# Patient Record
Sex: Female | Born: 1935 | Race: Black or African American | Hispanic: No | Smoking: Former smoker
Health system: Southern US, Community
[De-identification: ages and names within clinical notes are randomized; demographics above are authoritative.]

## PROBLEM LIST (undated history)

## (undated) DIAGNOSIS — I509 Heart failure, unspecified: Secondary | ICD-10-CM

## (undated) DIAGNOSIS — J449 Chronic obstructive pulmonary disease, unspecified: Secondary | ICD-10-CM

## (undated) DIAGNOSIS — I2510 Atherosclerotic heart disease of native coronary artery without angina pectoris: Secondary | ICD-10-CM

## (undated) DIAGNOSIS — N289 Disorder of kidney and ureter, unspecified: Secondary | ICD-10-CM

## (undated) DIAGNOSIS — I639 Cerebral infarction, unspecified: Secondary | ICD-10-CM

## (undated) DIAGNOSIS — I519 Heart disease, unspecified: Secondary | ICD-10-CM

## (undated) DIAGNOSIS — I10 Essential (primary) hypertension: Secondary | ICD-10-CM

## (undated) DIAGNOSIS — E119 Type 2 diabetes mellitus without complications: Secondary | ICD-10-CM

## (undated) HISTORY — PX: TONSILLECTOMY: SUR1361

## (undated) HISTORY — PX: ABDOMINAL HYSTERECTOMY: SHX81

## (undated) HISTORY — PX: CHOLECYSTECTOMY: SHX55

---

## 2012-08-10 ENCOUNTER — Emergency Department (HOSPITAL_COMMUNITY): Payer: Medicare HMO

## 2012-08-10 ENCOUNTER — Emergency Department (HOSPITAL_COMMUNITY)
Admission: EM | Admit: 2012-08-10 | Discharge: 2012-08-10 | Disposition: A | Discharge status: Home / Self Care | Payer: Medicare HMO | Source: Home / Self Care | Attending: Emergency Medicine | Admitting: Emergency Medicine

## 2012-08-10 ENCOUNTER — Encounter (HOSPITAL_COMMUNITY): Payer: Self-pay | Admitting: Emergency Medicine

## 2012-08-10 DIAGNOSIS — S12000A Unspecified displaced fracture of first cervical vertebra, initial encounter for closed fracture: Secondary | ICD-10-CM | POA: Insufficient documentation

## 2012-08-10 DIAGNOSIS — Z7982 Long term (current) use of aspirin: Secondary | ICD-10-CM | POA: Insufficient documentation

## 2012-08-10 DIAGNOSIS — IMO0002 Reserved for concepts with insufficient information to code with codable children: Secondary | ICD-10-CM | POA: Insufficient documentation

## 2012-08-10 DIAGNOSIS — I509 Heart failure, unspecified: Secondary | ICD-10-CM | POA: Insufficient documentation

## 2012-08-10 DIAGNOSIS — Y9289 Other specified places as the place of occurrence of the external cause: Secondary | ICD-10-CM | POA: Insufficient documentation

## 2012-08-10 DIAGNOSIS — J4489 Other specified chronic obstructive pulmonary disease: Secondary | ICD-10-CM | POA: Insufficient documentation

## 2012-08-10 DIAGNOSIS — I2510 Atherosclerotic heart disease of native coronary artery without angina pectoris: Secondary | ICD-10-CM | POA: Insufficient documentation

## 2012-08-10 DIAGNOSIS — Z794 Long term (current) use of insulin: Secondary | ICD-10-CM | POA: Insufficient documentation

## 2012-08-10 DIAGNOSIS — E119 Type 2 diabetes mellitus without complications: Secondary | ICD-10-CM | POA: Insufficient documentation

## 2012-08-10 DIAGNOSIS — S63502A Unspecified sprain of left wrist, initial encounter: Secondary | ICD-10-CM

## 2012-08-10 DIAGNOSIS — Y9389 Activity, other specified: Secondary | ICD-10-CM | POA: Insufficient documentation

## 2012-08-10 DIAGNOSIS — I10 Essential (primary) hypertension: Secondary | ICD-10-CM | POA: Insufficient documentation

## 2012-08-10 DIAGNOSIS — S63509A Unspecified sprain of unspecified wrist, initial encounter: Secondary | ICD-10-CM | POA: Insufficient documentation

## 2012-08-10 DIAGNOSIS — S63501A Unspecified sprain of right wrist, initial encounter: Secondary | ICD-10-CM

## 2012-08-10 DIAGNOSIS — Z8673 Personal history of transient ischemic attack (TIA), and cerebral infarction without residual deficits: Secondary | ICD-10-CM | POA: Insufficient documentation

## 2012-08-10 DIAGNOSIS — W19XXXA Unspecified fall, initial encounter: Secondary | ICD-10-CM

## 2012-08-10 DIAGNOSIS — S0081XA Abrasion of other part of head, initial encounter: Secondary | ICD-10-CM

## 2012-08-10 DIAGNOSIS — N289 Disorder of kidney and ureter, unspecified: Secondary | ICD-10-CM | POA: Insufficient documentation

## 2012-08-10 DIAGNOSIS — R296 Repeated falls: Secondary | ICD-10-CM | POA: Insufficient documentation

## 2012-08-10 DIAGNOSIS — Z87891 Personal history of nicotine dependence: Secondary | ICD-10-CM | POA: Insufficient documentation

## 2012-08-10 DIAGNOSIS — Z8679 Personal history of other diseases of the circulatory system: Secondary | ICD-10-CM | POA: Insufficient documentation

## 2012-08-10 DIAGNOSIS — J449 Chronic obstructive pulmonary disease, unspecified: Secondary | ICD-10-CM | POA: Insufficient documentation

## 2012-08-10 MED ORDER — HYDROCODONE-ACETAMINOPHEN 5-325 MG PO TABS: 0.5000 | ORAL_TABLET | Freq: Four times a day (QID) | ORAL | Status: DC | PRN

## 2012-08-10 MED ORDER — SODIUM CHLORIDE 0.9 % IV SOLN
Freq: Once | INTRAVENOUS | Status: AC
  Administered 2012-08-10: 22:00:00 via INTRAVENOUS

## 2012-08-10 MED ORDER — FENTANYL CITRATE 0.05 MG/ML IJ SOLN
50.0000 ug | Freq: Once | INTRAMUSCULAR | Status: AC
  Administered 2012-08-10: 50 ug via NASAL
  Filled 2012-08-10: qty 2

## 2012-08-10 MED ORDER — MORPHINE SULFATE 4 MG/ML IJ SOLN
4.0000 mg | Freq: Once | INTRAMUSCULAR | Status: AC
  Administered 2012-08-10: 4 mg via INTRAVENOUS
  Filled 2012-08-10: qty 1

## 2012-08-10 NOTE — ED Provider Notes (Signed)
History     CSN: [id number]  Arrival date & time 08/10/12  2123   First MD Initiated Contact with Patient 08/10/12 2127      Chief Complaint  Patient presents with  . Fall  . Head Injury  . Hand Pain    (Consider location/radiation/quality/duration/timing/severity/associated sxs/prior treatment) Patient is a 79 y.o. female presenting with fall, head injury, and hand pain.  Fall  Head Injury Hand Pain   Pt brought to the ED via EMS after unwitnessed fall in grocery store parking lot. Family at bedside states she went to put the cart back and fell down. Family found her on the ground, face down. She denies any LOC, complaining of severe bilateral hand pain, worse with movement. Also sustained abrasions to nose, wearing C-collar from EMS.  History reviewed. No pertinent past medical history.  History reviewed. No pertinent past surgical history.  History reviewed. No pertinent family history.  History  Substance Use Topics  . Smoking status: Not on file  . Smokeless tobacco: Not on file  . Alcohol Use: Not on file    OB History   Grav Para Term Preterm Abortions TAB SAB Ect Mult Living                  Review of Systems All other systems reviewed and are negative except as noted in HPI.   Allergies  Review of patient's allergies indicates no known allergies.  Home Medications  No current outpatient prescriptions on file.  BP 224/84  Pulse 117  Temp(Src) 98.1 F (36.7 C) (Oral)  Resp 20  SpO2 100%  Physical Exam  Nursing note and vitals reviewed. Constitutional: She is oriented to person, place, and time. She appears well-developed and well-nourished.  HENT:  Head: Normocephalic.  Abrasions to face  Eyes: EOM are normal. Pupils are equal, round, and reactive to light.  Neck:  In poorly fitting C-collar, replaced with philadelphia collar, no midline tenderness  Cardiovascular: Normal rate, normal heart sounds and intact distal pulses.    Pulmonary/Chest: Effort normal and breath sounds normal.  Abdominal: Bowel sounds are normal. She exhibits no distension. There is no tenderness.  Musculoskeletal: She exhibits tenderness (diffuse hand and wrist tenderness bilaterally, no swelling, ecchymosis or deformity, decreased ROM due to pain). She exhibits no edema.  Neurological: She is alert and oriented to person, place, and time. She has normal strength. No cranial nerve deficit or sensory deficit.  Skin: Skin is warm and dry. No rash noted.  Psychiatric: She has a normal mood and affect.    ED Course  Procedures (including critical care time)  Labs Reviewed  GLUCOSE, CAPILLARY - Abnormal; Notable for the following:    Glucose-Capillary 206 (*)    All other components within normal limits   Dg Wrist Complete Left  08/10/2012   *RADIOLOGY REPORT*  Clinical Data: Status post fall; bilateral wrist and hand pain.  LEFT WRIST - COMPLETE 3+ VIEW  Comparison: None.  Findings: There is no evidence of fracture or dislocation.  The carpal rows are intact, and demonstrate normal alignment.  The joint spaces are preserved.  Mild scattered vascular calcifications are seen.  IMPRESSION:  1.  No evidence of fracture or dislocation. 2.  Mild scattered vascular calcifications seen.   Original Report Authenticated By: Tonia Ghent, M.D.   Dg Wrist Complete Right  08/10/2012   *RADIOLOGY REPORT*  Clinical Data: Status post fall; bilateral wrist and hand pain.  RIGHT WRIST - COMPLETE 3+ VIEW  Comparison:  None.  Findings: There is no evidence of acute fracture or dislocation. There appears to be a chronic fracture involving the distal waist of the scaphoid, with some degree of associated bony remodelling.  The carpal rows are intact, and demonstrate normal alignment.  The joint spaces are preserved.  Mild negative ulnar variance is noted. There is mild diffuse osteopenia of visualized osseous structures.  There is minimal calcification of the triangular  fibrocartilage. Scattered mild vascular calcifications are seen.  IMPRESSION:  1.  No evidence of acute fracture or dislocation. 2.  Apparent chronic fracture involving the distal waist of the scaphoid, with some degree of associated bony remodelling. 2.  Mild diffuse osteopenia of visualized osseous structures. 3.  Scattered mild vascular calcifications seen.   Original Report Authenticated By: Tonia Ghent, M.D.   Ct Head Wo Contrast  08/10/2012   *RADIOLOGY REPORT*  Clinical Data:  Status post fall; facial injury, with abrasion at the nose.  Concern for head or cervical spine injury.  CT HEAD WITHOUT CONTRAST AND CT CERVICAL SPINE WITHOUT CONTRAST  Technique:  Multidetector CT imaging of the head and cervical spine was performed following the standard protocol without intravenous contrast.  Multiplanar CT image reconstructions of the cervical spine were also generated.  Comparison: None  CT HEAD  Findings: There is no evidence of acute infarction, mass lesion, or intra- or extra-axial hemorrhage on CT.  Mild periventricular white matter change likely reflects small vessel ischemic microangiopathy.  The posterior fossa, including the cerebellum, brainstem and fourth ventricle, is within normal limits.  The third and lateral ventricles, and basal ganglia are unremarkable in appearance.  The cerebral hemispheres are symmetric in appearance, with normal gray- white differentiation.  No mass effect or midline shift is seen.  There is no evidence of fracture; visualized osseous structures are unremarkable in appearance.  The orbits are within normal limits. The paranasal sinuses and mastoid air cells are well-aerated. Prominent soft tissue swelling is noted at the nose.  IMPRESSION:  1.  No evidence of traumatic intracranial injury or fracture. 2.  Prominent soft tissue swelling noted at the nose. 3.  Mild small vessel ischemic microangiopathy.  CT CERVICAL SPINE  Findings: There appears to be an essentially  nondisplaced fracture through the anterior arch of C1, directly anterior to the dens. This is of indeterminate age.  There is no additional evidence of fracture.  Vertebral bodies demonstrate normal height and alignment.  Mild multilevel disc space narrowing is noted throughout the cervical and upper thoracic spine, with scattered associated anterior and posterior disc osteophyte complexes, and mild underlying facet disease. Prevertebral soft tissues are within normal limits.  The thyroid gland is unremarkable in appearance.  Mild emphysema is noted at the lung apices.  Calcification is noted at the carotid bifurcations bilaterally, slightly more prominent on the right.  IMPRESSION:  1.  Essentially nondisplaced fracture through the anterior arch of C1, directly anterior to the dens.  This is of indeterminate age. 2.  No additional evidence of fracture along the cervical spine. 3.  Mild diffuse degenerative change noted along the cervical spine. 4.  Mild emphysema at the lung apices. 5.  Calcification at the carotid bifurcations bilaterally, slightly more prominent on the right.  Carotid ultrasound could be considered for further evaluation, when and as deemed clinically appropriate.  These results were called by telephone on 08/10/2012 at 10:15 p.m. to Dr. Susy Frizzle, who verbally acknowledged these results.   Original Report Authenticated By: Tonia Ghent, M.D.  Ct Cervical Spine Wo Contrast  08/10/2012   *RADIOLOGY REPORT*  Clinical Data:  Status post fall; facial injury, with abrasion at the nose.  Concern for head or cervical spine injury.  CT HEAD WITHOUT CONTRAST AND CT CERVICAL SPINE WITHOUT CONTRAST  Technique:  Multidetector CT imaging of the head and cervical spine was performed following the standard protocol without intravenous contrast.  Multiplanar CT image reconstructions of the cervical spine were also generated.  Comparison: None  CT HEAD  Findings: There is no evidence of acute  infarction, mass lesion, or intra- or extra-axial hemorrhage on CT.  Mild periventricular white matter change likely reflects small vessel ischemic microangiopathy.  The posterior fossa, including the cerebellum, brainstem and fourth ventricle, is within normal limits.  The third and lateral ventricles, and basal ganglia are unremarkable in appearance.  The cerebral hemispheres are symmetric in appearance, with normal gray- white differentiation.  No mass effect or midline shift is seen.  There is no evidence of fracture; visualized osseous structures are unremarkable in appearance.  The orbits are within normal limits. The paranasal sinuses and mastoid air cells are well-aerated. Prominent soft tissue swelling is noted at the nose.  IMPRESSION:  1.  No evidence of traumatic intracranial injury or fracture. 2.  Prominent soft tissue swelling noted at the nose. 3.  Mild small vessel ischemic microangiopathy.  CT CERVICAL SPINE  Findings: There appears to be an essentially nondisplaced fracture through the anterior arch of C1, directly anterior to the dens. This is of indeterminate age.  There is no additional evidence of fracture.  Vertebral bodies demonstrate normal height and alignment.  Mild multilevel disc space narrowing is noted throughout the cervical and upper thoracic spine, with scattered associated anterior and posterior disc osteophyte complexes, and mild underlying facet disease. Prevertebral soft tissues are within normal limits.  The thyroid gland is unremarkable in appearance.  Mild emphysema is noted at the lung apices.  Calcification is noted at the carotid bifurcations bilaterally, slightly more prominent on the right.  IMPRESSION:  1.  Essentially nondisplaced fracture through the anterior arch of C1, directly anterior to the dens.  This is of indeterminate age. 2.  No additional evidence of fracture along the cervical spine. 3.  Mild diffuse degenerative change noted along the cervical spine. 4.   Mild emphysema at the lung apices. 5.  Calcification at the carotid bifurcations bilaterally, slightly more prominent on the right.  Carotid ultrasound could be considered for further evaluation, when and as deemed clinically appropriate.  These results were called by telephone on 08/10/2012 at 10:15 p.m. to Dr. Susy Frizzle, who verbally acknowledged these results.   Original Report Authenticated By: Tonia Ghent, M.D.   Dg Hand Complete Left  08/10/2012   *RADIOLOGY REPORT*  Clinical Data: Status post fall; bilateral hand pain.  LEFT HAND - COMPLETE 3+ VIEW  Comparison: None.  Findings: There is no evidence of fracture or dislocation.  Mild joint space narrowing is suggested at the distal interphalangeal joints, with osteophyte formation, likely reflecting mild osteoarthritis.  There is mild diffuse osteopenia of visualized osseous structures.  Mild vascular calcifications are noted.  The carpal rows are intact, and demonstrate normal alignment.  IMPRESSION:  1.  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 No evidence of fracture or dislocation. 2.  Mild vascular calcifications seen.   Original Report Authenticated By: Tonia Ghent, M.D.   Dg Hand Complete Right  08/10/2012   *RADIOLOGY REPORT*  Clinical Data: Status post fall;  bilateral wrist and hand pain.  RIGHT HAND - COMPLETE 3+ VIEW  Comparison: None.  Findings: There is no evidence of acute fracture or dislocation. An apparent chronic fracture involving the distal waist of the scaphoid is better characterized on concurrent wrist radiographs. Visualized joint spaces are grossly preserved.  Mild negative ulnar variance is noted.  Mild scattered vascular calcifications are seen.  IMPRESSION:  1.  No evidence of acute fracture or dislocation. 2.  Apparent chronic fracture involving the distal waist of the scaphoid is better characterized on concurrent wrist radiographs. 3.  Mild scattered vascular calcifications seen.   Original  Report Authenticated By: Tonia Ghent, M.D.     1. Fall, initial encounter   2. Abrasion of face, initial encounter   3. C1 cervical fracture, closed, initial encounter   4. Wrist sprain, left, initial encounter   5. Wrist sprain, right, initial encounter       MDM  Reviewed imaging including C-spine findings. Discussed with Dr. Wynetta Emery on call for Neurosurgery who has also reviewed her films and recommends Aspen collar and follow up in the office. She does not have any known history of falls or spine injury. Pt placed in wrist splints for hand/wrist pain, although xrays neg for acute fracture. Plan to ambulate the patient and if tolerates walking will d/c home with family, pain meds PRN and outpatient followup.        Long Brimage B. Bernette Mayers, MD 08/10/12 [phone]

## 2012-08-10 NOTE — ED Notes (Signed)
Pt ambulated well in the hall with one-person stand-by assist.

## 2012-08-10 NOTE — ED Notes (Signed)
ZOX:[id number]<AV> Expected date:<BR> Expected time:<BR> Means of arrival:<BR> Comments:<BR> EMS 74F, Fall, Face and bilateral hand pain

## 2012-08-10 NOTE — ED Notes (Signed)
Brought in by EMS from a grocery store after her fall tonight.  Per EMS, pt was ambulating to get a grocery cart with left arm on a "cane", pt tripped over and fell forward, sustaining injury to face-- has abrasion to nose, bleeding controlled; pt also c/o right and left hand pain; pt presents to ED with c-collar on and yelling/groaning in pain; pt has had no loss of consciousness with this fall; denied dizziness.

## 2012-08-11 ENCOUNTER — Encounter (HOSPITAL_COMMUNITY): Payer: Self-pay | Admitting: Nurse Practitioner

## 2012-08-11 ENCOUNTER — Emergency Department (HOSPITAL_COMMUNITY): Payer: Medicare HMO

## 2012-08-11 ENCOUNTER — Inpatient Hospital Stay (HOSPITAL_COMMUNITY)
Admission: EM | Admit: 2012-08-11 | Discharge: 2012-08-16 | DRG: 551 | Disposition: A | Discharge status: Home Health | Payer: Medicare HMO | Attending: Family Medicine | Admitting: Family Medicine

## 2012-08-11 DIAGNOSIS — Z794 Long term (current) use of insulin: Secondary | ICD-10-CM

## 2012-08-11 DIAGNOSIS — Z9115 Patient's noncompliance with renal dialysis: Secondary | ICD-10-CM

## 2012-08-11 DIAGNOSIS — W19XXXA Unspecified fall, initial encounter: Secondary | ICD-10-CM

## 2012-08-11 DIAGNOSIS — D72829 Elevated white blood cell count, unspecified: Secondary | ICD-10-CM

## 2012-08-11 DIAGNOSIS — S12000A Unspecified displaced fracture of first cervical vertebra, initial encounter for closed fracture: Principal | ICD-10-CM

## 2012-08-11 DIAGNOSIS — Z8249 Family history of ischemic heart disease and other diseases of the circulatory system: Secondary | ICD-10-CM

## 2012-08-11 DIAGNOSIS — D631 Anemia in chronic kidney disease: Secondary | ICD-10-CM | POA: Diagnosis present

## 2012-08-11 DIAGNOSIS — M79609 Pain in unspecified limb: Secondary | ICD-10-CM

## 2012-08-11 DIAGNOSIS — Z833 Family history of diabetes mellitus: Secondary | ICD-10-CM

## 2012-08-11 DIAGNOSIS — Z87891 Personal history of nicotine dependence: Secondary | ICD-10-CM

## 2012-08-11 DIAGNOSIS — E871 Hypo-osmolality and hyponatremia: Secondary | ICD-10-CM | POA: Diagnosis present

## 2012-08-11 DIAGNOSIS — R937 Abnormal findings on diagnostic imaging of other parts of musculoskeletal system: Secondary | ICD-10-CM | POA: Diagnosis present

## 2012-08-11 DIAGNOSIS — K5900 Constipation, unspecified: Secondary | ICD-10-CM | POA: Diagnosis present

## 2012-08-11 DIAGNOSIS — R0902 Hypoxemia: Secondary | ICD-10-CM | POA: Diagnosis present

## 2012-08-11 DIAGNOSIS — N2581 Secondary hyperparathyroidism of renal origin: Secondary | ICD-10-CM | POA: Diagnosis present

## 2012-08-11 DIAGNOSIS — J9600 Acute respiratory failure, unspecified whether with hypoxia or hypercapnia: Secondary | ICD-10-CM | POA: Diagnosis present

## 2012-08-11 DIAGNOSIS — I120 Hypertensive chronic kidney disease with stage 5 chronic kidney disease or end stage renal disease: Secondary | ICD-10-CM | POA: Diagnosis present

## 2012-08-11 DIAGNOSIS — I5022 Chronic systolic (congestive) heart failure: Secondary | ICD-10-CM | POA: Diagnosis present

## 2012-08-11 DIAGNOSIS — J4489 Other specified chronic obstructive pulmonary disease: Secondary | ICD-10-CM | POA: Diagnosis present

## 2012-08-11 DIAGNOSIS — Z91158 Patient's noncompliance with renal dialysis for other reason: Secondary | ICD-10-CM

## 2012-08-11 DIAGNOSIS — I10 Essential (primary) hypertension: Secondary | ICD-10-CM | POA: Diagnosis present

## 2012-08-11 DIAGNOSIS — Z8673 Personal history of transient ischemic attack (TIA), and cerebral infarction without residual deficits: Secondary | ICD-10-CM

## 2012-08-11 DIAGNOSIS — J449 Chronic obstructive pulmonary disease, unspecified: Secondary | ICD-10-CM | POA: Diagnosis present

## 2012-08-11 DIAGNOSIS — M79642 Pain in left hand: Secondary | ICD-10-CM

## 2012-08-11 DIAGNOSIS — E663 Overweight: Secondary | ICD-10-CM | POA: Diagnosis present

## 2012-08-11 DIAGNOSIS — Z823 Family history of stroke: Secondary | ICD-10-CM

## 2012-08-11 DIAGNOSIS — N186 End stage renal disease: Secondary | ICD-10-CM | POA: Diagnosis present

## 2012-08-11 DIAGNOSIS — Z9119 Patient's noncompliance with other medical treatment and regimen: Secondary | ICD-10-CM

## 2012-08-11 DIAGNOSIS — E1129 Type 2 diabetes mellitus with other diabetic kidney complication: Secondary | ICD-10-CM | POA: Diagnosis present

## 2012-08-11 DIAGNOSIS — I509 Heart failure, unspecified: Secondary | ICD-10-CM | POA: Diagnosis present

## 2012-08-11 DIAGNOSIS — I2510 Atherosclerotic heart disease of native coronary artery without angina pectoris: Secondary | ICD-10-CM | POA: Diagnosis present

## 2012-08-11 DIAGNOSIS — E785 Hyperlipidemia, unspecified: Secondary | ICD-10-CM | POA: Diagnosis present

## 2012-08-11 DIAGNOSIS — D649 Anemia, unspecified: Secondary | ICD-10-CM

## 2012-08-11 DIAGNOSIS — Z79899 Other long term (current) drug therapy: Secondary | ICD-10-CM

## 2012-08-11 DIAGNOSIS — E875 Hyperkalemia: Secondary | ICD-10-CM | POA: Diagnosis present

## 2012-08-11 DIAGNOSIS — Z841 Family history of disorders of kidney and ureter: Secondary | ICD-10-CM

## 2012-08-11 DIAGNOSIS — N039 Chronic nephritic syndrome with unspecified morphologic changes: Secondary | ICD-10-CM | POA: Diagnosis present

## 2012-08-11 DIAGNOSIS — G959 Disease of spinal cord, unspecified: Secondary | ICD-10-CM

## 2012-08-11 DIAGNOSIS — Z91199 Patient's noncompliance with other medical treatment and regimen due to unspecified reason: Secondary | ICD-10-CM

## 2012-08-11 DIAGNOSIS — J189 Pneumonia, unspecified organism: Secondary | ICD-10-CM | POA: Diagnosis present

## 2012-08-11 HISTORY — DX: Atherosclerotic heart disease of native coronary artery without angina pectoris: I25.10

## 2012-08-11 HISTORY — DX: Heart disease, unspecified: I51.9

## 2012-08-11 HISTORY — DX: Chronic obstructive pulmonary disease, unspecified: J44.9

## 2012-08-11 HISTORY — DX: Disorder of kidney and ureter, unspecified: N28.9

## 2012-08-11 HISTORY — DX: Cerebral infarction, unspecified: I63.9

## 2012-08-11 HISTORY — DX: Heart failure, unspecified: I50.9

## 2012-08-11 HISTORY — DX: Essential (primary) hypertension: I10

## 2012-08-11 HISTORY — DX: Type 2 diabetes mellitus without complications: E11.9

## 2012-08-11 LAB — URINALYSIS, ROUTINE W REFLEX MICROSCOPIC
Leukocytes, UA: NEGATIVE
Nitrite: NEGATIVE
Specific Gravity, Urine: 1.013 (ref 1.005–1.030)
pH: 7.5 (ref 5.0–8.0)

## 2012-08-11 LAB — URINE MICROSCOPIC-ADD ON

## 2012-08-11 LAB — CBC WITH DIFFERENTIAL/PLATELET
Eosinophils Relative: 1 % (ref 0–5)
HCT: 26.9 % — ABNORMAL LOW (ref 36.0–46.0)
Hemoglobin: 8.7 g/dL — ABNORMAL LOW (ref 12.0–15.0)
Lymphocytes Relative: 14 % (ref 12–46)
Lymphs Abs: 1.9 10*3/uL (ref 0.7–4.0)
MCV: 104.3 fL — ABNORMAL HIGH (ref 78.0–100.0)
Monocytes Absolute: 0.4 10*3/uL (ref 0.1–1.0)
RBC: 2.58 MIL/uL — ABNORMAL LOW (ref 3.87–5.11)
WBC: 13.4 10*3/uL — ABNORMAL HIGH (ref 4.0–10.5)

## 2012-08-11 LAB — COMPREHENSIVE METABOLIC PANEL
Albumin: 3.1 g/dL — ABNORMAL LOW (ref 3.5–5.2)
BUN: 42 mg/dL — ABNORMAL HIGH (ref 6–23)
Creatinine, Ser: 8.61 mg/dL — ABNORMAL HIGH (ref 0.50–1.10)
Potassium: 4.3 mEq/L (ref 3.5–5.1)
Total Protein: 7.2 g/dL (ref 6.0–8.3)

## 2012-08-11 LAB — GLUCOSE, CAPILLARY: Glucose-Capillary: 141 mg/dL — ABNORMAL HIGH (ref 70–99)

## 2012-08-11 MED ORDER — MORPHINE SULFATE 2 MG/ML IJ SOLN
2.0000 mg | INTRAMUSCULAR | Status: DC | PRN
  Administered 2012-08-12: 2 mg via INTRAVENOUS
  Filled 2012-08-11: qty 1

## 2012-08-11 MED ORDER — CLONIDINE HCL 0.2 MG PO TABS
0.2000 mg | ORAL_TABLET | Freq: Two times a day (BID) | ORAL | Status: DC
  Administered 2012-08-12 – 2012-08-16 (×10): 0.2 mg via ORAL
  Filled 2012-08-11 (×11): qty 1

## 2012-08-11 MED ORDER — LEVOFLOXACIN IN D5W 500 MG/100ML IV SOLN
500.0000 mg | INTRAVENOUS | Status: AC
  Administered 2012-08-11: 500 mg via INTRAVENOUS
  Filled 2012-08-11: qty 100

## 2012-08-11 MED ORDER — GUAIFENESIN ER 600 MG PO TB12
600.0000 mg | ORAL_TABLET | Freq: Two times a day (BID) | ORAL | Status: DC
  Administered 2012-08-12 – 2012-08-16 (×9): 600 mg via ORAL
  Filled 2012-08-11 (×10): qty 1

## 2012-08-11 MED ORDER — SODIUM CHLORIDE 0.9 % IJ SOLN: 3.0000 mL | INTRAMUSCULAR | Status: DC | PRN

## 2012-08-11 MED ORDER — SODIUM CHLORIDE 0.9 % IJ SOLN
3.0000 mL | Freq: Two times a day (BID) | INTRAMUSCULAR | Status: DC
  Administered 2012-08-12 – 2012-08-16 (×6): 3 mL via INTRAVENOUS

## 2012-08-11 MED ORDER — SODIUM CHLORIDE 0.9 % IV SOLN: 250.0000 mL | INTRAVENOUS | Status: DC | PRN

## 2012-08-11 MED ORDER — OXYCODONE-ACETAMINOPHEN 5-325 MG PO TABS
1.0000 | ORAL_TABLET | Freq: Once | ORAL | Status: AC
  Administered 2012-08-11: 1 via ORAL
  Filled 2012-08-11: qty 1

## 2012-08-11 MED ORDER — ONDANSETRON 4 MG PO TBDP
4.0000 mg | ORAL_TABLET | Freq: Once | ORAL | Status: AC
Start: 2012-08-11 — End: 2012-08-11
  Administered 2012-08-11: 4 mg via ORAL
  Filled 2012-08-11: qty 1

## 2012-08-11 MED ORDER — DEXTROSE 5 % IV SOLN: 1.0000 g | Freq: Three times a day (TID) | INTRAVENOUS | Status: DC

## 2012-08-11 MED ORDER — PANTOPRAZOLE SODIUM 40 MG PO TBEC
40.0000 mg | DELAYED_RELEASE_TABLET | Freq: Every day | ORAL | Status: DC
  Administered 2012-08-12 – 2012-08-16 (×5): 40 mg via ORAL
  Filled 2012-08-11 (×5): qty 1

## 2012-08-11 MED ORDER — FUROSEMIDE 80 MG PO TABS
80.0000 mg | ORAL_TABLET | Freq: Two times a day (BID) | ORAL | Status: DC
  Administered 2012-08-12 – 2012-08-15 (×6): 80 mg via ORAL
  Filled 2012-08-11 (×9): qty 1

## 2012-08-11 MED ORDER — FENTANYL CITRATE 0.05 MG/ML IJ SOLN
50.0000 ug | Freq: Once | INTRAMUSCULAR | Status: AC
  Administered 2012-08-11: 50 ug via INTRAVENOUS
  Filled 2012-08-11: qty 2

## 2012-08-11 MED ORDER — AMLODIPINE BESYLATE 5 MG PO TABS
5.0000 mg | ORAL_TABLET | Freq: Every day | ORAL | Status: DC
  Administered 2012-08-12 – 2012-08-16 (×5): 5 mg via ORAL
  Filled 2012-08-11 (×5): qty 1

## 2012-08-11 MED ORDER — INSULIN ASPART 100 UNIT/ML ~~LOC~~ SOLN
0.0000 [IU] | SUBCUTANEOUS | Status: DC
  Administered 2012-08-12: 2 [IU] via SUBCUTANEOUS

## 2012-08-11 MED ORDER — CALCIUM ACETATE 667 MG PO CAPS
1334.0000 mg | ORAL_CAPSULE | Freq: Every day | ORAL | Status: DC
  Administered 2012-08-12 – 2012-08-15 (×4): 1334 mg via ORAL
  Filled 2012-08-11 (×6): qty 2

## 2012-08-11 MED ORDER — SIMVASTATIN 20 MG PO TABS
20.0000 mg | ORAL_TABLET | Freq: Every day | ORAL | Status: DC
  Administered 2012-08-12 – 2012-08-15 (×4): 20 mg via ORAL
  Filled 2012-08-11 (×5): qty 1

## 2012-08-11 MED ORDER — ACETAMINOPHEN 500 MG PO TABS
500.0000 mg | ORAL_TABLET | Freq: Four times a day (QID) | ORAL | Status: DC | PRN
  Filled 2012-08-11: qty 1

## 2012-08-11 MED ORDER — INSULIN GLARGINE 100 UNIT/ML ~~LOC~~ SOLN
16.0000 [IU] | Freq: Every day | SUBCUTANEOUS | Status: DC
  Administered 2012-08-12 – 2012-08-15 (×5): 16 [IU] via SUBCUTANEOUS
  Filled 2012-08-11 (×6): qty 0.16

## 2012-08-11 MED ORDER — IPRATROPIUM BROMIDE 0.02 % IN SOLN: 0.5000 mg | Freq: Four times a day (QID) | RESPIRATORY_TRACT | Status: DC

## 2012-08-11 MED ORDER — ALBUTEROL SULFATE (5 MG/ML) 0.5% IN NEBU
5.0000 mg | INHALATION_SOLUTION | Freq: Once | RESPIRATORY_TRACT | Status: AC
  Administered 2012-08-11: 5 mg via RESPIRATORY_TRACT
  Filled 2012-08-11: qty 1

## 2012-08-11 MED ORDER — TRAMADOL HCL 50 MG PO TABS
50.0000 mg | ORAL_TABLET | Freq: Three times a day (TID) | ORAL | Status: DC | PRN
  Administered 2012-08-12 – 2012-08-16 (×4): 50 mg via ORAL
  Filled 2012-08-11 (×5): qty 1

## 2012-08-11 MED ORDER — GADOBENATE DIMEGLUMINE 529 MG/ML IV SOLN: 15.0000 mL | Freq: Once | INTRAVENOUS | Status: DC

## 2012-08-11 MED ORDER — METHYLPREDNISOLONE SODIUM SUCC 125 MG IJ SOLR
125.0000 mg | Freq: Once | INTRAMUSCULAR | Status: AC
  Administered 2012-08-11: 125 mg via INTRAVENOUS
  Filled 2012-08-11: qty 2

## 2012-08-11 MED ORDER — ALBUTEROL SULFATE (5 MG/ML) 0.5% IN NEBU: 2.5000 mg | INHALATION_SOLUTION | RESPIRATORY_TRACT | Status: DC | PRN

## 2012-08-11 MED ORDER — ASPIRIN EC 81 MG PO TBEC
81.0000 mg | DELAYED_RELEASE_TABLET | Freq: Every day | ORAL | Status: DC
  Administered 2012-08-12 – 2012-08-16 (×5): 81 mg via ORAL
  Filled 2012-08-11 (×5): qty 1

## 2012-08-11 NOTE — ED Notes (Signed)
Called report to unit 3000.

## 2012-08-11 NOTE — ED Notes (Signed)
Pt nonn-compliant to stand for orthostatics.  Lying and sitting were performed

## 2012-08-11 NOTE — ED Notes (Signed)
Pt was seen last night at [facility] after a fall, diagnosed with bilateral wrist sprains and fx neck and discharged home with tramadol. Family states pt was in severe pain throughout the night last night and pain was unrelieved with tramadol. Family states pt was unable to even take care of herself today d/t pain

## 2012-08-11 NOTE — ED Notes (Signed)
Patient has a c-collar in place, abrasion to her nose and upper lip, bilateral wrist braces in place.

## 2012-08-11 NOTE — ED Notes (Signed)
Maxine Glenn (daughter) - [phone]

## 2012-08-11 NOTE — ED Notes (Addendum)
Pt also missed dialysis today. Family states dialysis could not take her "in so much pain." dialysis center did say they will see her later today or tomorrow am

## 2012-08-11 NOTE — H&P (Signed)
PCP: Joana Reamer, MD  [facility] - renal dr. Mauri Reading  Chief Complaint:  Hand pain  HPI: Nicole Cox is a 79 y.o. female   has a past medical history of Renal disorder; Diabetes mellitus without complication; Hypertension; Heart disease; COPD (chronic obstructive pulmonary disease); Stroke; CHF (congestive heart failure); and Coronary artery disease.   Presented with  Yesterday patient had a fall. Denies LOS, she fell on her face and tried to brace her self with her arms. Patient denies feeling lightheaded not sure why she fell. Denies tripping. She was brought to [facility] and was found to have CI fracture this was called in to NS who recommended follow up as an outpatient. Her hands were imaged yesterday and no evidence of fracture was found. Patient was sent home with ultram but could not sleep because of pain in her hands and face. She usually has some tingling in hands bilaterally but this pain is different and more severe.she states everything hurts even minor touch to her hands causes severe pain. Patient states that her thumbs are the most painful.  She felt so bad that she did not go to her HD. Normally she gets dialyzed Tuesday,Thursday and Saturday at [facility] but family wishes for her care to be transferred here. She still makes urine. When patient came in to [facility] ED today she had further work up including MRI of C-spine and thoracic spine. C-spine did not show any evidence of cord compression but MRI of thoracic spine was worrisome for a lesion at the level of T4-T5 possibly secondary to chronic ischemiaacute infarct or malignancy less likely. NS consultation has been called by ER patient was supposed to follow up as an outpatient.  Neurology consultation has been called and patient was supposed to be also followed up as an outpatient. She was incidentally found to be hypoxic down to 80% and with increased wheezing. She was given IV steroids and nebulizes with good results. But continued  to be hypoxic, CXR was worrisome for possible PNA vs atypical edema.   She denies any cough or fever, no chest pain. She does endorse feeling shortness of breath that is at baseline.  Denies any weight loss. Family unsure when was her last mammogram. She may have had a colonoscopy with in past 10 years.   Review of Systems:    Pertinent positives include: shortness of breath at rest. dyspnea on exertion, non-productive cough,  Constitutional:  No weight loss, night sweats, Fevers, chills, fatigue, weight loss  HEENT:  No headaches, Difficulty swallowing,Tooth/dental problems,Sore throat,  No sneezing, itching, ear ache, nasal congestion, post nasal drip,  Cardio-vascular:  No chest pain, Orthopnea, PND, anasarca, dizziness, palpitations.no Bilateral lower extremity swelling  GI:  No heartburn, indigestion, abdominal pain, nausea, vomiting, diarrhea, change in bowel habits, loss of appetite, melena, blood in stool, hematemesis Resp:  No excess mucus  No coughing up of blood.No change in color of mucus.No wheezing. Skin:  no rash or lesions. No jaundice GU:  no dysuria, change in color of urine, no urgency or frequency. No straining to urinate.  No flank pain.  Musculoskeletal:  No joint pain or no joint swelling. No decreased range of motion. No back pain.  Psych:  No change in mood or affect. No depression or anxiety. No memory loss.  Neuro: no localizing neurological complaints, no tingling, no weakness, no double vision, no gait abnormality, no slurred speech, no confusion  Otherwise ROS are negative except for above, 10 systems were reviewed  Past Medical History: Past Medical History  Diagnosis Date  . Renal disorder   . Diabetes mellitus without complication   . Hypertension   . Heart disease   . COPD (chronic obstructive pulmonary disease)   . Stroke   . CHF (congestive heart failure)   . Coronary artery disease    Past Surgical History  Procedure Laterality Date   . Abdominal hysterectomy    . Cholecystectomy    . Tonsillectomy       Medications: Prior to Admission medications   Medication Sig Start Date End Date Taking? Authorizing Provider  acetaminophen (TYLENOL) 500 MG tablet Take 500 mg by mouth every 6 (six) hours as needed for pain.   Yes Historical Provider, MD  amLODipine (NORVASC) 5 MG tablet Take 5 mg by mouth daily.   Yes Historical Provider, MD  aspirin EC 81 MG tablet Take 81 mg by mouth daily.   Yes Historical Provider, MD  calcium acetate (PHOSLO) 667 MG capsule Take 1,334 mg by mouth daily.   Yes Historical Provider, MD  cloNIDine (CATAPRES) 0.2 MG tablet Take 0.2 mg by mouth 2 (two) times daily.   Yes Historical Provider, MD  furosemide (LASIX) 80 MG tablet Take 80 mg by mouth 2 (two) times daily. One every other day   Yes Historical Provider, MD  insulin aspart (NOVOLOG) 100 UNIT/ML injection Inject 6 Units into the skin 3 (three) times daily with meals.   Yes Historical Provider, MD  insulin glargine (LANTUS) 100 UNIT/ML injection Inject 16 Units into the skin at bedtime.   Yes Historical Provider, MD  pravastatin (PRAVACHOL) 40 MG tablet Take 40 mg by mouth daily.   Yes Historical Provider, MD  PRESCRIPTION MEDICATION Inhale 2 puffs into the lungs daily as needed (inhailer).   Yes Historical Provider, MD  traMADol (ULTRAM) 50 MG tablet Take 50 mg by mouth every 8 (eight) hours as needed for pain.    Yes Historical Provider, MD  Vitamin D, Ergocalciferol, (DRISDOL) 50000 UNITS CAPS Take 50,000 Units by mouth daily.   Yes Historical Provider, MD    Allergies:  No Known Allergies  Social History:  Ambulatory with  [facility]   Home alone but now lives in [place] closer to her daughter.    reports that she has quit smoking. She does not have any smokeless tobacco history on file. She reports that she does not drink alcohol or use illicit drugs.   Family History: family history includes Diabetes in her sister; Heart  disease in her father, mother, and sisters; Hypertension in her daughter, father, mother, and sisters; Kidney disease in her daughter and sisters; and Stroke in her sister.    Physical Exam: Patient Vitals for the past 24 hrs:  BP Temp Temp src Pulse Resp SpO2  08/11/12 2015 167/67 mmHg - - 96 - 100 %  08/11/12 2000 168/65 mmHg - - 101 - 100 %  08/11/12 1945 182/70 mmHg - - 103 - 100 %  08/11/12 1930 166/70 mmHg - - 96 - 100 %  08/11/12 1915 189/67 mmHg - - 95 - 99 %  08/11/12 1900 177/70 mmHg - - 96 - 99 %  08/11/12 1845 187/65 mmHg - - 94 - 97 %  08/11/12 1830 174/65 mmHg - - 99 - 86 %  08/11/12 1815 167/69 mmHg - - 95 - 93 %  08/11/12 1800 146/79 mmHg - - 87 - 100 %  08/11/12 1745 152/56 mmHg - - 87 - 100 %  08/11/12 1730 147/62 mmHg - - 88 - 99 %  08/11/12 1715 144/57 mmHg - - 87 - 100 %  08/11/12 1700 147/59 mmHg - - 85 - 100 %  08/11/12 1645 127/63 mmHg - - 84 - 99 %  08/11/12 1630 150/66 mmHg - - 80 - 99 %  08/11/12 1615 134/56 mmHg - - 84 - 100 %  08/11/12 1551 - - - - - 99 %  08/11/12 1545 140/56 mmHg - - 81 - 99 %  08/11/12 1530 148/50 mmHg - - 83 - 100 %  08/11/12 1529 153/51 mmHg - - 95 16 -  08/11/12 1528 165/55 mmHg - - 92 - -  08/11/12 1527 153/51 mmHg - - 95 - -  08/11/12 1230 149/60 mmHg - - 85 15 89 %  08/11/12 1215 152/59 mmHg - - 88 - 97 %  08/11/12 1200 168/56 mmHg - - 87 - 99 %  08/11/12 1145 152/56 mmHg - - 80 - 93 %  08/11/12 1130 168/63 mmHg - - 87 - 96 %  08/11/12 1122 169/54 mmHg 98 F (36.7 C) Oral 89 16 93 %  08/11/12 1038 170/59 mmHg 98.2 F (36.8 C) Oral 93 17 91 %    1. General:  in No Acute distress 2. Psychological: Alert and  Oriented 3. Head/ENT:   Moist   Mucous Membranes                          Head Non traumatic, neck supple                          Normal  Dentition 4. SKIN: normal  Skin turgor,  Skin clean Dry and intact no rash 5. Heart: Regular rate and rhythm no Murmur, Rub or gallop 6. Lungs: Clear to auscultation  bilaterally, no wheezes or crackles   7. Abdomen: Soft, non-tender, Non distended 8. Lower extremities: no clubbing, cyanosis, or edema 9. Neurologically: cranial nerves II through XII intact strength appears to be intact but the patient states she has severe pain at any but he touches her hands. She does move both hands spontaneously. 10. MSK: Normal range of motion  body mass index is unknown because there is no height or weight on file.   Labs on Admission:   Recent Labs  08/11/12 1508  NA 135  K 4.3  CL 94*  CO2 26  GLUCOSE 111*  BUN 42*  CREATININE 8.61*  CALCIUM 9.6    Recent Labs  08/11/12 1508  AST 29  ALT 28  ALKPHOS 87  BILITOT 0.3  PROT 7.2  ALBUMIN 3.1*   No results found for this basename: LIPASE, AMYLASE,  in the last 72 hours  Recent Labs  08/11/12 1836  WBC 13.4*  NEUTROABS 10.8*  HGB 8.7*  HCT 26.9*  MCV 104.3*  PLT 280   No results found for this basename: CKTOTAL, CKMB, CKMBINDEX, TROPONINI,  in the last 72 hours No results found for this basename: TSH, T4TOTAL, FREET3, T3FREE, THYROIDAB,  in the last 72 hours No results found for this basename: VITAMINB12, FOLATE, FERRITIN, TIBC, IRON, RETICCTPCT,  in the last 72 hours No results found for this basename: HGBA1C    CrCl is unknown because there is no height on file for the current visit. ABG No results found for this basename: phart, pco2, po2, hco3, tco2, acidbasedef, o2sat     No results found  for this basename: DDIMER     Other results:  I have pearsonaly reviewed this: ECG REPORT  Rate: 89  Rhythm: NSR with PAC ST&T Change: no ischemic changes  UA no evidence of infection   Cultures: No results found for this basename: sdes, specrequest, cult, reptstatus       Radiological Exams on Admission: Dg Wrist Complete Left  08/10/2012   *RADIOLOGY REPORT*  Clinical Data: Status post fall; bilateral wrist and hand pain.  LEFT WRIST - COMPLETE 3+ VIEW  Comparison: None.   Findings: There is no evidence of fracture or dislocation.  The carpal rows are intact, and demonstrate normal alignment.  The joint spaces are preserved.  Mild scattered vascular calcifications are seen.  IMPRESSION:  1.  No evidence of fracture or dislocation. 2.  Mild scattered vascular calcifications seen.   Original Report Authenticated By: Tonia Ghent, M.D.   Dg Wrist Complete Right  08/10/2012   *RADIOLOGY REPORT*  Clinical Data: Status post fall; bilateral wrist and hand pain.  RIGHT WRIST - COMPLETE 3+ VIEW  Comparison: None.  Findings: There is no evidence of acute fracture or dislocation. There appears to be a chronic fracture involving the distal waist of the scaphoid, with some degree of associated bony remodelling.  The carpal rows are intact, and demonstrate normal alignment.  The joint spaces are preserved.  Mild negative ulnar variance is noted. There is mild diffuse osteopenia of visualized osseous structures.  There is minimal calcification of the triangular fibrocartilage. Scattered mild vascular calcifications are seen.  IMPRESSION:  1.  No evidence of acute fracture or dislocation. 2.  Apparent chronic fracture involving the distal waist of the scaphoid, with some degree of associated bony remodelling. 2.  Mild diffuse osteopenia of visualized osseous structures. 3.  Scattered mild vascular calcifications seen.   Original Report Authenticated By: Tonia Ghent, M.D.   Ct Head Wo Contrast  08/10/2012   *RADIOLOGY REPORT*  Clinical Data:  Status post fall; facial injury, with abrasion at the nose.  Concern for head or cervical spine injury.  CT HEAD WITHOUT CONTRAST AND CT CERVICAL SPINE WITHOUT CONTRAST  Technique:  Multidetector CT imaging of the head and cervical spine was performed following the standard protocol without intravenous contrast.  Multiplanar CT image reconstructions of the cervical spine were also generated.  Comparison: None  CT HEAD  Findings: There is no evidence of  acute infarction, mass lesion, or intra- or extra-axial hemorrhage on CT.  Mild periventricular white matter change likely reflects small vessel ischemic microangiopathy.  The posterior fossa, including the cerebellum, brainstem and fourth ventricle, is within normal limits.  The third and lateral ventricles, and basal ganglia are unremarkable in appearance.  The cerebral hemispheres are symmetric in appearance, with normal gray- white differentiation.  No mass effect or midline shift is seen.  There is no evidence of fracture; visualized osseous structures are unremarkable in appearance.  The orbits are within normal limits. The paranasal sinuses and mastoid air cells are well-aerated. Prominent soft tissue swelling is noted at the nose.  IMPRESSION:  1.  No evidence of traumatic intracranial injury or fracture. 2.  Prominent soft tissue swelling noted at the nose. 3.  Mild small vessel ischemic microangiopathy.  CT CERVICAL SPINE  Findings: There appears to be an essentially nondisplaced fracture through the anterior arch of C1, directly anterior to the dens. This is of indeterminate age.  There is no additional evidence of fracture.  Vertebral bodies demonstrate normal height and alignment.  Mild multilevel disc space narrowing is noted throughout the cervical and upper thoracic spine, with scattered associated anterior and posterior disc osteophyte complexes, and mild underlying facet disease. Prevertebral soft tissues are within normal limits.  The thyroid gland is unremarkable in appearance.  Mild emphysema is noted at the lung apices.  Calcification is noted at the carotid bifurcations bilaterally, slightly more prominent on the right.  IMPRESSION:  1.  Essentially nondisplaced fracture through the anterior arch of C1, directly anterior to the dens.  This is of indeterminate age. 2.  No additional evidence of fracture along the cervical spine. 3.  Mild diffuse degenerative change noted along the cervical  spine. 4.  Mild emphysema at the lung apices. 5.  Calcification at the carotid bifurcations bilaterally, slightly more prominent on the right.  Carotid ultrasound could be considered for further evaluation, when and as deemed clinically appropriate.  These results were called by telephone on 08/10/2012 at 10:15 p.m. to Dr. Susy Frizzle, who verbally acknowledged these results.   Original Report Authenticated By: Tonia Ghent, M.D.   Ct Cervical Spine Wo Contrast  08/10/2012   *RADIOLOGY REPORT*  Clinical Data:  Status post fall; facial injury, with abrasion at the nose.  Concern for head or cervical spine injury.  CT HEAD WITHOUT CONTRAST AND CT CERVICAL SPINE WITHOUT CONTRAST  Technique:  Multidetector CT imaging of the head and cervical spine was performed following the standard protocol without intravenous contrast.  Multiplanar CT image reconstructions of the cervical spine were also generated.  Comparison: None  CT HEAD  Findings: There is no evidence of acute infarction, mass lesion, or intra- or extra-axial hemorrhage on CT.  Mild periventricular white matter change likely reflects small vessel ischemic microangiopathy.  The posterior fossa, including the cerebellum, brainstem and fourth ventricle, is within normal limits.  The third and lateral ventricles, and basal ganglia are unremarkable in appearance.  The cerebral hemispheres are symmetric in appearance, with normal gray- white differentiation.  No mass effect or midline shift is seen.  There is no evidence of fracture; visualized osseous structures are unremarkable in appearance.  The orbits are within normal limits. The paranasal sinuses and mastoid air cells are well-aerated. Prominent soft tissue swelling is noted at the nose.  IMPRESSION:  1.  No evidence of traumatic intracranial injury or fracture. 2.  Prominent soft tissue swelling noted at the nose. 3.  Mild small vessel ischemic microangiopathy.  CT CERVICAL SPINE  Findings: There  appears to be an essentially nondisplaced fracture through the anterior arch of C1, directly anterior to the dens. This is of indeterminate age.  There is no additional evidence of fracture.  Vertebral bodies demonstrate normal height and alignment.  Mild multilevel disc space narrowing is noted throughout the cervical and upper thoracic spine, with scattered associated anterior and posterior disc osteophyte complexes, and mild underlying facet disease. Prevertebral soft tissues are within normal limits.  The thyroid gland is unremarkable in appearance.  Mild emphysema is noted at the lung apices.  Calcification is noted at the carotid bifurcations bilaterally, slightly more prominent on the right.  IMPRESSION:  1.  Essentially nondisplaced fracture through the anterior arch of C1, directly anterior to the dens.  This is of indeterminate age. 2.  No additional evidence of fracture along the cervical spine. 3.  Mild diffuse degenerative change noted along the cervical spine. 4.  Mild emphysema at the lung apices. 5.  Calcification at the carotid bifurcations bilaterally, slightly more prominent on the right.  Carotid ultrasound could be considered for further evaluation, when and as deemed clinically appropriate.  These results were called by telephone on 08/10/2012 at 10:15 p.m. to Dr. Susy Frizzle, who verbally acknowledged these results.   Original Report Authenticated By: Tonia Ghent, M.D.   Mr Cervical Spine Wo Contrast  08/11/2012   *RADIOLOGY REPORT*  Clinical Data:  Fall.  Bilateral arm and hand pain.  Dialysis patient.  MRI CERVICAL AND THORACIC SPINE WITHOUT CONTRAST  Technique:  Multiplanar and multiecho pulse sequences of the cervical spine, to include the craniocervical junction and cervicothoracic junction, and the thoracic spine, were obtained without intravenous contrast.  Comparison:  CT cervical spine 08/10/2012  MRI CERVICAL SPINE  Findings:  Negative for fracture of the cervical spine.  No  mass or edema.  Bone marrow is heterogeneous without focal mass lesion. This is most compatible  with chronic anemia and dialysis.  There is edema of the thoracic spinal cord therefore the thoracic spine was also scanned.  C2-3:  Mild disc degeneration.  C3-4:  Moderate disc degeneration and spondylosis.  There is moderate spinal stenosis and bilateral foraminal encroachment.  C4-5:  Disc degeneration and spondylosis with moderate to severe spinal stenosis.  There is foraminal stenosis bilaterally.  C5-6:  Disc degeneration and spondylosis with moderate spinal stenosis and foraminal encroachment bilaterally.  C6-7:  Disc degeneration and spondylosis with mild spinal stenosis  C7-T1:  Moderate disc degeneration without significant spinal stenosis.  There is left foraminal narrowing due to spurring.  IMPRESSION: Advanced cervical spondylosis with multilevel spinal stenosis, most severe at C3-4, C4-5, and C5-6.  Abnormal signal in the thoracic spinal cord, see separate thoracic MRI report.  MRI THORACIC SPINE  Findings: Abnormality within the spinal cord at the T4-5 level. There is a focal area of increased signal within the cord at the T4 level   measuring one vertebral height in  length.  Just below this is a focal area of narrowing of the cord suggesting atrophy.  No syrinx.  Contrast was not   administered due to dialysis.  No cord compression.  No vertebral mass or fracture is seen in this area.  Multilevel thoracic disc degeneration and spondylosis diffusely in the thoracic spine.  This is not causing significant spinal stenosis.  No acute disc protrusion.  Small pleural effusions bilaterally.  IMPRESSION: Abnormal spinal cord at the T4-5 level.  This could be due to chronic ischemia.  Acute infarct considered less likely. Underlying neoplastic process such as metastatic disease considered unlikely.  Primary cord tumor not considered likely.  Acute or chronic transverse myelitis also a consideration.   Original  Report Authenticated By: Janeece Riggers, M.D.   Mr Thoracic Spine Wo Contrast  08/11/2012   *RADIOLOGY REPORT*  Clinical Data:  Fall.  Bilateral arm and hand pain.  Dialysis patient.  MRI CERVICAL AND THORACIC SPINE WITHOUT CONTRAST  Technique:  Multiplanar and multiecho pulse sequences of the cervical spine, to include the craniocervical junction and cervicothoracic junction, and the thoracic spine, were obtained without intravenous contrast.  Comparison:  CT cervical spine 08/10/2012  MRI CERVICAL SPINE  Findings:  Negative for fracture of the cervical spine.  No mass or edema.  Bone marrow is heterogeneous without focal mass lesion. This is most compatible  with chronic anemia and dialysis.  There is edema of the thoracic spinal cord therefore the thoracic spine was also scanned.  C2-3:  Mild disc degeneration.  C3-4:  Moderate disc degeneration and spondylosis.  There is moderate  spinal stenosis and bilateral foraminal encroachment.  C4-5:  Disc degeneration and spondylosis with moderate to severe spinal stenosis.  There is foraminal stenosis bilaterally.  C5-6:  Disc degeneration and spondylosis with moderate spinal stenosis and foraminal encroachment bilaterally.  C6-7:  Disc degeneration and spondylosis with mild spinal stenosis  C7-T1:  Moderate disc degeneration without significant spinal stenosis.  There is left foraminal narrowing due to spurring.  IMPRESSION: Advanced cervical spondylosis with multilevel spinal stenosis, most severe at C3-4, C4-5, and C5-6.  Abnormal signal in the thoracic spinal cord, see separate thoracic MRI report.  MRI THORACIC SPINE  Findings: Abnormality within the spinal cord at the T4-5 level. There is a focal area of increased signal within the cord at the T4 level   measuring one vertebral height in  length.  Just below this is a focal area of narrowing of the cord suggesting atrophy.  No syrinx.  Contrast was not   administered due to dialysis.  No cord compression.  No  vertebral mass or fracture is seen in this area.  Multilevel thoracic disc degeneration and spondylosis diffusely in the thoracic spine.  This is not causing significant spinal stenosis.  No acute disc protrusion.  Small pleural effusions bilaterally.  IMPRESSION: Abnormal spinal cord at the T4-5 level.  This could be due to chronic ischemia.  Acute infarct considered less likely. Underlying neoplastic process such as metastatic disease considered unlikely.  Primary cord tumor not considered likely.  Acute or chronic transverse myelitis also a consideration.   Original Report Authenticated By: Janeece Riggers, M.D.   Dg Chest Port 1 View  08/11/2012   *RADIOLOGY REPORT*  Clinical Data: Wheezing.  COPD.  Diabetes.  PORTABLE CHEST - 1 VIEW  Comparison: None.  Findings: Moderate cardiomegaly noted with bilateral indistinctness of pulmonary vasculature, interstitial accentuation, and patchy airspace opacity at the right lung base.  Bilateral small pleural effusions are present, right greater than left.  Thoracic spondylosis noted.  Degenerative glenohumeral arthropathy observed bilaterally. .  IMPRESSION: 1.  Cardiomegaly with interstitial accentuation and patchy airspace opacity at the right lung base.  Appearance favors asymmetric edema although edema with superimposed right basilar pneumonia is not excluded.  2.  Small pleural effusions, right greater than left   Original Report Authenticated By: Gaylyn Rong, M.D.   Dg Hand Complete Left  08/10/2012   *RADIOLOGY REPORT*  Clinical Data: Status post fall; bilateral hand pain.  LEFT HAND - COMPLETE 3+ VIEW  Comparison: None.  Findings: There is no evidence of fracture or dislocation.  Mild joint space narrowing is suggested at the distal interphalangeal joints, with osteophyte formation, likely reflecting mild osteoarthritis.  There is mild diffuse osteopenia of visualized osseous structures.  Mild vascular calcifications are noted.  The carpal rows are intact,  and demonstrate normal alignment.  IMPRESSION:  1.  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 No evidence of fracture or dislocation. 2.  Mild vascular calcifications seen.   Original Report Authenticated By: Tonia Ghent, M.D.   Dg Hand Complete Right  08/10/2012   *RADIOLOGY REPORT*  Clinical Data: Status post fall; bilateral wrist and hand pain.  RIGHT HAND - COMPLETE 3+ VIEW  Comparison: None.  Findings: There is no evidence of acute fracture or dislocation. An apparent chronic fracture involving the distal waist of the scaphoid is better characterized on concurrent wrist radiographs. Visualized joint spaces are grossly preserved.  Mild negative ulnar variance is noted.  Mild scattered vascular calcifications are seen.  IMPRESSION:  1.  No evidence of acute fracture or dislocation. 2.  Apparent chronic fracture involving the distal waist of the scaphoid is better characterized on concurrent wrist radiographs. 3.  Mild scattered vascular calcifications seen.   Original Report Authenticated By: Tonia Ghent, M.D.    Chart has been reviewed  Assessment/Plan  79 year old female with history of end-stage renal disease who had had a fall yesterday C1 fracture in ER was found to be hypoxic with evidence of possible pneumonia on  chest x-ray.  Present on Admission:  . HCAP (healthcare-associated pneumonia) - will initiate IV antibiotics admit as an inpatient. Blood cultures if  becomes febrile. Marland Kitchen Hypoxia - likely multifactorial patient had not had had had hemodialysis as she was supposed to. Also had initial wheezing and a history of COPD now resolved. Also has abnormal chest x-ray suspicious for pneumonia. Will continue on oxygen . ESRD (end stage renal disease) - Will need to consult renal to inform the patient is here. She'll likely need dialysis tomorrow morning. Marland Kitchen Hypertension - continue home medications . Diabetes mellitus - continue sliding scale and insulin . Abnormal  MRI, thoracic spine - neurology has been notified. MRI is not consistent with an acute lesion but father workup will be needed. Marland Kitchen COPD (chronic obstructive pulmonary disease) - will continue nebulizer treatments, antibiotics, and Mucinex currently no evidence of wheezing we'll hold off on steroids as she may have active infectious disease. Marland Kitchen CAD (coronary artery disease) - denies any chest pain we'll continue home medications.   Prophylaxis: SCD, Protonix  CODE STATUS: FULL CODE  Other plan as per orders.  I have spent a total of 65 min on this admission  Wyn Nettle 08/11/2012, 8:21 PM

## 2012-08-11 NOTE — ED Notes (Addendum)
Patient was having difficulty keeping her oxygen sats above 90%. Placed patient on 100% NRB to get her O2 up. Placed patient on 4L [place] to maintain sats >95%. MD made aware.

## 2012-08-11 NOTE — ED Provider Notes (Signed)
History     CSN: [id number]  Arrival date & time 08/11/12  1017   First MD Initiated Contact with Patient 08/11/12 1114      Chief Complaint  Patient presents with  . Neck Pain    (Consider location/radiation/quality/duration/timing/severity/associated sxs/prior treatment) HPI Pt with fall yesterday from ground height diagnosed with c-spine fracture and bl wrist sprains. Sent home with ultram and aspen collar after consultation with Dr Wynetta Emery. Pt states she has had ongoing pain to bl hands coming in waves described as pins and needles. Pain is much worse in L hand. No swelling. No deformity. Pain is uncontrolled by ultram. Pt with night time bl hand paraesthesias before fall but pain worse since fall. Pt states she only has mild neg pain which is controlled. No new numbness or weakness. No leg weakness or swelling.  Past Medical History  Diagnosis Date  . Renal disorder   . Diabetes mellitus without complication   . Hypertension   . Heart disease   . COPD (chronic obstructive pulmonary disease)   . Stroke   . CHF (congestive heart failure)   . Coronary artery disease     Past Surgical History  Procedure Laterality Date  . Abdominal hysterectomy    . Cholecystectomy    . Tonsillectomy      Family History  Problem Relation Age of Onset  . Heart disease Mother   . Hypertension Mother   . Heart disease Father   . Hypertension Father   . Heart disease Sister   . Diabetes Sister   . Stroke Sister   . Kidney disease Sister   . Hypertension Sister   . Heart disease Sister   . Kidney disease Sister   . Hypertension Sister   . Kidney disease Daughter   . Hypertension Daughter     History  Substance Use Topics  . Smoking status: Former Games developer  . Smokeless tobacco: Not on file  . Alcohol Use: No    OB History   Grav Para Term Preterm Abortions TAB SAB Ect Mult Living                  Review of Systems  Constitutional: Negative for fever and chills.  HENT:  Positive for neck pain. Negative for neck stiffness.   Respiratory: Negative for shortness of breath.   Cardiovascular: Negative for chest pain.  Gastrointestinal: Negative for nausea, vomiting and abdominal pain.  Musculoskeletal: Positive for myalgias. Negative for back pain.  Skin: Negative for rash and wound.  Neurological: Negative for dizziness, syncope, weakness, light-headedness, numbness and headaches.  All other systems reviewed and are negative.    Allergies  Review of patient's allergies indicates no known allergies.  Home Medications   No current outpatient prescriptions on file.  BP 163/67  Pulse 72  Temp(Src) 98.8 F (37.1 C) (Oral)  Resp 18  Ht 5\' 3"  (1.6 m)  Wt 156 lb (70.761 kg)  BMI 27.64 kg/m2  SpO2 99%  Physical Exam  Nursing note and vitals reviewed. Constitutional: She is oriented to person, place, and time. She appears well-developed and well-nourished. No distress.  HENT:  Head: Normocephalic and atraumatic.  Mouth/Throat: Oropharynx is clear and moist.  Eyes: EOM are normal. Pupils are equal, round, and reactive to light.  Neck: Normal range of motion. Neck supple.  Pt in poorly fitted c-collar  Cardiovascular: Normal rate and regular rhythm.   Pulmonary/Chest: Effort normal and breath sounds normal. No respiratory distress. She has no wheezes. She  has no rales.  Abdominal: Soft. Bowel sounds are normal. She exhibits no distension and no mass. There is no tenderness. There is no rebound and no guarding.  Musculoskeletal: Normal range of motion. She exhibits tenderness (Tenderness to light palpation in Left hand. good cap refill. Sensation intact. No obvious swelling. bl wrist splints present). She exhibits no edema.  Neurological: She is alert and oriented to person, place, and time.  Difficult to assess bl hand strength due to pain. Sensation grossly intact.   Skin: Skin is warm and dry. No rash noted. No erythema.  Psychiatric: She has a normal  mood and affect. Her behavior is normal.    ED Course  Procedures (including critical care time)  Labs Reviewed  GLUCOSE, CAPILLARY - Abnormal; Notable for the following:    Glucose-Capillary 141 (*)    All other components within normal limits  URINALYSIS, ROUTINE W REFLEX MICROSCOPIC - Abnormal; Notable for the following:    APPearance CLOUDY (*)    Glucose, UA 100 (*)    Hgb urine dipstick TRACE (*)    Protein, ur >300 (*)    All other components within normal limits  COMPREHENSIVE METABOLIC PANEL - Abnormal; Notable for the following:    Chloride 94 (*)    Glucose, Bld 111 (*)    BUN 42 (*)    Creatinine, Ser 8.61 (*)    Albumin 3.1 (*)    GFR calc non Af Amer 4 (*)    GFR calc Af Amer 5 (*)    All other components within normal limits  CBC WITH DIFFERENTIAL - Abnormal; Notable for the following:    WBC 13.4 (*)    RBC 2.58 (*)    Hemoglobin 8.7 (*)    HCT 26.9 (*)    MCV 104.3 (*)    Neutrophils Relative % 81 (*)    Neutro Abs 10.8 (*)    All other components within normal limits  URINE MICROSCOPIC-ADD ON - Abnormal; Notable for the following:    Squamous Epithelial / LPF MANY (*)    Bacteria, UA FEW (*)    All other components within normal limits  GLUCOSE, CAPILLARY - Abnormal; Notable for the following:    Glucose-Capillary 178 (*)    All other components within normal limits  COMPREHENSIVE METABOLIC PANEL - Abnormal; Notable for the following:    Sodium 131 (*)    Potassium 5.7 (*)    Chloride 91 (*)    Glucose, Bld 247 (*)    BUN 53 (*)    Creatinine, Ser 9.16 (*)    Albumin 2.9 (*)    GFR calc non Af Amer 4 (*)    GFR calc Af Amer 4 (*)    All other components within normal limits  HEMOGLOBIN A1C - Abnormal; Notable for the following:    Hemoglobin A1C 6.4 (*)    Mean Plasma Glucose 137 (*)    All other components within normal limits  GLUCOSE, CAPILLARY - Abnormal; Notable for the following:    Glucose-Capillary 190 (*)    All other components  within normal limits  RENAL FUNCTION PANEL - Abnormal; Notable for the following:    Glucose, Bld 194 (*)    Creatinine, Ser 4.23 (*)    Albumin 2.9 (*)    GFR calc non Af Amer 9 (*)    GFR calc Af Amer 11 (*)    All other components within normal limits  CBC - Abnormal; Notable for the following:  WBC 14.4 (*)    RBC 2.38 (*)    Hemoglobin 8.1 (*)    HCT 25.5 (*)    MCV 107.1 (*)    RDW 15.6 (*)    All other components within normal limits  GLUCOSE, CAPILLARY - Abnormal; Notable for the following:    Glucose-Capillary 115 (*)    All other components within normal limits  GLUCOSE, CAPILLARY - Abnormal; Notable for the following:    Glucose-Capillary 200 (*)    All other components within normal limits  GLUCOSE, CAPILLARY - Abnormal; Notable for the following:    Glucose-Capillary 127 (*)    All other components within normal limits  GLUCOSE, CAPILLARY - Abnormal; Notable for the following:    Glucose-Capillary 132 (*)    All other components within normal limits  RENAL FUNCTION PANEL - Abnormal; Notable for the following:    Sodium 134 (*)    Glucose, Bld 164 (*)    BUN 40 (*)    Creatinine, Ser 6.43 (*)    Albumin 2.9 (*)    GFR calc non Af Amer 6 (*)    GFR calc Af Amer 6 (*)    All other components within normal limits  GLUCOSE, CAPILLARY - Abnormal; Notable for the following:    Glucose-Capillary 166 (*)    All other components within normal limits  GLUCOSE, CAPILLARY - Abnormal; Notable for the following:    Glucose-Capillary 124 (*)    All other components within normal limits  GLUCOSE, CAPILLARY - Abnormal; Notable for the following:    Glucose-Capillary 132 (*)    All other components within normal limits  GLUCOSE, CAPILLARY - Abnormal; Notable for the following:    Glucose-Capillary 140 (*)    All other components within normal limits  FERRITIN - Abnormal; Notable for the following:    Ferritin 1534 (*)    All other components within normal limits  IRON  AND TIBC - Abnormal; Notable for the following:    TIBC 244 (*)    All other components within normal limits  GLUCOSE, CAPILLARY - Abnormal; Notable for the following:    Glucose-Capillary 168 (*)    All other components within normal limits  CBC - Abnormal; Notable for the following:    WBC 11.7 (*)    RBC 2.53 (*)    Hemoglobin 8.6 (*)    HCT 26.4 (*)    MCV 104.3 (*)    All other components within normal limits  RENAL FUNCTION PANEL - Abnormal; Notable for the following:    Sodium 133 (*)    Chloride 95 (*)    Glucose, Bld 170 (*)    BUN 47 (*)    Creatinine, Ser 7.28 (*)    Albumin 2.9 (*)    GFR calc non Af Amer 5 (*)    GFR calc Af Amer 6 (*)    All other components within normal limits  GLUCOSE, CAPILLARY - Abnormal; Notable for the following:    Glucose-Capillary 204 (*)    All other components within normal limits  GLUCOSE, CAPILLARY - Abnormal; Notable for the following:    Glucose-Capillary 153 (*)    All other components within normal limits  GLUCOSE, CAPILLARY - Abnormal; Notable for the following:    Glucose-Capillary 147 (*)    All other components within normal limits  GLUCOSE, CAPILLARY - Abnormal; Notable for the following:    Glucose-Capillary 130 (*)    All other components within normal limits  GLUCOSE, CAPILLARY -  Abnormal; Notable for the following:    Glucose-Capillary 140 (*)    All other components within normal limits  CULTURE, BLOOD (ROUTINE X 2)  CULTURE, BLOOD (ROUTINE X 2)  CULTURE, EXPECTORATED SPUTUM-ASSESSMENT  GRAM STAIN  LEGIONELLA ANTIGEN, URINE  STREP PNEUMONIAE URINARY ANTIGEN  CBC WITH DIFFERENTIAL  RENAL FUNCTION PANEL   Dg Chest 2 View  08/14/2012   *RADIOLOGY REPORT*  Clinical Data: Status post dialysis.  Evaluate "infiltrates."  CHEST - 2 VIEW  Comparison: 08/12/2012  Findings: Patient rotated to the right.  Mild cardiomegaly. Probable small right pleural effusion, slightly increased. No pneumothorax.  Improved interstitial  edema/venous congestion. Decreased right base air space disease.  Possible right-sided pleural plaque posteriorly versus artifact on the lateral view.  IMPRESSION: Cardiomegaly with improvement in interstitial edema/mild venous congestion.  Improved right base air space disease, suspicious for infection. Recommend radiographic follow-up until clearing.  Probable increasing small right pleural effusion; cannot exclude a pleural plaque posteriorly on the lateral view. Recommend attention on follow-up.   Original Report Authenticated By: Jeronimo Greaves, M.D.     1. Hypoxia   2. Bilateral hand pain   3. Spinal cord lesion   4. Anemia   5. Leukocytosis   6. COPD (chronic obstructive pulmonary disease)   7. ESRD (end stage renal disease)      Date: 08/11/2012  Rate: 89  Rhythm: normal sinus rhythm  QRS Axis: normal  Intervals: normal  ST/T Wave abnormalities: nonspecific T wave changes  Conduction Disutrbances:none  Narrative Interpretation:   Old EKG Reviewed: none available Scattered PAC's   MDM   Pt with drop of O2 sats requiring [place] O2. On exam denied CP, SOB. Diffuse expiratory wheezing throughout  Pt still c/o episodic Bl hand pain.   Discussed MRI results with Dr Wynetta Emery. No acute neurosurgical intervention needed. Suggested consulting Neurology for thoracic spine finding.   Dr Amada Jupiter reviewed MR cervical and thoracic spine. No findings to account for patients symptoms. Suggest outpatient f/u with neurology for possible EMG. States symptoms may be peripheral neuropathy due to carpal tunnel disease  Pt continues to drop O2 sats off O2 despite neb. Given elevated WBC and possible infiltrate will cover for CAP. Will admit for persistent hypoxia.      Loren Racer, MD 08/15/12 2320

## 2012-08-12 ENCOUNTER — Inpatient Hospital Stay (HOSPITAL_COMMUNITY): Payer: Medicare HMO

## 2012-08-12 LAB — RENAL FUNCTION PANEL
Albumin: 2.9 g/dL — ABNORMAL LOW (ref 3.5–5.2)
BUN: 20 mg/dL (ref 6–23)
CO2: 28 mEq/L (ref 19–32)
Chloride: 96 mEq/L (ref 96–112)
Potassium: 4.4 mEq/L (ref 3.5–5.1)

## 2012-08-12 LAB — COMPREHENSIVE METABOLIC PANEL
ALT: 28 U/L (ref 0–35)
Alkaline Phosphatase: 88 U/L (ref 39–117)
CO2: 25 mEq/L (ref 19–32)
GFR calc Af Amer: 4 mL/min — ABNORMAL LOW (ref >90)
GFR calc non Af Amer: 4 mL/min — ABNORMAL LOW (ref >90)
Glucose, Bld: 247 mg/dL — ABNORMAL HIGH (ref 70–99)
Potassium: 5.7 mEq/L — ABNORMAL HIGH (ref 3.5–5.1)
Sodium: 131 mEq/L — ABNORMAL LOW (ref 135–145)

## 2012-08-12 LAB — GLUCOSE, CAPILLARY
Glucose-Capillary: 115 mg/dL — ABNORMAL HIGH (ref 70–99)
Glucose-Capillary: 200 mg/dL — ABNORMAL HIGH (ref 70–99)

## 2012-08-12 MED ORDER — VANCOMYCIN HCL IN DEXTROSE 750-5 MG/150ML-% IV SOLN
750.0000 mg | Freq: Once | INTRAVENOUS | Status: AC
  Administered 2012-08-12: 750 mg via INTRAVENOUS
  Filled 2012-08-12 (×2): qty 150

## 2012-08-12 MED ORDER — INSULIN ASPART 100 UNIT/ML ~~LOC~~ SOLN
0.0000 [IU] | Freq: Three times a day (TID) | SUBCUTANEOUS | Status: DC
  Administered 2012-08-13 – 2012-08-14 (×4): 1 [IU] via SUBCUTANEOUS
  Administered 2012-08-14 – 2012-08-15 (×2): 2 [IU] via SUBCUTANEOUS
  Administered 2012-08-15: 1 [IU] via SUBCUTANEOUS
  Administered 2012-08-15: 2 [IU] via SUBCUTANEOUS
  Administered 2012-08-16: 1 [IU] via SUBCUTANEOUS

## 2012-08-12 MED ORDER — CEFEPIME HCL 2 G IJ SOLR
2.0000 g | INTRAMUSCULAR | Status: DC
  Administered 2012-08-13: 2 g via INTRAVENOUS
  Filled 2012-08-12 (×2): qty 2

## 2012-08-12 MED ORDER — DARBEPOETIN ALFA-POLYSORBATE 40 MCG/0.4ML IJ SOLN
40.0000 ug | INTRAMUSCULAR | Status: DC
  Administered 2012-08-12: 40 ug via INTRAVENOUS
  Filled 2012-08-12: qty 0.4

## 2012-08-12 MED ORDER — RENA-VITE PO TABS
1.0000 | ORAL_TABLET | Freq: Every day | ORAL | Status: DC
  Administered 2012-08-12 – 2012-08-16 (×5): 1 via ORAL
  Filled 2012-08-12 (×5): qty 1

## 2012-08-12 MED ORDER — PENTAFLUOROPROP-TETRAFLUOROETH EX AERO: 1.0000 "application " | INHALATION_SPRAY | CUTANEOUS | Status: DC | PRN

## 2012-08-12 MED ORDER — IPRATROPIUM-ALBUTEROL 20-100 MCG/ACT IN AERS
2.0000 | INHALATION_SPRAY | Freq: Four times a day (QID) | RESPIRATORY_TRACT | Status: DC | PRN
  Filled 2012-08-12: qty 4

## 2012-08-12 MED ORDER — SODIUM CHLORIDE 0.9 % IV SOLN: 100.0000 mL | INTRAVENOUS | Status: DC | PRN

## 2012-08-12 MED ORDER — ALTEPLASE 2 MG IJ SOLR
2.0000 mg | Freq: Once | INTRAMUSCULAR | Status: DC | PRN
  Filled 2012-08-12: qty 2

## 2012-08-12 MED ORDER — NEPRO/CARBSTEADY PO LIQD
237.0000 mL | ORAL | Status: DC | PRN
  Filled 2012-08-12: qty 237

## 2012-08-12 MED ORDER — VANCOMYCIN HCL 10 G IV SOLR
1500.0000 mg | Freq: Once | INTRAVENOUS | Status: AC
  Administered 2012-08-12: 1500 mg via INTRAVENOUS
  Filled 2012-08-12: qty 1500

## 2012-08-12 MED ORDER — HEPARIN SODIUM (PORCINE) 1000 UNIT/ML DIALYSIS
1000.0000 [IU] | INTRAMUSCULAR | Status: DC | PRN
  Filled 2012-08-12: qty 1

## 2012-08-12 MED ORDER — LIDOCAINE-PRILOCAINE 2.5-2.5 % EX CREA: 1.0000 "application " | TOPICAL_CREAM | CUTANEOUS | Status: DC | PRN

## 2012-08-12 MED ORDER — VANCOMYCIN HCL IN DEXTROSE 750-5 MG/150ML-% IV SOLN
750.0000 mg | INTRAVENOUS | Status: DC
  Administered 2012-08-13: 750 mg via INTRAVENOUS
  Filled 2012-08-12 (×2): qty 150

## 2012-08-12 MED ORDER — DEXTROSE 5 % IV SOLN
2.0000 g | Freq: Once | INTRAVENOUS | Status: AC
  Administered 2012-08-12: 2 g via INTRAVENOUS
  Filled 2012-08-12: qty 2

## 2012-08-12 MED ORDER — LIDOCAINE HCL (PF) 1 % IJ SOLN
5.0000 mL | INTRAMUSCULAR | Status: DC | PRN
  Filled 2012-08-12: qty 5

## 2012-08-12 MED ORDER — OXYCODONE-ACETAMINOPHEN 5-325 MG PO TABS
1.0000 | ORAL_TABLET | Freq: Four times a day (QID) | ORAL | Status: DC | PRN
  Administered 2012-08-13 – 2012-08-15 (×6): 1 via ORAL
  Administered 2012-08-15 – 2012-08-16 (×3): 2 via ORAL
  Administered 2012-08-16: 1 via ORAL
  Filled 2012-08-12 (×2): qty 2
  Filled 2012-08-12 (×3): qty 1
  Filled 2012-08-12: qty 2
  Filled 2012-08-12 (×3): qty 1

## 2012-08-12 MED ORDER — HEPARIN SODIUM (PORCINE) 1000 UNIT/ML DIALYSIS
20.0000 [IU]/kg | INTRAMUSCULAR | Status: DC | PRN
  Administered 2012-08-12: 1500 [IU] via INTRAVENOUS_CENTRAL
  Filled 2012-08-12: qty 2

## 2012-08-12 NOTE — Consult Note (Signed)
[facility] Renal Consultation Note    Indication for Consultation:  Management of ESRD/hemodialysis; anemia, hypertension/volume and secondary hyperparathyroidism  HPI: Navneet Schmuck is a 79 y.o. female with ESRD secondary to diabetes on TTS dialysis at [facility]. She missed her usual treatment due to ED evaluation for a fall. [facility] ED evaluation showed C1 fx who recommended outpatient follow up. She had persistant hand pain/tingling with negative findings on xrays. She returned to the [facility] ED for further evaluation due to severe bilateral hand pain with MRI of c-spine and t-spine done which was negative for cervical fx but identified a spinal cord abnormality at T 4-5.  Presently, she states her right hand is not hurting as much as it did.  She describes the pain as shooting and yesterday she rated it at a 25 on a scale of 0-10.  She has SOB and occasional CP, but denies cough. She has a history of cramping and vomiting at times on dialysis. She still makes urine and is prone to constipation.  She is right handed and is able to feed herself.  Past Medical History  Diagnosis Date  . Renal disorder   . Diabetes mellitus without complication   . Hypertension   . Heart disease   . COPD (chronic obstructive pulmonary disease)   . Stroke   . CHF (congestive heart failure)   . Coronary artery disease    Past Surgical History  Procedure Laterality Date  . Abdominal hysterectomy    . Cholecystectomy    . Tonsillectomy     Family History  Problem Relation Age of Onset  . Heart disease Mother   . Hypertension Mother   . Heart disease Father   . Hypertension Father   . Heart disease Sister   . Diabetes Sister   . Stroke Sister   . Kidney disease Sister   . Hypertension Sister   . Heart disease Sister   . Kidney disease Sister   . Hypertension Sister   . Kidney disease Daughter   . Hypertension Daughter    Social History: Lives at home in [place] with  husband of 54 years, 6 adopted children; former day Occupational hygienist. Grew up in [place]. Finished 8th grade.  Quit smoking when she started dialysis 6-7 months ago. She does not have any smokeless tobacco history on file. She reports that she does not drink alcohol or use illicit drugs.  No Known Allergies Prior to Admission medications   Medication Sig Start Date End Date Taking? Authorizing Provider  acetaminophen (TYLENOL) 500 MG tablet Take 500 mg by mouth every 6 (six) hours as needed for pain.   Yes Historical Provider, MD  amLODipine (NORVASC) 5 MG tablet Take 5 mg by mouth daily.   Yes Historical Provider, MD  aspirin EC 81 MG tablet Take 81 mg by mouth daily.   Yes Historical Provider, MD  calcium acetate (PHOSLO) 667 MG capsule Take 1,334 mg by mouth daily.   Yes Historical Provider, MD  cloNIDine (CATAPRES) 0.2 MG tablet Take 0.2 mg by mouth 2 (two) times daily.   Yes Historical Provider, MD  furosemide (LASIX) 80 MG tablet Take 80 mg by mouth 2 (two) times daily. One every other day   Yes Historical Provider, MD  insulin aspart (NOVOLOG) 100 UNIT/ML injection Inject 6 Units into the skin 3 (three) times daily with meals.   Yes Historical Provider, MD  insulin glargine (LANTUS) 100 UNIT/ML injection Inject 16 Units into the skin at  bedtime.   Yes Historical Provider, MD  pravastatin (PRAVACHOL) 40 MG tablet Take 40 mg by mouth daily.   Yes Historical Provider, MD  PRESCRIPTION MEDICATION Inhale 2 puffs into the lungs daily as needed (inhailer).   Yes Historical Provider, MD  traMADol (ULTRAM) 50 MG tablet Take 50 mg by mouth every 8 (eight) hours as needed for pain.    Yes Historical Provider, MD  Vitamin D, Ergocalciferol, (DRISDOL) 50000 UNITS CAPS Take 50,000 Units by mouth daily.   Yes Historical Provider, MD   Current Facility-Administered Medications  Medication Dose Route Frequency Provider Last Rate Last Dose  . 0.9 %  sodium chloride infusion  250 mL Intravenous PRN  Therisa Doyne, MD      . 0.9 %  sodium chloride infusion  100 mL Intravenous PRN Zada Girt, MD      . 0.9 %  sodium chloride infusion  100 mL Intravenous PRN Zada Girt, MD      . acetaminophen (TYLENOL) tablet 500 mg  500 mg Oral Q6H PRN Therisa Doyne, MD      . albuterol (PROVENTIL) (5 MG/ML) 0.5% nebulizer solution 2.5 mg  2.5 mg Nebulization Q4H PRN Therisa Doyne, MD      . alteplase (CATHFLO ACTIVASE) injection 2 mg  2 mg Intracatheter Once PRN Zada Girt, MD      . amLODipine (NORVASC) tablet 5 mg  5 mg Oral Daily Therisa Doyne, MD      . aspirin EC tablet 81 mg  81 mg Oral Daily Therisa Doyne, MD      . calcium acetate (PHOSLO) capsule 1,334 mg  1,334 mg Oral Q breakfast Therisa Doyne, MD   1,334 mg at 08/12/12 0828  . [START ON 08/13/2012] ceFEPIme (MAXIPIME) 2 g in dextrose 5 % 50 mL IVPB  2 g Intravenous Q T,Th,Sa-HD Therisa Doyne, MD      . ceFEPIme (MAXIPIME) 2 g in dextrose 5 % 50 mL IVPB  2 g Intravenous Once Rhetta Mura, MD      . cloNIDine (CATAPRES) tablet 0.2 mg  0.2 mg Oral BID Therisa Doyne, MD   0.2 mg at 08/12/12 0942  . darbepoetin (ARANESP) injection 40 mcg  40 mcg Intravenous Q Fri-HD Zada Girt, MD   40 mcg at 08/12/12 1135  . feeding supplement (NEPRO CARB STEADY) liquid 237 mL  237 mL Oral PRN Zada Girt, MD      . furosemide (LASIX) tablet 80 mg  80 mg Oral BID Therisa Doyne, MD   80 mg at 08/12/12 0828  . guaiFENesin (MUCINEX) 12 hr tablet 600 mg  600 mg Oral BID Therisa Doyne, MD   600 mg at 08/12/12 0942  . heparin injection 1,000 Units  1,000 Units Dialysis PRN Zada Girt, MD      . heparin injection 1,500 Units  20 Units/kg Dialysis PRN Zada Girt, MD   1,500 Units at 08/12/12 1110  . insulin aspart (novoLOG) injection 0-9 Units  0-9 Units Subcutaneous Q4H Therisa Doyne, MD   2 Units at 08/12/12 0829  . insulin glargine (LANTUS) injection 16 Units  16 Units Subcutaneous QHS  Therisa Doyne, MD   16 Units at 08/12/12 0026  . Ipratropium-Albuterol (COMBIVENT) respimat 2 puff  2 puff Inhalation Q6H PRN Rhetta Mura, MD      . lidocaine (PF) (XYLOCAINE) 1 % injection 5 mL  5 mL Intradermal PRN Zada Girt, MD      . lidocaine-prilocaine (EMLA)  cream 1 application  1 application Topical PRN Zada Girt, MD      . morphine 2 MG/ML injection 2 mg  2 mg Intravenous Q4H PRN Therisa Doyne, MD   2 mg at 08/12/12 0506  . pantoprazole (PROTONIX) EC tablet 40 mg  40 mg Oral Q1200 Therisa Doyne, MD      . pentafluoroprop-tetrafluoroeth (GEBAUERS) aerosol 1 application  1 application Topical PRN Zada Girt, MD      . simvastatin (ZOCOR) tablet 20 mg  20 mg Oral q1800 Therisa Doyne, MD      . sodium chloride 0.9 % injection 3 mL  3 mL Intravenous Q12H Anastassia Doutova, MD      . sodium chloride 0.9 % injection 3 mL  3 mL Intravenous PRN Therisa Doyne, MD      . traMADol (ULTRAM) tablet 50 mg  50 mg Oral Q8H PRN Therisa Doyne, MD   50 mg at 08/12/12 0829  . [START ON 08/13/2012] vancomycin (VANCOCIN) IVPB 750 mg/150 ml premix  750 mg Intravenous Q T,Th,Sa-HD Therisa Doyne, MD      . vancomycin (VANCOCIN) IVPB 750 mg/150 ml premix  750 mg Intravenous Once Rhetta Mura, MD       Labs: Basic Metabolic Panel:  Recent Labs Lab 08/11/12 1508 08/12/12 0400  NA 135 131*  K 4.3 5.7*  CL 94* 91*  CO2 26 25  GLUCOSE 111* 247*  BUN 42* 53*  CREATININE 8.61* 9.16*  CALCIUM 9.6 9.8   Liver Function Tests:  Recent Labs Lab 08/11/12 1508 08/12/12 0400  AST 29 28  ALT 28 28  ALKPHOS 87 88  BILITOT 0.3 0.3  PROT 7.2 7.4  ALBUMIN 3.1* 2.9*  CBC:  Recent Labs Lab 08/11/12 1836  WBC 13.4*  NEUTROABS 10.8*  HGB 8.7*  HCT 26.9*  MCV 104.3*  PLT 280   CBG:  Recent Labs Lab 08/10/12 2140 08/11/12 1046 08/11/12 2156 08/12/12 0741  GLUCAP 206* 141* 178* 190*  Studies/Results:  Mr Thoracic and Cervical Spine Wo  Contrast  08/11/2012   *RADIOLOGY REPORT*  Clinical Data:  Fall.  Bilateral arm and hand pain.  Dialysis patient.  MRI CERVICAL AND THORACIC SPINE WITHOUT CONTRAST  Technique:  Multiplanar and multiecho pulse sequences of the cervical spine, to include the craniocervical junction and cervicothoracic junction, and the thoracic spine, were obtained without intravenous contrast.  Comparison:  CT cervical spine 08/10/2012  MRI CERVICAL SPINE  Findings:  Negative for fracture of the cervical spine.  No mass or edema.  Bone marrow is heterogeneous without focal mass lesion. This is most compatible  with chronic anemia and dialysis.  There is edema of the thoracic spinal cord therefore the thoracic spine was also scanned.  C2-3:  Mild disc degeneration.  C3-4:  Moderate disc degeneration and spondylosis.  There is moderate spinal stenosis and bilateral foraminal encroachment.  C4-5:  Disc degeneration and spondylosis with moderate to severe spinal stenosis.  There is foraminal stenosis bilaterally.  C5-6:  Disc degeneration and spondylosis with moderate spinal stenosis and foraminal encroachment bilaterally.  C6-7:  Disc degeneration and spondylosis with mild spinal stenosis  C7-T1:  Moderate disc degeneration without significant spinal stenosis.  There is left foraminal narrowing due to spurring.  IMPRESSION: Advanced cervical spondylosis with multilevel spinal stenosis, most severe at C3-4, C4-5, and C5-6.  Abnormal signal in the thoracic spinal cord, see separate thoracic MRI report.  MRI THORACIC SPINE  Findings: Abnormality within the spinal cord at the T4-5  level. There is a focal area of increased signal within the cord at the T4 level   measuring one vertebral height in  length.  Just below this is a focal area of narrowing of the cord suggesting atrophy.  No syrinx.  Contrast was not   administered due to dialysis.  No cord compression.  No vertebral mass or fracture is seen in this area.  Multilevel thoracic  disc degeneration and spondylosis diffusely in the thoracic spine.  This is not causing significant spinal stenosis.  No acute disc protrusion.  Small pleural effusions bilaterally.  IMPRESSION: Abnormal spinal cord at the T4-5 level.  This could be due to chronic ischemia.  Acute infarct considered less likely. Underlying neoplastic process such as metastatic disease considered unlikely.  Primary cord tumor not considered likely.  Acute or chronic transverse myelitis also a consideration.   Original Report Authenticated By: Janeece Riggers, M.D.   Dg Chest Port 1 View 08/11/2012   *RADIOLOGY REPORT*  Clinical Data: Wheezing.  COPD.  Diabetes.  PORTABLE CHEST - 1 VIEW  Comparison: None.  Marland Kitchen  IMPRESSION: 1.  Cardiomegaly with interstitial accentuation and patchy airspace opacity at the right lung base.  Appearance favors asymmetric edema although edema with superimposed right basilar pneumonia is not excluded.  2.  Small pleural effusions, right greater than left   Original Report Authenticated By: Gaylyn Rong, M.D.   Dg Hand Complete Left 08/10/2012   *RADIOLOGY REPORT*  Clinical Data: Status post fall; bilateral hand pain.  LEFT HAND - COMPLETE 3+ VIEW  IMPRESSION:  1.  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 No evidence of fracture or dislocation. 2.  Mild vascular calcifications seen.   Original Report Authenticated By: Tonia Ghent, M.D.   Dg Hand Complete Right 08/10/2012   *RADIOLOGY REPORT*  Clinical Data: Status post fall; bilateral wrist and hand pain.  RIGHT HAND - COMPLETE 3+ VIEW    IMPRESSION:  1.  No evidence of acute fracture or dislocation. 2.  Apparent chronic fracture involving the distal waist of the scaphoid is better characterized on concurrent wrist radiographs. 3.  Mild scattered vascular calcifications seen.   Original Report Authenticated By: Tonia Ghent, M.D.   ROS: As per HPI otherwise negative.  Physical Exam: Filed Vitals:   08/12/12 1055 08/12/12  1117 08/12/12 1200 08/12/12 1230  BP: 154/57 146/59 128/52 136/59  Pulse: 96 99  89  Temp: 98.1 F (36.7 C)     TempSrc: Oral     Resp:  16 11 15   Height:      Weight: 75.6 kg (166 lb 10.7 oz)     SpO2:    99%     General: Well developed, overweight, uncomfortable on HD, raised arms above her head so I wouldn't touch her hands during my exam due to extreme pain. Head: Normocephalic, abrasion on nose Neck:  cervical neck brace in place Lungs:  Diminished BS coarse BS on right with a few exp wheezes bilaterally Heart: RRR with S1 S2. No murmurs, rubs, or gallops appreciated. Abdomen: Soft, non-tender, non-distended w/ + bowel sounds. Extremities: tr LE  edema, no open wounds; upper - wrist splints on both hands - not examined - slight puffiness on observation. Neuro: Alert  Moves all extremities spontaneously. Psych:  Responds to questions appropriately with a normal affect. Dialysis Access: left upper AVGG Qb 400  Dialysis Orders:  [facility] TTS 3 hours 400/700 Polyflux 21 Gambro left upper loop graft EDW 158# = 71.8  kg 2K 2.5 Ca bath; Araensp 40 - due 5/29; just increased from 25 mcg - no vitamin D Recent labs:  Hgb 8.7 5/22, 8.9 5/08 Fe 88 and ferritin 1905 5/08 iPTH 144 4/03 -   Assessment/Plan: 1. Bilateral severe hand pain - MRI findings (T spine) as above - acute pain not fully explained; NS to see 2. Questionable - HCAP - per primary; has increased, no cough or fever; will repeat CXR Saturday after second HD tmt to decrease volume, 3. ESRD -  On TTS at [facility] Hyperkalemia and hyponatremia - due to missed dialysis; only dialyzes for 3 hours, though has adequate kinetics with this time; plan HD today and again Saturday to get back on schedule. 4. Hypertension/volume  - above EDW; lower volume - goal 3.5 on dialysis today extended length of treatment for ease of fluid removal; continue bid lasix for now; though don't know that she really needs it; may need EDW  lowered 5. Anemia  - stable consistant with outpatient Hgb, though it is low; prior ESA show Epo 6000 in April, converted to Aranesp 25 and was due to increase to 40 5/29 - Aranesp 40 today 6. Metabolic bone disease -  vitmain D not needed; P well controlled as outpt; on 1 phoslo ac tid 7. Nutrition - renal diet + vitamin  Sheffield Slider, PA-C [facility] Beeper [phone] 08/12/2012, 12:53 PM   Mrs. Fano is admitted following a fall.  She still has lots of pain in her hands.  She is wearing braces on hands and neck collar.  She usually gets HD at [facility].  Dr. Angelique Blonder Laurienti is her nephrologist  in [facility].  She usually dialyzes T-Th-Sat, but missed yesterday because of ER visit.  Findings on CXR could be "pneumonia," but she has no fever and no puruent sputum.  Will dialyze today and tomorrow and see what CXR looks like with fluid removal.  Agree with plans as outlined above.

## 2012-08-12 NOTE — Progress Notes (Signed)
PROGRESS NOTE  Nicole Cox ZOX:[id number] DOB: [birth year] DOA: 08/11/2012 PCP: Joana Reamer, MD  Brief narrative: 20 year African American female ESRD TTS [place], status post fall 08/11/2012 presented to [facility] and was discharged home-repeat a good secondary to severe bilateral hand pain but notable spinal cord abnormality at T4-T5. Neurology and neurosurgery were consulted and both did not think that this was anything acute and needed to be done although repeat MRI is warranted/EMG?carpal tunnel-she became hypoxic in the emergency room and was noted to have a pneumonia as well as dialysis.  Past medical history-As per Problem list Chart reviewed as below- None in   Consultants: Both spoke to the emergency room physician  Neurology   Neurosurgery  Procedures:  MRI spine  Antibiotics:  Cefepime 5/29  Vancomycin 5/30  Levaquin 5/29-5/30   Subjective  Still in pain in her hands. Tender to touch. On dialysis unit currently. No other real complaints. Sats are 100% with oxygen   Objective    Interim History: And 80  Telemetry: Normal sinus rhythm  Objective: Filed Vitals:   08/12/12 1430 08/12/12 1500 08/12/12 1510 08/12/12 1513  BP: 115/51 90/41 118/49 132/54  Pulse: 96 107 100 99  Temp:    97.4 F (36.3 C)  TempSrc:    Oral  Resp: 12 15 16 13   Height:      Weight:    71.8 kg (158 lb 4.6 oz)  SpO2:    96%    Intake/Output Summary (Last 24 hours) at 08/12/12 1633 Last data filed at 08/12/12 1513  Gross per 24 hour  Intake      0 ml  Output   3315 ml  Net  -3315 ml    Exam:  General: Alert pleasant oriented-abrasion to nose Cardiovascular: S1-S2 no murmur rub or gallop Respiratory:  Abdomen: Clinically clear Skin no lower extremity Neuro grossly intact however has sensory-C6 to the hands which are tender to touch.  Data Reviewed: Basic Metabolic Panel:  Recent Labs Lab 08/11/12 1508 08/12/12 0400  NA 135 131*  K 4.3  5.7*  CL 94* 91*  CO2 26 25  GLUCOSE 111* 247*  BUN 42* 53*  CREATININE 8.61* 9.16*  CALCIUM 9.6 9.8   Liver Function Tests:  Recent Labs Lab 08/11/12 1508 08/12/12 0400  AST 29 28  ALT 28 28  ALKPHOS 87 88  BILITOT 0.3 0.3  PROT 7.2 7.4  ALBUMIN 3.1* 2.9*   No results found for this basename: LIPASE, AMYLASE,  in the last 168 hours No results found for this basename: AMMONIA,  in the last 168 hours CBC:  Recent Labs Lab 08/11/12 1836  WBC 13.4*  NEUTROABS 10.8*  HGB 8.7*  HCT 26.9*  MCV 104.3*  PLT 280   Cardiac Enzymes: No results found for this basename: CKTOTAL, CKMB, CKMBINDEX, TROPONINI,  in the last 168 hours BNP: No components found with this basename: POCBNP,  CBG:  Recent Labs Lab 08/10/12 2140 08/11/12 1046 08/11/12 2156 08/12/12 0741  GLUCAP 206* 141* 178* 190*    No results found for this or any previous visit (from the past 240 hour(s)).   Studies:              All Imaging reviewed and is as per above notation   Scheduled Meds: . amLODipine  5 mg Oral Daily  . aspirin EC  81 mg Oral Daily  . calcium acetate  1,334 mg Oral Q breakfast  . [START ON 08/13/2012] ceFEPime (MAXIPIME) IV  2 g Intravenous Q T,Th,Sa-HD  . cloNIDine  0.2 mg Oral BID  . darbepoetin (ARANESP) injection - DIALYSIS  40 mcg Intravenous Q Fri-HD  . furosemide  80 mg Oral BID  . guaiFENesin  600 mg Oral BID  . insulin aspart  0-9 Units Subcutaneous Q4H  . insulin glargine  16 Units Subcutaneous QHS  . multivitamin  1 tablet Oral Daily  . pantoprazole  40 mg Oral Q1200  . simvastatin  20 mg Oral q1800  . sodium chloride  3 mL Intravenous Q12H  . [START ON 08/13/2012] vancomycin  750 mg Intravenous Q T,Th,Sa-HD   Continuous Infusions:    Assessment/Plan:  1. Mechanical fall with C1 nondisplaced anterior to the dens fracture indeterminate age + no mild ENT 4/5 level-patient does not require neurology input and I proceed discussed with Dr. Luisa Hart who states she may  need an MRI in the future one to 2 months as well as potential EMG studies for what appeared to be cervical radicular pains but does not have any need for any further interventions at present time 2. End-stage renal disease-Tuesday Thursday Saturday candidate [place]-appreciate nephrology input-would continue dialysis as per Dr. Scherrie Gerlach plan 5/29 and 5/30-will discontinue Lasix 80 twice a day 3. Hypertension continue amlodipine 5 mg daily, clonidine 0.2 twice a day 4. Diabetes mellitus A1c 6.4 continue Lantus 16 units each bedtime sliding scale coverage moderate 5. ? Pneumonia-continue cefepime/Levaquin for now-repeat chest x-ray two-view 07/3112 to rule out pneumonia. 6. Acute respiratory failure-probably related to noncompliance and missing dialysis-monitor.  Would discontinue opiates IV.  Her Percocet 10/325 every 6 when necessar 7. Hyperlipidemia continue Zocor 20 mg daily  Code Status: Full Family Communication: spoke c Dyke Maes Other [phone] Disposition Plan: Get PT/OT to see and review in am   Pleas Koch, MD  [facility] Pager [phone] 08/12/2012, 4:33 PM    LOS: 1 day

## 2012-08-12 NOTE — Procedures (Signed)
[facility]  On HD via L UA AVG BP--146/59 Goal--3.5 L Hgb--8.7   K+--5.7 She usually has T-Th-Sat treatments, but missed HD yesterday in [facility] since she was in ER

## 2012-08-12 NOTE — Progress Notes (Signed)
After talking to pt, pt states that she prefers inhalers. She takes combivent once a day most days. She just wanted PRN meds and she would call if needed. Pt did not want to be bothered with breathing treatments

## 2012-08-12 NOTE — Progress Notes (Signed)
ANTIBIOTIC CONSULT NOTE - INITIAL  Pharmacy Consult for vancomycin  Indication: rule out pneumonia  No Known Allergies  Patient Measurements: Height: 5\' 3"  (160 cm) Weight: 163 lb 3.2 oz (74.027 kg) IBW/kg (Calculated) : 52.4  Vital Signs: Temp: 98.2 F (36.8 C) (05/29 2127) Temp src: Oral (05/29 2127) BP: 176/68 mmHg (05/29 2127) Pulse Rate: 106 (05/29 2127) Intake/Output from previous day:   Intake/Output from this shift:    Labs:  Recent Labs  08/11/12 1508 08/11/12 1836  WBC  --  13.4*  HGB  --  8.7*  PLT  --  280  CREATININE 8.61*  --    Estimated Creatinine Clearance: 5.2 ml/min (by C-G formula based on Cr of 8.61). No results found for this basename: VANCOTROUGH, VANCOPEAK, VANCORANDOM, GENTTROUGH, GENTPEAK, GENTRANDOM, TOBRATROUGH, TOBRAPEAK, TOBRARND, AMIKACINPEAK, AMIKACINTROU, AMIKACIN,  in the last 72 hours   Microbiology: No results found for this or any previous visit (from the past 720 hour(s)).  Medical History: Past Medical History  Diagnosis Date  . Renal disorder   . Diabetes mellitus without complication   . Hypertension   . Heart disease   . COPD (chronic obstructive pulmonary disease)   . Stroke   . CHF (congestive heart failure)   . Coronary artery disease    Medications:  Scheduled:  . amLODipine  5 mg Oral Daily  . aspirin EC  81 mg Oral Daily  . calcium acetate  1,334 mg Oral Q breakfast  . ceFEPime (MAXIPIME) IV  1 g Intravenous Q8H  . cloNIDine  0.2 mg Oral BID  . furosemide  80 mg Oral BID  . guaiFENesin  600 mg Oral BID  . insulin aspart  0-9 Units Subcutaneous Q4H  . insulin glargine  16 Units Subcutaneous QHS  . ipratropium  0.5 mg Nebulization Q6H  . pantoprazole  40 mg Oral Q1200  . simvastatin  20 mg Oral q1800  . sodium chloride  3 mL Intravenous Q12H   Assessment: 79 yo female with ESRD-HD TTS admitted with possible pneumonia. Pharmacy to manage vancomycin. Patient is also to receive cefepime.   Goal of  Therapy:  Pre-HD vancomycin level 15 - 25 mcg/mL  Plan:  1. Vancomycin 1.5gm IV x 1, then 750mg  IV Q-HD 2. Change cefepime to 2gm IV x 1, then 2gm IV Q-HD  Emeline Gins 08/12/2012,12:01 AM

## 2012-08-13 LAB — CBC
Hemoglobin: 8.1 g/dL — ABNORMAL LOW (ref 12.0–15.0)
MCV: 107.1 fL — ABNORMAL HIGH (ref 78.0–100.0)
Platelets: 224 10*3/uL (ref 150–400)
RBC: 2.38 MIL/uL — ABNORMAL LOW (ref 3.87–5.11)
WBC: 14.4 10*3/uL — ABNORMAL HIGH (ref 4.0–10.5)

## 2012-08-13 LAB — RENAL FUNCTION PANEL
Albumin: 2.9 g/dL — ABNORMAL LOW (ref 3.5–5.2)
BUN: 40 mg/dL — ABNORMAL HIGH (ref 6–23)
CO2: 26 mEq/L (ref 19–32)
Calcium: 9.1 mg/dL (ref 8.4–10.5)
Chloride: 96 mEq/L (ref 96–112)
Creatinine, Ser: 6.43 mg/dL — ABNORMAL HIGH (ref 0.50–1.10)
GFR calc Af Amer: 6 mL/min — ABNORMAL LOW (ref >90)
GFR calc non Af Amer: 6 mL/min — ABNORMAL LOW (ref >90)
Glucose, Bld: 164 mg/dL — ABNORMAL HIGH (ref 70–99)
Phosphorus: 3.8 mg/dL (ref 2.3–4.6)
Potassium: 4.5 mEq/L (ref 3.5–5.1)
Sodium: 134 mEq/L — ABNORMAL LOW (ref 135–145)

## 2012-08-13 LAB — GLUCOSE, CAPILLARY
Glucose-Capillary: 127 mg/dL — ABNORMAL HIGH (ref 70–99)
Glucose-Capillary: 124 mg/dL — ABNORMAL HIGH (ref 70–99)

## 2012-08-13 LAB — LEGIONELLA ANTIGEN, URINE

## 2012-08-13 MED ORDER — LIDOCAINE HCL (PF) 1 % IJ SOLN: 5.0000 mL | INTRAMUSCULAR | Status: DC | PRN

## 2012-08-13 MED ORDER — HEPARIN SODIUM (PORCINE) 1000 UNIT/ML DIALYSIS: 20.0000 [IU]/kg | INTRAMUSCULAR | Status: DC | PRN

## 2012-08-13 MED ORDER — ALTEPLASE 2 MG IJ SOLR: 2.0000 mg | Freq: Once | INTRAMUSCULAR | Status: DC | PRN

## 2012-08-13 MED ORDER — PENTAFLUOROPROP-TETRAFLUOROETH EX AERO: 1.0000 "application " | INHALATION_SPRAY | CUTANEOUS | Status: DC | PRN

## 2012-08-13 MED ORDER — SODIUM CHLORIDE 0.9 % IV SOLN: 100.0000 mL | INTRAVENOUS | Status: DC | PRN

## 2012-08-13 MED ORDER — LIDOCAINE-PRILOCAINE 2.5-2.5 % EX CREA: 1.0000 "application " | TOPICAL_CREAM | CUTANEOUS | Status: DC | PRN

## 2012-08-13 MED ORDER — NEPRO/CARBSTEADY PO LIQD: 237.0000 mL | ORAL | Status: DC | PRN

## 2012-08-13 MED ORDER — HEPARIN SODIUM (PORCINE) 1000 UNIT/ML DIALYSIS: 1000.0000 [IU] | INTRAMUSCULAR | Status: DC | PRN

## 2012-08-13 NOTE — Progress Notes (Signed)
Hemodialysis- See flowsheet. BP drop during first 20 minutes of HD to 90s/40s. Dr. Caryn Section notified. Order to keep sbp>90 and alternate UF/HD x53min. Continue to monitor pt.

## 2012-08-13 NOTE — Progress Notes (Signed)
[facility]  Subjective:  Feeding herself lunch, wearing 02 nasal prongs, says L hand still hurts a lot.  Says she'll go to live with her daughter when d/c'd   Objective: Vital signs in last 24 hours: Blood pressure 124/68, pulse 92, temperature 98.4 F (36.9 C), temperature source Oral, resp. rate 16, height 5\' 3"  (1.6 m), weight 71.8 kg (158 lb 4.6 oz), SpO2 99.00%.    PHYSICAL EXAM General--as above, wearing  Aspen collar  Chest--clear Heart--no rub Abd--nontender Extr--wrists in splints, L wrist more tender than R; AVG patent L upper arm  Lab Results:   Recent Labs Lab 08/11/12 1508 08/12/12 0400 08/12/12 1827  NA 135 131* 135  K 4.3 5.7* 4.4  CL 94* 91* 96  CO2 26 25 28   BUN 42* 53* 20  CREATININE 8.61* 9.16* 4.23*  GLUCOSE 111* 247* 194*  CALCIUM 9.6 9.8 9.2  PHOS  --   --  3.5     Recent Labs  08/11/12 1836 08/13/12 0435  WBC 13.4* 14.4*  HGB 8.7* 8.1*  HCT 26.9* 25.5*  PLT 280 224     I have reviewed the patient's current medications. Scheduled: . amLODipine  5 mg Oral Daily  . aspirin EC  81 mg Oral Daily  . calcium acetate  1,334 mg Oral Q breakfast  . ceFEPime (MAXIPIME) IV  2 g Intravenous Q T,Th,Sa-HD  . cloNIDine  0.2 mg Oral BID  . darbepoetin (ARANESP) injection - DIALYSIS  40 mcg Intravenous Q Fri-HD  . furosemide  80 mg Oral BID  . guaiFENesin  600 mg Oral BID  . insulin aspart  0-9 Units Subcutaneous TID WC  . insulin glargine  16 Units Subcutaneous QHS  . multivitamin  1 tablet Oral Daily  . pantoprazole  40 mg Oral Q1200  . simvastatin  20 mg Oral q1800  . sodium chloride  3 mL Intravenous Q12H  . vancomycin  750 mg Intravenous Q T,Th,Sa-HD    Assessment/Plan: 1. Bilateral severe hand pain - MRI findings (T spine) as above - acute pain not fully explained; May  Need ortho/hand to see 2. Questionable - HCAP - per primary; has increased, no cough or fever; will repeat CXR Saturday after second HD.  If CXR  clears post dialysis, the OK to d/c antibiotics 3. ESRD - On TTS at [facility] Hyperkalemia and hyponatremia - due to missed dialysis; only dialyzes for 3 hours, though has adequate kinetics with this time; plan HD today and again Saturday to get back on schedule. 4. Hypertension/volume - BP 124/68.  Plan to dialyze and get below EDW (72 kg as outpatient--weight this AM is 71.8 kg) 5. Anemia - stable consistant with outpatient Hgb, though it is low; prior ESA show Epo 6000 in April, converted to Aranesp 25 and was due to increase to 40 5/29 - Aranesp 40 today 6. Metabolic bone disease - vitmain D not needed; P well controlled as outpt; on 1 phoslo ac tid 7. Nutrition - renal diet + vitamin      LOS: 2 days   Doyl Bitting F 08/13/2012,12:31 PM   .labalb

## 2012-08-13 NOTE — Evaluation (Signed)
Physical Therapy Evaluation Patient Details Name: Nicole Cox MRN: [id number] DOB: 28-Jan-1932 Today's Date: 08/13/2012 Time: 6295-2841 PT Time Calculation (min): 30 min  PT Assessment / Plan / Recommendation Clinical Impression  Pt is 79 y/o female admitted s/p fall resulting in nondisplaced C1 fx and CT showed T4-5 stensois due to ischemic in nature.  Pt limited due to hypersensitive in bilateral UEs limiting overall mobility.  Pt will benefit from acute PT services to improve overall mobilty and prepare for safe d/c to next venue.  Highly recommend inpatient rehab at this time.    PT Assessment  Patient needs continued PT services    Follow Up Recommendations  CIR    Barriers to Discharge None      Equipment Recommendations   (will continue to assess)    Recommendations for Other Services Rehab consult   Frequency Min 4X/week    Precautions / Restrictions Precautions Precautions: Fall;Cervical Required Braces or Orthoses: Cervical Brace Cervical Brace: Hard collar Restrictions Weight Bearing Restrictions: No   Pertinent Vitals/Pain 10/10 when bilateral UE touched left > right      Mobility  Bed Mobility Bed Mobility: Supine to Sit;Sitting - Scoot to Edge of Bed Supine to Sit: 3: Mod assist Sitting - Scoot to Edge of Bed: 3: Mod assist Details for Bed Mobility Assistance: (A) to elevate trunk OOB with cues for technique.  Pt unable to use UE to assist due to pain.  Transfers Transfers: Sit to Stand;Stand to Sit;Stand Pivot Transfers Sit to Stand: 3: Mod assist;From bed Stand to Sit: 3: Mod assist;To chair/3-in-1 Stand Pivot Transfers: 4: Min assist Details for Transfer Assistance: (A) to initiate transfer and slowly descend to recliner with cues for technique without use of UE to assist Ambulation/Gait Ambulation/Gait Assistance: Not tested (comment)    Exercises     PT Diagnosis: Difficulty walking;Generalized weakness;Acute pain;Abnormality of gait  PT  Problem List: Decreased strength;Decreased activity tolerance;Decreased balance;Decreased mobility;Decreased coordination;Decreased knowledge of use of DME;Pain;Impaired sensation PT Treatment Interventions: DME instruction;Gait training;Functional mobility training;Therapeutic activities;Therapeutic exercise;Balance training;Neuromuscular re-education;Patient/family education   PT Goals Acute Rehab PT Goals PT Goal Formulation: With patient Time For Goal Achievement: 08/27/12 Potential to Achieve Goals: Good Pt will go Supine/Side to Sit: with supervision PT Goal: Supine/Side to Sit - Progress: Goal set today Pt will go Sit to Supine/Side: with supervision PT Goal: Sit to Supine/Side - Progress: Goal set today Pt will go Sit to Stand: with supervision PT Goal: Sit to Stand - Progress: Goal set today Pt will go Stand to Sit: with supervision PT Goal: Stand to Sit - Progress: Goal set today Pt will Transfer Bed to Chair/Chair to Bed: with supervision PT Transfer Goal: Bed to Chair/Chair to Bed - Progress: Goal set today Pt will Ambulate: >150 feet;with least restrictive assistive device;with supervision PT Goal: Ambulate - Progress: Goal set today  Visit Information  Last PT Received On: 08/13/12 Assistance Needed: +2 (ambulation)    Subjective Data  Subjective: It hurts to touch anything with my hands especially my left hand. Patient Stated Goal: To go home   Prior Functioning  Home Living Lives With: Spouse;Son (adopted son which is 4 y/o) Available Help at Discharge: Available 24 hours/day (plans to d/c to dtrs home) Type of Home: House Home Access: Stairs to enter [employer] of Steps: 5 Entrance Stairs-Rails: Left Home Layout: Two level;Able to live on main level with bedroom/bathroom Bathroom Shower/Tub: Engineer, manufacturing systems: Standard Bathroom Accessibility: Yes How Accessible: Accessible via walker Home Adaptive  Equipment: Dan Humphreys - four  wheeled;Straight cane Prior Function Level of Independence: Independent with assistive device(s) (uses RW on HD days and SPC other days) Able to Take Stairs?: Yes Driving: No Vocation: Retired Musician: No difficulties Dominant Hand: Right    Cognition  Cognition Arousal/Alertness: Awake/alert Behavior During Therapy: WFL for tasks assessed/performed Overall Cognitive Status: Within Functional Limits for tasks assessed    Extremity/Trunk Assessment Right Upper Extremity Assessment RUE ROM/Strength/Tone: Unable to fully assess;Due to pain RUE Sensation: Deficits RUE Sensation Deficits: hypersensitive Left Upper Extremity Assessment LUE ROM/Strength/Tone: Deficits;Unable to fully assess;Due to pain LUE Sensation: Deficits LUE Sensation Deficits: hypersensitive Right Lower Extremity Assessment RLE ROM/Strength/Tone: Within functional levels RLE Sensation: WFL - Light Touch Left Lower Extremity Assessment LLE ROM/Strength/Tone: Within functional levels LLE Sensation: WFL - Light Touch   Balance Balance Balance Assessed: Yes Static Sitting Balance Static Sitting - Balance Support: No upper extremity supported;Feet supported Static Sitting - Level of Assistance: 5: Stand by assistance Static Standing Balance Static Standing - Balance Support: No upper extremity supported Static Standing - Level of Assistance: 4: Min assist Static Standing - Comment/# of Minutes: (A) to maintain balance with cues for upright posture  End of Session PT - End of Session Equipment Utilized During Treatment: Cervical collar;Gait belt;Other (comment) (bilateral hand brace) Activity Tolerance: Patient limited by pain Patient left: in chair;with call bell/phone within reach Nurse Communication: Mobility status  GP     Lorna Strother 08/13/2012, 9:57 AM Jake Shark, PT DPT [phone]

## 2012-08-13 NOTE — Consult Note (Signed)
Nicole Cox is an 79 y.o. female.   Chief Complaint: bilateral hand pain HPI: 79 yo female complains of left greater than right bilateral hand pain.  Left thumb is greatest pain.  States she has had pain from arthritis in the past that was responsive to tylenol.  Larey Seat three days ago and pain has been worse in hands since.  Complaint is pain more than numbness or tingling.  No fevers or chills.  No wounds to hands.   History of diabetes and dialysis for past seven months.  Past Medical History  Diagnosis Date  . Renal disorder   . Diabetes mellitus without complication   . Hypertension   . Heart disease   . COPD (chronic obstructive pulmonary disease)   . Stroke   . CHF (congestive heart failure)   . Coronary artery disease     Past Surgical History  Procedure Laterality Date  . Abdominal hysterectomy    . Cholecystectomy    . Tonsillectomy      Family History  Problem Relation Age of Onset  . Heart disease Mother   . Hypertension Mother   . Heart disease Father   . Hypertension Father   . Heart disease Sister   . Diabetes Sister   . Stroke Sister   . Kidney disease Sister   . Hypertension Sister   . Heart disease Sister   . Kidney disease Sister   . Hypertension Sister   . Kidney disease Daughter   . Hypertension Daughter    Social History:  reports that she has quit smoking. She does not have any smokeless tobacco history on file. She reports that she does not drink alcohol or use illicit drugs.  Allergies: No Known Allergies  Medications Prior to Admission  Medication Sig Dispense Refill  . acetaminophen (TYLENOL) 500 MG tablet Take 500 mg by mouth every 6 (six) hours as needed for pain.      Marland Kitchen amLODipine (NORVASC) 5 MG tablet Take 5 mg by mouth daily.      Marland Kitchen aspirin EC 81 MG tablet Take 81 mg by mouth daily.      . calcium acetate (PHOSLO) 667 MG capsule Take 1,334 mg by mouth daily.      . cloNIDine (CATAPRES) 0.2 MG tablet Take 0.2 mg by mouth 2 (two) times  daily.      . furosemide (LASIX) 80 MG tablet Take 80 mg by mouth 2 (two) times daily. One every other day      . insulin aspart (NOVOLOG) 100 UNIT/ML injection Inject 6 Units into the skin 3 (three) times daily with meals.      . insulin glargine (LANTUS) 100 UNIT/ML injection Inject 16 Units into the skin at bedtime.      . pravastatin (PRAVACHOL) 40 MG tablet Take 40 mg by mouth daily.      Marland Kitchen PRESCRIPTION MEDICATION Inhale 2 puffs into the lungs daily as needed (inhailer).      . traMADol (ULTRAM) 50 MG tablet Take 50 mg by mouth every 8 (eight) hours as needed for pain.       . Vitamin D, Ergocalciferol, (DRISDOL) 50000 UNITS CAPS Take 50,000 Units by mouth daily.        Results for orders placed during the hospital encounter of 08/11/12 (from the past 48 hour(s))  GLUCOSE, CAPILLARY     Status: Abnormal   Collection Time    08/11/12  9:56 PM      Result Value Range   Glucose-Capillary  178 (*) 70 - 99 mg/dL  HEMOGLOBIN Z6X     Status: Abnormal   Collection Time    08/11/12 11:59 PM      Result Value Range   Hemoglobin A1C 6.4 (*) <5.7 %   Comment: (NOTE)                                                                               According to the ADA Clinical Practice Recommendations for 2011, when     HbA1c is used as a screening test:      >=6.5%   Diagnostic of Diabetes Mellitus               (if abnormal result is confirmed)     5.7-6.4%   Increased risk of developing Diabetes Mellitus     References:Diagnosis and Classification of Diabetes Mellitus,Diabetes     Care,2011,34(Suppl 1):S62-S69 and Standards of Medical Care in             Diabetes - 2011,Diabetes Care,2011,34 (Suppl 1):S11-S61.   Mean Plasma Glucose 137 (*) <117 mg/dL  CULTURE, BLOOD (ROUTINE X 2)     Status: None   Collection Time    08/12/12 12:10 AM      Result Value Range   Specimen Description BLOOD LEFT ARM     Special Requests       Value: BOTTLES DRAWN AEROBIC AND ANAEROBIC 10CC,PT ON LEVAQUIN    Culture  Setup Time 08/12/2012 10:31     Culture       Value:        BLOOD CULTURE RECEIVED NO GROWTH TO DATE CULTURE WILL BE HELD FOR 5 DAYS BEFORE ISSUING A FINAL NEGATIVE REPORT   Report Status PENDING    CULTURE, BLOOD (ROUTINE X 2)     Status: None   Collection Time    08/12/12 12:15 AM      Result Value Range   Specimen Description BLOOD LEFT HAND     Special Requests BOTTLES DRAWN AEROBIC ONLY 10CC,PT ON LEVAQUIN     Culture  Setup Time 08/12/2012 10:30     Culture       Value:        BLOOD CULTURE RECEIVED NO GROWTH TO DATE CULTURE WILL BE HELD FOR 5 DAYS BEFORE ISSUING A FINAL NEGATIVE REPORT   Report Status PENDING    LEGIONELLA ANTIGEN, URINE     Status: None   Collection Time    08/12/12  1:13 AM      Result Value Range   Specimen Description URINE, RANDOM     Special Requests NONE     Legionella Antigen, Urine Negative for Legionella pneumophilia serogroup 1     Report Status 08/13/2012 FINAL    STREP PNEUMONIAE URINARY ANTIGEN     Status: None   Collection Time    08/12/12  1:13 AM      Result Value Range   Strep Pneumo Urinary Antigen NEGATIVE  NEGATIVE   Comment:            Infection due to S. pneumoniae     cannot be absolutely ruled out     since the antigen present  may be below the detection limit     of the test.  COMPREHENSIVE METABOLIC PANEL     Status: Abnormal   Collection Time    08/12/12  4:00 AM      Result Value Range   Sodium 131 (*) 135 - 145 mEq/L   Potassium 5.7 (*) 3.5 - 5.1 mEq/L   Comment: DELTA CHECK NOTED     NO VISIBLE HEMOLYSIS   Chloride 91 (*) 96 - 112 mEq/L   CO2 25  19 - 32 mEq/L   Glucose, Bld 247 (*) 70 - 99 mg/dL   BUN 53 (*) 6 - 23 mg/dL   Creatinine, Ser 1.61 (*) 0.50 - 1.10 mg/dL   Calcium 9.8  8.4 - 09.6 mg/dL   Total Protein 7.4  6.0 - 8.3 g/dL   Albumin 2.9 (*) 3.5 - 5.2 g/dL   AST 28  0 - 37 U/L   ALT 28  0 - 35 U/L   Alkaline Phosphatase 88  39 - 117 U/L   Total Bilirubin 0.3  0.3 - 1.2 mg/dL   GFR calc non  Af Amer 4 (*) >90 mL/min   GFR calc Af Amer 4 (*) >90 mL/min   Comment:            The eGFR has been calculated     using the CKD EPI equation.     This calculation has not been     validated in all clinical     situations.     eGFR's persistently     <90 mL/min signify     possible Chronic Kidney Disease.  GLUCOSE, CAPILLARY     Status: Abnormal   Collection Time    08/12/12  7:41 AM      Result Value Range   Glucose-Capillary 190 (*) 70 - 99 mg/dL   Comment 1 Notify RN    GLUCOSE, CAPILLARY     Status: Abnormal   Collection Time    08/12/12  4:46 PM      Result Value Range   Glucose-Capillary 115 (*) 70 - 99 mg/dL   Comment 1 Notify RN    RENAL FUNCTION PANEL     Status: Abnormal   Collection Time    08/12/12  6:27 PM      Result Value Range   Sodium 135  135 - 145 mEq/L   Potassium 4.4  3.5 - 5.1 mEq/L   Chloride 96  96 - 112 mEq/L   CO2 28  19 - 32 mEq/L   Glucose, Bld 194 (*) 70 - 99 mg/dL   BUN 20  6 - 23 mg/dL   Comment: DELTA CHECK NOTED   Creatinine, Ser 4.23 (*) 0.50 - 1.10 mg/dL   Comment: DELTA CHECK NOTED   Calcium 9.2  8.4 - 10.5 mg/dL   Phosphorus 3.5  2.3 - 4.6 mg/dL   Albumin 2.9 (*) 3.5 - 5.2 g/dL   GFR calc non Af Amer 9 (*) >90 mL/min   GFR calc Af Amer 11 (*) >90 mL/min   Comment:            The eGFR has been calculated     using the CKD EPI equation.     This calculation has not been     validated in all clinical     situations.     eGFR's persistently     <90 mL/min signify     possible Chronic Kidney Disease.  GLUCOSE, CAPILLARY  Status: Abnormal   Collection Time    08/12/12  7:57 PM      Result Value Range   Glucose-Capillary 200 (*) 70 - 99 mg/dL  CBC     Status: Abnormal   Collection Time    08/13/12  4:35 AM      Result Value Range   WBC 14.4 (*) 4.0 - 10.5 K/uL   RBC 2.38 (*) 3.87 - 5.11 MIL/uL   Hemoglobin 8.1 (*) 12.0 - 15.0 g/dL   HCT 84.6 (*) 96.2 - 95.2 %   MCV 107.1 (*) 78.0 - 100.0 fL   MCH 34.0  26.0 - 34.0 pg    MCHC 31.8  30.0 - 36.0 g/dL   RDW 84.1 (*) 32.4 - 40.1 %   Platelets 224  150 - 400 K/uL  GLUCOSE, CAPILLARY     Status: Abnormal   Collection Time    08/13/12  7:36 AM      Result Value Range   Glucose-Capillary 127 (*) 70 - 99 mg/dL   Comment 1 Notify RN    GLUCOSE, CAPILLARY     Status: Abnormal   Collection Time    08/13/12 11:42 AM      Result Value Range   Glucose-Capillary 132 (*) 70 - 99 mg/dL   Comment 1 Notify RN    RENAL FUNCTION PANEL     Status: Abnormal   Collection Time    08/13/12  1:06 PM      Result Value Range   Sodium 134 (*) 135 - 145 mEq/L   Potassium 4.5  3.5 - 5.1 mEq/L   Chloride 96  96 - 112 mEq/L   CO2 26  19 - 32 mEq/L   Glucose, Bld 164 (*) 70 - 99 mg/dL   BUN 40 (*) 6 - 23 mg/dL   Creatinine, Ser 0.27 (*) 0.50 - 1.10 mg/dL   Comment: DELTA CHECK NOTED   Calcium 9.1  8.4 - 10.5 mg/dL   Phosphorus 3.8  2.3 - 4.6 mg/dL   Albumin 2.9 (*) 3.5 - 5.2 g/dL   GFR calc non Af Amer 6 (*) >90 mL/min   GFR calc Af Amer 6 (*) >90 mL/min   Comment:            The eGFR has been calculated     using the CKD EPI equation.     This calculation has not been     validated in all clinical     situations.     eGFR's persistently     <90 mL/min signify     possible Chronic Kidney Disease.  GLUCOSE, CAPILLARY     Status: Abnormal   Collection Time    08/13/12  5:12 PM      Result Value Range   Glucose-Capillary 166 (*) 70 - 99 mg/dL   Comment 1 Notify RN      Dg Chest Port 1 View  08/12/2012   *RADIOLOGY REPORT*  Clinical Data: Follow up chest radiograph after dialysis, shortness of breath, chest pain.  PORTABLE CHEST - 1 VIEW  Comparison: 08/11/2012  Findings: Stable patchy right lower lobe opacity.  While the appearance could reflect pneumonia, the apparent high density also raises the possibility of prior aspiration of barium contrast.  Pulmonary vascular congestion without frank interstitial edema.  No pleural effusion or pneumothorax.  Mild cardiomegaly.   IMPRESSION: Stable patchy right lower lobe opacity, possibly reflecting pneumonia, although the appearance could also reflect prior aspiration of barium.  Correlate with  prior outside hospital imaging, if available.  Pulmonary vascular congestion without frank interstitial edema.   Original Report Authenticated By: Charline Bills, M.D.     A comprehensive review of systems was negative.  Blood pressure 130/55, pulse 82, temperature 99.1 F (37.3 C), temperature source Oral, resp. rate 15, height 5\' 3"  (1.6 m), weight 70.1 kg (154 lb 8.7 oz), SpO2 95.00%.  General appearance: alert, cooperative and appears stated age Head: Normocephalic, without obvious abnormality, abrasions on nose and eccymosis under left eye Neck: wearing c collar Extremities: intact sensation and capillary refill all digits.  +epl/fpl/io.  no wounds except small abrasion left thumb dorsum.  no swelling or erythema around wound.  minimal swelling at left index mp joint without erythema or streaking.  ttp cmc joint of bilateral thumbs with positive grind test.  negative tinels, phalens, and durkins test bilaterally.  no necrosis in fingertips or splinter hemorrhages. Pulses: 2+ and symmetric Skin: Skin color, texture, turgor normal. No rashes or lesions Neurologic: Grossly normal Incision/Wound: na  Assessment/Plan Bilateral hand pain possibly due to arthritis exacerbations including cmc arthritis and possibly ischemic in nature.  No signs of infection on exam or XR.  Radiographs show degenerative changes in bilateral hands, worse at cmc joint on left than on right.  Old scaphoid fracture on right.  Calcification of radial and ulnar arteries and some vessels in hand noted.  This can be seen in esrd and these patients can have ischemia and pain of the digits.  Patient already on aspirin and calcium channel blocker.  Thumb spica splints could be tried for pain control, though she may not tolerate them as she is very tender to  palpation.  Antiinflammatories could be beneficial, though she may not be able to take them due to esrd.  No surgical indication at this time.  Kaci Dillie R 08/13/2012, 8:34 PM

## 2012-08-13 NOTE — Progress Notes (Signed)
PROGRESS NOTE  Nicole Cox ZOX:[id number] DOB: 11/18/1931 DOA: 08/11/2012 PCP: Joana Reamer, MD  Brief narrative: 56 year African American female ESRD TTS [place], status post fall 08/11/2012 presented to [facility] long hospital and was discharged home-repeat a good secondary to severe bilateral hand pain but notable spinal cord abnormality at T4-T5. Neurology and neurosurgery were consulted and both did not think that this was anything acute and needed to be done although repeat MRI is warranted/EMG?carpal tunnel-she became hypoxic in the emergency room and was noted to have a pneumonia as well as dialysis.  Past medical history-As per Problem list Chart reviewed as below- None in   Consultants: Both spoke to the emergency room physician  Neurology   Neurosurgery  Procedures:  MRI spine  Antibiotics:  Cefepime 5/29  Vancomycin 5/30  Levaquin 5/29-5/30   Subjective  Still in pain in her hands. Tender to touch.    Objective    Interim History: And 80  Telemetry: Normal sinus rhythm  Objective: Filed Vitals:   08/13/12 1530 08/13/12 1600 08/13/12 1630 08/13/12 1645  BP: 121/73 104/57 124/54 147/61  Pulse: 85 82 90 88  Temp:    98.1 F (36.7 C)  TempSrc:    Oral  Resp: 16   15  Height:      Weight:      SpO2:    100%    Intake/Output Summary (Last 24 hours) at 08/13/12 1653 Last data filed at 08/13/12 1645  Gross per 24 hour  Intake    843 ml  Output   2791 ml  Net  -1948 ml    Exam:  General: Alert pleasant oriented-abrasion to nose Cardiovascular: S1-S2 no murmur rub or gallop Respiratory:  Abdomen: Clinically clear Skin no lower extremity-significantly painful and tender left index finger swelling of the arm/wrist noted. Cannot do manual muscle testing of the fingers given patient's intractable pain Neuro grossly intact however has sensory-C6 to the hands which are tender to touch.  Data Reviewed: Basic Metabolic Panel:  Recent  Labs Lab 08/11/12 1508 08/12/12 0400 08/12/12 1827 08/13/12 1306  NA 135 131* 135 134*  K 4.3 5.7* 4.4 4.5  CL 94* 91* 96 96  CO2 26 25 28 26   GLUCOSE 111* 247* 194* 164*  BUN 42* 53* 20 40*  CREATININE 8.61* 9.16* 4.23* 6.43*  CALCIUM 9.6 9.8 9.2 9.1  PHOS  --   --  3.5 3.8   Liver Function Tests:  Recent Labs Lab 08/11/12 1508 08/12/12 0400 08/12/12 1827 08/13/12 1306  AST 29 28  --   --   ALT 28 28  --   --   ALKPHOS 87 88  --   --   BILITOT 0.3 0.3  --   --   PROT 7.2 7.4  --   --   ALBUMIN 3.1* 2.9* 2.9* 2.9*   No results found for this basename: LIPASE, AMYLASE,  in the last 168 hours No results found for this basename: AMMONIA,  in the last 168 hours CBC:  Recent Labs Lab 08/11/12 1836 08/13/12 0435  WBC 13.4* 14.4*  NEUTROABS 10.8*  --   HGB 8.7* 8.1*  HCT 26.9* 25.5*  MCV 104.3* 107.1*  PLT 280 224   Cardiac Enzymes: No results found for this basename: CKTOTAL, CKMB, CKMBINDEX, TROPONINI,  in the last 168 hours BNP: No components found with this basename: POCBNP,  CBG:  Recent Labs Lab 08/12/12 0741 08/12/12 1646 08/12/12 1957 08/13/12 0736 08/13/12 1142  GLUCAP 190* 115*  200* 127* 132*    Recent Results (from the past 240 hour(s))  CULTURE, BLOOD (ROUTINE X 2)     Status: None   Collection Time    08/12/12 12:10 AM      Result Value Range Status   Specimen Description BLOOD LEFT ARM   Final   Special Requests     Final   Value: BOTTLES DRAWN AEROBIC AND ANAEROBIC 10CC,PT ON LEVAQUIN   Culture  Setup Time 08/12/2012 10:31   Final   Culture     Final   Value:        BLOOD CULTURE RECEIVED NO GROWTH TO DATE CULTURE WILL BE HELD FOR 5 DAYS BEFORE ISSUING A FINAL NEGATIVE REPORT   Report Status PENDING   Incomplete  CULTURE, BLOOD (ROUTINE X 2)     Status: None   Collection Time    08/12/12 12:15 AM      Result Value Range Status   Specimen Description BLOOD LEFT HAND   Final   Special Requests BOTTLES DRAWN AEROBIC ONLY 10CC,PT ON  LEVAQUIN   Final   Culture  Setup Time 08/12/2012 10:30   Final   Culture     Final   Value:        BLOOD CULTURE RECEIVED NO GROWTH TO DATE CULTURE WILL BE HELD FOR 5 DAYS BEFORE ISSUING A FINAL NEGATIVE REPORT   Report Status PENDING   Incomplete     Studies:              All Imaging reviewed and is as per above notation   Scheduled Meds: . amLODipine  5 mg Oral Daily  . aspirin EC  81 mg Oral Daily  . calcium acetate  1,334 mg Oral Q breakfast  . ceFEPime (MAXIPIME) IV  2 g Intravenous Q T,Th,Sa-HD  . cloNIDine  0.2 mg Oral BID  . darbepoetin (ARANESP) injection - DIALYSIS  40 mcg Intravenous Q Fri-HD  . furosemide  80 mg Oral BID  . guaiFENesin  600 mg Oral BID  . insulin aspart  0-9 Units Subcutaneous TID WC  . insulin glargine  16 Units Subcutaneous QHS  . multivitamin  1 tablet Oral Daily  . pantoprazole  40 mg Oral Q1200  . simvastatin  20 mg Oral q1800  . sodium chloride  3 mL Intravenous Q12H  . vancomycin  750 mg Intravenous Q T,Th,Sa-HD   Continuous Infusions:    Assessment/Plan:  1. Mechanical fall with C1 nondisplaced anterior to the dens fracture indeterminate age + no mild ENT 4/5 level-patient does not require neurology input and I proceed discussed with Dr. Amada Jupiter who states she may need an MRI in the future one to 2 months as well as potential EMG studies for what appeared to be cervical radicular pains but does not have any need for any further interventions at present time 2. Intractable left index finger pain. Patient is no longer to examine fully and she states that even the sheets being on her hand causes significant pain-I've contacted hand surgeon Dr. Merlyn Lot who has graciously agreed to see the patient in consult 5/31 3. End-stage renal disease-Tuesday Thursday Saturday candidate [place]-appreciate nephrology input-would continue dialysis as per Dr. Scherrie Gerlach plan 5/29 and 5/30-will discontinue Lasix 80 twice a day-continue calcium acetate 4. Anemia  of renal disease-continue Aranesp per nephrology hemoglobin 8.1 5. Hypertension continue amlodipine 5 mg daily, clonidine 0.2 twice a day-hypotensive during her dialysis 5/31 6. Diabetes mellitus A1c 6.4 continue Lantus 16 units each bedtime  sliding scale coverage moderate-blood sugars between 1:30 and 160 7. ? Pneumonia-continue cefepime/Levaquin for now-x-ray ordered for 6/1 to rule out pneumonia. If no fever will discontinue antibiotics 8. Acute respiratory failure-probably related to noncompliance and missing dialysis-monitor.  Would discontinue opiates IV.  Her Percocet 10/325 every 6 when necessary 9. Hyperlipidemia continue Zocor 20 mg daily  Code Status: Full Family Communication: spoke c Dyke Maes Other [phone] on 5/30 Disposition Plan: Get PT/OT to see and review in am   Pleas Koch, MD  [facility] Pager [phone] 08/13/2012, 4:53 PM    LOS: 2 days

## 2012-08-14 ENCOUNTER — Inpatient Hospital Stay (HOSPITAL_COMMUNITY): Payer: Medicare HMO

## 2012-08-14 LAB — GLUCOSE, CAPILLARY
Glucose-Capillary: 132 mg/dL — ABNORMAL HIGH (ref 70–99)
Glucose-Capillary: 140 mg/dL — ABNORMAL HIGH (ref 70–99)
Glucose-Capillary: 168 mg/dL — ABNORMAL HIGH (ref 70–99)
Glucose-Capillary: 204 mg/dL — ABNORMAL HIGH (ref 70–99)

## 2012-08-14 LAB — FERRITIN: Ferritin: 1534 ng/mL — ABNORMAL HIGH (ref 10–291)

## 2012-08-14 MED ORDER — DICLOFENAC SODIUM 1 % TD GEL
2.0000 g | Freq: Four times a day (QID) | TRANSDERMAL | Status: DC
  Administered 2012-08-14 – 2012-08-16 (×7): 2 g via TOPICAL
  Filled 2012-08-14: qty 100

## 2012-08-14 NOTE — Progress Notes (Signed)
PROGRESS NOTE  Nicole Cox ZOX:[id number] DOB: 1931/09/24 DOA: 08/11/2012 PCP: Joana Reamer, MD  Brief narrative: 55 year African American female ESRD TTS [place], status post fall 08/11/2012 presented to [facility] long hospital and was discharged home-repeat a good secondary to severe bilateral hand pain but notable spinal cord abnormality at T4-T5. Neurology and neurosurgery were consulted and both did not think that this was anything acute and needed to be done although repeat MRI is warranted/EMG?carpal tunnel-she became hypoxic in the emergency room and was noted to have a pneumonia as well as dialysis.  Past medical history-As per Problem list Chart reviewed as below- None in   Consultants: Both spoke to the emergency room physician  Neurology   Neurosurgery  Procedures:  MRI spine  Antibiotics:  Cefepime 5/29  Vancomycin 5/30  Levaquin 5/29-5/30   Subjective  Pain still present in her hands. No fevers off of antibiotics No nausea no vomiting  Seen seems to be misunderstood hand surgeon's input into her hand pain and thinks that he wanted to amputate the finger  Objective    Interim History: Therapy recommends rehabilitation consult  Telemetry: Normal sinus rhythm  Objective: Filed Vitals:   08/13/12 1645 08/13/12 1730 08/13/12 2100 08/14/12 0500  BP: 147/61 130/55 148/52 139/49  Pulse: 88 82 86 67  Temp: 98.1 F (36.7 C) 99.1 F (37.3 C) 99.7 F (37.6 C) 98.2 F (36.8 C)  TempSrc: Oral Oral    Resp: 15  18 16   Height:      Weight: 70.1 kg (154 lb 8.7 oz)   70.761 kg (156 lb)  SpO2: 100% 95% 98% 100%    Intake/Output Summary (Last 24 hours) at 08/14/12 1328 Last data filed at 08/14/12 1300  Gross per 24 hour  Intake    243 ml  Output   2891 ml  Net  -2648 ml    Exam:  General: Alert pleasant oriented-abrasion to nose Cardiovascular: S1-S2 no murmur rub or gallop Respiratory:  Abdomen: Clinically clear Skin no lower  extremity-significantly painful and tender left index finger swelling of the arm/wrist noted Neuro grossly intact  Data Reviewed: Basic Metabolic Panel:  Recent Labs Lab 08/11/12 1508 08/12/12 0400 08/12/12 1827 08/13/12 1306  NA 135 131* 135 134*  K 4.3 5.7* 4.4 4.5  CL 94* 91* 96 96  CO2 26 25 28 26   GLUCOSE 111* 247* 194* 164*  BUN 42* 53* 20 40*  CREATININE 8.61* 9.16* 4.23* 6.43*  CALCIUM 9.6 9.8 9.2 9.1  PHOS  --   --  3.5 3.8   Liver Function Tests:  Recent Labs Lab 08/11/12 1508 08/12/12 0400 08/12/12 1827 08/13/12 1306  AST 29 28  --   --   ALT 28 28  --   --   ALKPHOS 87 88  --   --   BILITOT 0.3 0.3  --   --   PROT 7.2 7.4  --   --   ALBUMIN 3.1* 2.9* 2.9* 2.9*   No results found for this basename: LIPASE, AMYLASE,  in the last 168 hours No results found for this basename: AMMONIA,  in the last 168 hours CBC:  Recent Labs Lab 08/11/12 1836 08/13/12 0435  WBC 13.4* 14.4*  NEUTROABS 10.8*  --   HGB 8.7* 8.1*  HCT 26.9* 25.5*  MCV 104.3* 107.1*  PLT 280 224   Cardiac Enzymes: No results found for this basename: CKTOTAL, CKMB, CKMBINDEX, TROPONINI,  in the last 168 hours BNP: No components found with this  basename: POCBNP,  CBG:  Recent Labs Lab 08/13/12 1142 08/13/12 1712 08/13/12 2145 08/14/12 0730 08/14/12 1146  GLUCAP 132* 166* 124* 132* 140*    Recent Results (from the past 240 hour(s))  CULTURE, BLOOD (ROUTINE X 2)     Status: None   Collection Time    08/12/12 12:10 AM      Result Value Range Status   Specimen Description BLOOD LEFT ARM   Final   Special Requests     Final   Value: BOTTLES DRAWN AEROBIC AND ANAEROBIC 10CC,PT ON LEVAQUIN   Culture  Setup Time 08/12/2012 10:31   Final   Culture     Final   Value:        BLOOD CULTURE RECEIVED NO GROWTH TO DATE CULTURE WILL BE HELD FOR 5 DAYS BEFORE ISSUING A FINAL NEGATIVE REPORT   Report Status PENDING   Incomplete  CULTURE, BLOOD (ROUTINE X 2)     Status: None   Collection  Time    08/12/12 12:15 AM      Result Value Range Status   Specimen Description BLOOD LEFT HAND   Final   Special Requests BOTTLES DRAWN AEROBIC ONLY 10CC,PT ON LEVAQUIN   Final   Culture  Setup Time 08/12/2012 10:30   Final   Culture     Final   Value:        BLOOD CULTURE RECEIVED NO GROWTH TO DATE CULTURE WILL BE HELD FOR 5 DAYS BEFORE ISSUING A FINAL NEGATIVE REPORT   Report Status PENDING   Incomplete     Studies:              All Imaging reviewed and is as per above notation   Scheduled Meds: . amLODipine  5 mg Oral Daily  . aspirin EC  81 mg Oral Daily  . calcium acetate  1,334 mg Oral Q breakfast  . ceFEPime (MAXIPIME) IV  2 g Intravenous Q T,Th,Sa-HD  . cloNIDine  0.2 mg Oral BID  . darbepoetin (ARANESP) injection - DIALYSIS  40 mcg Intravenous Q Fri-HD  . diclofenac sodium  2 g Topical QID  . furosemide  80 mg Oral BID  . guaiFENesin  600 mg Oral BID  . insulin aspart  0-9 Units Subcutaneous TID WC  . insulin glargine  16 Units Subcutaneous QHS  . multivitamin  1 tablet Oral Daily  . pantoprazole  40 mg Oral Q1200  . simvastatin  20 mg Oral q1800  . sodium chloride  3 mL Intravenous Q12H  . vancomycin  750 mg Intravenous Q T,Th,Sa-HD   Continuous Infusions:    Assessment/Plan:  1. Mechanical fall with C1 nondisplaced anterior to the dens fracture indeterminate age + no mild ENT 4/5 level-patient does not require neurology input and I proceed discussed with Dr. Amada Jupiter who states she may need an MRI in the future one to 2 months as well as potential EMG studies for what appeared to be cervical radicular pains  2. Intractable left index finger pain. appreiate hand surgeon Dr. Merlyn Lot who has seen patient 5/31-unlikely any surgical cause for pain potentially calciphylaxis versus arthritis- treated trial dalteparin gel as per nephrology  3. End-stage renal disease-Tuesday Thursday Saturday candidate [place]-appreciate nephrology input-would continue dialysis as per  Dr. Scherrie Gerlach plan 5/29 and 5/30-will discontinue Lasix 80 twice a day-continue calcium acetate 4. Abnormality at T./T5 on MRI-repeat MRI in 2-3 months to rule out cancerous issue 5. Anemia of renal disease-continue Aranesp per nephrology hemoglobin 8.1-repeat labs in a.m.  6. Hypertension continue amlodipine 5 mg daily, clonidine 0.2 twice a day-hypotensive at dialysis 5/31 this resolved  7. Diabetes mellitus A1c 6.4 continue Lantus 16 units each bedtime sliding scale coverage moderate-blood sugars between 130 and 150 8. ? Pneumonia-antibiotics discontinued. Await repeat chest x-ray 60/1  9. Acute respiratory failure-probably related to noncompliance and missing dialysis-monitor.  Would discontinue opiates IV.  Her Percocet 10/325 every 6 when necessary-get the saturation screen to determine oxygen he  10. Hyperlipidemia continue Zocor 20 mg daily  Code Status: Full Family Communication: spoke c daughter personally in room Disposition Plan: ? CIR  Pleas Koch, MD  [facility] Pager [phone] 08/14/2012, 1:28 PM    LOS: 3 days

## 2012-08-14 NOTE — Care Management Note (Signed)
    Page 1 of 2   08/16/2012     4:12:49 PM   CARE MANAGEMENT NOTE 08/16/2012  Patient:  AVRI, PAIVA   Account Number:  [id number]  Date Initiated:  08/14/2012  Documentation initiated by:  GRAVES-BIGELOW,Akirah Storck  Subjective/Objective Assessment:   Pt admitted with Hand pain. Had low 02 sats in ed. Initiated on iv abx therapy for HCAP.     Action/Plan:   CM will conitnue to monitor for disposition needs.   Anticipated DC Date:  08/16/2012   Anticipated DC Plan:  IP REHAB FACILITY      DC Planning Services  CM consult      Choice offered to / List presented to:  C-4 Adult Children        HH arranged  HH-1 RN  HH-10 DISEASE MANAGEMENT  HH-2 PT  HH-3 OT  HH-4 NURSE'S AIDE  HH-6 SOCIAL WORKER      HH agency  [facility]   Status of service:  Completed, signed off Medicare Important Message given?   (If response is "NO", the following Medicare IM given date fields will be blank) Date Medicare IM given:   Date Additional Medicare IM given:    Discharge Disposition:  HOME W HOME HEALTH SERVICES  Per UR Regulation:  Reviewed for med. necessity/level of care/duration of stay  If discussed at Long Length of Stay Meetings, dates discussed:    Comments:  08-15-12  [address] [place], [place] [phone] CM did speak to family and they are agreeable to [facility] services with [facility]. CM did make referral for services and SOC to begin within 24-48 hours post d/c.  08-15-12 1645 Gerome Apley [phone] CM did speak to Dyke Maes [phone] daughter in reference to if pt is not a candidate for CIR or if insuance does not approve. If not approved for CIR daughter is agreeable to [facility] services with [facility] RN,PT, OT  and Aide. Pt will be d/c to daughters address if plan is for home. No referral made at this time. Pending consult for CIR. CM will f/u in am.

## 2012-08-14 NOTE — Progress Notes (Signed)
[facility]  Subjective:  Awake, alert, splint off L hand.  She was seen by Dr. Karlyn Agee yesterday Dialyzed yesterday to weight of 70 kg (EDW 72 kg as outpatient)   Objective: Vital signs in last 24 hours: Blood pressure 139/49, pulse 67, temperature 98.2 F (36.8 C), temperature source Oral, resp. rate 16, height 5\' 3"  (1.6 m), weight 70.761 kg (156 lb), SpO2 100.00%.    PHYSICAL EXAM General--awake, alert. Not using 02. Aspen collar on Chest--clear Heart--no rub Abd--nontender Extr--no edema, AVG patent L upper arm             L wrist still tender  Lab Results:   Recent Labs Lab 08/12/12 0400 08/12/12 1827 08/13/12 1306  NA 131* 135 134*  K 5.7* 4.4 4.5  CL 91* 96 96  CO2 25 28 26   BUN 53* 20 40*  CREATININE 9.16* 4.23* 6.43*  GLUCOSE 247* 194* 164*  CALCIUM 9.8 9.2 9.1  PHOS  --  3.5 3.8     Recent Labs  08/11/12 1836 08/13/12 0435  WBC 13.4* 14.4*  HGB 8.7* 8.1*  HCT 26.9* 25.5*  PLT 280 224     I have reviewed the patient's current medications. Scheduled: . amLODipine  5 mg Oral Daily  . aspirin EC  81 mg Oral Daily  . calcium acetate  1,334 mg Oral Q breakfast  . ceFEPime (MAXIPIME) IV  2 g Intravenous Q T,Th,Sa-HD  . cloNIDine  0.2 mg Oral BID  . darbepoetin (ARANESP) injection - DIALYSIS  40 mcg Intravenous Q Fri-HD  . diclofenac sodium  2 g Topical QID  . furosemide  80 mg Oral BID  . guaiFENesin  600 mg Oral BID  . insulin aspart  0-9 Units Subcutaneous TID WC  . insulin glargine  16 Units Subcutaneous QHS  . multivitamin  1 tablet Oral Daily  . pantoprazole  40 mg Oral Q1200  . simvastatin  20 mg Oral q1800  . sodium chloride  3 mL Intravenous Q12H  . vancomycin  750 mg Intravenous Q T,Th,Sa-HD   Continuous:   Assessment/Plan: 1. Bilateral severe hand pain - seen by Dr. Merlyn Lot who rec NSAID.  Will try voltaren gel 2. Questionable - HCAP - per primary; has no cough or fever; will repeat CXR today. If CXR clears post  dialysis, the OK to d/c antibiotics 3. ESRD - On TTS at [facility] Hyperkalemia and hyponatremia - due to missed dialysis; only dialyzes for 3 hours, though has adequate kinetics with this time; plan HD Tuesday to get back on schedule. 4. Hypertension/volume - BP 139/49 today. Weight post HD yesterday 70 kg (new EDW in my opinion) 5.  Anemia - stable consistant with outpatient Hgb, though it is low; prior ESA show Epo 6000 in April, converted to Aranesp 25 and was due to increase to 40 5/29 - Aranesp 40/week.  Will check Fe studies today 6. Metabolic bone disease - vitmain D not needed; P well controlled as outpt; on 1 phoslo ac tid 7. Nutrition - renal diet + vitamin       LOS: 3 days   Jawaan Adachi F 08/14/2012,12:18 PM   .labalb

## 2012-08-14 NOTE — Progress Notes (Signed)
UR Completed Abdoul Encinas Graves-Bigelow, RN,BSN [phone]  

## 2012-08-14 NOTE — Progress Notes (Signed)
Pt ambulated 81ft on R/A SAO2 97-100% Sl SOB with exertion. Up in chair most of day.

## 2012-08-15 DIAGNOSIS — S12000A Unspecified displaced fracture of first cervical vertebra, initial encounter for closed fracture: Secondary | ICD-10-CM

## 2012-08-15 DIAGNOSIS — W19XXXA Unspecified fall, initial encounter: Secondary | ICD-10-CM

## 2012-08-15 LAB — RENAL FUNCTION PANEL
CO2: 26 mEq/L (ref 19–32)
Chloride: 95 mEq/L — ABNORMAL LOW (ref 96–112)
GFR calc Af Amer: 6 mL/min — ABNORMAL LOW (ref >90)
GFR calc non Af Amer: 5 mL/min — ABNORMAL LOW (ref >90)
Glucose, Bld: 170 mg/dL — ABNORMAL HIGH (ref 70–99)
Sodium: 133 mEq/L — ABNORMAL LOW (ref 135–145)

## 2012-08-15 LAB — CBC
Hemoglobin: 8.6 g/dL — ABNORMAL LOW (ref 12.0–15.0)
Platelets: 219 10*3/uL (ref 150–400)
RBC: 2.53 MIL/uL — ABNORMAL LOW (ref 3.87–5.11)
WBC: 11.7 10*3/uL — ABNORMAL HIGH (ref 4.0–10.5)

## 2012-08-15 LAB — GLUCOSE, CAPILLARY: Glucose-Capillary: 153 mg/dL — ABNORMAL HIGH (ref 70–99)

## 2012-08-15 MED ORDER — GABAPENTIN 100 MG PO CAPS
100.0000 mg | ORAL_CAPSULE | Freq: Every day | ORAL | Status: DC
  Administered 2012-08-15: 100 mg via ORAL
  Filled 2012-08-15 (×2): qty 1

## 2012-08-15 NOTE — Progress Notes (Signed)
Physical Therapy Treatment Patient Details Name: Nicole Cox MRN: [id number] DOB: Aug 27, 1931 Today's Date: 08/15/2012 Time: 4098-1191 PT Time Calculation (min): 14 min  PT Assessment / Plan / Recommendation Comments on Treatment Session  Pt is pleasant and required some encouragement to participate in PT 2* fear of falling again. Continues to have significant pain in B hands, L more than R. Unable to functionally use L hand. MOd assist for supine to sit. Pt ambulated 80' with min assist, no device. LLE buckled x 1 while walking.     Follow Up Recommendations  CIR     Does the patient have the potential to tolerate intense rehabilitation     Barriers to Discharge        Equipment Recommendations   (will continue to assess)    Recommendations for Other Services Rehab consult  Frequency Min 4X/week   Plan Discharge plan remains appropriate    Precautions / Restrictions Precautions Precautions: Fall;Cervical Required Braces or Orthoses: Cervical Brace Cervical Brace: Hard collar Restrictions Weight Bearing Restrictions: No   Pertinent Vitals/Pain **25/10 pain L hand Pt premedicated*    Mobility  Bed Mobility Bed Mobility: Supine to Sit;Sitting - Scoot to Edge of Bed Supine to Sit: 3: Mod assist;HOB elevated Sitting - Scoot to [place] of Bed: 4: Min assist Details for Bed Mobility Assistance: HOB at 25*, pt pushed with elbows but still required mod A to elevate trunk, pad used to scoot to EOB Transfers Transfers: Sit to Stand;Stand to [employer] Transfers Sit to Stand: From bed;4: Min assist Stand to Sit: To chair/3-in-1;4: Min assist Details for Transfer Assistance: (A) to initiate transfer and slowly descend to recliner with cues for technique without use of UE to assist Ambulation/Gait Ambulation/Gait Assistance: 4: Min assist Ambulation Distance (Feet): 80 Feet Assistive device: 1 person hand held assist (supported pt under L elbow 2* hand pain) Ambulation/Gait  Assistance Details: LLE buckled x 1, pt able to maintain balance with min A Gait Pattern: Within Functional Limits Gait velocity: decreased    Exercises     PT Diagnosis:    PT Problem List:   PT Treatment Interventions:     PT Goals Acute Rehab PT Goals PT Goal Formulation: With patient Time For Goal Achievement: 08/27/12 Potential to Achieve Goals: Good Pt will go Supine/Side to Sit: with supervision PT Goal: Supine/Side to Sit - Progress: Progressing toward goal Pt will go Sit to Supine/Side: with supervision Pt will go Sit to Stand: with supervision PT Goal: Sit to Stand - Progress: Progressing toward goal Pt will go Stand to Sit: with supervision PT Goal: Stand to Sit - Progress: Progressing toward goal Pt will Transfer Bed to Chair/Chair to Bed: with supervision Pt will Ambulate: >150 feet;with least restrictive assistive device;with supervision PT Goal: Ambulate - Progress: Progressing toward goal  Visit Information  Last PT Received On: 08/15/12 Assistance Needed: +1    Subjective Data  Subjective: It hurts to touch anything with my hands especially my left hand. That left hand just isn't getting any better.  Patient Stated Goal: To go home   Cognition  Cognition Arousal/Alertness: Awake/alert Behavior During Therapy: WFL for tasks assessed/performed Overall Cognitive Status: Within Functional Limits for tasks assessed    Balance     End of Session PT - End of Session Equipment Utilized During Treatment: Cervical collar;Gait belt Activity Tolerance: Patient limited by pain Patient left: in chair;with call bell/phone within reach Nurse Communication: Mobility status   GP     Ralene Bathe  [place]ler 08/15/2012, 9:23 AM [phone]

## 2012-08-15 NOTE — Progress Notes (Signed)
Rehab Admissions Coordinator Note:  Patient was screened by Clois Dupes for appropriateness for an Inpatient Acute Rehab Consult. Rehab consult is pending today.  Clois Dupes 08/15/2012, 7:54 AM  I can be reached at [phone].

## 2012-08-15 NOTE — Progress Notes (Signed)
Patient ID: Nicole Cox, female   DOB: 18-Dec-1931, 79 y.o.   MRN: [id number]  [facility] Progress Note    Subjective:   Still complaining of left finger pain   Objective:   BP 145/52  Pulse 76  Temp(Src) 98.5 F (36.9 C) (Oral)  Resp 18  Ht 5\' 3"  (1.6 m)  Wt 70.761 kg (156 lb)  BMI 27.64 kg/m2  SpO2 99%  Intake/Output: I/O last 3 completed shifts: In: 843 [P.O.:840; I.V.:3] Out: 400 [Urine:400]   Intake/Output this shift:  Total I/O In: 120 [P.O.:120] Out: -  Weight change:   Physical Exam: Gen:WE elderly AAF in NAD CVS:no rub Resp:CTA NWG:NFAOZH Ext:no edema. LUE AVF +T/B, +radial pulse in left wrist, reduced ulnar pulse  Labs: BMET  Recent Labs Lab 08/11/12 1508 08/12/12 0400 08/12/12 1827 08/13/12 1306 08/15/12 0530  NA 135 131* 135 134* 133*  K 4.3 5.7* 4.4 4.5 4.3  CL 94* 91* 96 96 95*  CO2 26 25 28 26 26   GLUCOSE 111* 247* 194* 164* 170*  BUN 42* 53* 20 40* 47*  CREATININE 8.61* 9.16* 4.23* 6.43* 7.28*  ALBUMIN 3.1* 2.9* 2.9* 2.9* 2.9*  CALCIUM 9.6 9.8 9.2 9.1 8.9  PHOS  --   --  3.5 3.8 3.8   CBC  Recent Labs Lab 08/11/12 1836 08/13/12 0435 08/15/12 0530  WBC 13.4* 14.4* 11.7*  NEUTROABS 10.8*  --   --   HGB 8.7* 8.1* 8.6*  HCT 26.9* 25.5* 26.4*  MCV 104.3* 107.1* 104.3*  PLT 280 224 219    @IMGRELPRIORS @ Medications:    . amLODipine  5 mg Oral Daily  . aspirin EC  81 mg Oral Daily  . calcium acetate  1,334 mg Oral Q breakfast  . cloNIDine  0.2 mg Oral BID  . darbepoetin (ARANESP) injection - DIALYSIS  40 mcg Intravenous Q Fri-HD  . diclofenac sodium  2 g Topical QID  . furosemide  80 mg Oral BID  . guaiFENesin  600 mg Oral BID  . insulin aspart  0-9 Units Subcutaneous TID WC  . insulin glargine  16 Units Subcutaneous QHS  . multivitamin  1 tablet Oral Daily  . pantoprazole  40 mg Oral Q1200  . simvastatin  20 mg Oral q1800  . sodium chloride  3 mL Intravenous Q12H     Assessment/ Plan:     1. Bilateral severe hand pain - seen by Dr. Merlyn Lot who rec NSAID.did "ok" with voltaren gel.  Doubt steal syndrome as it affects both hands. 2. Questionable - HCAP - per primary; has no cough or fever; repeat CXR 08/14/12 revealed improved infiltrates.  OK to d/c antibiotics 3. ESRD - On TTS at [facility] only dialyzes for 3 hours, though has adequate kinetics with this time; plan HD Tuesday to get back on schedule. 4. Hypertension/volume - BP 145/52 today. Weight post HD 70 kg (new EDW in my opinion) 5. Anemia - stable consistant with outpatient Hgb, though it is low; prior ESA show Epo 6000 in April, converted to Aranesp 25 and was due to increase to 40 5/29 - Aranesp 40/week. Will check Fe studies today 6. Metabolic bone disease - vitmain D not needed; P well controlled as outpt; on 1 phoslo ac tid 7. Nutrition - renal diet + vitamin 8. Dispo- per primary svc.   9. C1 fracture- wearing collar.   Alonza Knisley A 08/15/2012, 12:14 PM

## 2012-08-15 NOTE — Consult Note (Signed)
Physical Medicine and Rehabilitation Consult Reason for Consult: C1 fracture Referring Physician: [facility]   HPI: Nicole Cox is a 79 y.o. right-handed female with end-stage renal disease/hemodialysis, systolic congestive heart failure. Admitted 08/11/2012 after a fall landing on her face tried to brace herself with her arms. X-rays and imaging revealed an nondisplaced C1 fracture as well as thoracic T4-5 stenosis without cord compression. Patient was fitted with a hard cervical collar and conservative care. Complaints of bilateral hand pain orthopedic services consulted no fractures identified on x-rays consideration of thumb spica splints for pain control. Hospital course question pneumonia maintained on antibiotic therapy. Hemodialysis ongoing as per renal services. Physical therapy evaluation completed 08/13/2012 with recommendations of physical medicine rehabilitation consult to consider inpatient rehabilitation services.   Review of Systems  Respiratory: Positive for shortness of breath.   Cardiovascular: Positive for leg swelling.  Musculoskeletal: Positive for myalgias and falls.  Neurological: Positive for weakness.  All other systems reviewed and are negative.   Past Medical History  Diagnosis Date  . Renal disorder   . Diabetes mellitus without complication   . Hypertension   . Heart disease   . COPD (chronic obstructive pulmonary disease)   . Stroke   . CHF (congestive heart failure)   . Coronary artery disease    Past Surgical History  Procedure Laterality Date  . Abdominal hysterectomy    . Cholecystectomy    . Tonsillectomy     Family History  Problem Relation Age of Onset  . Heart disease Mother   . Hypertension Mother   . Heart disease Father   . Hypertension Father   . Heart disease Sister   . Diabetes Sister   . Stroke Sister   . Kidney disease Sister   . Hypertension Sister   . Heart disease Sister   . Kidney disease Sister   . Hypertension Sister   .  Kidney disease Daughter   . Hypertension Daughter    Social History:  reports that she has quit smoking. She does not have any smokeless tobacco history on file. She reports that she does not drink alcohol or use illicit drugs. Allergies: No Known Allergies Medications Prior to Admission  Medication Sig Dispense Refill  . acetaminophen (TYLENOL) 500 MG tablet Take 500 mg by mouth every 6 (six) hours as needed for pain.      Marland Kitchen amLODipine (NORVASC) 5 MG tablet Take 5 mg by mouth daily.      Marland Kitchen aspirin EC 81 MG tablet Take 81 mg by mouth daily.      . calcium acetate (PHOSLO) 667 MG capsule Take 1,334 mg by mouth daily.      . cloNIDine (CATAPRES) 0.2 MG tablet Take 0.2 mg by mouth 2 (two) times daily.      . furosemide (LASIX) 80 MG tablet Take 80 mg by mouth 2 (two) times daily. One every other day      . insulin aspart (NOVOLOG) 100 UNIT/ML injection Inject 6 Units into the skin 3 (three) times daily with meals.      . insulin glargine (LANTUS) 100 UNIT/ML injection Inject 16 Units into the skin at bedtime.      . pravastatin (PRAVACHOL) 40 MG tablet Take 40 mg by mouth daily.      Marland Kitchen PRESCRIPTION MEDICATION Inhale 2 puffs into the lungs daily as needed (inhailer).      . traMADol (ULTRAM) 50 MG tablet Take 50 mg by mouth every 8 (eight) hours as needed for pain.       Marland Kitchen  Vitamin D, Ergocalciferol, (DRISDOL) 50000 UNITS CAPS Take 50,000 Units by mouth daily.        Home: Home Living Lives With: Spouse;Son (adopted son which is 54 y/o) Available Help at Discharge: Available 24 hours/day (plans to d/c to [facility] home) Type of Home: House Home Access: Stairs to enter [employer] of Steps: 5 Entrance Stairs-Rails: Left Home Layout: Two level;Able to live on main level with bedroom/bathroom Bathroom Shower/Tub: Engineer, manufacturing systems: Standard Bathroom Accessibility: Yes How Accessible: Accessible via walker Home Adaptive Equipment: Walker - four wheeled;Straight cane   Functional History: Prior Function Able to Take Stairs?: Yes Driving: No Vocation: Retired Functional Status:  Mobility: Bed Mobility Bed Mobility: Supine to Sit;Sitting - Scoot to Edge of Bed Supine to Sit: 3: Mod assist Sitting - Scoot to Edge of Bed: 3: Mod assist Transfers Transfers: Sit to Stand;Stand to Sit;Stand Pivot Transfers Sit to Stand: 3: Mod assist;From bed Stand to Sit: 3: Mod assist;To chair/3-in-1 Stand Pivot Transfers: 4: Min assist Ambulation/Gait Ambulation/Gait Assistance: Not tested (comment)    ADL:    Cognition: Cognition Overall Cognitive Status: Within Functional Limits for tasks assessed Arousal/Alertness: Awake/alert Orientation Level: Oriented X4 Cognition Arousal/Alertness: Awake/alert Behavior During Therapy: WFL for tasks assessed/performed Overall Cognitive Status: Within Functional Limits for tasks assessed  Blood pressure 140/46, pulse 66, temperature 97.8 F (36.6 C), temperature source Oral, resp. rate 18, height 5\' 3"  (1.6 m), weight 70.761 kg (156 lb), SpO2 100.00%. Physical Exam  Vitals reviewed. Constitutional: She is oriented to person, place, and time.  HENT:  Head: Normocephalic.  Eyes:  Pupils reactive to light  Neck:  Cervical collar intact  Cardiovascular: Normal rate and regular rhythm.   Pulmonary/Chest: Effort normal and breath sounds normal. No respiratory distress.  Abdominal: Soft. Bowel sounds are normal. She exhibits no distension.  Neurological: She is alert and oriented to person, place, and time.  Moves al 4's. No gross sensory deficits. Wouldn't let me touch hands as they are tender. Can make 1/2 fist Right, 1/8 fist on left. Anxious but has reasnoable insight and awareness.   Skin: Skin is warm and dry.  Abrasions on face, limbs.   Psychiatric: She has a normal mood and affect. Her behavior is normal. Judgment and thought content normal.    Results for orders placed during the hospital encounter of  08/11/12 (from the past 24 hour(s))  GLUCOSE, CAPILLARY     Status: Abnormal   Collection Time    08/14/12  7:30 AM      Result Value Range   Glucose-Capillary 132 (*) 70 - 99 mg/dL   Comment 1 Notify RN    GLUCOSE, CAPILLARY     Status: Abnormal   Collection Time    08/14/12 11:46 AM      Result Value Range   Glucose-Capillary 140 (*) 70 - 99 mg/dL   Comment 1 Notify RN    FERRITIN     Status: Abnormal   Collection Time    08/14/12 12:52 PM      Result Value Range   Ferritin 1534 (*) 10 - 291 ng/mL  IRON AND TIBC     Status: Abnormal   Collection Time    08/14/12 12:52 PM      Result Value Range   Iron 64  42 - 135 ug/dL   TIBC 621 (*) 308 - 657 ug/dL   Saturation Ratios 26  20 - 55 %   UIBC 180  125 - 400 ug/dL  GLUCOSE,  CAPILLARY     Status: Abnormal   Collection Time    08/14/12  4:33 PM      Result Value Range   Glucose-Capillary 168 (*) 70 - 99 mg/dL   Comment 1 Notify RN    GLUCOSE, CAPILLARY     Status: Abnormal   Collection Time    08/14/12  8:18 PM      Result Value Range   Glucose-Capillary 204 (*) 70 - 99 mg/dL  CBC     Status: Abnormal   Collection Time    08/15/12  5:30 AM      Result Value Range   WBC 11.7 (*) 4.0 - 10.5 K/uL   RBC 2.53 (*) 3.87 - 5.11 MIL/uL   Hemoglobin 8.6 (*) 12.0 - 15.0 g/dL   HCT 16.1 (*) 09.6 - 04.5 %   MCV 104.3 (*) 78.0 - 100.0 fL   MCH 34.0  26.0 - 34.0 pg   MCHC 32.6  30.0 - 36.0 g/dL   RDW 40.9  81.1 - 91.4 %   Platelets 219  150 - 400 K/uL   Dg Chest 2 View  08/14/2012   *RADIOLOGY REPORT*  Clinical Data: Status post dialysis.  Evaluate "infiltrates."  CHEST - 2 VIEW  Comparison: 08/12/2012  Findings: Patient rotated to the right.  Mild cardiomegaly. Probable small right pleural effusion, slightly increased. No pneumothorax.  Improved interstitial edema/venous congestion. Decreased right base air space disease.  Possible right-sided pleural plaque posteriorly versus artifact on the lateral view.  IMPRESSION: Cardiomegaly  with improvement in interstitial edema/mild venous congestion.  Improved right base air space disease, suspicious for infection. Recommend radiographic follow-up until clearing.  Probable increasing small right pleural effusion; cannot exclude a pleural plaque posteriorly on the lateral view. Recommend attention on follow-up.   Original Report Authenticated By: Jeronimo Greaves, M.D.    Assessment/Plan: Diagnosis: C1 fx, soft tissue injuries to hands/wrists after fall. Anxiety of accident, ?PTSD 1. Does the need for close, 24 hr/day medical supervision in concert with the patient's rehab needs make it unreasonable for this patient to be served in a less intensive setting? Yes 2. Co-Morbidities requiring supervision/potential complications: esrd, DM, copd, cad 3. Due to bladder management, bowel management, safety, skin/wound care, disease management, medication administration, pain management and patient education, does the patient require 24 hr/day rehab nursing? Yes 4. Does the patient require coordinated care of a physician, rehab nurse, PT (1-2 hrs/day, 5 days/week) and OT (1-2 hrs/day, 5 days/week) to address physical and functional deficits in the context of the above medical diagnosis(es)? Yes Addressing deficits in the following areas: balance, endurance, locomotion, strength, transferring, bowel/bladder control, bathing, dressing, feeding, grooming, toileting and pain mgt 5. Can the patient actively participate in an intensive therapy program of at least 3 hrs of therapy per day at least 5 days per week? Yes 6. The potential for patient to make measurable gains while on inpatient rehab is excellent and good 7. Anticipated functional outcomes upon discharge from inpatient rehab are supervision to mod I with PT, min assist+ with OT, n/a with SLP. 8. Estimated rehab length of stay to reach the above functional goals is: 1-2 weeks 9. Does the patient have adequate social supports to accommodate these  discharge functional goals? Yes 10. Anticipated D/C setting: Home 11. Anticipated post D/C treatments: HH therapy 12. Overall Rehab/Functional Prognosis: good  RECOMMENDATIONS: This patient's condition is appropriate for continued rehabilitative care in the following setting: CIR Patient has agreed to participate in recommended program.  Yes Note that insurance prior authorization may be required for reimbursement for recommended care.  Comment:Rehab RN to follow up.   Ranelle Oyster, MD, [place] [facility]     08/15/2012

## 2012-08-15 NOTE — Progress Notes (Signed)
PROGRESS NOTE  Nicole Cox UJW:[id number] DOB: 21-Dec-1931 DOA: 08/11/2012 PCP: Joana Reamer, MD  Brief narrative: 65 year African American female ESRD TTS [place], status post fall 08/11/2012 presented to [facility] long hospital and was discharged home-repeat a good secondary to severe bilateral hand pain but notable spinal cord abnormality at T4-T5. Neurology and neurosurgery were consulted and both did not think that this was anything acute and needed to be done although repeat MRI is warranted/EMG?carpal tunnel-she became hypoxic in the emergency room and was noted to have a pneumonia-repeat chest x-rays however subsequent to dialysis on sequential day showed resolution of the same and antibiotics were discontinued 08/14/12 Hand surgery was consulted because of her acute pain and the initial impression was arthritis versus calciphylaxis type pain which is nonsurgical  Past medical history-As per Problem list Chart reviewed as below- None in   Consultants: Both spoke to the emergency room physician  Neurology   Neurosurgery  Procedures:  MRI spine  Antibiotics:  Cefepime 5/29  Vancomycin 5/30  Levaquin 5/29-5/30   Subjective  Pain still present in her hands. No fevers off of antibiotics No nausea no vomiting  Doing well otherwise   Objective    Interim History: Therapy recommends rehabilitation consult  Telemetry: Normal sinus rhythm  Objective: Filed Vitals:   08/14/12 1345 08/14/12 2100 08/15/12 0500 08/15/12 1427  BP: 148/69 140/46 145/52 121/64  Pulse: 77 66 76 78  Temp: 98 F (36.7 C) 97.8 F (36.6 C) 98.5 F (36.9 C) 98.4 F (36.9 C)  TempSrc: Oral   Oral  Resp: 18 18 18 18   Height:      Weight:      SpO2: 100% 100% 99% 99%    Intake/Output Summary (Last 24 hours) at 08/15/12 1503 Last data filed at 08/15/12 0900  Gross per 24 hour  Intake    480 ml  Output    300 ml  Net    180 ml    Exam:  General: Alert pleasant oriented-abrasion  to nose Cardiovascular: S1-S2 no murmur rub or gallop Respiratory:  Abdomen: Clinically clear Skin no lower extremity-significantly painful and tender left index finger swelling of the arm/wrist noted Neuro grossly intact  Data Reviewed: Basic Metabolic Panel:  Recent Labs Lab 08/11/12 1508 08/12/12 0400 08/12/12 1827 08/13/12 1306 08/15/12 0530  NA 135 131* 135 134* 133*  K 4.3 5.7* 4.4 4.5 4.3  CL 94* 91* 96 96 95*  CO2 26 25 28 26 26   GLUCOSE 111* 247* 194* 164* 170*  BUN 42* 53* 20 40* 47*  CREATININE 8.61* 9.16* 4.23* 6.43* 7.28*  CALCIUM 9.6 9.8 9.2 9.1 8.9  PHOS  --   --  3.5 3.8 3.8   Liver Function Tests:  Recent Labs Lab 08/11/12 1508 08/12/12 0400 08/12/12 1827 08/13/12 1306 08/15/12 0530  AST 29 28  --   --   --   ALT 28 28  --   --   --   ALKPHOS 87 88  --   --   --   BILITOT 0.3 0.3  --   --   --   PROT 7.2 7.4  --   --   --   ALBUMIN 3.1* 2.9* 2.9* 2.9* 2.9*   No results found for this basename: LIPASE, AMYLASE,  in the last 168 hours No results found for this basename: AMMONIA,  in the last 168 hours CBC:  Recent Labs Lab 08/11/12 1836 08/13/12 0435 08/15/12 0530  WBC 13.4* 14.4* 11.7*  NEUTROABS 10.8*  --   --   HGB 8.7* 8.1* 8.6*  HCT 26.9* 25.5* 26.4*  MCV 104.3* 107.1* 104.3*  PLT 280 224 219   Cardiac Enzymes: No results found for this basename: CKTOTAL, CKMB, CKMBINDEX, TROPONINI,  in the last 168 hours BNP: No components found with this basename: POCBNP,  CBG:  Recent Labs Lab 08/14/12 1146 08/14/12 1633 08/14/12 2018 08/15/12 0734 08/15/12 1145  GLUCAP 140* 168* 204* 153* 147*    Recent Results (from the past 240 hour(s))  CULTURE, BLOOD (ROUTINE X 2)     Status: None   Collection Time    08/12/12 12:10 AM      Result Value Range Status   Specimen Description BLOOD LEFT ARM   Final   Special Requests     Final   Value: BOTTLES DRAWN AEROBIC AND ANAEROBIC 10CC,PT ON LEVAQUIN   Culture  Setup Time 08/12/2012  10:31   Final   Culture     Final   Value:        BLOOD CULTURE RECEIVED NO GROWTH TO DATE CULTURE WILL BE HELD FOR 5 DAYS BEFORE ISSUING A FINAL NEGATIVE REPORT   Report Status PENDING   Incomplete  CULTURE, BLOOD (ROUTINE X 2)     Status: None   Collection Time    08/12/12 12:15 AM      Result Value Range Status   Specimen Description BLOOD LEFT HAND   Final   Special Requests BOTTLES DRAWN AEROBIC ONLY 10CC,PT ON LEVAQUIN   Final   Culture  Setup Time 08/12/2012 10:30   Final   Culture     Final   Value:        BLOOD CULTURE RECEIVED NO GROWTH TO DATE CULTURE WILL BE HELD FOR 5 DAYS BEFORE ISSUING A FINAL NEGATIVE REPORT   Report Status PENDING   Incomplete     Studies:              All Imaging reviewed and is as per above notation   Scheduled Meds: . amLODipine  5 mg Oral Daily  . aspirin EC  81 mg Oral Daily  . calcium acetate  1,334 mg Oral Q breakfast  . cloNIDine  0.2 mg Oral BID  . darbepoetin (ARANESP) injection - DIALYSIS  40 mcg Intravenous Q Fri-HD  . diclofenac sodium  2 g Topical QID  . furosemide  80 mg Oral BID  . guaiFENesin  600 mg Oral BID  . insulin aspart  0-9 Units Subcutaneous TID WC  . insulin glargine  16 Units Subcutaneous QHS  . multivitamin  1 tablet Oral Daily  . pantoprazole  40 mg Oral Q1200  . simvastatin  20 mg Oral q1800  . sodium chloride  3 mL Intravenous Q12H   Continuous Infusions:    Assessment/Plan:  1. Mechanical fall with C1 nondisplaced anterior to the dens fracture indeterminate age + no mild ENT 4/5 level-patient does not require neurology input and I proceed discussed with Dr. Amada Jupiter who states she may need an MRI in the future one to 2 months as well as potential EMG studies for what appeared to be cervical radicular pains  2. Intractable left index finger pain-appreciate hand surgeon Dr. Merlyn Lot who has seen patient 5/31-unlikely any surgical cause for pain potentially calciphylaxis versus arthritis- treated trialed Voltaren  gel as per nephrology.  Will trial Neurontin 100 qhs and see if any benefit 3. End-stage renal disease-Tuesday Thursday Saturday candidate [place]-appreciate nephrology input-would continue dialysis per  renal.  discontinued Lasix 80 twice a day-continue calcium acetate 4. Abnormality at T4/T5 on MRI-repeat MRI in 2-3 months to rule out cancerous issue 5. Anemia of renal disease +/-macrocytosis-continue Aranesp per nephrology- hemoglobin now 8.6 6. Hypertension continue amlodipine 5 mg daily, clonidine 0.2 twice a day-hypotensive at dialysis 5/31 this resolved  7. Diabetes mellitus A1c 6.4 continue Lantus 16 units each bedtime sliding scale coverage moderate-blood sugars between 130 and 150 8. ? Pneumonia-antibiotics discontinued. Await repeat chest x-ray 60/1  9. Acute respiratory failure-probably related to noncompliance and missing dialysis-monitor.  discontinued opiates IV.  Her Percocet 10/325 every 6 when necessary-sats normal  10. Hyperlipidemia continue Zocor 20 mg daily  Code Status: Full Family Communication: spoke c daughter personally in room Disposition Plan: ? CIR  Pleas Koch, MD  [facility] Pager [phone] 08/15/2012, 3:03 PM    LOS: 4 days

## 2012-08-15 NOTE — Evaluation (Signed)
Occupational Therapy Evaluation Patient Details Name: Nicole Cox MRN: [id number] DOB: Jun 24, 1931 Today's Date: 08/15/2012 Time: 1100-1145 OT Time Calculation (min): 45 min  OT Assessment / Plan / Recommendation Clinical Impression  Pt admitted after fall in grocery store parking lot in which she sustained a non displaced C1 fracture.  Pt with painful UEs with weakness and limited functional use L>R limiting ability to perform ADL. Per ortho, pain due to degeneration and calcification of radial and ulnar arteries and blood vessels in her hands which may be associated with ESRD.  Pt also with impaired balance and mobility.  Will follow to address UE concerns and for ADL training as tolerated by pt.      OT Assessment  Patient needs continued OT Services    Follow Up Recommendations  CIR    Barriers to Discharge      Equipment Recommendations       Recommendations for Other Services Rehab consult  Frequency  Min 2X/week    Precautions / Restrictions Precautions Precautions: Fall;Cervical Required Braces or Orthoses: Cervical Brace Cervical Brace: Hard collar Restrictions Weight Bearing Restrictions: No   Pertinent Vitals/Pain Severe in L hand, moderate to severe in R, repositioned    ADL  Eating/Feeding: Minimal assistance Where Assessed - Eating/Feeding: Chair Grooming: Maximal assistance;Wash/dry hands Where Assessed - Grooming: Unsupported sitting Upper Body Bathing: +1 Total assistance Where Assessed - Upper Body Bathing: Unsupported sitting Lower Body Bathing: +1 Total assistance Where Assessed - Lower Body Bathing: Unsupported sitting Upper Body Dressing: +1 Total assistance Where Assessed - Upper Body Dressing: Unsupported sitting Lower Body Dressing: +1 Total assistance Where Assessed - Lower Body Dressing: Unsupported sitting;Supported sit to stand Toilet Transfer: Minimal assistance Toilet Transfer Method: Sit to stand Toilet Transfer Equipment: Raised toilet  seat with arms (or 3-in-1 over toilet) Toileting - Clothing Manipulation and Hygiene: +1 Total assistance Where Assessed - Toileting Clothing Manipulation and Hygiene: Standing Equipment Used: Gait belt Transfers/Ambulation Related to ADLs: min assist without a device, pt with tendency for L knee to buckle.  Unable to use device due to UE pain ADL Comments: Pt is not able to use her L hand at all due to pain, limited use of her R for self feeding.  Issued red foam for utensils.    OT Diagnosis: Generalized weakness;Acute pain  OT Problem List: Decreased strength;Decreased range of motion;Decreased activity tolerance;Impaired balance (sitting and/or standing);Decreased coordination;Decreased knowledge of use of DME or AE;Obesity;Impaired UE functional use;Pain OT Treatment Interventions: Self-care/ADL training;Therapeutic exercise;DME and/or AE instruction;Therapeutic activities;Patient/family education   OT Goals Acute Rehab OT Goals OT Goal Formulation: With patient Time For Goal Achievement: 08/29/12 Potential to Achieve Goals: Good ADL Goals Pt Will Perform Eating: with set-up;Sitting, edge of bed;with adaptive utensils ADL Goal: Eating - Progress: Goal set today Pt Will Perform Grooming: Standing at sink;with min assist ADL Goal: Grooming - Progress: Goal set today Pt Will Perform Upper Body Bathing: with min assist;Sitting, chair ADL Goal: Upper Body Bathing - Progress: Goal set today Pt Will Perform Upper Body Dressing: with set-up;Sitting, bed ADL Goal: Upper Body Dressing - Progress: Goal set today Pt Will Transfer to Toilet: with supervision;Ambulation;3-in-1 ADL Goal: Toilet Transfer - Progress: Goal set today Pt Will Perform Toileting - Clothing Manipulation: with supervision;Standing ADL Goal: Toileting - Clothing Manipulation - Progress: Goal set today Pt Will Perform Toileting - Hygiene: with supervision;Sit to stand from 3-in-1/toilet ADL Goal: Toileting - Hygiene -  Progress: Goal set today Miscellaneous OT Goals Miscellaneous OT Goal #1: Pt  will demonstrate 4/5 strength in R UE and 3/5 strength in L. OT Goal: Miscellaneous Goal #1 - Progress: Goal set today Miscellaneous OT Goal #2: Pt will be independent in theraputty, squeeze ball, and AROM exercise program B UEs. OT Goal: Miscellaneous Goal #2 - Progress: Goal set today  Visit Information  Last OT Received On: 08/15/12 Assistance Needed: +1    Subjective Data  Subjective: "Don't touch my hands, I will do it." Patient Stated Goal: Regain use of hands.   Prior Functioning     Home Living Lives With: Spouse;Son Available Help at Discharge: Available 24 hours/day Type of Home: House Home Access: Stairs to enter [employer] of Steps: 5 Entrance Stairs-Rails: Left Home Layout: Two level;Able to live on main level with bedroom/bathroom Bathroom Shower/Tub: Engineer, manufacturing systems: Standard Bathroom Accessibility: Yes How Accessible: Accessible via walker Home Adaptive Equipment: Walker - four wheeled;Straight cane Prior Function Level of Independence: Independent with assistive device(s) Able to Take Stairs?: Yes Driving: No Vocation: Retired Musician: No difficulties Dominant Hand: Right         Vision/Perception Vision - History Patient Visual Report: No change from baseline   Cognition  Cognition Arousal/Alertness: Awake/alert Behavior During Therapy: WFL for tasks assessed/performed Overall Cognitive Status: Within Functional Limits for tasks assessed    Extremity/Trunk Assessment Right Upper Extremity Assessment RUE ROM/Strength/Tone: Deficits;Due to pain;Unable to fully assess RUE ROM/Strength/Tone Deficits: full AROM-elbow, forearm, wrist, shoulder FF to 90 degrees, nearly able to fully flex and extend fingers RUE Sensation: Deficits RUE Sensation Deficits: hypersensitive RUE Coordination: Deficits RUE Coordination Deficits:  drops items, gross grasp only Left Upper Extremity Assessment LUE ROM/Strength/Tone: Deficits;Unable to fully assess;Due to pain LUE ROM/Strength/Tone Deficits: Full elbow AROM, shoulder FF to 45 degrees, supination 45 degrees, wrist extension to neutral, slight flexion and extension of fingers LUE Sensation: Deficits LUE Sensation Deficits: hypersensitive LUE Coordination: Deficits LUE Coordination Deficits: no functional use     Mobility Bed Mobility Bed Mobility: Not assessed  Transfers Sit to Stand: From bed;4: Min assist Stand to Sit: To chair/3-in-1;4: Min assist Details for Transfer Assistance: (A) to initiate transfer and slowly descend to recliner with cues for technique without use of UE to assist     Exercise     Balance     End of Session OT - End of Session Equipment Utilized During Treatment: Gait belt (squeeze ball, putty, red foam build ups) Activity Tolerance: Patient limited by pain Patient left: in chair;with call bell/phone within reach;with family/visitor present  GO     Evern Bio 08/15/2012, 12:16 PM [phone]

## 2012-08-16 LAB — RENAL FUNCTION PANEL
BUN: 62 mg/dL — ABNORMAL HIGH (ref 6–23)
CO2: 25 mEq/L (ref 19–32)
Chloride: 95 mEq/L — ABNORMAL LOW (ref 96–112)
Creatinine, Ser: 9.09 mg/dL — ABNORMAL HIGH (ref 0.50–1.10)
GFR calc non Af Amer: 4 mL/min — ABNORMAL LOW (ref >90)
Glucose, Bld: 120 mg/dL — ABNORMAL HIGH (ref 70–99)

## 2012-08-16 LAB — GLUCOSE, CAPILLARY: Glucose-Capillary: 142 mg/dL — ABNORMAL HIGH (ref 70–99)

## 2012-08-16 MED ORDER — PENTAFLUOROPROP-TETRAFLUOROETH EX AERO: 1.0000 "application " | INHALATION_SPRAY | CUTANEOUS | Status: DC | PRN

## 2012-08-16 MED ORDER — HEPARIN SODIUM (PORCINE) 1000 UNIT/ML DIALYSIS: 1000.0000 [IU] | INTRAMUSCULAR | Status: DC | PRN

## 2012-08-16 MED ORDER — DICLOFENAC SODIUM 1 % TD GEL: 2.0000 g | Freq: Four times a day (QID) | TRANSDERMAL | Status: DC

## 2012-08-16 MED ORDER — ALTEPLASE 2 MG IJ SOLR: 2.0000 mg | Freq: Once | INTRAMUSCULAR | Status: DC | PRN

## 2012-08-16 MED ORDER — GABAPENTIN 100 MG PO CAPS: 100.0000 mg | ORAL_CAPSULE | Freq: Every day | ORAL | Status: DC

## 2012-08-16 MED ORDER — LIDOCAINE HCL (PF) 1 % IJ SOLN: 5.0000 mL | INTRAMUSCULAR | Status: DC | PRN

## 2012-08-16 MED ORDER — SODIUM CHLORIDE 0.9 % IV SOLN: 100.0000 mL | INTRAVENOUS | Status: DC | PRN

## 2012-08-16 MED ORDER — OXYCODONE-ACETAMINOPHEN 5-325 MG PO TABS: 1.0000 | ORAL_TABLET | Freq: Four times a day (QID) | ORAL | Status: DC | PRN

## 2012-08-16 MED ORDER — HEPARIN SODIUM (PORCINE) 1000 UNIT/ML DIALYSIS
20.0000 [IU]/kg | INTRAMUSCULAR | Status: DC | PRN
  Administered 2012-08-16: 1400 [IU] via INTRAVENOUS_CENTRAL

## 2012-08-16 MED ORDER — LIDOCAINE-PRILOCAINE 2.5-2.5 % EX CREA: 1.0000 "application " | TOPICAL_CREAM | CUTANEOUS | Status: DC | PRN

## 2012-08-16 MED ORDER — NEPRO/CARBSTEADY PO LIQD: 237.0000 mL | ORAL | Status: DC | PRN

## 2012-08-16 NOTE — Progress Notes (Signed)
Patient ID: Nicole Cox, female   DOB: 04-Nov-1931, 79 y.o.   MRN: [id number]  [name] Progress Note    Subjective:   Still c/o of bilateral sharp hand pain   Objective:   BP 123/69  Pulse 68  Temp(Src) 99 F (37.2 C) (Oral)  Resp 18  Ht 5\' 3"  (1.6 m)  Wt 70.8 kg (156 lb 1.4 oz)  BMI 27.66 kg/m2  SpO2 99%  Intake/Output: I/O last 3 completed shifts: In: 120 [P.O.:120] Out: -    Intake/Output this shift:  Total I/O In: 360 [P.O.:360] Out: 1891 [Other:1891] Weight change:   Physical Exam: Gen:WD elderly AAF in NAD CVS:no rub Resp:cta WUJ:WJXBJY Ext:no edema, LUE AVG +T/B  Labs: BMET  Recent Labs Lab 08/11/12 1508 08/12/12 0400 08/12/12 1827 08/13/12 1306 08/15/12 0530 08/16/12 0440  NA 135 131* 135 134* 133* 135  K 4.3 5.7* 4.4 4.5 4.3 4.3  CL 94* 91* 96 96 95* 95*  CO2 26 25 28 26 26 25   GLUCOSE 111* 247* 194* 164* 170* 120*  BUN 42* 53* 20 40* 47* 62*  CREATININE 8.61* 9.16* 4.23* 6.43* 7.28* 9.09*  ALBUMIN 3.1* 2.9* 2.9* 2.9* 2.9* 2.7*  CALCIUM 9.6 9.8 9.2 9.1 8.9 9.0  PHOS  --   --  3.5 3.8 3.8 4.3   CBC  Recent Labs Lab 08/11/12 1836 08/13/12 0435 08/15/12 0530  WBC 13.4* 14.4* 11.7*  NEUTROABS 10.8*  --   --   HGB 8.7* 8.1* 8.6*  HCT 26.9* 25.5* 26.4*  MCV 104.3* 107.1* 104.3*  PLT 280 224 219    @IMGRELPRIORS @ Medications:    . amLODipine  5 mg Oral Daily  . aspirin EC  81 mg Oral Daily  . calcium acetate  1,334 mg Oral Q breakfast  . cloNIDine  0.2 mg Oral BID  . darbepoetin (ARANESP) injection - DIALYSIS  40 mcg Intravenous Q Fri-HD  . diclofenac sodium  2 g Topical QID  . gabapentin  100 mg Oral QHS  . guaiFENesin  600 mg Oral BID  . insulin aspart  0-9 Units Subcutaneous TID WC  . insulin glargine  16 Units Subcutaneous QHS  . multivitamin  1 tablet Oral Daily  . pantoprazole  40 mg Oral Q1200  . simvastatin  20 mg Oral q1800  . sodium chloride  3 mL Intravenous Q12H     Assessment/ Plan:    1. Bilateral severe hand pain - seen by Dr. Merlyn Lot who rec NSAID.did "ok" with voltaren gel. Doubt steal syndrome as it affects both hands. Not responding to gabapentin.  Could increase to 200-300qhs. 2. Questionable - HCAP - per primary; has no cough or fever; repeat CXR 08/14/12 revealed improved infiltrates. OK to d/c antibiotics 3. ESRD - On TTS at [facility] only dialyzes for 3 hours, though has adequate kinetics with this time; had HD today to get back on schedule. 4. Hypertension/volume - BP 145/52 today. Weight post HD 70 kg (new EDW in my opinion) 5. Anemia - stable consistant with outpatient Hgb, though it is low; prior ESA show Epo 6000 in April, converted to Aranesp 25 and was due to increase to 40 5/29 - Aranesp 40/week. Will check Fe studies today 6. Metabolic bone disease - vitmain D not needed; P well controlled as outpt; on 1 phoslo ac tid 7. Nutrition - renal diet + vitamin 8. Dispo- per primary svc.  9. C1 fracture- wearing collar.  10.  Nicole Cox 08/16/2012, 3:03 PM

## 2012-08-16 NOTE — Progress Notes (Addendum)
Insurance has denied approval for admission to inpt rehab today. I have contacted pt's daughter by phone and she is aware. She is trying to arrange 24/7 supervision to d/c pt home today. I have alerted RN CM. [phone] Daughter has called back and has arranged 24/7 care for pt at home and wants home health to be arranged for d/c home today. I have contacted RN CM. [phone]

## 2012-08-16 NOTE — Progress Notes (Deleted)
Physician Discharge Summary  Nicole Cox YNW:[id number] DOB: September 13, 1931 DOA: 08/11/2012  PCP: Joana Reamer, MD  Admit date: 08/11/2012 Discharge date: 08/16/2012  Time spent: 35 minutes  Recommendations for Outpatient Follow-up:  1. Needs rpt MRI of spine ion 1-2 months due to ? Lesion at t4-5 level. 2. Might need hand surgeon input/EEG to determine if pain is RSD-Gabapentin Rx this admission for this 3. Continue regular meds/dialysis 4. Recommend rpt CXr 3-4 weeks to denote clearing  Discharge Diagnoses:  Active Problems:   HCAP (healthcare-associated pneumonia)   Hypoxia   ESRD (end stage renal disease)   Hypertension   Diabetes mellitus   C1 cervical fracture   Abnormal MRI, thoracic spine   COPD (chronic obstructive pulmonary disease)   CAD (coronary artery disease)   Fall   Discharge Condition: fair  Diet recommendation:  Renal diabetic  Filed Weights   08/13/12 1645 08/14/12 0500 08/16/12 [id number]  Weight: 70.1 kg (154 lb 8.7 oz) 70.761 kg (156 lb) 72.8 kg (160 lb 7.9 oz)    Brief narrative:  68 year African American female ESRD TTS [place], status post fall 08/11/2012 presented to [facility] and was discharged home-repeat a good secondary to severe bilateral hand pain but notable spinal cord abnormality at T4-T5. Neurology and neurosurgery were consulted and both did not think that this was anything acute and needed to be done although repeat MRI is warranted/EMG?carpal tunnel-she became hypoxic in the emergency room and was noted to have a pneumonia-repeat chest x-rays however subsequent to dialysis on sequential day showed resolution of the same and antibiotics were discontinued 08/14/12  Hand surgery was consulted because of her acute pain and the initial impression was arthritis versus calciphylaxis type pain which is nonsurgical-Gabapentin has bee started and triled.  SHe will be either d/c to CIR or Home with home healht 6/3 Please see  below  Assessment/Plan:  1. Mechanical fall with C1 nondisplaced anterior to the dens fracture indeterminate age + no mild ENT 4/5 level-patient does not require neurology input and I proceed discussed with Dr. Amada Jupiter who states she may need an MRI in the future [see #4 ] one to 2 months as well as potential EMG studies for what appeared to be cervical radicular pains  2. Intractable left index finger pain-appreciate hand surgeon Dr. Merlyn Lot who has seen patient 5/31-unlikely any surgical cause for pain potentially calciphylaxis versus arthritis- treated trialed Voltaren gel as per nephrology. Will trial Neurontin 100 qhs and see if any benefit.  Probably need out-patient Hand follow-up if no better.  Continue Opiates for now 3. End-stage renal disease-Tuesday Thursday Saturday candidate [place]-appreciate nephrology input-would continue dialysis per renal. discontinued Lasix 80 twice a day-continue calcium acetate 4. Abnormality at T4/T5 on MRI-repeat MRI in 2-3 months to rule out cancerous issue-unlikely this is causing her hand pain and per NS input on admission on review of images, no surgical intervention warranted 5. Anemia of renal disease +/-macrocytosis-continue Aranesp per nephrology- hemoglobin 8.6 on d/c to CIR 6. Hypertension continue amlodipine 5 mg daily, clonidine 0.2 twice a day-hypotensive at dialysis 5/31 this resolved  7. Diabetes mellitus A1c 6.4 continue Lantus 16 units each bedtime sliding scale coverage moderate-blood sugars between 130 and 150 8. ? Pneumonia-antibiotics were begun on admission 2/2 to this and #9-discontinued. Rpt CXR  6/1 showed Cardiomegaly/Opacity and pleural plaque-Recommend CXR 3 weeks to denote clearing-asymptomatic for PNA 9. Acute respiratory failure-probably related to noncompliance and missing dialysis-monitor. discontinued opiates IV. Her Percocet 10/325 every 6 when  necessary-sats normal  10. Hyperlipidemia continue Zocor 20 mg  daily    Consultants: Both spoke to the emergency room physician  Neurology  Neurosurgery  Hand-Dr. Merlyn Lot  Procedures:  MRI spine  Antibiotics:  Cefepime 5/29  Vancomycin 5/30  Levaquin 5/29-5/30   Discharge Exam: Filed Vitals:   08/16/12 0700 08/16/12 0730 08/16/12 0735 08/16/12 0744  BP: 128/59 87/44 101/53 119/49  Pulse: 77 63 70 78  Temp:      TempSrc:      Resp: 18 18 18 16   Height:      Weight:      SpO2:        Alert pleasant oriented, NAD  Still has hand pain  General: alert on Hd unit Cardiovascular: s1 s2 no m/r/g Respiratory: clear  Discharge Instructions  Discharge Orders   Future Orders Complete By Expires     Diet - low sodium heart healthy  As directed     Discharge instructions  As directed     Comments:      You will be going to in-patient rehab for further therapy for your hands Follow up with a hand specialist if no better You will need s repeat MRI of your spine a in 1-2 months Continue dialysis    Increase activity slowly  As directed         Medication List    STOP taking these medications       aspirin EC 81 MG tablet     furosemide 80 MG tablet  Commonly known as:  LASIX      TAKE these medications       acetaminophen 500 MG tablet  Commonly known as:  TYLENOL  Take 500 mg by mouth every 6 (six) hours as needed for pain.     amLODipine 5 MG tablet  Commonly known as:  NORVASC  Take 5 mg by mouth daily.     calcium acetate 667 MG capsule  Commonly known as:  PHOSLO  Take 1,334 mg by mouth daily.     cloNIDine 0.2 MG tablet  Commonly known as:  CATAPRES  Take 0.2 mg by mouth 2 (two) times daily.     diclofenac sodium 1 % Gel  Commonly known as:  VOLTAREN  Apply 2 g topically 4 (four) times daily.     gabapentin 100 MG capsule  Commonly known as:  NEURONTIN  Take 1 capsule (100 mg total) by mouth at bedtime.     insulin aspart 100 UNIT/ML injection  Commonly known as:  novoLOG  Inject 6 Units into the  skin 3 (three) times daily with meals.     insulin glargine 100 UNIT/ML injection  Commonly known as:  LANTUS  Inject 16 Units into the skin at bedtime.     oxyCODONE-acetaminophen 5-325 MG per tablet  Commonly known as:  PERCOCET/ROXICET  Take 1-2 tablets by mouth every 6 (six) hours as needed.     pravastatin 40 MG tablet  Commonly known as:  PRAVACHOL  Take 40 mg by mouth daily.     PRESCRIPTION MEDICATION  Inhale 2 puffs into the lungs daily as needed (inhailer).     traMADol 50 MG tablet  Commonly known as:  ULTRAM  Take 50 mg by mouth every 8 (eight) hours as needed for pain.     Vitamin D (Ergocalciferol) 50000 UNITS Caps  Commonly known as:  DRISDOL  Take 50,000 Units by mouth daily.       No Known Allergies  The results of significant diagnostics from this hospitalization (including imaging, microbiology, ancillary and laboratory) are listed below for reference.    Significant Diagnostic Studies: Dg Chest 2 View  08/14/2012   *RADIOLOGY REPORT*  Clinical Data: Status post dialysis.  Evaluate "infiltrates."  CHEST - 2 VIEW  Comparison: 08/12/2012  Findings: Patient rotated to the right.  Mild cardiomegaly. Probable small right pleural effusion, slightly increased. No pneumothorax.  Improved interstitial edema/venous congestion. Decreased right base air space disease.  Possible right-sided pleural plaque posteriorly versus artifact on the lateral view.  IMPRESSION: Cardiomegaly with improvement in interstitial edema/mild venous congestion.  Improved right base air space disease, suspicious for infection. Recommend radiographic follow-up until clearing.  Probable increasing small right pleural effusion; cannot exclude a pleural plaque posteriorly on the lateral view. Recommend attention on follow-up.   Original Report Authenticated By: Jeronimo Greaves, M.D.   Dg Wrist Complete Left  08/10/2012   *RADIOLOGY REPORT*  Clinical Data: Status post fall; bilateral wrist and hand  pain.  LEFT WRIST - COMPLETE 3+ VIEW  Comparison: None.  Findings: There is no evidence of fracture or dislocation.  The carpal rows are intact, and demonstrate normal alignment.  The joint spaces are preserved.  Mild scattered vascular calcifications are seen.  IMPRESSION:  1.  No evidence of fracture or dislocation. 2.  Mild scattered vascular calcifications seen.   Original Report Authenticated By: Tonia Ghent, M.D.   Dg Wrist Complete Right  08/10/2012   *RADIOLOGY REPORT*  Clinical Data: Status post fall; bilateral wrist and hand pain.  RIGHT WRIST - COMPLETE 3+ VIEW  Comparison: None.  Findings: There is no evidence of acute fracture or dislocation. There appears to be a chronic fracture involving the distal waist of the scaphoid, with some degree of associated bony remodelling.  The carpal rows are intact, and demonstrate normal alignment.  The joint spaces are preserved.  Mild negative ulnar variance is noted. There is mild diffuse osteopenia of visualized osseous structures.  There is minimal calcification of the triangular fibrocartilage. Scattered mild vascular calcifications are seen.  IMPRESSION:  1.  No evidence of acute fracture or dislocation. 2.  Apparent chronic fracture involving the distal waist of the scaphoid, with some degree of associated bony remodelling. 2.  Mild diffuse osteopenia of visualized osseous structures. 3.  Scattered mild vascular calcifications seen.   Original Report Authenticated By: Tonia Ghent, M.D.   Ct Head Wo Contrast  08/10/2012   *RADIOLOGY REPORT*  Clinical Data:  Status post fall; facial injury, with abrasion at the nose.  Concern for head or cervical spine injury.  CT HEAD WITHOUT CONTRAST AND CT CERVICAL SPINE WITHOUT CONTRAST  Technique:  Multidetector CT imaging of the head and cervical spine was performed following the standard protocol without intravenous contrast.  Multiplanar CT image reconstructions of the cervical spine were also generated.   Comparison: None  CT HEAD  Findings: There is no evidence of acute infarction, mass lesion, or intra- or extra-axial hemorrhage on CT.  Mild periventricular white matter change likely reflects small vessel ischemic microangiopathy.  The posterior fossa, including the cerebellum, brainstem and fourth ventricle, is within normal limits.  The third and lateral ventricles, and basal ganglia are unremarkable in appearance.  The cerebral hemispheres are symmetric in appearance, with normal gray- white differentiation.  No mass effect or midline shift is seen.  There is no evidence of fracture; visualized osseous structures are unremarkable in appearance.  The orbits are within normal limits. The paranasal sinuses  and mastoid air cells are well-aerated. Prominent soft tissue swelling is noted at the nose.  IMPRESSION:  1.  No evidence of traumatic intracranial injury or fracture. 2.  Prominent soft tissue swelling noted at the nose. 3.  Mild small vessel ischemic microangiopathy.  CT CERVICAL SPINE  Findings: There appears to be an essentially nondisplaced fracture through the anterior arch of C1, directly anterior to the dens. This is of indeterminate age.  There is no additional evidence of fracture.  Vertebral bodies demonstrate normal height and alignment.  Mild multilevel disc space narrowing is noted throughout the cervical and upper thoracic spine, with scattered associated anterior and posterior disc osteophyte complexes, and mild underlying facet disease. Prevertebral soft tissues are within normal limits.  The thyroid gland is unremarkable in appearance.  Mild emphysema is noted at the lung apices.  Calcification is noted at the carotid bifurcations bilaterally, slightly more prominent on the right.  IMPRESSION:  1.  Essentially nondisplaced fracture through the anterior arch of C1, directly anterior to the dens.  This is of indeterminate age. 2.  No additional evidence of fracture along the cervical spine. 3.   Mild diffuse degenerative change noted along the cervical spine. 4.  Mild emphysema at the lung apices. 5.  Calcification at the carotid bifurcations bilaterally, slightly more prominent on the right.  Carotid ultrasound could be considered for further evaluation, when and as deemed clinically appropriate.  These results were called by telephone on 08/10/2012 at 10:15 p.m. to Dr. Susy Frizzle, who verbally acknowledged these results.   Original Report Authenticated By: Tonia Ghent, M.D.   Ct Cervical Spine Wo Contrast  08/10/2012   *RADIOLOGY REPORT*  Clinical Data:  Status post fall; facial injury, with abrasion at the nose.  Concern for head or cervical spine injury.  CT HEAD WITHOUT CONTRAST AND CT CERVICAL SPINE WITHOUT CONTRAST  Technique:  Multidetector CT imaging of the head and cervical spine was performed following the standard protocol without intravenous contrast.  Multiplanar CT image reconstructions of the cervical spine were also generated.  Comparison: None  CT HEAD  Findings: There is no evidence of acute infarction, mass lesion, or intra- or extra-axial hemorrhage on CT.  Mild periventricular white matter change likely reflects small vessel ischemic microangiopathy.  The posterior fossa, including the cerebellum, brainstem and fourth ventricle, is within normal limits.  The third and lateral ventricles, and basal ganglia are unremarkable in appearance.  The cerebral hemispheres are symmetric in appearance, with normal gray- white differentiation.  No mass effect or midline shift is seen.  There is no evidence of fracture; visualized osseous structures are unremarkable in appearance.  The orbits are within normal limits. The paranasal sinuses and mastoid air cells are well-aerated. Prominent soft tissue swelling is noted at the nose.  IMPRESSION:  1.  No evidence of traumatic intracranial injury or fracture. 2.  Prominent soft tissue swelling noted at the nose. 3.  Mild small vessel ischemic  microangiopathy.  CT CERVICAL SPINE  Findings: There appears to be an essentially nondisplaced fracture through the anterior arch of C1, directly anterior to the dens. This is of indeterminate age.  There is no additional evidence of fracture.  Vertebral bodies demonstrate normal height and alignment.  Mild multilevel disc space narrowing is noted throughout the cervical and upper thoracic spine, with scattered associated anterior and posterior disc osteophyte complexes, and mild underlying facet disease. Prevertebral soft tissues are within normal limits.  The thyroid gland is unremarkable in appearance.  Mild emphysema is noted at the lung apices.  Calcification is noted at the carotid bifurcations bilaterally, slightly more prominent on the right.  IMPRESSION:  1.  Essentially nondisplaced fracture through the anterior arch of C1, directly anterior to the dens.  This is of indeterminate age. 2.  No additional evidence of fracture along the cervical spine. 3.  Mild diffuse degenerative change noted along the cervical spine. 4.  Mild emphysema at the lung apices. 5.  Calcification at the carotid bifurcations bilaterally, slightly more prominent on the right.  Carotid ultrasound could be considered for further evaluation, when and as deemed clinically appropriate.  These results were called by telephone on 08/10/2012 at 10:15 p.m. to Dr. Susy Frizzle, who verbally acknowledged these results.   Original Report Authenticated By: Tonia Ghent, M.D.   Mr Cervical Spine Wo Contrast  08/11/2012   *RADIOLOGY REPORT*  Clinical Data:  Fall.  Bilateral arm and hand pain.  Dialysis patient.  MRI CERVICAL AND THORACIC SPINE WITHOUT CONTRAST  Technique:  Multiplanar and multiecho pulse sequences of the cervical spine, to include the craniocervical junction and cervicothoracic junction, and the thoracic spine, were obtained without intravenous contrast.  Comparison:  CT cervical spine 08/10/2012  MRI CERVICAL SPINE   Findings:  Negative for fracture of the cervical spine.  No mass or edema.  Bone marrow is heterogeneous without focal mass lesion. This is most compatible  with chronic anemia and dialysis.  There is edema of the thoracic spinal cord therefore the thoracic spine was also scanned.  C2-3:  Mild disc degeneration.  C3-4:  Moderate disc degeneration and spondylosis.  There is moderate spinal stenosis and bilateral foraminal encroachment.  C4-5:  Disc degeneration and spondylosis with moderate to severe spinal stenosis.  There is foraminal stenosis bilaterally.  C5-6:  Disc degeneration and spondylosis with moderate spinal stenosis and foraminal encroachment bilaterally.  C6-7:  Disc degeneration and spondylosis with mild spinal stenosis  C7-T1:  Moderate disc degeneration without significant spinal stenosis.  There is left foraminal narrowing due to spurring.  IMPRESSION: Advanced cervical spondylosis with multilevel spinal stenosis, most severe at C3-4, C4-5, and C5-6.  Abnormal signal in the thoracic spinal cord, see separate thoracic MRI report.  MRI THORACIC SPINE  Findings: Abnormality within the spinal cord at the T4-5 level. There is a focal area of increased signal within the cord at the T4 level   measuring one vertebral height in  length.  Just below this is a focal area of narrowing of the cord suggesting atrophy.  No syrinx.  Contrast was not   administered due to dialysis.  No cord compression.  No vertebral mass or fracture is seen in this area.  Multilevel thoracic disc degeneration and spondylosis diffusely in the thoracic spine.  This is not causing significant spinal stenosis.  No acute disc protrusion.  Small pleural effusions bilaterally.  IMPRESSION: Abnormal spinal cord at the T4-5 level.  This could be due to chronic ischemia.  Acute infarct considered less likely. Underlying neoplastic process such as metastatic disease considered unlikely.  Primary cord tumor not considered likely.  Acute or  chronic transverse myelitis also a consideration.   Original Report Authenticated By: Janeece Riggers, M.D.   Mr Thoracic Spine Wo Contrast  08/11/2012   *RADIOLOGY REPORT*  Clinical Data:  Fall.  Bilateral arm and hand pain.  Dialysis patient.  MRI CERVICAL AND THORACIC SPINE WITHOUT CONTRAST  Technique:  Multiplanar and multiecho pulse sequences of the cervical spine, to include the  craniocervical junction and cervicothoracic junction, and the thoracic spine, were obtained without intravenous contrast.  Comparison:  CT cervical spine 08/10/2012  MRI CERVICAL SPINE  Findings:  Negative for fracture of the cervical spine.  No mass or edema.  Bone marrow is heterogeneous without focal mass lesion. This is most compatible  with chronic anemia and dialysis.  There is edema of the thoracic spinal cord therefore the thoracic spine was also scanned.  C2-3:  Mild disc degeneration.  C3-4:  Moderate disc degeneration and spondylosis.  There is moderate spinal stenosis and bilateral foraminal encroachment.  C4-5:  Disc degeneration and spondylosis with moderate to severe spinal stenosis.  There is foraminal stenosis bilaterally.  C5-6:  Disc degeneration and spondylosis with moderate spinal stenosis and foraminal encroachment bilaterally.  C6-7:  Disc degeneration and spondylosis with mild spinal stenosis  C7-T1:  Moderate disc degeneration without significant spinal stenosis.  There is left foraminal narrowing due to spurring.  IMPRESSION: Advanced cervical spondylosis with multilevel spinal stenosis, most severe at C3-4, C4-5, and C5-6.  Abnormal signal in the thoracic spinal cord, see separate thoracic MRI report.  MRI THORACIC SPINE  Findings: Abnormality within the spinal cord at the T4-5 level. There is a focal area of increased signal within the cord at the T4 level   measuring one vertebral height in  length.  Just below this is a focal area of narrowing of the cord suggesting atrophy.  No syrinx.  Contrast was not    administered due to dialysis.  No cord compression.  No vertebral mass or fracture is seen in this area.  Multilevel thoracic disc degeneration and spondylosis diffusely in the thoracic spine.  This is not causing significant spinal stenosis.  No acute disc protrusion.  Small pleural effusions bilaterally.  IMPRESSION: Abnormal spinal cord at the T4-5 level.  This could be due to chronic ischemia.  Acute infarct considered less likely. Underlying neoplastic process such as metastatic disease considered unlikely.  Primary cord tumor not considered likely.  Acute or chronic transverse myelitis also a consideration.   Original Report Authenticated By: Janeece Riggers, M.D.   Dg Chest Port 1 View  08/12/2012   *RADIOLOGY REPORT*  Clinical Data: Follow up chest radiograph after dialysis, shortness of breath, chest pain.  PORTABLE CHEST - 1 VIEW  Comparison: 08/11/2012  Findings: Stable patchy right lower lobe opacity.  While the appearance could reflect pneumonia, the apparent high density also raises the possibility of prior aspiration of barium contrast.  Pulmonary vascular congestion without frank interstitial edema.  No pleural effusion or pneumothorax.  Mild cardiomegaly.  IMPRESSION: Stable patchy right lower lobe opacity, possibly reflecting pneumonia, although the appearance could also reflect prior aspiration of barium.  Correlate with prior outside hospital imaging, if available.  Pulmonary vascular congestion without frank interstitial edema.   Original Report Authenticated By: Charline Bills, M.D.   Dg Chest Port 1 View  08/11/2012   *RADIOLOGY REPORT*  Clinical Data: Wheezing.  COPD.  Diabetes.  PORTABLE CHEST - 1 VIEW  Comparison: None.  Findings: Moderate cardiomegaly noted with bilateral indistinctness of pulmonary vasculature, interstitial accentuation, and patchy airspace opacity at the right lung base.  Bilateral small pleural effusions are present, right greater than left.  Thoracic spondylosis  noted.  Degenerative glenohumeral arthropathy observed bilaterally. .  IMPRESSION: 1.  Cardiomegaly with interstitial accentuation and patchy airspace opacity at the right lung base.  Appearance favors asymmetric edema although edema with superimposed right basilar pneumonia is not excluded.  2.  Small pleural effusions, right greater than left   Original Report Authenticated By: Gaylyn Rong, M.D.   Dg Hand Complete Left  08/10/2012   *RADIOLOGY REPORT*  Clinical Data: Status post fall; bilateral hand pain.  LEFT HAND - COMPLETE 3+ VIEW  Comparison: None.  Findings: There is no evidence of fracture or dislocation.  Mild joint space narrowing is suggested at the distal interphalangeal joints, with osteophyte formation, likely reflecting mild osteoarthritis.  There is mild diffuse osteopenia of visualized osseous structures.  Mild vascular calcifications are noted.  The carpal rows are intact, and demonstrate normal alignment.  IMPRESSION:  1.  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 No evidence of fracture or dislocation. 2.  Mild vascular calcifications seen.   Original Report Authenticated By: Tonia Ghent, M.D.   Dg Hand Complete Right  08/10/2012   *RADIOLOGY REPORT*  Clinical Data: Status post fall; bilateral wrist and hand pain.  RIGHT HAND - COMPLETE 3+ VIEW  Comparison: None.  Findings: There is no evidence of acute fracture or dislocation. An apparent chronic fracture involving the distal waist of the scaphoid is better characterized on concurrent wrist radiographs. Visualized joint spaces are grossly preserved.  Mild negative ulnar variance is noted.  Mild scattered vascular calcifications are seen.  IMPRESSION:  1.  No evidence of acute fracture or dislocation. 2.  Apparent chronic fracture involving the distal waist of the scaphoid is better characterized on concurrent wrist radiographs. 3.  Mild scattered vascular calcifications seen.   Original Report Authenticated  By: Tonia Ghent, M.D.    Microbiology: Recent Results (from the past 240 hour(s))  CULTURE, BLOOD (ROUTINE X 2)     Status: None   Collection Time    08/12/12 12:10 AM      Result Value Range Status   Specimen Description BLOOD LEFT ARM   Final   Special Requests     Final   Value: BOTTLES DRAWN AEROBIC AND ANAEROBIC 10CC,PT ON LEVAQUIN   Culture  Setup Time 08/12/2012 10:31   Final   Culture     Final   Value:        BLOOD CULTURE RECEIVED NO GROWTH TO DATE CULTURE WILL BE HELD FOR 5 DAYS BEFORE ISSUING A FINAL NEGATIVE REPORT   Report Status PENDING   Incomplete  CULTURE, BLOOD (ROUTINE X 2)     Status: None   Collection Time    08/12/12 12:15 AM      Result Value Range Status   Specimen Description BLOOD LEFT HAND   Final   Special Requests BOTTLES DRAWN AEROBIC ONLY 10CC,PT ON LEVAQUIN   Final   Culture  Setup Time 08/12/2012 10:30   Final   Culture     Final   Value:        BLOOD CULTURE RECEIVED NO GROWTH TO DATE CULTURE WILL BE HELD FOR 5 DAYS BEFORE ISSUING A FINAL NEGATIVE REPORT   Report Status PENDING   Incomplete     Labs: Basic Metabolic Panel:  Recent Labs Lab 08/12/12 0400 08/12/12 1827 08/13/12 1306 08/15/12 0530 08/16/12 0440  NA 131* 135 134* 133* 135  K 5.7* 4.4 4.5 4.3 4.3  CL 91* 96 96 95* 95*  CO2 25 28 26 26 25   GLUCOSE 247* 194* 164* 170* 120*  BUN 53* 20 40* 47* 62*  CREATININE 9.16* 4.23* 6.43* 7.28* 9.09*  CALCIUM 9.8 9.2 9.1 8.9 9.0  PHOS  --  3.5 3.8 3.8 4.3   Liver Function Tests:  Recent Labs Lab 08/11/12 1508 08/12/12 0400 08/12/12 1827 08/13/12 1306 08/15/12 0530 08/16/12 0440  AST 29 28  --   --   --   --   ALT 28 28  --   --   --   --   ALKPHOS 87 88  --   --   --   --   BILITOT 0.3 0.3  --   --   --   --   PROT 7.2 7.4  --   --   --   --   ALBUMIN 3.1* 2.9* 2.9* 2.9* 2.9* 2.7*   No results found for this basename: LIPASE, AMYLASE,  in the last 168 hours No results found for this basename: AMMONIA,  in the last 168  hours CBC:  Recent Labs Lab 08/11/12 1836 08/13/12 0435 08/15/12 0530  WBC 13.4* 14.4* 11.7*  NEUTROABS 10.8*  --   --   HGB 8.7* 8.1* 8.6*  HCT 26.9* 25.5* 26.4*  MCV 104.3* 107.1* 104.3*  PLT 280 224 219   Cardiac Enzymes: No results found for this basename: CKTOTAL, CKMB, CKMBINDEX, TROPONINI,  in the last 168 hours BNP: BNP (last 3 results) No results found for this basename: PROBNP,  in the last 8760 hours CBG:  Recent Labs Lab 08/14/12 2018 08/15/12 0734 08/15/12 1145 08/15/12 1624 08/15/12 2148  GLUCAP 204* 153* 147* 130* 140*       Signed:  Shown Dissinger, JAI-GURMUKH  [facility] 08/16/2012, 7:47 AM

## 2012-08-16 NOTE — Progress Notes (Signed)
I will begin insurance authorization with [facility] for a possible admission today if approved. [id number]

## 2012-08-16 NOTE — Progress Notes (Signed)
Pt and pt granddaughter provided with dc instructions and education. Pt verbalized understanding. IV removed with tip intact. Heart monitor cleaned and returned to front. Levonne Spiller, RN

## 2012-08-17 NOTE — Progress Notes (Signed)
Clinical Social Work Department BRIEF PSYCHOSOCIAL ASSESSMENT 08/17/2012  Patient:  Nicole Cox, Nicole Cox     Account Number:  [id number]     Admit date:  08/10/2012  Clinical Social Worker:  Kirke Shaggy  Date/Time:  08/16/2012 03:18 PM  Referred by:  RN  Date Referred:  08/16/2012 Referred for  SNF Placement   Other Referral:   Interview type:   Other interview type:    PSYCHOSOCIAL DATA Living Status:   Admitted from facility:   Level of care:   Primary support name:   Primary support relationship to patient:   Degree of support available:    CURRENT CONCERNS  Other Concerns:    SOCIAL WORK ASSESSMENT / PLAN CSW was referred this pt and the pt will be going home with [facility].   Assessment/plan status:   Other assessment/ plan:   Information/referral to community resources:    PATIENT'S/FAMILY'S RESPONSE TO PLAN OF CARE: Pt and dtr of the pt want her to go home with [facility].   Nicole Barge, LCSW-A Clinical Social Worker [phone]

## 2012-08-18 LAB — CULTURE, BLOOD (ROUTINE X 2): Culture: NO GROWTH

## 2012-08-22 NOTE — Discharge Summary (Signed)
Rhetta Mura, MD Physician Signed Internal Medicine Progress Notes Service date: 08/16/2012 7:47 AM  Physician Discharge Summary    Meriem Lemieux ZOX:[id number] DOB: 17-Sep-1931 DOA: 08/11/2012   PCP: Joana Reamer, MD   Admit date: 08/11/2012 Discharge date: 08/16/2012   Time spent: 35 minutes   Recommendations for Outpatient Follow-up:   Needs rpt MRI of spine ion 1-2 months due to ? Lesion at t4-5 level. Might need hand surgeon input/EEG to determine if pain is RSD-Gabapentin Rx this admission for this Continue regular meds/dialysis Recommend rpt CXr 3-4 weeks to denote clearing   Discharge Diagnoses:   Active Problems:   HCAP (healthcare-associated pneumonia)   Hypoxia   ESRD (end stage renal disease)   Hypertension   Diabetes mellitus   C1 cervical fracture   Abnormal MRI, thoracic spine   COPD (chronic obstructive pulmonary disease)   CAD (coronary artery disease)   Fall   Discharge Condition: fair   Diet recommendation:  Renal diabetic    Filed Weights     08/13/12 1645  08/14/12 0500  08/16/12 8119   Weight:  70.1 kg (154 lb 8.7 oz)  70.761 kg (156 lb)  72.8 kg (160 lb 7.9 oz)      Brief narrative:   62 year African American female ESRD TTS [place], status post fall 08/11/2012 presented to [facility] long hospital and was discharged home-repeat a good secondary to severe bilateral hand pain but notable spinal cord abnormality at T4-T5. Neurology and neurosurgery were consulted and both did not think that this was anything acute and needed to be done although repeat MRI is warranted/EMG?carpal tunnel-she became hypoxic in the emergency room and was noted to have a pneumonia-repeat chest x-rays however subsequent to dialysis on sequential day showed resolution of the same and antibiotics were discontinued 08/14/12   Hand surgery was consulted because of her acute pain and the initial impression was arthritis versus calciphylaxis type pain which is  nonsurgical-Gabapentin has bee started and triled.  SHe will be either d/c to CIR or Home with home healht 6/3 Please see below   Assessment/Plan:   Mechanical fall with C1 nondisplaced anterior to the dens fracture indeterminate age + no mild ENT 4/5 level-patient does not require neurology input and I proceed discussed with Dr. Amada Jupiter who states she may need an MRI in the future [see #4 ] one to 2 months as well as potential EMG studies for what appeared to be cervical radicular pains   Intractable left index finger pain-appreciate hand surgeon Dr. Merlyn Lot who has seen patient 5/31-unlikely any surgical cause for pain potentially calciphylaxis versus arthritis- treated trialed Voltaren gel as per nephrology. Will trial Neurontin 100 qhs and see if any benefit.  Probably need out-patient Hand follow-up if no better.  Continue Opiates for now End-stage renal disease-Tuesday Thursday Saturday candidate [place]-appreciate nephrology input-would continue dialysis per renal. discontinued Lasix 80 twice a day-continue calcium acetate Abnormality at T4/T5 on MRI-repeat MRI in 2-3 months to rule out cancerous issue-unlikely this is causing her hand pain and per NS input on admission on review of images, no surgical intervention warranted Anemia of renal disease +/-macrocytosis-continue Aranesp per nephrology- hemoglobin 8.6 on d/c to CIR Hypertension continue amlodipine 5 mg daily, clonidine 0.2 twice a day-hypotensive at dialysis 5/31 this resolved   Diabetes mellitus A1c 6.4 continue Lantus 16 units each bedtime sliding scale coverage moderate-blood sugars between 130 and 150 ? Pneumonia-antibiotics were begun on admission 2/2 to this and #9-discontinued. Rpt CXR  6/1 showed  Cardiomegaly/Opacity and pleural plaque-Recommend CXR 3 weeks to denote clearing-asymptomatic for PNA Acute respiratory failure-probably related to noncompliance and missing dialysis-monitor. discontinued opiates IV. Her  Percocet 10/325 every 6 when necessary-sats normal   Hyperlipidemia continue Zocor 20 mg daily       Consultants: Both spoke to the emergency room physician  Neurology   Neurosurgery    Hand-Dr. Merlyn Lot   Procedures:  MRI spine   Antibiotics:  Cefepime 5/29   Vancomycin 5/30   Levaquin 5/29-5/30     Discharge Exam: Filed Vitals:     08/16/12 0700  08/16/12 0730  08/16/12 0735  08/16/12 0744   BP:  128/59  87/44  101/53  119/49   Pulse:  77  63  70  78   Temp:           TempSrc:           Resp:  18  18  18  16    Height:           Weight:           SpO2:              Alert pleasant oriented, NAD   Still has hand pain   General: alert on Hd unit Cardiovascular: s1 s2 no m/r/g Respiratory: clear   Discharge Instructions    Discharge Orders     Future Orders  Complete By  Expires        Diet - low sodium heart healthy   As directed         Discharge instructions   As directed         Comments:         You will be going to in-patient rehab for further therapy for your hands Follow up with a hand specialist if no better You will need s repeat MRI of your spine a in 1-2 months Continue dialysis       Increase activity slowly   As directed              Medication List      STOP taking these medications           aspirin EC 81 MG tablet         furosemide 80 MG tablet   Commonly known as:  LASIX          TAKE these medications           acetaminophen 500 MG tablet   Commonly known as:  TYLENOL   Take 500 mg by mouth every 6 (six) hours as needed for pain.         amLODipine 5 MG tablet   Commonly known as:  NORVASC   Take 5 mg by mouth daily.         calcium acetate 667 MG capsule   Commonly known as:  PHOSLO   Take 1,334 mg by mouth daily.         cloNIDine 0.2 MG tablet   Commonly known as:  CATAPRES   Take 0.2 mg by mouth 2 (two) times daily.         diclofenac sodium 1 % Gel   Commonly known as:  VOLTAREN   Apply 2 g  topically 4 (four) times daily.         gabapentin 100 MG capsule   Commonly known as:  NEURONTIN   Take 1 capsule (100 mg total) by mouth at bedtime.  insulin aspart 100 UNIT/ML injection   Commonly known as:  novoLOG   Inject 6 Units into the skin 3 (three) times daily with meals.         insulin glargine 100 UNIT/ML injection   Commonly known as:  LANTUS   Inject 16 Units into the skin at bedtime.         oxyCODONE-acetaminophen 5-325 MG per tablet   Commonly known as:  PERCOCET/ROXICET   Take 1-2 tablets by mouth every 6 (six) hours as needed.         pravastatin 40 MG tablet   Commonly known as:  PRAVACHOL   Take 40 mg by mouth daily.         PRESCRIPTION MEDICATION   Inhale 2 puffs into the lungs daily as needed (inhailer).         traMADol 50 MG tablet   Commonly known as:  ULTRAM   Take 50 mg by mouth every 8 (eight) hours as needed for pain.         Vitamin D (Ergocalciferol) 50000 UNITS Caps   Commonly known as:  DRISDOL   Take 50,000 Units by mouth daily.           No Known Allergies    --------------------------------------------------------------------------------   The results of significant diagnostics from this hospitalization (including imaging, microbiology, ancillary and laboratory) are listed below for reference.       Significant Diagnostic Studies: Dg Chest 2 View   08/14/2012   *RADIOLOGY REPORT*  Clinical Data: Status post dialysis.  Evaluate "infiltrates."  CHEST - 2 VIEW  Comparison: 08/12/2012  Findings: Patient rotated to the right.  Mild cardiomegaly. Probable small right pleural effusion, slightly increased. No pneumothorax.  Improved interstitial edema/venous congestion. Decreased right base air space disease.  Possible right-sided pleural plaque posteriorly versus artifact on the lateral view.  IMPRESSION: Cardiomegaly with improvement in interstitial edema/mild venous congestion.  Improved right base air space disease,  suspicious for infection. Recommend radiographic follow-up until clearing.  Probable increasing small right pleural effusion; cannot exclude a pleural plaque posteriorly on the lateral view. Recommend attention on follow-up.   Original Report Authenticated By: Jeronimo Greaves, M.D.    Dg Wrist Complete Left   08/10/2012   *RADIOLOGY REPORT*  Clinical Data: Status post fall; bilateral wrist and hand pain.  LEFT WRIST - COMPLETE 3+ VIEW  Comparison: None.  Findings: There is no evidence of fracture or dislocation.  The carpal rows are intact, and demonstrate normal alignment.  The joint spaces are preserved.  Mild scattered vascular calcifications are seen.  IMPRESSION:  1.  No evidence of fracture or dislocation. 2.  Mild scattered vascular calcifications seen.   Original Report Authenticated By: Tonia Ghent, M.D.    Dg Wrist Complete Right   08/10/2012   *RADIOLOGY REPORT*  Clinical Data: Status post fall; bilateral wrist and hand pain.  RIGHT WRIST - COMPLETE 3+ VIEW  Comparison: None.  Findings: There is no evidence of acute fracture or dislocation. There appears to be a chronic fracture involving the distal waist of the scaphoid, with some degree of associated bony remodelling.  The carpal rows are intact, and demonstrate normal alignment.  The joint spaces are preserved.  Mild negative ulnar variance is noted. There is mild diffuse osteopenia of visualized osseous structures.  There is minimal calcification of the triangular fibrocartilage. Scattered mild vascular calcifications are seen.  IMPRESSION:  1.  No evidence of acute fracture or dislocation. 2.  Apparent chronic fracture  involving the distal waist of the scaphoid, with some degree of associated bony remodelling. 2.  Mild diffuse osteopenia of visualized osseous structures. 3.  Scattered mild vascular calcifications seen.   Original Report Authenticated By: Tonia Ghent, M.D.    Ct Head Wo Contrast   08/10/2012   *RADIOLOGY REPORT*   Clinical Data:  Status post fall; facial injury, with abrasion at the nose.  Concern for head or cervical spine injury.  CT HEAD WITHOUT CONTRAST AND CT CERVICAL SPINE WITHOUT CONTRAST  Technique:  Multidetector CT imaging of the head and cervical spine was performed following the standard protocol without intravenous contrast.  Multiplanar CT image reconstructions of the cervical spine were also generated.  Comparison: None  CT HEAD  Findings: There is no evidence of acute infarction, mass lesion, or intra- or extra-axial hemorrhage on CT.  Mild periventricular white matter change likely reflects small vessel ischemic microangiopathy.  The posterior fossa, including the cerebellum, brainstem and fourth ventricle, is within normal limits.  The third and lateral ventricles, and basal ganglia are unremarkable in appearance.  The cerebral hemispheres are symmetric in appearance, with normal gray- white differentiation.  No mass effect or midline shift is seen.  There is no evidence of fracture; visualized osseous structures are unremarkable in appearance.  The orbits are within normal limits. The paranasal sinuses and mastoid air cells are well-aerated. Prominent soft tissue swelling is noted at the nose.  IMPRESSION:  1.  No evidence of traumatic intracranial injury or fracture. 2.  Prominent soft tissue swelling noted at the nose. 3.  Mild small vessel ischemic microangiopathy.  CT CERVICAL SPINE  Findings: There appears to be an essentially nondisplaced fracture through the anterior arch of C1, directly anterior to the dens. This is of indeterminate age.  There is no additional evidence of fracture.  Vertebral bodies demonstrate normal height and alignment.  Mild multilevel disc space narrowing is noted throughout the cervical and upper thoracic spine, with scattered associated anterior and posterior disc osteophyte complexes, and mild underlying facet disease. Prevertebral soft tissues are within normal limits.   The thyroid gland is unremarkable in appearance.  Mild emphysema is noted at the lung apices.  Calcification is noted at the carotid bifurcations bilaterally, slightly more prominent on the right.  IMPRESSION:  1.  Essentially nondisplaced fracture through the anterior arch of C1, directly anterior to the dens.  This is of indeterminate age. 2.  No additional evidence of fracture along the cervical spine. 3.  Mild diffuse degenerative change noted along the cervical spine. 4.  Mild emphysema at the lung apices. 5.  Calcification at the carotid bifurcations bilaterally, slightly more prominent on the right.  Carotid ultrasound could be considered for further evaluation, when and as deemed clinically appropriate.  These results were called by telephone on 08/10/2012 at 10:15 p.m. to Dr. Susy Frizzle, who verbally acknowledged these results.   Original Report Authenticated By: Tonia Ghent, M.D.    Ct Cervical Spine Wo Contrast   08/10/2012   *RADIOLOGY REPORT*  Clinical Data:  Status post fall; facial injury, with abrasion at the nose.  Concern for head or cervical spine injury.  CT HEAD WITHOUT CONTRAST AND CT CERVICAL SPINE WITHOUT CONTRAST  Technique:  Multidetector CT imaging of the head and cervical spine was performed following the standard protocol without intravenous contrast.  Multiplanar CT image reconstructions of the cervical spine were also generated.  Comparison: None  CT HEAD  Findings: There is no evidence of acute infarction,  mass lesion, or intra- or extra-axial hemorrhage on CT.  Mild periventricular white matter change likely reflects small vessel ischemic microangiopathy.  The posterior fossa, including the cerebellum, brainstem and fourth ventricle, is within normal limits.  The third and lateral ventricles, and basal ganglia are unremarkable in appearance.  The cerebral hemispheres are symmetric in appearance, with normal gray- white differentiation.  No mass effect or midline shift is  seen.  There is no evidence of fracture; visualized osseous structures are unremarkable in appearance.  The orbits are within normal limits. The paranasal sinuses and mastoid air cells are well-aerated. Prominent soft tissue swelling is noted at the nose.  IMPRESSION:  1.  No evidence of traumatic intracranial injury or fracture. 2.  Prominent soft tissue swelling noted at the nose. 3.  Mild small vessel ischemic microangiopathy.  CT CERVICAL SPINE  Findings: There appears to be an essentially nondisplaced fracture through the anterior arch of C1, directly anterior to the dens. This is of indeterminate age.  There is no additional evidence of fracture.  Vertebral bodies demonstrate normal height and alignment.  Mild multilevel disc space narrowing is noted throughout the cervical and upper thoracic spine, with scattered associated anterior and posterior disc osteophyte complexes, and mild underlying facet disease. Prevertebral soft tissues are within normal limits.  The thyroid gland is unremarkable in appearance.  Mild emphysema is noted at the lung apices.  Calcification is noted at the carotid bifurcations bilaterally, slightly more prominent on the right.  IMPRESSION:  1.  Essentially nondisplaced fracture through the anterior arch of C1, directly anterior to the dens.  This is of indeterminate age. 2.  No additional evidence of fracture along the cervical spine. 3.  Mild diffuse degenerative change noted along the cervical spine. 4.  Mild emphysema at the lung apices. 5.  Calcification at the carotid bifurcations bilaterally, slightly more prominent on the right.  Carotid ultrasound could be considered for further evaluation, when and as deemed clinically appropriate.  These results were called by telephone on 08/10/2012 at 10:15 p.m. to Dr. Susy Frizzle, who verbally acknowledged these results.   Original Report Authenticated By: Tonia Ghent, M.D.    Mr Cervical Spine Wo Contrast   08/11/2012    *RADIOLOGY REPORT*  Clinical Data:  Fall.  Bilateral arm and hand pain.  Dialysis patient.  MRI CERVICAL AND THORACIC SPINE WITHOUT CONTRAST  Technique:  Multiplanar and multiecho pulse sequences of the cervical spine, to include the craniocervical junction and cervicothoracic junction, and the thoracic spine, were obtained without intravenous contrast.  Comparison:  CT cervical spine 08/10/2012  MRI CERVICAL SPINE  Findings:  Negative for fracture of the cervical spine.  No mass or edema.  Bone marrow is heterogeneous without focal mass lesion. This is most compatible  with chronic anemia and dialysis.  There is edema of the thoracic spinal cord therefore the thoracic spine was also scanned.  C2-3:  Mild disc degeneration.  C3-4:  Moderate disc degeneration and spondylosis.  There is moderate spinal stenosis and bilateral foraminal encroachment.  C4-5:  Disc degeneration and spondylosis with moderate to severe spinal stenosis.  There is foraminal stenosis bilaterally.  C5-6:  Disc degeneration and spondylosis with moderate spinal stenosis and foraminal encroachment bilaterally.  C6-7:  Disc degeneration and spondylosis with mild spinal stenosis  C7-T1:  Moderate disc degeneration without significant spinal stenosis.  There is left foraminal narrowing due to spurring.  IMPRESSION: Advanced cervical spondylosis with multilevel spinal stenosis, most severe at C3-4, C4-5, and  C5-6.  Abnormal signal in the thoracic spinal cord, see separate thoracic MRI report.  MRI THORACIC SPINE  Findings: Abnormality within the spinal cord at the T4-5 level. There is a focal area of increased signal within the cord at the T4 level   measuring one vertebral height in  length.  Just below this is a focal area of narrowing of the cord suggesting atrophy.  No syrinx.  Contrast was not   administered due to dialysis.  No cord compression.  No vertebral mass or fracture is seen in this area.  Multilevel thoracic disc degeneration and  spondylosis diffusely in the thoracic spine.  This is not causing significant spinal stenosis.  No acute disc protrusion.  Small pleural effusions bilaterally.  IMPRESSION: Abnormal spinal cord at the T4-5 level.  This could be due to chronic ischemia.  Acute infarct considered less likely. Underlying neoplastic process such as metastatic disease considered unlikely.  Primary cord tumor not considered likely.  Acute or chronic transverse myelitis also a consideration.   Original Report Authenticated By: Janeece Riggers, M.D.    Mr Thoracic Spine Wo Contrast   08/11/2012   *RADIOLOGY REPORT*  Clinical Data:  Fall.  Bilateral arm and hand pain.  Dialysis patient.  MRI CERVICAL AND THORACIC SPINE WITHOUT CONTRAST  Technique:  Multiplanar and multiecho pulse sequences of the cervical spine, to include the craniocervical junction and cervicothoracic junction, and the thoracic spine, were obtained without intravenous contrast.  Comparison:  CT cervical spine 08/10/2012  MRI CERVICAL SPINE  Findings:  Negative for fracture of the cervical spine.  No mass or edema.  Bone marrow is heterogeneous without focal mass lesion. This is most compatible  with chronic anemia and dialysis.  There is edema of the thoracic spinal cord therefore the thoracic spine was also scanned.  C2-3:  Mild disc degeneration.  C3-4:  Moderate disc degeneration and spondylosis.  There is moderate spinal stenosis and bilateral foraminal encroachment.  C4-5:  Disc degeneration and spondylosis with moderate to severe spinal stenosis.  There is foraminal stenosis bilaterally.  C5-6:  Disc degeneration and spondylosis with moderate spinal stenosis and foraminal encroachment bilaterally.  C6-7:  Disc degeneration and spondylosis with mild spinal stenosis  C7-T1:  Moderate disc degeneration without significant spinal stenosis.  There is left foraminal narrowing due to spurring.  IMPRESSION: Advanced cervical spondylosis with multilevel spinal stenosis, most  severe at C3-4, C4-5, and C5-6.  Abnormal signal in the thoracic spinal cord, see separate thoracic MRI report.  MRI THORACIC SPINE  Findings: Abnormality within the spinal cord at the T4-5 level. There is a focal area of increased signal within the cord at the T4 level   measuring one vertebral height in  length.  Just below this is a focal area of narrowing of the cord suggesting atrophy.  No syrinx.  Contrast was not   administered due to dialysis.  No cord compression.  No vertebral mass or fracture is seen in this area.  Multilevel thoracic disc degeneration and spondylosis diffusely in the thoracic spine.  This is not causing significant spinal stenosis.  No acute disc protrusion.  Small pleural effusions bilaterally.  IMPRESSION: Abnormal spinal cord at the T4-5 level.  This could be due to chronic ischemia.  Acute infarct considered less likely. Underlying neoplastic process such as metastatic disease considered unlikely.  Primary cord tumor not considered likely.  Acute or chronic transverse myelitis also a consideration.   Original Report Authenticated By: Janeece Riggers, M.D.  Dg Chest Port 1 View   08/12/2012   *RADIOLOGY REPORT*  Clinical Data: Follow up chest radiograph after dialysis, shortness of breath, chest pain.  PORTABLE CHEST - 1 VIEW  Comparison: 08/11/2012  Findings: Stable patchy right lower lobe opacity.  While the appearance could reflect pneumonia, the apparent high density also raises the possibility of prior aspiration of barium contrast.  Pulmonary vascular congestion without frank interstitial edema.  No pleural effusion or pneumothorax.  Mild cardiomegaly.  IMPRESSION: Stable patchy right lower lobe opacity, possibly reflecting pneumonia, although the appearance could also reflect prior aspiration of barium.  Correlate with prior outside hospital imaging, if available.  Pulmonary vascular congestion without frank interstitial edema.   Original Report Authenticated By: Charline Bills, M.D.    Dg Chest Port 1 View   08/11/2012   *RADIOLOGY REPORT*  Clinical Data: Wheezing.  COPD.  Diabetes.  PORTABLE CHEST - 1 VIEW  Comparison: None.  Findings: Moderate cardiomegaly noted with bilateral indistinctness of pulmonary vasculature, interstitial accentuation, and patchy airspace opacity at the right lung base.  Bilateral small pleural effusions are present, right greater than left.  Thoracic spondylosis noted.  Degenerative glenohumeral arthropathy observed bilaterally. .  IMPRESSION: 1.  Cardiomegaly with interstitial accentuation and patchy airspace opacity at the right lung base.  Appearance favors asymmetric edema although edema with superimposed right basilar pneumonia is not excluded.  2.  Small pleural effusions, right greater than left   Original Report Authenticated By: Gaylyn Rong, M.D.    Dg Hand Complete Left   08/10/2012   *RADIOLOGY REPORT*  Clinical Data: Status post fall; bilateral hand pain.  LEFT HAND - COMPLETE 3+ VIEW  Comparison: None.  Findings: There is no evidence of fracture or dislocation.  Mild joint space narrowing is suggested at the distal interphalangeal joints, with osteophyte formation, likely reflecting mild osteoarthritis.  There is mild diffuse osteopenia of visualized osseous structures.  Mild vascular calcifications are noted.  The carpal rows are intact, and demonstrate normal alignment.  IMPRESSION:  1.  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 No evidence of fracture or dislocation. 2.  Mild vascular calcifications seen.   Original Report Authenticated By: Tonia Ghent, M.D.    Dg Hand Complete Right   08/10/2012   *RADIOLOGY REPORT*  Clinical Data: Status post fall; bilateral wrist and hand pain.  RIGHT HAND - COMPLETE 3+ VIEW  Comparison: None.  Findings: There is no evidence of acute fracture or dislocation. An apparent chronic fracture involving the distal waist of the scaphoid is better characterized on  concurrent wrist radiographs. Visualized joint spaces are grossly preserved.  Mild negative ulnar variance is noted.  Mild scattered vascular calcifications are seen.  IMPRESSION:  1.  No evidence of acute fracture or dislocation. 2.  Apparent chronic fracture involving the distal waist of the scaphoid is better characterized on concurrent wrist radiographs. 3.  Mild scattered vascular calcifications seen.   Original Report Authenticated By: Tonia Ghent, M.D.      Microbiology: Recent Results (from the past 240 hour(s))   CULTURE, BLOOD (ROUTINE X 2)     Status: None     Collection Time      08/12/12 12:10 AM       Result  Value  Range  Status     Specimen Description  BLOOD LEFT ARM     Final     Special Requests        Final     Value:  BOTTLES DRAWN  AEROBIC AND ANAEROBIC 10CC,PT ON LEVAQUIN     Culture  Setup Time  08/12/2012 10:31     Final     Culture        Final     Value:         BLOOD CULTURE RECEIVED NO GROWTH TO DATE CULTURE WILL BE HELD FOR 5 DAYS BEFORE ISSUING A FINAL NEGATIVE REPORT     Report Status  PENDING     Incomplete   CULTURE, BLOOD (ROUTINE X 2)     Status: None     Collection Time      08/12/12 12:15 AM       Result  Value  Range  Status     Specimen Description  BLOOD LEFT HAND     Final     Special Requests  BOTTLES DRAWN AEROBIC ONLY 10CC,PT ON LEVAQUIN     Final     Culture  Setup Time  08/12/2012 10:30     Final     Culture        Final     Value:         BLOOD CULTURE RECEIVED NO GROWTH TO DATE CULTURE WILL BE HELD FOR 5 DAYS BEFORE ISSUING A FINAL NEGATIVE REPORT     Report Status  PENDING     Incomplete      Labs: Basic Metabolic Panel: Recent Labs Lab  08/12/12 0400  08/12/12 1827  08/13/12 1306  08/15/12 0530  08/16/12 0440   NA  131*  135  134*  133*  135   K  5.7*  4.4  4.5  4.3  4.3   CL  91*  96  96  95*  95*   CO2  25  28  26  26  25    GLUCOSE  247*  194*  164*  170*  120*   BUN  53*  20  40*  47*  62*   CREATININE  9.16*  4.23*   6.43*  7.28*  9.09*   CALCIUM  9.8  9.2  9.1  8.9  9.0   PHOS   --   3.5  3.8  3.8  4.3    Liver Function Tests: Recent Labs Lab  08/11/12 1508  08/12/12 0400  08/12/12 1827  08/13/12 1306  08/15/12 0530  08/16/12 0440   AST  29  28   --    --    --    --    ALT  28  28   --    --    --    --    ALKPHOS  87  88   --    --    --    --    BILITOT  0.3  0.3   --    --    --    --    PROT  7.2  7.4   --    --    --    --    ALBUMIN  3.1*  2.9*  2.9*  2.9*  2.9*  2.7*    No results found for this basename: LIPASE, AMYLASE,  in the last 168 hours No results found for this basename: AMMONIA,  in the last 168 hours CBC: Recent Labs Lab  08/11/12 1836  08/13/12 0435  08/15/12 0530   WBC  13.4*  14.4*  11.7*   NEUTROABS  10.8*   --    --  HGB  8.7*  8.1*  8.6*   HCT  26.9*  25.5*  26.4*   MCV  104.3*  107.1*  104.3*   PLT  280  224  219    Cardiac Enzymes: No results found for this basename: CKTOTAL, CKMB, CKMBINDEX, TROPONINI,  in the last 168 hours BNP: BNP (last 3 results) No results found for this basename: PROBNP,  in the last 8760 hours CBG: Recent Labs Lab  08/14/12 2018  08/15/12 0734  08/15/12 1145  08/15/12 1624  08/15/12 2148   GLUCAP  204*  153*  147*  130*  140*            Signed:   Danyal Adorno, JAI-GURMUKH        [facility] 08/16/2012, 7:47 AM

## 2012-10-20 ENCOUNTER — Other Ambulatory Visit: Payer: Self-pay | Admitting: Neurosurgery

## 2012-10-31 ENCOUNTER — Other Ambulatory Visit: Payer: Medicare HMO

## 2015-08-13 ENCOUNTER — Emergency Department (HOSPITAL_COMMUNITY): Payer: Medicare HMO

## 2015-08-13 ENCOUNTER — Encounter (HOSPITAL_COMMUNITY): Payer: Self-pay | Admitting: *Deleted

## 2015-08-13 ENCOUNTER — Inpatient Hospital Stay (HOSPITAL_COMMUNITY)
Admission: EM | Admit: 2015-08-13 | Discharge: 2015-08-17 | DRG: 190 | Disposition: A | Discharge status: Home Health | Payer: Medicare HMO | Attending: Internal Medicine | Admitting: Internal Medicine

## 2015-08-13 DIAGNOSIS — D649 Anemia, unspecified: Secondary | ICD-10-CM | POA: Diagnosis not present

## 2015-08-13 DIAGNOSIS — Z515 Encounter for palliative care: Secondary | ICD-10-CM | POA: Diagnosis not present

## 2015-08-13 DIAGNOSIS — G9340 Encephalopathy, unspecified: Secondary | ICD-10-CM | POA: Diagnosis not present

## 2015-08-13 DIAGNOSIS — Z823 Family history of stroke: Secondary | ICD-10-CM

## 2015-08-13 DIAGNOSIS — Z8673 Personal history of transient ischemic attack (TIA), and cerebral infarction without residual deficits: Secondary | ICD-10-CM | POA: Diagnosis not present

## 2015-08-13 DIAGNOSIS — J189 Pneumonia, unspecified organism: Secondary | ICD-10-CM

## 2015-08-13 DIAGNOSIS — J9600 Acute respiratory failure, unspecified whether with hypoxia or hypercapnia: Secondary | ICD-10-CM | POA: Diagnosis present

## 2015-08-13 DIAGNOSIS — I120 Hypertensive chronic kidney disease with stage 5 chronic kidney disease or end stage renal disease: Secondary | ICD-10-CM | POA: Diagnosis present

## 2015-08-13 DIAGNOSIS — Z841 Family history of disorders of kidney and ureter: Secondary | ICD-10-CM | POA: Diagnosis not present

## 2015-08-13 DIAGNOSIS — E1122 Type 2 diabetes mellitus with diabetic chronic kidney disease: Secondary | ICD-10-CM | POA: Diagnosis present

## 2015-08-13 DIAGNOSIS — I160 Hypertensive urgency: Secondary | ICD-10-CM | POA: Diagnosis present

## 2015-08-13 DIAGNOSIS — Z794 Long term (current) use of insulin: Secondary | ICD-10-CM | POA: Diagnosis not present

## 2015-08-13 DIAGNOSIS — Y95 Nosocomial condition: Secondary | ICD-10-CM | POA: Diagnosis present

## 2015-08-13 DIAGNOSIS — Z9981 Dependence on supplemental oxygen: Secondary | ICD-10-CM | POA: Diagnosis not present

## 2015-08-13 DIAGNOSIS — N186 End stage renal disease: Secondary | ICD-10-CM | POA: Diagnosis present

## 2015-08-13 DIAGNOSIS — Z09 Encounter for follow-up examination after completed treatment for conditions other than malignant neoplasm: Secondary | ICD-10-CM

## 2015-08-13 DIAGNOSIS — J9601 Acute respiratory failure with hypoxia: Secondary | ICD-10-CM | POA: Diagnosis present

## 2015-08-13 DIAGNOSIS — Z992 Dependence on renal dialysis: Secondary | ICD-10-CM

## 2015-08-13 DIAGNOSIS — I2510 Atherosclerotic heart disease of native coronary artery without angina pectoris: Secondary | ICD-10-CM | POA: Diagnosis present

## 2015-08-13 DIAGNOSIS — Z87891 Personal history of nicotine dependence: Secondary | ICD-10-CM

## 2015-08-13 DIAGNOSIS — Z8249 Family history of ischemic heart disease and other diseases of the circulatory system: Secondary | ICD-10-CM | POA: Diagnosis not present

## 2015-08-13 DIAGNOSIS — R0602 Shortness of breath: Secondary | ICD-10-CM | POA: Diagnosis present

## 2015-08-13 DIAGNOSIS — Z79899 Other long term (current) drug therapy: Secondary | ICD-10-CM

## 2015-08-13 DIAGNOSIS — R609 Edema, unspecified: Secondary | ICD-10-CM

## 2015-08-13 DIAGNOSIS — D631 Anemia in chronic kidney disease: Secondary | ICD-10-CM | POA: Diagnosis present

## 2015-08-13 DIAGNOSIS — J440 Chronic obstructive pulmonary disease with acute lower respiratory infection: Secondary | ICD-10-CM | POA: Diagnosis present

## 2015-08-13 DIAGNOSIS — M79602 Pain in left arm: Secondary | ICD-10-CM

## 2015-08-13 DIAGNOSIS — E875 Hyperkalemia: Secondary | ICD-10-CM | POA: Diagnosis present

## 2015-08-13 LAB — CBC WITH DIFFERENTIAL/PLATELET
BASOS ABS: 0 10*3/uL (ref 0.0–0.1)
BASOS ABS: 0 10*3/uL (ref 0.0–0.1)
BASOS PCT: 0 %
Basophils Relative: 0 %
EOS ABS: 0.6 10*3/uL (ref 0.0–0.7)
EOS ABS: 0.3 10*3/uL (ref 0.0–0.7)
EOS PCT: 3 %
Eosinophils Relative: 6 %
HCT: 31.5 % — ABNORMAL LOW (ref 36.0–46.0)
HCT: 30.5 % — ABNORMAL LOW (ref 36.0–46.0)
Hemoglobin: 9.6 g/dL — ABNORMAL LOW (ref 12.0–15.0)
Hemoglobin: 9.3 g/dL — ABNORMAL LOW (ref 12.0–15.0)
LYMPHS ABS: 2.5 10*3/uL (ref 0.7–4.0)
LYMPHS PCT: 15 %
Lymphocytes Relative: 25 %
Lymphs Abs: 1.7 10*3/uL (ref 0.7–4.0)
MCH: 30.8 pg (ref 26.0–34.0)
MCH: 30.5 pg (ref 26.0–34.0)
MCHC: 30.5 g/dL (ref 30.0–36.0)
MCHC: 30.5 g/dL (ref 30.0–36.0)
MCV: 101 fL — ABNORMAL HIGH (ref 78.0–100.0)
MCV: 100 fL (ref 78.0–100.0)
Monocytes Absolute: 0.9 10*3/uL (ref 0.1–1.0)
Monocytes Absolute: 0.7 10*3/uL (ref 0.1–1.0)
Monocytes Relative: 9 %
Monocytes Relative: 6 %
NEUTROS PCT: 60 %
Neutro Abs: 6.1 10*3/uL (ref 1.7–7.7)
Neutro Abs: 8.2 10*3/uL — ABNORMAL HIGH (ref 1.7–7.7)
Neutrophils Relative %: 76 %
PLATELETS: 279 10*3/uL (ref 150–400)
PLATELETS: 252 10*3/uL (ref 150–400)
RBC: 3.12 MIL/uL — AB (ref 3.87–5.11)
RBC: 3.05 MIL/uL — AB (ref 3.87–5.11)
RDW: 16 % — ABNORMAL HIGH (ref 11.5–15.5)
RDW: 16 % — ABNORMAL HIGH (ref 11.5–15.5)
WBC: 10 10*3/uL (ref 4.0–10.5)
WBC: 10.9 10*3/uL — AB (ref 4.0–10.5)

## 2015-08-13 LAB — COMPREHENSIVE METABOLIC PANEL
ALK PHOS: 120 U/L (ref 38–126)
ALT: 26 U/L (ref 14–54)
ALT: 24 U/L (ref 14–54)
ANION GAP: 13 (ref 5–15)
AST: 29 U/L (ref 15–41)
AST: 24 U/L (ref 15–41)
Albumin: 3.4 g/dL — ABNORMAL LOW (ref 3.5–5.0)
Albumin: 3.2 g/dL — ABNORMAL LOW (ref 3.5–5.0)
Alkaline Phosphatase: 148 U/L — ABNORMAL HIGH (ref 38–126)
Anion gap: 14 (ref 5–15)
BILIRUBIN TOTAL: 0.7 mg/dL (ref 0.3–1.2)
BUN: 63 mg/dL — AB (ref 6–20)
BUN: 67 mg/dL — AB (ref 6–20)
CALCIUM: 8.5 mg/dL — AB (ref 8.9–10.3)
CHLORIDE: 102 mmol/L (ref 101–111)
CO2: 23 mmol/L (ref 22–32)
CO2: 21 mmol/L — ABNORMAL LOW (ref 22–32)
CREATININE: 9.89 mg/dL — AB (ref 0.44–1.00)
Calcium: 8.9 mg/dL (ref 8.9–10.3)
Chloride: 106 mmol/L (ref 101–111)
Creatinine, Ser: 10.52 mg/dL — ABNORMAL HIGH (ref 0.44–1.00)
GFR calc Af Amer: 3 mL/min — ABNORMAL LOW (ref >60)
GFR calc non Af Amer: 3 mL/min — ABNORMAL LOW (ref >60)
GFR, EST AFRICAN AMERICAN: 4 mL/min — AB (ref >60)
GFR, EST NON AFRICAN AMERICAN: 3 mL/min — AB (ref >60)
Glucose, Bld: 113 mg/dL — ABNORMAL HIGH (ref 65–99)
Glucose, Bld: 109 mg/dL — ABNORMAL HIGH (ref 65–99)
POTASSIUM: 5.2 mmol/L — AB (ref 3.5–5.1)
POTASSIUM: 6.9 mmol/L — AB (ref 3.5–5.1)
SODIUM: 139 mmol/L (ref 135–145)
Sodium: 140 mmol/L (ref 135–145)
TOTAL PROTEIN: 7.1 g/dL (ref 6.5–8.1)
Total Bilirubin: 0.7 mg/dL (ref 0.3–1.2)
Total Protein: 7.3 g/dL (ref 6.5–8.1)

## 2015-08-13 LAB — I-STAT ARTERIAL BLOOD GAS, ED
BICARBONATE: 22.1 meq/L (ref 20.0–24.0)
O2 SAT: 96 %
PH ART: 7.51 — AB (ref 7.350–7.450)
PO2 ART: 71 mmHg — AB (ref 80.0–100.0)
Patient temperature: 98.6
TCO2: 23 mmol/L (ref 0–100)
pCO2 arterial: 27.7 mmHg — ABNORMAL LOW (ref 35.0–45.0)

## 2015-08-13 LAB — GLUCOSE, CAPILLARY
GLUCOSE-CAPILLARY: 94 mg/dL (ref 65–99)
GLUCOSE-CAPILLARY: 89 mg/dL (ref 65–99)
Glucose-Capillary: 104 mg/dL — ABNORMAL HIGH (ref 65–99)

## 2015-08-13 LAB — TROPONIN I
Troponin I: 0.05 ng/mL — ABNORMAL HIGH (ref <0.031)
Troponin I: 0.06 ng/mL — ABNORMAL HIGH (ref <0.031)

## 2015-08-13 LAB — MRSA PCR SCREENING: MRSA by PCR: NEGATIVE

## 2015-08-13 LAB — HEPATITIS B SURFACE ANTIGEN: HEP B S AG: NEGATIVE

## 2015-08-13 LAB — I-STAT CG4 LACTIC ACID, ED: LACTIC ACID, VENOUS: 1.37 mmol/L (ref 0.5–2.0)

## 2015-08-13 MED ORDER — VANCOMYCIN HCL IN DEXTROSE 750-5 MG/150ML-% IV SOLN
750.0000 mg | INTRAVENOUS | Status: DC
  Filled 2015-08-13: qty 150

## 2015-08-13 MED ORDER — MORPHINE SULFATE (PF) 2 MG/ML IV SOLN
INTRAVENOUS | Status: AC
  Administered 2015-08-13: 1 mg via INTRAVENOUS
  Filled 2015-08-13: qty 1

## 2015-08-13 MED ORDER — AMLODIPINE BESYLATE 5 MG PO TABS
5.0000 mg | ORAL_TABLET | Freq: Every day | ORAL | Status: DC
  Administered 2015-08-14 – 2015-08-15 (×2): 5 mg via ORAL
  Filled 2015-08-13 (×3): qty 1

## 2015-08-13 MED ORDER — HEPARIN SODIUM (PORCINE) 5000 UNIT/ML IJ SOLN
5000.0000 [IU] | Freq: Three times a day (TID) | INTRAMUSCULAR | Status: DC
  Administered 2015-08-13 – 2015-08-17 (×12): 5000 [IU] via SUBCUTANEOUS
  Filled 2015-08-13 (×12): qty 1

## 2015-08-13 MED ORDER — PRAVASTATIN SODIUM 40 MG PO TABS
40.0000 mg | ORAL_TABLET | Freq: Every day | ORAL | Status: DC
  Administered 2015-08-14 – 2015-08-17 (×4): 40 mg via ORAL
  Filled 2015-08-13 (×4): qty 1

## 2015-08-13 MED ORDER — SODIUM CHLORIDE 0.9% FLUSH
3.0000 mL | Freq: Two times a day (BID) | INTRAVENOUS | Status: DC
  Administered 2015-08-13 – 2015-08-17 (×8): 3 mL via INTRAVENOUS

## 2015-08-13 MED ORDER — GABAPENTIN 100 MG PO CAPS
100.0000 mg | ORAL_CAPSULE | Freq: Every day | ORAL | Status: DC
  Administered 2015-08-13 – 2015-08-16 (×4): 100 mg via ORAL
  Filled 2015-08-13 (×4): qty 1

## 2015-08-13 MED ORDER — DEXTROSE 5 % IV SOLN: 1.0000 g | Freq: Once | INTRAVENOUS | Status: DC

## 2015-08-13 MED ORDER — LIDOCAINE HCL (PF) 1 % IJ SOLN: 5.0000 mL | INTRAMUSCULAR | Status: DC | PRN

## 2015-08-13 MED ORDER — ALBUTEROL SULFATE (2.5 MG/3ML) 0.083% IN NEBU
2.5000 mg | INHALATION_SOLUTION | RESPIRATORY_TRACT | Status: DC
  Administered 2015-08-13: 2.5 mg via RESPIRATORY_TRACT
  Filled 2015-08-13: qty 3

## 2015-08-13 MED ORDER — ACETAMINOPHEN 650 MG RE SUPP: 650.0000 mg | Freq: Four times a day (QID) | RECTAL | Status: DC | PRN

## 2015-08-13 MED ORDER — VANCOMYCIN HCL IN DEXTROSE 750-5 MG/150ML-% IV SOLN
INTRAVENOUS | Status: AC
  Administered 2015-08-13: 750 mg via INTRAVENOUS
  Filled 2015-08-13: qty 150

## 2015-08-13 MED ORDER — VITAMIN D (ERGOCALCIFEROL) 1.25 MG (50000 UNIT) PO CAPS
50000.0000 [IU] | ORAL_CAPSULE | Freq: Every day | ORAL | Status: DC
  Administered 2015-08-14: 50000 [IU] via ORAL
  Filled 2015-08-13 (×2): qty 1

## 2015-08-13 MED ORDER — NEPRO/CARBSTEADY PO LIQD
237.0000 mL | Freq: Three times a day (TID) | ORAL | Status: DC | PRN
  Filled 2015-08-13: qty 237

## 2015-08-13 MED ORDER — SODIUM CHLORIDE 0.9 % IV SOLN: 100.0000 mL | INTRAVENOUS | Status: DC | PRN

## 2015-08-13 MED ORDER — INSULIN GLARGINE 100 UNIT/ML ~~LOC~~ SOLN
16.0000 [IU] | Freq: Every day | SUBCUTANEOUS | Status: DC
  Administered 2015-08-13 – 2015-08-14 (×2): 16 [IU] via SUBCUTANEOUS
  Filled 2015-08-13 (×3): qty 0.16

## 2015-08-13 MED ORDER — MORPHINE SULFATE (PF) 2 MG/ML IV SOLN
INTRAVENOUS | Status: AC
  Administered 2015-08-13: 2 mg via INTRAVENOUS
  Filled 2015-08-13: qty 1

## 2015-08-13 MED ORDER — HYDRALAZINE HCL 20 MG/ML IJ SOLN
10.0000 mg | Freq: Once | INTRAMUSCULAR | Status: AC
  Administered 2015-08-13: 10 mg via INTRAVENOUS
  Filled 2015-08-13: qty 1

## 2015-08-13 MED ORDER — OXYCODONE-ACETAMINOPHEN 5-325 MG PO TABS
1.0000 | ORAL_TABLET | Freq: Four times a day (QID) | ORAL | Status: DC | PRN
Start: 2015-08-13 — End: 2015-08-16

## 2015-08-13 MED ORDER — INSULIN ASPART 100 UNIT/ML ~~LOC~~ SOLN
0.0000 [IU] | Freq: Three times a day (TID) | SUBCUTANEOUS | Status: DC
  Administered 2015-08-15 – 2015-08-16 (×2): 1 [IU] via SUBCUTANEOUS
  Administered 2015-08-16: 3 [IU] via SUBCUTANEOUS
  Administered 2015-08-16: 2 [IU] via SUBCUTANEOUS
  Administered 2015-08-17 (×2): 1 [IU] via SUBCUTANEOUS
  Administered 2015-08-17: 2 [IU] via SUBCUTANEOUS

## 2015-08-13 MED ORDER — DEXTROSE 5 % IV SOLN
2.0000 g | INTRAVENOUS | Status: DC
  Administered 2015-08-14: 2 g via INTRAVENOUS
  Filled 2015-08-13 (×3): qty 2

## 2015-08-13 MED ORDER — CLONIDINE HCL 0.2 MG PO TABS
0.2000 mg | ORAL_TABLET | Freq: Two times a day (BID) | ORAL | Status: DC
  Administered 2015-08-13 – 2015-08-16 (×6): 0.2 mg via ORAL
  Filled 2015-08-13 (×6): qty 1

## 2015-08-13 MED ORDER — HEPARIN SODIUM (PORCINE) 1000 UNIT/ML DIALYSIS: 1000.0000 [IU] | INTRAMUSCULAR | Status: DC | PRN

## 2015-08-13 MED ORDER — MORPHINE SULFATE (PF) 2 MG/ML IV SOLN
1.0000 mg | Freq: Once | INTRAVENOUS | Status: AC
  Administered 2015-08-13: 1 mg via INTRAVENOUS

## 2015-08-13 MED ORDER — VANCOMYCIN HCL 10 G IV SOLR
1500.0000 mg | Freq: Once | INTRAVENOUS | Status: AC
  Administered 2015-08-13: 1500 mg via INTRAVENOUS
  Filled 2015-08-13 (×2): qty 1500

## 2015-08-13 MED ORDER — PENTAFLUOROPROP-TETRAFLUOROETH EX AERO: 1.0000 "application " | INHALATION_SPRAY | CUTANEOUS | Status: DC | PRN

## 2015-08-13 MED ORDER — IPRATROPIUM-ALBUTEROL 0.5-2.5 (3) MG/3ML IN SOLN: 3.0000 mL | RESPIRATORY_TRACT | Status: DC | PRN

## 2015-08-13 MED ORDER — MORPHINE SULFATE (PF) 2 MG/ML IV SOLN
2.0000 mg | Freq: Once | INTRAVENOUS | Status: AC
  Administered 2015-08-13: 2 mg via INTRAVENOUS

## 2015-08-13 MED ORDER — HYDROXYZINE HCL 25 MG PO TABS
25.0000 mg | ORAL_TABLET | Freq: Three times a day (TID) | ORAL | Status: DC | PRN
  Administered 2015-08-13: 25 mg via ORAL
  Filled 2015-08-13: qty 1

## 2015-08-13 MED ORDER — TRAMADOL HCL 50 MG PO TABS: 50.0000 mg | ORAL_TABLET | Freq: Three times a day (TID) | ORAL | Status: DC | PRN

## 2015-08-13 MED ORDER — IPRATROPIUM-ALBUTEROL 0.5-2.5 (3) MG/3ML IN SOLN
3.0000 mL | Freq: Two times a day (BID) | RESPIRATORY_TRACT | Status: DC
  Administered 2015-08-13 – 2015-08-17 (×7): 3 mL via RESPIRATORY_TRACT
  Filled 2015-08-13 (×7): qty 3

## 2015-08-13 MED ORDER — ONDANSETRON HCL 4 MG/2ML IJ SOLN
4.0000 mg | Freq: Four times a day (QID) | INTRAMUSCULAR | Status: DC | PRN
  Administered 2015-08-13: 4 mg via INTRAVENOUS
  Filled 2015-08-13: qty 2

## 2015-08-13 MED ORDER — ONDANSETRON HCL 4 MG PO TABS: 4.0000 mg | ORAL_TABLET | Freq: Four times a day (QID) | ORAL | Status: DC | PRN

## 2015-08-13 MED ORDER — IPRATROPIUM BROMIDE 0.02 % IN SOLN
0.5000 mg | RESPIRATORY_TRACT | Status: DC
  Administered 2015-08-13: 0.5 mg via RESPIRATORY_TRACT
  Filled 2015-08-13: qty 2.5

## 2015-08-13 MED ORDER — INSULIN ASPART 100 UNIT/ML ~~LOC~~ SOLN
6.0000 [IU] | Freq: Three times a day (TID) | SUBCUTANEOUS | Status: DC
Start: 2015-08-13 — End: 2015-08-15

## 2015-08-13 MED ORDER — ALTEPLASE 2 MG IJ SOLR: 2.0000 mg | Freq: Once | INTRAMUSCULAR | Status: DC | PRN

## 2015-08-13 MED ORDER — IPRATROPIUM-ALBUTEROL 0.5-2.5 (3) MG/3ML IN SOLN: 3.0000 mL | Freq: Four times a day (QID) | RESPIRATORY_TRACT | Status: DC

## 2015-08-13 MED ORDER — DEXTROSE 5 % IV SOLN: 2.0000 g | INTRAVENOUS | Status: DC

## 2015-08-13 MED ORDER — PRAVASTATIN SODIUM 40 MG PO TABS: 40.0000 mg | ORAL_TABLET | Freq: Every day | ORAL | Status: DC

## 2015-08-13 MED ORDER — CEFEPIME HCL 2 G IJ SOLR
2.0000 g | Freq: Once | INTRAMUSCULAR | Status: AC
  Administered 2015-08-13: 2 g via INTRAVENOUS
  Filled 2015-08-13: qty 2

## 2015-08-13 MED ORDER — HYDRALAZINE HCL 20 MG/ML IJ SOLN
10.0000 mg | INTRAMUSCULAR | Status: DC | PRN
  Administered 2015-08-13 (×2): 10 mg via INTRAVENOUS
  Filled 2015-08-13: qty 1
  Filled 2015-08-13: qty 0.5
  Filled 2015-08-13: qty 1

## 2015-08-13 MED ORDER — VITAMIN D (ERGOCALCIFEROL) 1.25 MG (50000 UNIT) PO CAPS
50000.0000 [IU] | ORAL_CAPSULE | Freq: Every day | ORAL | Status: DC
  Filled 2015-08-13: qty 1

## 2015-08-13 MED ORDER — ALBUTEROL SULFATE (2.5 MG/3ML) 0.083% IN NEBU: 2.5000 mg | INHALATION_SOLUTION | RESPIRATORY_TRACT | Status: DC | PRN

## 2015-08-13 MED ORDER — ACETAMINOPHEN 325 MG PO TABS
650.0000 mg | ORAL_TABLET | Freq: Four times a day (QID) | ORAL | Status: DC | PRN
Start: 2015-08-13 — End: 2015-08-17
  Administered 2015-08-13 – 2015-08-17 (×3): 650 mg via ORAL
  Filled 2015-08-13 (×2): qty 2

## 2015-08-13 MED ORDER — AMLODIPINE BESYLATE 5 MG PO TABS: 5.0000 mg | ORAL_TABLET | Freq: Every day | ORAL | Status: DC

## 2015-08-13 MED ORDER — DEXTROSE 5 % IV SOLN: 1.0000 g | Freq: Three times a day (TID) | INTRAVENOUS | Status: DC

## 2015-08-13 MED ORDER — DICLOFENAC SODIUM 1 % TD GEL
2.0000 g | Freq: Three times a day (TID) | TRANSDERMAL | Status: DC
Start: 2015-08-13 — End: 2015-08-13
  Filled 2015-08-13: qty 100

## 2015-08-13 MED ORDER — LIDOCAINE-PRILOCAINE 2.5-2.5 % EX CREA: 1.0000 "application " | TOPICAL_CREAM | CUTANEOUS | Status: DC | PRN

## 2015-08-13 MED ORDER — CLONIDINE HCL 0.2 MG PO TABS: 0.2000 mg | ORAL_TABLET | Freq: Two times a day (BID) | ORAL | Status: DC

## 2015-08-13 MED ORDER — VANCOMYCIN HCL IN DEXTROSE 750-5 MG/150ML-% IV SOLN
750.0000 mg | INTRAVENOUS | Status: DC
  Administered 2015-08-13 – 2015-08-15 (×2): 750 mg via INTRAVENOUS
  Filled 2015-08-13: qty 150

## 2015-08-13 MED ORDER — CAMPHOR-MENTHOL 0.5-0.5 % EX LOTN
1.0000 "application " | TOPICAL_LOTION | Freq: Three times a day (TID) | CUTANEOUS | Status: DC | PRN
  Administered 2015-08-13: 1 via TOPICAL
  Filled 2015-08-13 (×2): qty 222

## 2015-08-13 MED ORDER — CALCIUM ACETATE (PHOS BINDER) 667 MG PO CAPS
1334.0000 mg | ORAL_CAPSULE | Freq: Every day | ORAL | Status: DC
  Administered 2015-08-13 – 2015-08-17 (×5): 1334 mg via ORAL
  Filled 2015-08-13 (×5): qty 2

## 2015-08-13 NOTE — Progress Notes (Signed)
Pt transported to HD on BIPAP. No complications.

## 2015-08-13 NOTE — Progress Notes (Signed)
Hemodialysis- Pt tolerated procedure. Unable to UF d/t episodes of cramping. Pt now off bipap and on 2L [place] sats 96-98%. Total UF 1.8L d/t cramping and issues in previous notes. Pt currently denies pain. Report called to 3S. Pt transported in stable condition. Order placed for repeat K this afternoon per protocol.

## 2015-08-13 NOTE — ED Notes (Signed)
CT requests to be notified when [facility]

## 2015-08-13 NOTE — H&P (Signed)
History and Physical    Nicole Cox EAV:[id number] DOB: 1932/01/13 DOA: 08/13/2015  PCP: Joana Reamer, MD  Patient coming from: Home.  Chief Complaint: Shortness of breath.  HPI: Nicole Cox is a 82 y.o. female with medical history significant of ESRD on hemodialysis on Tuesday Thursday and Saturday, hypertension, COPD, CAD, chronic anemia presents to the ER because of increasing shortness of breath. Patient states she has been having shortness of breath off and on for the last 1 week. Patient was admitted in the beginning of the last month at [facility] for pneumonia. Chest x-ray today shows right-sided infiltrates concerning for pneumonia. Patient states she also has been having off-and-on pleuritic-type of chest pain when she coughs the last time she had it was 3 days ago. Patient states she did not miss her dialysis. Patient's blood pressure is also found to be elevated with systolic more than 180. Patient is being admitted for acute respiratory failure probably from pneumonia and also some contribution from fluid overload.  ED Course: Patient was placed on BiPAP and started on antibiotics. Patient was given IV hydralazine for elevated blood pressure.  Review of Systems: As per HPI otherwise 10 point review of systems negative.    Past Medical History  Diagnosis Date  . Renal disorder   . Diabetes mellitus without complication (HCC)   . Hypertension   . Heart disease   . COPD (chronic obstructive pulmonary disease) (HCC)   . Stroke (HCC)   . CHF (congestive heart failure) (HCC)   . Coronary artery disease     Past Surgical History  Procedure Laterality Date  . Abdominal hysterectomy    . Cholecystectomy    . Tonsillectomy       reports that she has quit smoking. She does not have any smokeless tobacco history on file. She reports that she does not drink alcohol or use illicit drugs.  No Known Allergies  Family History  Problem Relation Age of Onset  . Heart  disease Mother   . Hypertension Mother   . Heart disease Father   . Hypertension Father   . Heart disease Sister   . Diabetes Sister   . Stroke Sister   . Kidney disease Sister   . Hypertension Sister   . Heart disease Sister   . Kidney disease Sister   . Hypertension Sister   . Kidney disease Daughter   . Hypertension Daughter     Prior to Admission medications   Medication Sig Start Date End Date Taking? Authorizing Provider  acetaminophen (TYLENOL) 500 MG tablet Take 500 mg by mouth every 6 (six) hours as needed for pain.    Historical Provider, MD  amLODipine (NORVASC) 5 MG tablet Take 5 mg by mouth daily.    Historical Provider, MD  calcium acetate (PHOSLO) 667 MG capsule Take 1,334 mg by mouth daily.    Historical Provider, MD  cloNIDine (CATAPRES) 0.2 MG tablet Take 0.2 mg by mouth 2 (two) times daily.    Historical Provider, MD  diclofenac sodium (VOLTAREN) 1 % GEL Apply 2 g topically 4 (four) times daily. 08/16/12   Rhetta Mura, MD  gabapentin (NEURONTIN) 100 MG capsule Take 1 capsule (100 mg total) by mouth at bedtime. 08/16/12   Rhetta Mura, MD  insulin aspart (NOVOLOG) 100 UNIT/ML injection Inject 6 Units into the skin 3 (three) times daily with meals.    Historical Provider, MD  insulin glargine (LANTUS) 100 UNIT/ML injection Inject 16 Units into the skin at bedtime.  Historical Provider, MD  oxyCODONE-acetaminophen (PERCOCET/ROXICET) 5-325 MG per tablet Take 1-2 tablets by mouth every 6 (six) hours as needed. 08/16/12   Rhetta Mura, MD  pravastatin (PRAVACHOL) 40 MG tablet Take 40 mg by mouth daily.    Historical Provider, MD  PRESCRIPTION MEDICATION Inhale 2 puffs into the lungs daily as needed (inhailer).    Historical Provider, MD  traMADol (ULTRAM) 50 MG tablet Take 50 mg by mouth every 8 (eight) hours as needed for pain.     Historical Provider, MD  Vitamin D, Ergocalciferol, (DRISDOL) 50000 UNITS CAPS Take 50,000 Units by mouth daily.     Historical Provider, MD    Physical Exam: Filed Vitals:   08/13/15 0415 08/13/15 0430 08/13/15 0445 08/13/15 0500  BP: 186/65 189/76 188/71   Pulse: 86 84 89 88  Temp:      TempSrc:      Resp: 18 17 24 24   Height:      Weight:      SpO2: 97% 98% 97% 97%      Constitutional: Not in distress after patient was placed on BiPAP. Filed Vitals:   08/13/15 0415 08/13/15 0430 08/13/15 0445 08/13/15 0500  BP: 186/65 189/76 188/71   Pulse: 86 84 89 88  Temp:      TempSrc:      Resp: 18 17 24 24   Height:      Weight:      SpO2: 97% 98% 97% 97%   Eyes: Anicteric no pallor. ENMT: No discharge from the ears eyes nose or mouth. Neck: No JVD appreciated no mass felt. Respiratory: Mild expiratory wheeze heard no crepitations. Cardiovascular: S1-S2 heard. Abdomen: Soft nontender bowel sounds present. Musculoskeletal: No edema. Skin: No rash. Neurologic: Alert awake oriented to time place and person. Moves all extremities. Psychiatric: Appears normal.   Labs on Admission: I have personally reviewed following labs and imaging studies  CBC:  Recent Labs Lab 08/13/15 0240  WBC 10.0  NEUTROABS 6.1  HGB 9.6*  HCT 31.5*  MCV 101.0*  PLT 279   Basic Metabolic Panel:  Recent Labs Lab 08/13/15 0240  NA 139  K 5.2*  CL 102  CO2 23  GLUCOSE 113*  BUN 63*  CREATININE 9.89*  CALCIUM 8.9   GFR: Estimated Creatinine Clearance: 4.2 mL/min (by C-G formula based on Cr of 9.89). Liver Function Tests:  Recent Labs Lab 08/13/15 0240  AST 29  ALT 26  ALKPHOS 148*  BILITOT 0.7  PROT 7.3  ALBUMIN 3.4*   No results for input(s): LIPASE, AMYLASE in the last 168 hours. No results for input(s): AMMONIA in the last 168 hours. Coagulation Profile: No results for input(s): INR, PROTIME in the last 168 hours. Cardiac Enzymes:  Recent Labs Lab 08/13/15 0240  TROPONINI 0.05*   BNP (last 3 results) No results for input(s): PROBNP in the last 8760 hours. HbA1C: No results  for input(s): HGBA1C in the last 72 hours. CBG: No results for input(s): GLUCAP in the last 168 hours. Lipid Profile: No results for input(s): CHOL, HDL, LDLCALC, TRIG, CHOLHDL, LDLDIRECT in the last 72 hours. Thyroid Function Tests: No results for input(s): TSH, T4TOTAL, FREET4, T3FREE, THYROIDAB in the last 72 hours. Anemia Panel: No results for input(s): VITAMINB12, FOLATE, FERRITIN, TIBC, IRON, RETICCTPCT in the last 72 hours. Urine analysis:    Component Value Date/Time   COLORURINE YELLOW 08/11/2012 1917   APPEARANCEUR CLOUDY* 08/11/2012 1917   LABSPEC 1.013 08/11/2012 1917   PHURINE 7.5 08/11/2012 1917  GLUCOSEU 100* 08/11/2012 1917   HGBUR TRACE* 08/11/2012 1917   BILIRUBINUR NEGATIVE 08/11/2012 1917   KETONESUR NEGATIVE 08/11/2012 1917   PROTEINUR >300* 08/11/2012 1917   UROBILINOGEN 0.2 08/11/2012 1917   NITRITE NEGATIVE 08/11/2012 1917   LEUKOCYTESUR NEGATIVE 08/11/2012 1917   Sepsis Labs: @LABRCNTIP (procalcitonin:4,lacticidven:4) )No results found for this or any previous visit (from the past 240 hour(s)).   Radiological Exams on Admission: Dg Chest 2 View  08/13/2015  CLINICAL DATA:  Acute onset of cough and shortness of breath. Initial encounter. EXAM: CHEST  2 VIEW COMPARISON:  Chest radiograph performed 08/14/2012 FINDINGS: The lungs are well-aerated. Diffuse right-sided airspace opacification raises concern for pneumonia. Asymmetric interstitial edema might have a similar appearance. Underlying vascular congestion is noted. There is no evidence of pleural effusion or pneumothorax. The heart is mildly enlarged. No acute osseous abnormalities are seen. IMPRESSION: 1. Diffuse right-sided airspace opacification raises concern for pneumonia. Asymmetric interstitial edema might have a similar appearance. 2. Underlying vascular congestion and mild cardiomegaly. Electronically Signed   By: Roanna RaiderJeffery  Chang M.D.   On: 08/13/2015 02:53    EKG: Independently reviewed. Normal  sinus rhythm with LVH and secondary repolarization changes.  Assessment/Plan Principal Problem:   Acute respiratory failure with hypoxia (HCC) Active Problems:   HCAP (healthcare-associated pneumonia)   ESRD on hemodialysis (HCC)   Hypertensive urgency   Chronic anemia   Acute respiratory failure (HCC)    #1. Acute respiratory failure with hypoxia probably a combination of pneumonia and mild to fluid overload with uncontrolled blood pressure - patient has been placed on vancomycin and cefepime for pneumonia and presently on BiPAP. Patient is also on when necessary IV hydralazine for uncontrolled blood pressure and I think blood pressure will improve with patient taking home medications and dialysis. Will request early dialysis from nephrologist. For now continue BiPAP and will try to be enough after dialysis. #2. Hypertensive urgency - continue home dose of clonidine and Norvasc and patient is on when necessary IV hydralazine for now. See #1. If blood pressure does not improve then we will consider starting IV nitroglycerin infusion. #3. Pleuritic type of chest pain - probably from pneumonia. Patient has persistent infiltrates on the right side which was that even in [facility]. We'll get a CT angiogram of the chest to further study. Check cardiac markers. #4. ESRD on hemodialysis on Tuesday Thursday and Saturday - patient states she did not miss her dialysis on Saturday. Will request nephrology for early dialysis. Continue BiPAP. #5. Chronic anemia probably from ESRD - follow CBC. #6. COPD - mild wheezing. I have placed patient on nebulizer.   DVT prophylaxis: Heparin. Code Status: Full code.  Family Communication: No family at the bedside.  Disposition Plan: Home.  Consults called: Will try to reach nephrologist.  Admission status: Inpatient. Stepdown. Likely stay 2 days.    Eduard ClosKAKRAKANDY,Georgenia Salim N. MD [facility] Pager [phone]  [phone].  If 7PM-7AM, please contact night-coverage [email or web address] Password [email or web address]  08/13/2015, 6:00 AM

## 2015-08-13 NOTE — ED Notes (Signed)
Pt arrives via EMS from home. Pt gets dialysis Tue Thurs Sat. States that she normally gets short of breath in the middle of the night and uses her inhaler which helps. States tonight it did not help tonight and called EMS. EMS reports pt was 91% on her chornic 2L [place]. EMS started her on 10L non-rebreather and gave her 5albuterol. EMS was then able to transition her back to her 2L [place]. Pt c/o cough x 1 week.

## 2015-08-13 NOTE — ED Provider Notes (Signed)
CSN: [id number]     Arrival date & time 08/13/15  0221 History   By signing my name below, I, [name], attest that this documentation has been prepared under the direction and in the presence of Gilda Creasehristopher J Pollina, MD. Electronically Signed: [name], ED Scribe. 08/13/2015. 2:40 AM   Chief Complaint  Patient presents with  . Shortness of Breath    The history is provided by the patient and the EMS personnel.   Brought in by EMS from home, Nicole Cox is a 82 y.o. femalewith PMHx of ESRD on T/TH/S hemodialysis, CHF, COPD, DM, and HTN who presents to the Emergency Department complaining of constant SOB with onset tonight. The pt is on 2 L of oxygen at all times and has intermittent SOB but tonight's episode was worse than usual and did not resolve on its own.  Associated symptoms include dry cough for 1 week and chills. She was treated for pneumonia 2 months ago.  Pt denies fever.   Past Medical History  Diagnosis Date  . Renal disorder   . Diabetes mellitus without complication (HCC)   . Hypertension   . Heart disease   . COPD (chronic obstructive pulmonary disease) (HCC)   . Stroke (HCC)   . CHF (congestive heart failure) (HCC)   . Coronary artery disease    Past Surgical History  Procedure Laterality Date  . Abdominal hysterectomy    . Cholecystectomy    . Tonsillectomy     Family History  Problem Relation Age of Onset  . Heart disease Mother   . Hypertension Mother   . Heart disease Father   . Hypertension Father   . Heart disease Sister   . Diabetes Sister   . Stroke Sister   . Kidney disease Sister   . Hypertension Sister   . Heart disease Sister   . Kidney disease Sister   . Hypertension Sister   . Kidney disease Daughter   . Hypertension Daughter    Social History  Substance Use Topics  . Smoking status: Former Games developermoker  . Smokeless tobacco: None  . Alcohol Use: No   OB History    No data available     Review of Systems   Constitutional: Positive for chills. Negative for fever.  Respiratory: Positive for cough and shortness of breath.   All other systems reviewed and are negative.  Allergies  Review of patient's allergies indicates no known allergies.  Home Medications   Prior to Admission medications   Medication Sig Start Date End Date Taking? Authorizing Provider  acetaminophen (TYLENOL) 500 MG tablet Take 500 mg by mouth every 6 (six) hours as needed for pain.    Historical Provider, MD  amLODipine (NORVASC) 5 MG tablet Take 5 mg by mouth daily.    Historical Provider, MD  calcium acetate (PHOSLO) 667 MG capsule Take 1,334 mg by mouth daily.    Historical Provider, MD  cloNIDine (CATAPRES) 0.2 MG tablet Take 0.2 mg by mouth 2 (two) times daily.    Historical Provider, MD  diclofenac sodium (VOLTAREN) 1 % GEL Apply 2 g topically 4 (four) times daily. 08/16/12   Rhetta MuraJai-Gurmukh Samtani, MD  gabapentin (NEURONTIN) 100 MG capsule Take 1 capsule (100 mg total) by mouth at bedtime. 08/16/12   Rhetta MuraJai-Gurmukh Samtani, MD  insulin aspart (NOVOLOG) 100 UNIT/ML injection Inject 6 Units into the skin 3 (three) times daily with meals.    Historical Provider, MD  insulin glargine (LANTUS) 100 UNIT/ML injection Inject 16 Units  into the skin at bedtime.    Historical Provider, MD  oxyCODONE-acetaminophen (PERCOCET/ROXICET) 5-325 MG per tablet Take 1-2 tablets by mouth every 6 (six) hours as needed. 08/16/12   Rhetta Mura, MD  pravastatin (PRAVACHOL) 40 MG tablet Take 40 mg by mouth daily.    Historical Provider, MD  PRESCRIPTION MEDICATION Inhale 2 puffs into the lungs daily as needed (inhailer).    Historical Provider, MD  traMADol (ULTRAM) 50 MG tablet Take 50 mg by mouth every 8 (eight) hours as needed for pain.     Historical Provider, MD  Vitamin D, Ergocalciferol, (DRISDOL) 50000 UNITS CAPS Take 50,000 Units by mouth daily.    Historical Provider, MD   BP 171/77 mmHg  Pulse 85  Temp(Src) 98.5 F (36.9 C) (Oral)   Resp 16  SpO2 98% Physical Exam  Constitutional: She is oriented to person, place, and time. She appears well-developed and well-nourished. No distress.  HENT:  Head: Normocephalic and atraumatic.  Right Ear: Hearing normal.  Left Ear: Hearing normal.  Nose: Nose normal.  Mouth/Throat: Oropharynx is clear and moist and mucous membranes are normal.  Eyes: Conjunctivae and EOM are normal. Pupils are equal, round, and reactive to light.  Neck: Normal range of motion. Neck supple.  Cardiovascular: Regular rhythm, S1 normal and S2 normal.  Exam reveals no gallop and no friction rub.   No murmur heard. Pulmonary/Chest: Effort normal. Tachypnea noted. No respiratory distress. She exhibits no tenderness.  Bibasilar crackles (R>L)  Abdominal: Soft. Normal appearance and bowel sounds are normal. There is no hepatosplenomegaly. There is no tenderness. There is no rebound, no guarding, no tenderness at McBurney's point and negative Murphy's sign. No hernia.  Musculoskeletal: Normal range of motion.  Neurological: She is alert and oriented to person, place, and time. She has normal strength. No cranial nerve deficit or sensory deficit. Coordination normal. GCS eye subscore is 4. GCS verbal subscore is 5. GCS motor subscore is 6.  Skin: Skin is warm, dry and intact. No rash noted. No cyanosis.  Psychiatric: She has a normal mood and affect. Her speech is normal and behavior is normal. Thought content normal.  Nursing note and vitals reviewed.   ED Course  Procedures (including critical care time) DIAGNOSTIC STUDIES: Oxygen Saturation is 98% on 2L, adequate by my interpretation.    COORDINATION OF CARE: 2:29 AM Discussed treatment plan which includes lab work, CXR, EKG with pt at bedside and pt agreed to plan.  Labs Review Labs Reviewed  CBC WITH DIFFERENTIAL/PLATELET - Abnormal; Notable for the following:    RBC 3.12 (*)    Hemoglobin 9.6 (*)    HCT 31.5 (*)    MCV 101.0 (*)    RDW 16.0 (*)     All other components within normal limits  COMPREHENSIVE METABOLIC PANEL - Abnormal; Notable for the following:    Potassium 5.2 (*)    Glucose, Bld 113 (*)    BUN 63 (*)    Creatinine, Ser 9.89 (*)    Albumin 3.4 (*)    Alkaline Phosphatase 148 (*)    GFR calc non Af Amer 3 (*)    GFR calc Af Amer 4 (*)    All other components within normal limits  TROPONIN I - Abnormal; Notable for the following:    Troponin I 0.05 (*)    All other components within normal limits  CULTURE, BLOOD (ROUTINE X 2)  CULTURE, BLOOD (ROUTINE X 2)  I-STAT CG4 LACTIC ACID, ED  Imaging Review Dg Chest 2 View  08/13/2015  CLINICAL DATA:  Acute onset of cough and shortness of breath. Initial encounter. EXAM: CHEST  2 VIEW COMPARISON:  Chest radiograph performed 08/14/2012 FINDINGS: The lungs are well-aerated. Diffuse right-sided airspace opacification raises concern for pneumonia. Asymmetric interstitial edema might have a similar appearance. Underlying vascular congestion is noted. There is no evidence of pleural effusion or pneumothorax. The heart is mildly enlarged. No acute osseous abnormalities are seen. IMPRESSION: 1. Diffuse right-sided airspace opacification raises concern for pneumonia. Asymmetric interstitial edema might have a similar appearance. 2. Underlying vascular congestion and mild cardiomegaly. Electronically Signed   By: Roanna Raider M.D.   On: 08/13/2015 02:53   I have personally reviewed and evaluated these images and lab results as part of my medical decision-making.   EKG Interpretation   Date/Time:  Tuesday Aug 13 2015 02:33:58 EDT Ventricular Rate:  86 PR Interval:  144 QRS Duration: 74 QT Interval:  372 QTC Calculation: 445 R Axis:   22 Text Interpretation:  Sinus rhythm Probable LVH with secondary repol abnrm  No significant change since last tracing Confirmed by POLLINA  MD,  CHRISTOPHER [phone]) on 08/13/2015 2:40:12 AM      MDM   Final diagnoses:  None   HCAP  Patient brought to the emergency part in by ambulance for shortness of breath. Patient is a dialysis patient, receives dialysis Tuesday Thursday and Saturday. She has been compliant with her dialysis. She does report some chronic lung disease for which he takes albuterol. She reports that she often gets slightly short of breath at night but it improves, tonight her breathing worsened rather than improved so she called EMS. EMS described coarse breath sounds for which they administered albuterol. She has been placed on supplemental oxygen. She is normally on 2 L at home, was placed on a nonrebreather initially but was weaned back to cannula during transport.  Patient does have a cough. She reports that that has been present for at least a week. She was treated for pneumonia as an outpatient 2 months ago. She has also been admitted to [facility] since then.  Chest x-ray shows evidence of right-sided pneumonia. This would constitute a healthcare associated pneumonia because of her recent hospitalization and chronic dialysis. She was administered cefepime and vancomycin, will be admitted to the hospital for further management.  I personally performed the services described in this documentation, which was scribed in my presence. The recorded information has been reviewed and is accurate.    Gilda Crease, MD 08/13/15 [phone]

## 2015-08-13 NOTE — Procedures (Signed)
  I was present at this dialysis session, have reviewed the session itself and made  appropriate changes Vinson Moselleob Khyri Hinzman MD [facility] pager [phone]    cell [phone] 08/13/2015, 10:26 AM

## 2015-08-13 NOTE — Progress Notes (Signed)
Hemodialysis- Pt complaints of arm pain and neck pain. Dr. Arlean HoppingSchertz at bedside. HD stopped and blood recirculated until ordered EKG performed. Continue to monitor patient. Denies chest pain and dyspnea.

## 2015-08-13 NOTE — Progress Notes (Signed)
Hemo- Pt transported to room. Upon entering unit pt complained of nausea, approx 20cc green emesis.

## 2015-08-13 NOTE — Progress Notes (Signed)
Pharmacy Antibiotic Note  Nicole Cox is a 82 y.o. female admitted on 08/13/2015 with pneumonia.  Pharmacy has been consulted for vancomycin dosing. Pt has a history of ESRD on HD TTS and presents with SOB.   Pt received vancomycin 1500mg  and cefepime 2g IV once in the ED.    Plan: Vancomycin 750mg  IV qHD Recommend cefepime 2g IV qHD if continued Monitor culture data, HD plans and clinical course VT at SS prn (pre-HD target 15-25 mcg/mL)  Height: 5' 2.99" (160 cm) Weight: 157 lb 3.2 oz (71.305 kg) IBW/kg (Calculated) : 52.38  Temp (24hrs), Avg:98.5 F (36.9 C), Min:98.5 F (36.9 C), Max:98.5 F (36.9 C)   Recent Labs Lab 08/13/15 0240  WBC 10.0  CREATININE 9.89*    Estimated Creatinine Clearance: 4.2 mL/min (by C-G formula based on Cr of 9.89).    No Known Allergies  Antimicrobials this admission: Cefepime 5/30 >>  Vanc 5/30 >>   Dose adjustments this admission: n/a  Microbiology results:  BCx:   UCx:    Sputum:    MRSA PCR:    Arlean Hoppingorey M. Newman PiesBall, PharmD, BCPS Clinical Pharmacist Pager [phone] 08/13/2015 3:48 AM

## 2015-08-13 NOTE — Progress Notes (Signed)
Patient ID: Nicole DecantLizzie Walko, female   DOB: 11-03-1931, 82 y.o.   MRN: [id number]  PROGRESS NOTE    Nicole Cox  [id number] DOB: 11-03-1931 DOA: 08/13/2015  PCP: [name],J STEWART, MD   Brief Narrative:   Pt admitted after midnight, for details please refer to admission note on 08/13/2015. 82 y.o. female with past medical history significant for ESRD on hemodialysis on Tuesday, Thursday and Saturday, hypertension, COPD, CAD, chronic anemia who [facility] ED with worsening shortness of breath for past 1 week prior to this admission in addition to associated pleuritic chest pain. She has been recently hospitalized at [facility] for pneumonia.  In ED, BP was as high as 234/67 and then 194/61. Blood work showed hemoglobin of 9.6, potassium 5.2, Cr 9.89, mild troponin elevation of 0.05, normal lactic acid. CXR showed diffuse right sided airspace opacification raising concern for pneumonia. She was started on vanco and cefepime for treatment of possible HCAP.   Assessment & Plan:   Principal Problem:   Acute respiratory failure with hypoxia (HCC) / HCAP (healthcare-associated pneumonia) - Currently on BiPAP, oxygen saturation 97% - Continue vanco and cefepime for treatmetn of HCAP - Continue current nebulizer treatment: duoneb BID and every 4 hours as needed for shortness of breath or wheezing  Active Problems:   ESRD on hemodialysis (HCC) - On HD TuThSat - Nephrology consulted     Hypertensive urgency - BP 168/58 - Improved with as needed hydralazine - On scheduled Norvasc and clonidine     DVT prophylaxis: Heparin subQ  Code Status: full code  Family Communication: no family at the bedside this am  Disposition Plan: home once stable in regards to respiratory status    Consultants:   Nephrology   Procedures:   HD (TuThSat)  Antimicrobials:   Cefepime and vanco 08/13/2015 -->     Subjective: Says she feels little better.    Objective: Filed Vitals:   08/13/15 0515 08/13/15 0530 08/13/15 0609 08/13/15 0630  BP: 186/68 188/64 234/67 194/61  Pulse: 80 92 102 100  Temp:      TempSrc:      Resp: 25 17 28 24   Height:      Weight:      SpO2: 95% 98% 100% 100%    Intake/Output Summary (Last 24 hours) at 08/13/15 0649 Last data filed at 08/13/15 0444  Gross per 24 hour  Intake     50 ml  Output      0 ml  Net     50 ml   Filed Weights   08/13/15 0351  Weight: 71.305 kg (157 lb 3.2 oz)    Examination:  General exam: Appears calm and comfortable, BiPAP Respiratory system: Diminished breath sounds, wheezing in upper and mid lung lobes  Cardiovascular system: S1 & S2 (+), tachycardia  Gastrointestinal system: obese abd, (+) BS, non tender Central nervous system: Alert and oriented. No focal neurological deficits. Extremities: no tenderness, palpable pulses  Skin: No rashes, lesions or ulcers Psychiatry: Mood & affect appropriate.   Data Reviewed: I have personally reviewed following labs and imaging studies  CBC:  Recent Labs Lab 08/13/15 0240  WBC 10.0  NEUTROABS 6.1  HGB 9.6*  HCT 31.5*  MCV 101.0*  PLT 279   Basic Metabolic Panel:  Recent Labs Lab 08/13/15 0240  NA 139  K 5.2*  CL 102  CO2 23  GLUCOSE 113*  BUN 63*  CREATININE 9.89*  CALCIUM 8.9   GFR: Estimated Creatinine Clearance: 4.2 mL/min (  by C-G formula based on Cr of 9.89). Liver Function Tests:  Recent Labs Lab 08/13/15 0240  AST 29  ALT 26  ALKPHOS 148*  BILITOT 0.7  PROT 7.3  ALBUMIN 3.4*   No results for input(s): LIPASE, AMYLASE in the last 168 hours. No results for input(s): AMMONIA in the last 168 hours. Coagulation Profile: No results for input(s): INR, PROTIME in the last 168 hours. Cardiac Enzymes:  Recent Labs Lab 08/13/15 0240  TROPONINI 0.05*   BNP (last 3 results) No results for input(s): PROBNP in the last 8760 hours. HbA1C: No results for input(s): HGBA1C in the last 72 hours. CBG: No results for input(s):  GLUCAP in the last 168 hours. Lipid Profile: No results for input(s): CHOL, HDL, LDLCALC, TRIG, CHOLHDL, LDLDIRECT in the last 72 hours. Thyroid Function Tests: No results for input(s): TSH, T4TOTAL, FREET4, T3FREE, THYROIDAB in the last 72 hours. Anemia Panel: No results for input(s): VITAMINB12, FOLATE, FERRITIN, TIBC, IRON, RETICCTPCT in the last 72 hours. Urine analysis:    Component Value Date/Time   COLORURINE YELLOW 08/11/2012 1917   APPEARANCEUR CLOUDY* 08/11/2012 1917   LABSPEC 1.013 08/11/2012 1917   PHURINE 7.5 08/11/2012 1917   GLUCOSEU 100* 08/11/2012 1917   HGBUR TRACE* 08/11/2012 1917   BILIRUBINUR NEGATIVE 08/11/2012 1917   KETONESUR NEGATIVE 08/11/2012 1917   PROTEINUR >300* 08/11/2012 1917   UROBILINOGEN 0.2 08/11/2012 1917   NITRITE NEGATIVE 08/11/2012 1917   LEUKOCYTESUR NEGATIVE 08/11/2012 1917   Sepsis Labs: (procalcitonin:4,lacticidven:4)   )No results found for this or any previous visit (from the past 240 hour(s)).    Radiology Studies: Dg Chest 2 View 08/13/2015  1. Diffuse right-sided airspace opacification raises concern for pneumonia. Asymmetric interstitial edema might have a similar appearance. 2. Underlying vascular congestion and mild cardiomegaly. Electronically Signed   By: Roanna Raider M.D.   On: 08/13/2015 02:53    Scheduled Meds: . albuterol  2.5 mg Nebulization Q4H  . amLODipine  5 mg Oral Daily  . calcium acetate  1,334 mg Oral Daily  . cloNIDine  0.2 mg Oral BID  . diclofenac sodium  2 g Topical QID  . gabapentin  100 mg Oral QHS  . heparin  5,000 Units Subcutaneous Q8H  . insulin aspart  0-9 Units Subcutaneous TID WC  . insulin aspart  6 Units Subcutaneous TID WC  . insulin glargine  16 Units Subcutaneous QHS  . ipratropium  0.5 mg Nebulization Q4H  . pravastatin  40 mg Oral Daily  . sodium chloride flush  3 mL Intravenous Q12H  . [START ON 08/14/2015] vancomycin  750 mg Intravenous Q T,Th,Sa-HD  . Vitamin D  (Ergocalciferol)  50,000 Units Oral Daily   Continuous Infusions: . [START ON 08/14/2015] ceFEPime (MAXIPIME) IV       LOS: 0 days    Time spent: 15 minutes  Greater than 50% of the time spent on counseling and coordinating the care.   Manson Passey, MD [facility] Pager [phone]  If 7PM-7AM, please contact night-coverage [email or web address] Password [facility] 08/13/2015, 6:49 AM

## 2015-08-13 NOTE — ED Notes (Signed)
Pt refusing blood draw from phlebotomy.   

## 2015-08-13 NOTE — ED Notes (Signed)
Pt on bipap. States "i feel much better".

## 2015-08-13 NOTE — Consult Note (Addendum)
Renal Service Consult Note [facility]  Al DecantLizzie Ardoin 08/13/2015 Delano MetzSCHERTZ,Dhana Totton D Requesting Physician:  Dr. Elisabeth Pigeonevine  Reason for Consult:  ESRD pt acute resp failure HPI: The patient is a 82 y.o. year-old with hx of DM, HTN, COPD , CVA and ESRD on HD in [place] on TTS schedule. On HD for about 4 years.  Pt presented with SOB early am today, also cough dry for 1 wk, some chills, no fevers.  Was rx for PNA 2 mos ago.  Is on home O2 at 2L baseline.  Asked to see for ESRD.    Patient denies any hx heart disease/ MI/ stents.  Has had hx of nondisplaced dens/ C1 fracture by imaging, also hx abnormal T4/5 by MRI w assoc hand pain in 2014.  DM on insulin, HL, anemia of CKD. COPD on home O2.    Patient was living w her husband and son in [place], they all moved in with pt's daughter one week ago, Maurie BoettcherShekoya Hatcher, in [place].  She doesn't know where her new HD unit is.  No etoh / tob.  [place] up in [place]  Worked in daycare.  Poor historian.  I spoke w pt's daughter who says pt is now [facility] [place] on [address]      Linus OrnOS  has some CP on Saturday (3d ago)  no joint pain   no HA  no blurry vision  no rash  no diarrhea  no nausea/ vomiting  no dysuria  no difficulty voiding  no change in urine color    Past Medical History  Past Medical History  Diagnosis Date  . Renal disorder   . Diabetes mellitus without complication (HCC)   . Hypertension   . Heart disease   . COPD (chronic obstructive pulmonary disease) (HCC)   . Stroke (HCC)   . CHF (congestive heart failure) (HCC)   . Coronary artery disease    Past Surgical History  Past Surgical History  Procedure Laterality Date  . Abdominal hysterectomy    . Cholecystectomy    . Tonsillectomy     Family History  Family History  Problem Relation Age of Onset  . Heart disease Mother   . Hypertension Mother   . Heart disease Father   . Hypertension Father   . Heart disease Sister   .  Diabetes Sister   . Stroke Sister   . Kidney disease Sister   . Hypertension Sister   . Heart disease Sister   . Kidney disease Sister   . Hypertension Sister   . Kidney disease Daughter   . Hypertension Daughter    Social History  reports that she has quit smoking. She does not have any smokeless tobacco history on file. She reports that she does not drink alcohol or use illicit drugs. Allergies No Known Allergies Home medications Prior to Admission medications   Medication Sig Start Date End Date Taking? Authorizing Provider  acetaminophen (TYLENOL) 500 MG tablet Take 500 mg by mouth every 6 (six) hours as needed for pain.    Historical Provider, MD  amLODipine (NORVASC) 5 MG tablet Take 5 mg by mouth daily.    Historical Provider, MD  calcium acetate (PHOSLO) 667 MG capsule Take 1,334 mg by mouth daily.    Historical Provider, MD  cloNIDine (CATAPRES) 0.2 MG tablet Take 0.2 mg by mouth 2 (two) times daily.    Historical Provider, MD  diclofenac sodium (VOLTAREN) 1 % GEL Apply 2 g  topically 4 (four) times daily. 08/16/12   Rhetta Mura, MD  gabapentin (NEURONTIN) 100 MG capsule Take 1 capsule (100 mg total) by mouth at bedtime. 08/16/12   Rhetta Mura, MD  insulin aspart (NOVOLOG) 100 UNIT/ML injection Inject 6 Units into the skin 3 (three) times daily with meals.    Historical Provider, MD  insulin glargine (LANTUS) 100 UNIT/ML injection Inject 16 Units into the skin at bedtime.    Historical Provider, MD  oxyCODONE-acetaminophen (PERCOCET/ROXICET) 5-325 MG per tablet Take 1-2 tablets by mouth every 6 (six) hours as needed. 08/16/12   Rhetta Mura, MD  pravastatin (PRAVACHOL) 40 MG tablet Take 40 mg by mouth daily.    Historical Provider, MD  PRESCRIPTION MEDICATION Inhale 2 puffs into the lungs daily as needed (inhailer).    Historical Provider, MD  traMADol (ULTRAM) 50 MG tablet Take 50 mg by mouth every 8 (eight) hours as needed for pain.     Historical Provider, MD   Vitamin D, Ergocalciferol, (DRISDOL) 50000 UNITS CAPS Take 50,000 Units by mouth daily.    Historical Provider, MD   Liver Function Tests  Recent Labs Lab 08/13/15 0240 08/13/15 0908  AST 29 24  ALT 26 24  ALKPHOS 148* 120  BILITOT 0.7 0.7  PROT 7.3 7.1  ALBUMIN 3.4* 3.2*   No results for input(s): LIPASE, AMYLASE in the last 168 hours. CBC  Recent Labs Lab 08/13/15 0240 08/13/15 0908  WBC 10.0 10.9*  NEUTROABS 6.1 8.2*  HGB 9.6* 9.3*  HCT 31.5* 30.5*  MCV 101.0* 100.0  PLT 279 252   Basic Metabolic Panel  Recent Labs Lab 08/13/15 0240 08/13/15 0908  NA 139 140  K 5.2* 6.9*  CL 102 106  CO2 23 21*  GLUCOSE 113* 109*  BUN 63* 67*  CREATININE 9.89* 10.52*  CALCIUM 8.9 8.5*    Filed Vitals:   08/13/15 0900 08/13/15 0926 08/13/15 0943 08/13/15 0953  BP: 181/72 166/63 158/63 142/66  Pulse: 85 84 88 90  Temp:      TempSrc:      Resp: Height:      Weight:      SpO2: 100% 99% 100% 100%   Exam Alert, on bipap, breathing comfotrably No rash, cyanosis or gangrene Sclera anicteric  No jvd or bruits Chest clear bilat to bases RRR no MRG Abd soft ntnd no mass or ascites +bs GU deferred MS no joint effusions or deformity Ext no LE or UE edema / no wounds or ulcers Neuro is alert, Ox 3 , nf  CXR 5/30 - diffuse R-sided infiltrates, edema vs PNA  Dialysis: TTS uncertain location, pending    Assessment: 1. Acute resp failure 2. Hypoxemia 3. Pulm edema (vs PNA) 4. Hx COPD home O2 5. HTN norvasc/ clonidine 6. DM on insulin 7. Hyperkalemia   Plan - HD upstairs this am  Vinson Moselle MD [facility] pager [phone]    cell [phone] 08/13/2015, 10:04 AM

## 2015-08-13 NOTE — Progress Notes (Signed)
Rt took pt off BIPAP/NIV and placed on 2 PLM [place]. Pt has no wob and states her breathng is much better. Pt going to 3S12.

## 2015-08-13 NOTE — ED Notes (Signed)
Pt refusing IV start from IV team

## 2015-08-13 NOTE — Progress Notes (Signed)
Hemodialysis- Pt restarted back on treatment per Dr. Arlean HoppingSchertz. Given 2mg  morphine iv for pain. Currently UF off d/t cramping. Pt also complaining of itching. Sarna lotion ordered. Continue to monitor closely.

## 2015-08-13 NOTE — Progress Notes (Signed)
CRITICAL VALUE ALERT  Critical value received:  K=6.9  Date of notification:  08/13/15  Time of notification:  0944  Critical value read back:yes  Nurse who received alert:  Roosvelt HarpsKristyn Deziyah Arvin RN  MD notified (1st page):  Schertz  Time of first page: on unit  MD notified (2nd page):  Time of second page:  Responding MD:  Arlean HoppingSchertz  Time MD responded:  On unit

## 2015-08-14 ENCOUNTER — Inpatient Hospital Stay (HOSPITAL_COMMUNITY): Payer: Medicare HMO

## 2015-08-14 DIAGNOSIS — Z992 Dependence on renal dialysis: Secondary | ICD-10-CM

## 2015-08-14 DIAGNOSIS — J189 Pneumonia, unspecified organism: Secondary | ICD-10-CM

## 2015-08-14 DIAGNOSIS — I160 Hypertensive urgency: Secondary | ICD-10-CM

## 2015-08-14 DIAGNOSIS — D649 Anemia, unspecified: Secondary | ICD-10-CM

## 2015-08-14 DIAGNOSIS — N186 End stage renal disease: Secondary | ICD-10-CM

## 2015-08-14 DIAGNOSIS — J9601 Acute respiratory failure with hypoxia: Secondary | ICD-10-CM

## 2015-08-14 LAB — CBC
HCT: 28.5 % — ABNORMAL LOW (ref 36.0–46.0)
Hemoglobin: 8.6 g/dL — ABNORMAL LOW (ref 12.0–15.0)
MCH: 30.5 pg (ref 26.0–34.0)
MCHC: 30.2 g/dL (ref 30.0–36.0)
MCV: 101.1 fL — AB (ref 78.0–100.0)
PLATELETS: 205 10*3/uL (ref 150–400)
RBC: 2.82 MIL/uL — ABNORMAL LOW (ref 3.87–5.11)
RDW: 16.1 % — AB (ref 11.5–15.5)
WBC: 9.9 10*3/uL (ref 4.0–10.5)

## 2015-08-14 LAB — BASIC METABOLIC PANEL
ANION GAP: 13 (ref 5–15)
BUN: 33 mg/dL — ABNORMAL HIGH (ref 6–20)
CALCIUM: 8.8 mg/dL — AB (ref 8.9–10.3)
CO2: 23 mmol/L (ref 22–32)
Chloride: 97 mmol/L — ABNORMAL LOW (ref 101–111)
Creatinine, Ser: 7.16 mg/dL — ABNORMAL HIGH (ref 0.44–1.00)
GFR, EST AFRICAN AMERICAN: 6 mL/min — AB (ref >60)
GFR, EST NON AFRICAN AMERICAN: 5 mL/min — AB (ref >60)
GLUCOSE: 66 mg/dL (ref 65–99)
POTASSIUM: 5.5 mmol/L — AB (ref 3.5–5.1)
SODIUM: 133 mmol/L — AB (ref 135–145)

## 2015-08-14 LAB — GLUCOSE, CAPILLARY
GLUCOSE-CAPILLARY: 87 mg/dL (ref 65–99)
Glucose-Capillary: 72 mg/dL (ref 65–99)
Glucose-Capillary: 72 mg/dL (ref 65–99)
Glucose-Capillary: 72 mg/dL (ref 65–99)
Glucose-Capillary: 97 mg/dL (ref 65–99)

## 2015-08-14 MED ORDER — VANCOMYCIN HCL IN DEXTROSE 750-5 MG/150ML-% IV SOLN
750.0000 mg | INTRAVENOUS | Status: DC
Start: 2015-08-21 — End: 2015-08-14

## 2015-08-14 MED ORDER — VANCOMYCIN HCL IN DEXTROSE 750-5 MG/150ML-% IV SOLN
INTRAVENOUS | Status: AC
  Filled 2015-08-14: qty 150

## 2015-08-14 MED ORDER — VANCOMYCIN HCL IN DEXTROSE 750-5 MG/150ML-% IV SOLN: 750.0000 mg | INTRAVENOUS | Status: DC

## 2015-08-14 MED ORDER — VITAMIN D (ERGOCALCIFEROL) 1.25 MG (50000 UNIT) PO CAPS: 50000.0000 [IU] | ORAL_CAPSULE | ORAL | Status: DC

## 2015-08-14 MED ORDER — DEXTROSE 5 % IV SOLN
1.0000 g | Freq: Once | INTRAVENOUS | Status: AC
  Administered 2015-08-14: 1 g via INTRAVENOUS
  Filled 2015-08-14 (×2): qty 1

## 2015-08-14 MED ORDER — DEXTROSE 5 % IV SOLN
2.0000 g | INTRAVENOUS | Status: DC
  Filled 2015-08-14: qty 2

## 2015-08-14 NOTE — Progress Notes (Signed)
PROGRESS NOTE    Nicole Cox  ZOX:[id number] DOB: 24-Mar-1931 DOA: 08/13/2015 PCP: Joana Reamer, MD   Brief Narrative:  On 08/13/2015 by Dr. Midge Minium Shaughnessy [name] is a 82 y.o. female with medical history significant of ESRD on hemodialysis on Tuesday Thursday and Saturday, hypertension, COPD, CAD, chronic anemia presents to the ER because of increasing shortness of breath. Patient states she has been having shortness of breath off and on for the last 1 week. Patient was admitted in the beginning of the last month at [facility] for pneumonia. Chest x-ray today shows right-sided infiltrates concerning for pneumonia. Patient states she also has been having off-and-on pleuritic-type of chest pain when she coughs the last time she had it was 3 days ago. Patient states she did not miss her dialysis. Patient's blood pressure is also found to be elevated with systolic more than 180. Patient is being admitted for acute respiratory failure probably from pneumonia and also some contribution from fluid overload.  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 -Continue to monitor CBC  COPD -Continue nebulizers, no active wheezing noted  Diabetes mellitus, type II -Continue Lantus, insulin sliding scale and CBG monitoring  DVT Prophylaxis  heparin  Code Status: Full  Family Communication: None at bedside  Disposition Plan: Admitted, pending HD  today  Consultants Nephrology  Procedures  None  Antibiotics   Anti-infectives    Start     Dose/Rate Route Frequency Ordered Stop   08/14/15 0000  vancomycin (VANCOCIN) IVPB 750 mg/150 ml premix  Status:  Discontinued     750 mg 150 mL/hr over 60 Minutes Intravenous Every T-Th-Sa (Hemodialysis) 08/13/15 0359 08/13/15 0930   08/14/15 0000  ceFEPIme (MAXIPIME) 2 g in dextrose 5 % 50 mL IVPB  Status:  Discontinued     2 g 100 mL/hr over 30 Minutes Intravenous Every T-Th-Sa (Hemodialysis) 08/13/15 0609 08/13/15 1407   08/13/15 1415  ceFEPIme (MAXIPIME) 2 g in dextrose 5 % 50 mL IVPB     2 g 100 mL/hr over 30 Minutes Intravenous Every T-Th-Sa (Hemodialysis) 08/13/15 1407     08/13/15 0930  vancomycin (VANCOCIN) IVPB 750 mg/150 ml premix     750 mg 150 mL/hr over 60 Minutes Intravenous Every T-Th-Sa (Hemodialysis) 08/13/15 0930     08/13/15 0600  ceFEPIme (MAXIPIME) 1 g in dextrose 5 % 50 mL IVPB  Status:  Discontinued     1 g 100 mL/hr over 30 Minutes Intravenous Every 8 hours 08/13/15 0559 08/13/15 0609   08/13/15 0400  ceFEPIme (MAXIPIME) 2 g in dextrose 5 % 50 mL IVPB     2 g 100 mL/hr over 30 Minutes Intravenous  Once 08/13/15 0346 08/13/15 0444   08/13/15 0400  vancomycin (VANCOCIN) 1,500 mg in sodium chloride 0.9 % 500 mL IVPB     1,500 mg 250 mL/hr over 120 Minutes Intravenous  Once 08/13/15 0357 08/13/15 0715   08/13/15 0345  ceFEPIme (MAXIPIME) 1 g in dextrose 5 % 50 mL IVPB  Status:  Discontinued     1 g  100 mL/hr over 30 Minutes Intravenous  Once 08/13/15 0343 08/13/15 0346      Subjective:   [name] seen and examined today. He has no complaints today. States she's feeling mildly better. Denies chest pain, shortness breath, abdominal pain, nausea or vomiting, dizziness or headache.  Objective:   Filed Vitals:   08/14/15 0000 08/14/15 0400 08/14/15 0507 08/14/15 0749  BP: 145/50 133/51  157/50  Pulse: 91 75  79  Temp:   98.3 F (36.8 C) 99.8 F (37.7 C)   TempSrc:   Oral Oral  Resp: 19 18  21   Height:      Weight:      SpO2: 97% 98%  96%    Intake/Output Summary (Last 24 hours) at 08/14/15 1044 Last data filed at 08/14/15 0114  Gross per 24 hour  Intake     50 ml  Output   1791 ml  Net  -1741 ml   Filed Weights   08/13/15 0351 08/13/15 1353  Weight: 71.305 kg (157 lb 3.2 oz) 66.7 kg (147 lb 0.8 oz)    Exam  General: Well developed, well nourished, NAD, appears stated age  HEENT: NCAT,  mucous membranes moist.   Cardiovascular: S1 S2 auscultated, no rubs, murmurs or gallops. Regular rate and rhythm.  Respiratory: Clear to auscultation   Abdomen: Soft, nontender, nondistended, + bowel sounds  Extremities: warm dry without cyanosis clubbing or edema  Neuro: AAOx3, nonfocal  Psych: Normal affect and demeanor   Data Reviewed: I have personally reviewed following labs and imaging studies  CBC:  Recent Labs Lab 08/13/15 0240 08/13/15 0908 08/14/15 0803  WBC 10.0 10.9* 9.9  NEUTROABS 6.1 8.2*  --   HGB 9.6* 9.3* 8.6*  HCT 31.5* 30.5* 28.5*  MCV 101.0* 100.0 101.1*  PLT 279 252 205   Basic Metabolic Panel:  Recent Labs Lab 08/13/15 0240 08/13/15 0908 08/14/15 0803  NA 139 140 133*  K 5.2* 6.9* 5.5*  CL 102 106 97*  CO2 23 21* 23  GLUCOSE 113* 109* 66  BUN 63* 67* 33*  CREATININE 9.89* 10.52* 7.16*  CALCIUM 8.9 8.5* 8.8*   GFR: Estimated Creatinine Clearance: 5.7 mL/min (by C-G formula based on Cr of 7.16). Liver Function Tests:  Recent Labs Lab 08/13/15 0240 08/13/15 0908  AST 29 24  ALT 26 24  ALKPHOS 148* 120  BILITOT 0.7 0.7  PROT 7.3 7.1  ALBUMIN 3.4* 3.2*   No results for input(s): LIPASE, AMYLASE in the last 168 hours. No results for input(s): AMMONIA in the last 168 hours. Coagulation Profile: No results for input(s): INR, PROTIME in the last 168 hours. Cardiac Enzymes:  Recent Labs Lab 08/13/15 0240 08/13/15 0908  TROPONINI 0.05* 0.06*   BNP (last 3 results) No results  for input(s): PROBNP in the last 8760 hours. HbA1C: No results for input(s): HGBA1C in the last 72 hours. CBG:  Recent Labs Lab 08/13/15 1110 08/13/15 1732 08/13/15 2112 08/14/15 0648 08/14/15 0814  GLUCAP 104* 94 89 72 72   Lipid Profile: No results for input(s): CHOL, HDL, LDLCALC, TRIG, CHOLHDL, LDLDIRECT in the last 72 hours. Thyroid Function Tests: No results for input(s): TSH, T4TOTAL, FREET4, T3FREE, THYROIDAB in the last 72 hours. Anemia Panel: No results for input(s): VITAMINB12, FOLATE, FERRITIN, TIBC, IRON, RETICCTPCT in the last 72 hours. Urine analysis:    Component Value Date/Time   COLORURINE YELLOW 08/11/2012 1917   APPEARANCEUR CLOUDY* 08/11/2012 1917   LABSPEC 1.013 08/11/2012 1917   PHURINE 7.5  08/11/2012 1917   GLUCOSEU 100* 08/11/2012 1917   HGBUR TRACE* 08/11/2012 1917   BILIRUBINUR NEGATIVE 08/11/2012 1917   KETONESUR NEGATIVE 08/11/2012 1917   PROTEINUR >300* 08/11/2012 1917   UROBILINOGEN 0.2 08/11/2012 1917   NITRITE NEGATIVE 08/11/2012 1917   LEUKOCYTESUR NEGATIVE 08/11/2012 1917   Sepsis Labs: (procalcitonin:4,lacticidven:4)  ) Recent Results (from the past 240 hour(s))  MRSA PCR Screening     Status: None   Collection Time: 08/13/15  2:23 PM  Result Value Ref Range Status   MRSA by PCR NEGATIVE NEGATIVE Final    Comment:        The GeneXpert MRSA Assay (FDA approved for NASAL specimens only), is one component of a comprehensive MRSA colonization surveillance program. It is not intended to diagnose MRSA infection nor to guide or monitor treatment for MRSA infections.       Radiology Studies: Dg Chest 2 View  08/13/2015  CLINICAL DATA:  Acute onset of cough and shortness of breath. Initial encounter. EXAM: CHEST  2 VIEW COMPARISON:  Chest radiograph performed 08/14/2012 FINDINGS: The lungs are well-aerated. Diffuse right-sided airspace opacification raises concern for pneumonia. Asymmetric interstitial edema might have  a similar appearance. Underlying vascular congestion is noted. There is no evidence of pleural effusion or pneumothorax. The heart is mildly enlarged. No acute osseous abnormalities are seen. IMPRESSION: 1. Diffuse right-sided airspace opacification raises concern for pneumonia. Asymmetric interstitial edema might have a similar appearance. 2. Underlying vascular congestion and mild cardiomegaly. Electronically Signed   By: Roanna Raider M.D.   On: 08/13/2015 02:53     Scheduled Meds: . amLODipine  5 mg Oral Daily  . calcium acetate  1,334 mg Oral QAC supper  . ceFEPime (MAXIPIME) IV  2 g Intravenous Q T,Th,Sa-HD  . cloNIDine  0.2 mg Oral BID  . gabapentin  100 mg Oral QHS  . heparin  5,000 Units Subcutaneous Q8H  . insulin aspart  0-9 Units Subcutaneous TID WC  . insulin aspart  6 Units Subcutaneous TID WC  . insulin glargine  16 Units Subcutaneous QHS  . ipratropium-albuterol  3 mL Nebulization BID  . pravastatin  40 mg Oral Daily  . sodium chloride flush  3 mL Intravenous Q12H  . vancomycin  750 mg Intravenous Q T,Th,Sa-HD  . Vitamin D (Ergocalciferol)  50,000 Units Oral Daily   Continuous Infusions:    LOS: 1 day   Time Spent in minutes   45 minutes  Keiaira Donlan D.O. on 08/14/2015 at 10:44 AM  Between 7am to 7pm - Pager - [phone]  After 7pm go to [email or web address] - password [email or web address]  And look for the night coverage person covering for me after hours  [facility] Office  [phone]

## 2015-08-14 NOTE — Progress Notes (Signed)
  [facility] Progress Note   Subjective: feeling better, no c/o's, poor historian  Filed Vitals:   08/14/15 0000 08/14/15 0400 08/14/15 0507 08/14/15 0749  BP: 145/50 133/51  157/50  Pulse: 91 75  79  Temp:   98.3 F (36.8 C) 99.8 F (37.7 C)  TempSrc:   Oral Oral  Resp: 19 18  21   Height:      Weight:      SpO2: 97% 98%  96%    Inpatient medications: . amLODipine  5 mg Oral Daily  . calcium acetate  1,334 mg Oral QAC supper  . ceFEPime (MAXIPIME) IV  2 g Intravenous Q T,Th,Sa-HD  . cloNIDine  0.2 mg Oral BID  . gabapentin  100 mg Oral QHS  . heparin  5,000 Units Subcutaneous Q8H  . insulin aspart  0-9 Units Subcutaneous TID WC  . insulin aspart  6 Units Subcutaneous TID WC  . insulin glargine  16 Units Subcutaneous QHS  . ipratropium-albuterol  3 mL Nebulization BID  . pravastatin  40 mg Oral Daily  . sodium chloride flush  3 mL Intravenous Q12H  . vancomycin  750 mg Intravenous Q T,Th,Sa-HD  . Vitamin D (Ergocalciferol)  50,000 Units Oral Daily     acetaminophen **OR** acetaminophen, camphor-menthol **AND** hydrOXYzine, feeding supplement (NEPRO CARB STEADY), hydrALAZINE, ipratropium-albuterol, ondansetron **OR** ondansetron (ZOFRAN) IV, oxyCODONE-acetaminophen, traMADol  Exam: Alert, no distress No jvd or bruits Chest clear bilat to bases RRR no MRG Abd soft ntnd no mass or ascites +bs GU deferred MS no joint effusions or deformity Ext no LE or UE edema / no wounds or ulcers Neuro is alert, Ox 3 , nf  CXR 5/30 - diffuse R-sided infiltrates, edema vs PNA  Dialysis: TTS [place] on [address] 3h  66.5kg (146lb)   3K/2.0 bath  LUA AVG  Hep 3900 then 500/hr   Assessment: 1. Acute resp failure/ hypoxemia - pulm edema +/- PNA.  Better after HD yest. At dry wt today but likely needs lower dry wt 2. Hx COPD home O2 3. HTN norvasc/ clonidine 4. DM on insulin 5. Hyperkalemia- better  Plan - HD today and tomorrow, get vol down further . Repeat  CXR after HD today   Vinson Moselleob Grigor Lipschutz MD [facility] pager [phone]    cell [phone] 08/14/2015, 9:45 AM    Recent Labs Lab 08/13/15 0240 08/13/15 0908 08/14/15 0803  NA 139 140 133*  K 5.2* 6.9* 5.5*  CL 102 106 97*  CO2 23 21* 23  GLUCOSE 113* 109* 66  BUN 63* 67* 33*  CREATININE 9.89* 10.52* 7.16*  CALCIUM 8.9 8.5* 8.8*    Recent Labs Lab 08/13/15 0240 08/13/15 0908  AST 29 24  ALT 26 24  ALKPHOS 148* 120  BILITOT 0.7 0.7  PROT 7.3 7.1  ALBUMIN 3.4* 3.2*    Recent Labs Lab 08/13/15 0240 08/13/15 0908 08/14/15 0803  WBC 10.0 10.9* 9.9  NEUTROABS 6.1 8.2*  --   HGB 9.6* 9.3* 8.6*  HCT 31.5* 30.5* 28.5*  MCV 101.0* 100.0 101.1*  PLT 279 252 205

## 2015-08-14 NOTE — Clinical Documentation Improvement (Addendum)
Hospitalist and/or Consultants  Please document query responses in the progress notes and discharge summary, not on the CDI BPA in [facility]. Please do not deactivate queries without responding to them. Thank you.  "CHF" and "Heart Disease" are documented in the current medical record - in the ED provider note x 2 dated 08/13/2015;  In the H&P under the past medical history section; and in the past medical history section of the renal consult note.     If know or able to determine, please document the Acuity and Type of the patient's CHF monitored and treated this admission, including any associated causes and/or conditions.   Please exercise your independent, professional judgment when responding. A specific answer is not anticipated or expected.   Thank You, Jerral Ralphathy R Aspen Lawrance  RN BSN CCDS [phone] Health Information Management [place]

## 2015-08-15 LAB — BASIC METABOLIC PANEL
Anion gap: 10 (ref 5–15)
BUN: 17 mg/dL (ref 6–20)
CALCIUM: 9 mg/dL (ref 8.9–10.3)
CO2: 30 mmol/L (ref 22–32)
CREATININE: 5.02 mg/dL — AB (ref 0.44–1.00)
Chloride: 93 mmol/L — ABNORMAL LOW (ref 101–111)
GFR calc Af Amer: 8 mL/min — ABNORMAL LOW (ref >60)
GFR, EST NON AFRICAN AMERICAN: 7 mL/min — AB (ref >60)
Glucose, Bld: 56 mg/dL — ABNORMAL LOW (ref 65–99)
POTASSIUM: 4.6 mmol/L (ref 3.5–5.1)
SODIUM: 133 mmol/L — AB (ref 135–145)

## 2015-08-15 LAB — GLUCOSE, CAPILLARY
GLUCOSE-CAPILLARY: 74 mg/dL (ref 65–99)
Glucose-Capillary: 143 mg/dL — ABNORMAL HIGH (ref 65–99)
Glucose-Capillary: 115 mg/dL — ABNORMAL HIGH (ref 65–99)

## 2015-08-15 LAB — CBC
HCT: 29.6 % — ABNORMAL LOW (ref 36.0–46.0)
HEMOGLOBIN: 8.9 g/dL — AB (ref 12.0–15.0)
MCH: 29.8 pg (ref 26.0–34.0)
MCHC: 30.1 g/dL (ref 30.0–36.0)
MCV: 99 fL (ref 78.0–100.0)
PLATELETS: 185 10*3/uL (ref 150–400)
RBC: 2.99 MIL/uL — ABNORMAL LOW (ref 3.87–5.11)
RDW: 15.1 % (ref 11.5–15.5)
WBC: 8.2 10*3/uL (ref 4.0–10.5)

## 2015-08-15 MED ORDER — SODIUM CHLORIDE 0.9 % IV SOLN: 100.0000 mL | INTRAVENOUS | Status: DC | PRN

## 2015-08-15 MED ORDER — CETYLPYRIDINIUM CHLORIDE 0.05 % MT LIQD
7.0000 mL | Freq: Two times a day (BID) | OROMUCOSAL | Status: DC
  Administered 2015-08-15 – 2015-08-17 (×4): 7 mL via OROMUCOSAL

## 2015-08-15 MED ORDER — VANCOMYCIN HCL IN DEXTROSE 750-5 MG/150ML-% IV SOLN
INTRAVENOUS | Status: AC
  Filled 2015-08-15: qty 150

## 2015-08-15 MED ORDER — PENTAFLUOROPROP-TETRAFLUOROETH EX AERO: 1.0000 "application " | INHALATION_SPRAY | CUTANEOUS | Status: DC | PRN

## 2015-08-15 MED ORDER — HEPARIN SODIUM (PORCINE) 1000 UNIT/ML DIALYSIS: 1000.0000 [IU] | INTRAMUSCULAR | Status: DC | PRN

## 2015-08-15 MED ORDER — ALTEPLASE 2 MG IJ SOLR: 2.0000 mg | Freq: Once | INTRAMUSCULAR | Status: DC | PRN

## 2015-08-15 MED ORDER — LIDOCAINE HCL (PF) 1 % IJ SOLN: 5.0000 mL | INTRAMUSCULAR | Status: DC | PRN

## 2015-08-15 MED ORDER — LIDOCAINE-PRILOCAINE 2.5-2.5 % EX CREA: 1.0000 "application " | TOPICAL_CREAM | CUTANEOUS | Status: DC | PRN

## 2015-08-15 MED ORDER — HEPARIN SODIUM (PORCINE) 1000 UNIT/ML DIALYSIS: 3000.0000 [IU] | INTRAMUSCULAR | Status: DC | PRN

## 2015-08-15 NOTE — Evaluation (Signed)
Occupational Therapy Evaluation Patient Details Name: Nicole DecantLizzie Cox MRN: [id number] DOB: 08-Aug-1931 Today's Date: 08/15/2015    History of Present Illness Nicole Cox is a 82 y.o. female with medical history significant of ESRD on hemodialysis on Tuesday Thursday and Saturday, hypertension, COPD, CAD, chronic anemia presents to the ER because of increasing shortness of breath. Patient states she has been having shortness of breath off and on for the last 1 week.   Clinical Impression   Patient presenting with decreased ADL and functional mobility independence secondary to above. Patient mod I PTA. Patient currently functioning at an overall total assist level for ADL secondary to increased fatigue, decreased endurance, decreased cognition, and increased shakiness (ataxia??). Patient will benefit from acute OT to increase overall independence in the areas of ADLs, functional mobility, and overall safety in order to safely discharge home with 24/7 supervision/assistance and HHOT. Patient's vitals WNL during OT eval/treat, including BP, HR, 02 sats, and RR.     Follow Up Recommendations  Home health OT;Supervision/Assistance - 24 hour (IF 24/7 supervision/assistance not available, recommending SNF)    Equipment Recommendations  3 in 1 bedside comode    Recommendations for Other Services  None at this time    Precautions / Restrictions Precautions Precautions: Fall Restrictions Weight Bearing Restrictions: No     Mobility Bed Mobility Overal bed mobility: Needs Assistance Bed Mobility: Rolling;Supine to Sit Rolling: Mod assist   Supine to sit: Mod assist     General bed mobility comments: Assistance for management of BLEs and trunk control into sitting EOB. Increased time and cues needed for bed mobility. Pt with increased shakiness when attempting to sit EOB.   Transfers Overall transfer level: Needs assistance Equipment used: None Transfers: Sit to/from Frontier Oil CorporationStand;Stand Pivot  Transfers Sit to Stand: Mod assist Stand pivot transfers: Max assist       General transfer comment: Multimodal cueing needed for technique and sequencing.     Balance Overall balance assessment: Needs assistance Sitting-balance support: No upper extremity supported;Feet supported Sitting balance-Leahy Scale: Poor   Postural control: Posterior lean Standing balance support: No upper extremity supported Standing balance-Leahy Scale: Poor    ADL Overall ADL's : Needs assistance/impaired General ADL Comments: Currently, pt overall total assist for ADL due to increased shakiness, increased fatigue/weakness, and decreased cognition. Patient's granddaughter present during this eval and states that everytime patient comes in the hospital she becomes confused and has some slurring of speech.     Vision Vision Assessment?: No apparent visual deficits          Pertinent Vitals/Pain Pain Assessment: No/denies pain     Hand Dominance Right   Extremity/Trunk Assessment Upper Extremity Assessment Upper Extremity Assessment: Generalized weakness   Lower Extremity Assessment Lower Extremity Assessment: Defer to PT evaluation   Cervical / Trunk Assessment Cervical / Trunk Assessment: Kyphotic   Communication Communication Communication: Expressive difficulties   Cognition Arousal/Alertness: Lethargic Behavior During Therapy: WFL for tasks assessed/performed Overall Cognitive Status: Impaired/Different from baseline Area of Impairment: Orientation;Attention;Memory;Following commands;Safety/judgement;Awareness;Problem solving Orientation Level: Disoriented to;Time;Situation;Place Current Attention Level: Sustained Memory: Decreased recall of precautions Following Commands: Follows one step commands inconsistently Safety/Judgement: Decreased awareness of safety;Decreased awareness of deficits Awareness: Intellectual Problem Solving: Slow processing;Decreased initiation;Difficulty  sequencing;Requires verbal cues;Requires tactile cues                Home Living Family/patient expects to be discharged to:: Private residence Living Arrangements: Children Available Help at Discharge: Family;Available 24 hours/day Type of Home: House Home Access: Stairs  to enter Entrance Stairs-Number of Steps: ~2 per granddaughter report   Home Layout: One level     Bathroom Shower/Tub: Chief Strategy Officer: Standard     Home Equipment: Environmental consultant - 4 wheels;Cane - single point   Prior Functioning/Environment Level of Independence: Independent with assistive device(s)     OT Diagnosis: Generalized weakness;Cognitive deficits;Ataxia   OT Problem List: Decreased strength;Decreased activity tolerance;Impaired balance (sitting and/or standing);Decreased coordination;Decreased safety awareness;Decreased knowledge of use of DME or AE;Decreased knowledge of precautions   OT Treatment/Interventions: Self-care/ADL training;Therapeutic exercise;Energy conservation;DME and/or AE instruction;Therapeutic activities;Patient/family education;Balance training    OT Goals(Current goals can be found in the care plan section) Acute Rehab OT Goals Patient Stated Goal: go home OT Goal Formulation: With patient/family Time For Goal Achievement: 08/29/15 Potential to Achieve Goals: Good ADL Goals Pt Will Perform Grooming: with supervision;standing Pt Will Perform Upper Body Bathing: with supervision;sitting Pt Will Perform Lower Body Bathing: with supervision;sit to/from stand Pt Will Perform Upper Body Dressing: with supervision;sitting Pt Will Perform Lower Body Dressing: with supervision;sit to/from stand Pt Will Transfer to Toilet: with supervision;ambulating;bedside commode  OT Frequency: Min 2X/week   Barriers to D/C:    None known at this time. According to granddaughter pt will have 24/7 assistance at home.    End of Session Equipment Utilized During Treatment: Gait  belt;Oxygen Nurse Communication: Mobility status;Other (comment) (need for 24/7 post acute d/c)  Activity Tolerance: Patient limited by fatigue;Patient limited by lethargy Patient left: in chair;with call bell/phone within reach;with family/visitor present   Time: 1478-2956 OT Time Calculation (min): 23 min Charges:  OT General Charges $OT Visit: 1 Procedure OT Evaluation $OT Eval Moderate Complexity: 1 Procedure OT Treatments $Self Care/Home Management : 8-22 mins  Edwin Cap , MS, OTR/L, CLT  08/15/2015, 2:06 PM

## 2015-08-15 NOTE — Progress Notes (Signed)
Pharmacy Antibiotic Note Nicole Cox is a 82 y.o. female admitted on 08/13/2015 with acute respiratory failure/hypoxemia in setting of ESRD. Currently on day 3 of Cefepime and vancomycin for coverage of PNA. Has received IHD on 5/30, 5/31 and plans to resume T/TH/SAT schedule starting today (6/1).    Plan: 1. Vanc 750 mg/HD-TTS  2. Cefepime 2g on TTS @ 1800 3. If vancomycin continued will obtain pre-HD level after 2 - 3 doses; goal pre- HD 15-25 4. F/u pt response, repeat x-ray and narrow or scale back abs as feasible   Height: 5\' 3"  (160 cm) Weight: 141 lb 8.6 oz (64.2 kg) (in bed) IBW/kg (Calculated) : 52.4  Temp (24hrs), Avg:98.6 F (37 C), Min:98 F (36.7 C), Max:99.6 F (37.6 C)   Recent Labs Lab 08/13/15 0240 08/13/15 0402 08/13/15 0908 08/14/15 0803 08/15/15 0530  WBC 10.0  --  10.9* 9.9 8.2  CREATININE 9.89*  --  10.52* 7.16* 5.02*  LATICACIDVEN  --  1.37  --   --   --     Estimated Creatinine Clearance: 7.9 mL/min (by C-G formula based on Cr of 5.02).    No Known Allergies  Antimicrobials this admission: Cefepime 5/30 >>  Vanc 5/30 >>   Dose adjustments this admission: n/a  Microbiology results: 5/30 BCx: ngtd  5/30MRSA PCR: negative     Nicole SamplesAndy Raegen Cox, PharmD, BCPS 08/15/2015, 9:43 AM Pager: [phone]

## 2015-08-15 NOTE — Progress Notes (Signed)
PT Cancellation Note  Patient Details Name: Nicole Cox MRN: [id number] DOB: 1932/01/28   CAl Decantancelled Treatment:    Reason Eval/Treat Not Completed: Patient at procedure or test/unavailable (Pt in hemodialysis. Will check back as able. Thanks. )   Cliffton AstersWhite, Josten Warmuth F 08/15/2015, 8:48 AM Eber Jonesawn Charizma Gardiner,PT Acute Rehabilitation [phone] (pager)

## 2015-08-15 NOTE — Progress Notes (Signed)
  [facility] Progress Note   Subjective: confused, poor historian  Filed Vitals:   08/15/15 0730 08/15/15 0800 08/15/15 0830 08/15/15 0900  BP: 136/60 134/60 147/59 117/49  Pulse: 70 69 80 84  Temp:      TempSrc:      Resp: 16 15 20 20   Height:      Weight:      SpO2: 100% 100% 100% 100%    Inpatient medications: . amLODipine  5 mg Oral Daily  . calcium acetate  1,334 mg Oral QAC supper  . ceFEPime (MAXIPIME) IV  2 g Intravenous Q T,Th,Sat-1800  . cloNIDine  0.2 mg Oral BID  . gabapentin  100 mg Oral QHS  . heparin  5,000 Units Subcutaneous Q8H  . insulin aspart  0-9 Units Subcutaneous TID WC  . insulin aspart  6 Units Subcutaneous TID WC  . insulin glargine  16 Units Subcutaneous QHS  . ipratropium-albuterol  3 mL Nebulization BID  . pravastatin  40 mg Oral Daily  . sodium chloride flush  3 mL Intravenous Q12H  . vancomycin  750 mg Intravenous Q T,Th,Sa-HD  . [START ON 08/21/2015] Vitamin D (Ergocalciferol)  50,000 Units Oral Q Wed     sodium chloride, sodium chloride, acetaminophen **OR** acetaminophen, alteplase, camphor-menthol **AND** hydrOXYzine, feeding supplement (NEPRO CARB STEADY), heparin, [START ON 08/16/2015] heparin, hydrALAZINE, ipratropium-albuterol, lidocaine (PF), lidocaine-prilocaine, ondansetron **OR** ondansetron (ZOFRAN) IV, oxyCODONE-acetaminophen, pentafluoroprop-tetrafluoroeth, traMADol  Exam: Awake, no distress, repeating the words "I'm dying" No jvd or bruits Chest rales L base o/w clear RRR no MRG Abd soft ntnd no mass or ascites +bs Ext no LE edema Neuro is alert, Ox 3 , nf  CXR 5/30 - diffuse R-sided infiltrates, edema vs PNA CXR 5/31 - resolution of R dense infiltrates, residual IS edema mild-mod  Dialysis: TTS [facility] on [address] 3h  66.5kg (146lb)   3K/2.0 bath  LUA AVG  Hep 3900 then 500/hr   Assessment: 1. Acute resp failure/ hypoxemia/ pulm infiltrates - improving CXR w HD, still wet though. HD again today, max  UF as tolerated.  2. Hx COPD home O2 3. HTN norvasc/ clonidine, BP"s soft 4. DM on insulin 5. Hyperkalemia- better 6. AMS - not getting any sedating meds/ narcotics, not sure cause.    Plan - stop BP meds, max UF w HD today.  Abx can be stopped.    Vinson Moselleob Lateia Fraser MD [facility] pager [phone]    cell [phone] 08/15/2015, 9:26 AM    Recent Labs Lab 08/13/15 0908 08/14/15 0803 08/15/15 0530  NA 140 133* 133*  K 6.9* 5.5* 4.6  CL 106 97* 93*  CO2 21* 23 30  GLUCOSE 109* 66 56*  BUN 67* 33* 17  CREATININE 10.52* 7.16* 5.02*  CALCIUM 8.5* 8.8* 9.0    Recent Labs Lab 08/13/15 0240 08/13/15 0908  AST 29 24  ALT 26 24  ALKPHOS 148* 120  BILITOT 0.7 0.7  PROT 7.3 7.1  ALBUMIN 3.4* 3.2*    Recent Labs Lab 08/13/15 0240 08/13/15 0908 08/14/15 0803 08/15/15 0530  WBC 10.0 10.9* 9.9 8.2  NEUTROABS 6.1 8.2*  --   --   HGB 9.6* 9.3* 8.6* 8.9*  HCT 31.5* 30.5* 28.5* 29.6*  MCV 101.0* 100.0 101.1* 99.0  PLT 279 252 205 185

## 2015-08-15 NOTE — Progress Notes (Signed)
PROGRESS NOTE    Al DecantLizzie Jutte  ZOX:[id number] DOB: 1932/03/13 DOA: 08/13/2015 PCP: [name],J STEWART, MD   Brief Narrative:  HPI On 08/13/2015 by Dr. Midge MiniumArshad Cox Al DecantLizzie Nicole Cox is a 82 y.o. female with medical history significant of ESRD on hemodialysis on Tuesday Thursday and Saturday, hypertension, COPD, CAD, chronic anemia presents to the ER because of increasing shortness of breath. Patient states she has been having shortness of breath off and on for the last 1 week. Patient was [facility] in the beginning of the last month [facility]. Chest x-ray today shows right-sided infiltrates concerning for pneumonia. Patient states she also has been having off-and-on pleuritic-type of chest pain when she coughs the last time she had it was 3 days ago. Patient states she did not miss her dialysis. Patient's blood pressure is also found to be elevated with systolic more than 180. Patient is being admitted for acute respiratory failure probably from pneumonia and also some contribution from fluid overload.  Assessment & Plan   Acute respiratory failure with hypoxia/Possible HCAP -Patient did require BiPAP upon admission -Currently on vancomycin and cefepime -Continue nebulizers -Repeat chest x-ray 5/31: B/L diffuse interstitial densities- concerning for edema or possibly inflammation.  -Will discontinue antibiotics today as this is likely volume related instead of infection.  Currently WBC trending downward, afebrile.   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 -Continue to monitor CBC -hemoglobin 8.9 today  COPD -Continue nebulizers, no active wheezing noted  Diabetes  mellitus, type II -Continue insulin sliding scale and CBG monitoring -Will discontinue lantus and premeal insulin as blood sugars have been low  DVT Prophylaxis  heparin  Code Status: Full  Family Communication: None at bedside  Disposition Plan: Admitted, pending improvement  Consultants Nephrology  Procedures  None  Antibiotics   Anti-infectives    Start     Dose/Rate Route Frequency Ordered Stop   08/21/15 1200  vancomycin (VANCOCIN) IVPB 750 mg/150 ml premix  Status:  Discontinued     750 mg 150 mL/hr over 60 Minutes Intravenous Every Wed (Hemodialysis) 08/14/15 1349 08/14/15 1428   08/15/15 1800  ceFEPIme (MAXIPIME) 2 g in dextrose 5 % 50 mL IVPB     2 g 100 mL/hr over 30 Minutes Intravenous Every T-Th-Sa (1800) 08/14/15 1350     08/15/15 0904  Vancomycin (VANCOCIN) 750-5 MG/150ML-% IVPB    Comments:  Carlyon ProwsZhao, Xiaobo   : cabinet override      08/15/15 0904 08/15/15 0916   08/14/15 2000  ceFEPIme (MAXIPIME) 1 g in dextrose 5 % 50 mL IVPB     1 g 100 mL/hr over 30 Minutes Intravenous  Once 08/14/15 1350 08/14/15 2200   08/14/15 1509  Vancomycin (VANCOCIN) 750-5 MG/150ML-% IVPB    Comments:  Magodo, Dain   : cabinet override      08/14/15 1509 08/15/15 0314   08/14/15 1430  vancomycin (VANCOCIN) IVPB 750 mg/150 ml premix  Status:  Discontinued     750 mg 150 mL/hr over 60 Minutes Intravenous Every Wed (Hemodialysis) 08/14/15 1428 08/15/15 0831   08/14/15 0000  vancomycin (VANCOCIN) IVPB 750 mg/150 ml premix  Status:  Discontinued     750 mg 150 mL/hr over 60 Minutes Intravenous Every T-Th-Sa (Hemodialysis)  08/13/15 0359 08/13/15 0930   08/14/15 0000  ceFEPIme (MAXIPIME) 2 g in dextrose 5 % 50 mL IVPB  Status:  Discontinued     2 g 100 mL/hr over 30 Minutes Intravenous Every T-Th-Sa (Hemodialysis) 08/13/15 0609 08/13/15 1407   08/13/15 1415  ceFEPIme (MAXIPIME) 2 g in dextrose 5 % 50 mL IVPB  Status:  Discontinued     2 g 100 mL/hr over 30 Minutes Intravenous Every  T-Th-Sa (Hemodialysis) 08/13/15 1407 08/14/15 1350   08/13/15 0930  vancomycin (VANCOCIN) IVPB 750 mg/150 ml premix     750 mg 150 mL/hr over 60 Minutes Intravenous Every T-Th-Sa (Hemodialysis) 08/13/15 0930     08/13/15 0600  ceFEPIme (MAXIPIME) 1 g in dextrose 5 % 50 mL IVPB  Status:  Discontinued     1 g 100 mL/hr over 30 Minutes Intravenous Every 8 hours 08/13/15 0559 08/13/15 0609   08/13/15 0400  ceFEPIme (MAXIPIME) 2 g in dextrose 5 % 50 mL IVPB     2 g 100 mL/hr over 30 Minutes Intravenous  Once 08/13/15 0346 08/13/15 0444   08/13/15 0400  vancomycin (VANCOCIN) 1,500 mg in sodium chloride 0.9 % 500 mL IVPB     1,500 mg 250 mL/hr over 120 Minutes Intravenous  Once 08/13/15 0357 08/13/15 0715   08/13/15 0345  ceFEPIme (MAXIPIME) 1 g in dextrose 5 % 50 mL IVPB  Status:  Discontinued     1 g 100 mL/hr over 30 Minutes Intravenous  Once 08/13/15 0343 08/13/15 0346      Subjective:   [name] seen and examined today in hemodialysis. She has no complaints today.  Seems confused today. Denies chest pain, shortness breath, abdominal pain, nausea or vomiting, dizziness or headache.  Objective:   Filed Vitals:   08/15/15 0900 08/15/15 0930 08/15/15 1000 08/15/15 1005  BP: 117/49 115/53 90/44 117/54  Pulse: 84 80 77 71  Temp:      TempSrc:      Resp: 20 16 19 19   Height:      Weight:      SpO2: 100% 100% 100% 100%    Intake/Output Summary (Last 24 hours) at 08/15/15 1100 Last data filed at 08/14/15 1728  Gross per 24 hour  Intake    180 ml  Output   2500 ml  Net  -2320 ml   Filed Weights   08/14/15 1243 08/14/15 1556 08/15/15 0724  Weight: 66.1 kg (145 lb 11.6 oz) 63.5 kg (139 lb 15.9 oz) 64.2 kg (141 lb 8.6 oz)    Exam  General: Well developed, well nourished, NAD  HEENT: NCAT,  mucous membranes moist.   Cardiovascular: S1 S2 auscultated, RRR, no murmur  Respiratory: Diminished- LLL, otherwise clear, no wheezing.   Abdomen: Soft, nontender, nondistended, +  bowel sounds  Extremities: warm dry without cyanosis clubbing or edema  Neuro: AAOx2 (self and time), nonfocal  Psych: Appropriate   Data Reviewed: I have personally reviewed following labs and imaging studies  CBC:  Recent Labs Lab 08/13/15 0240 08/13/15 0908 08/14/15 0803 08/15/15 0530  WBC 10.0 10.9* 9.9 8.2  NEUTROABS 6.1 8.2*  --   --   HGB 9.6* 9.3* 8.6* 8.9*  HCT 31.5* 30.5* 28.5* 29.6*  MCV 101.0* 100.0 101.1* 99.0  PLT 279 252 205 185   Basic Metabolic Panel:  Recent Labs Lab 08/13/15 0240 08/13/15 0908 08/14/15 0803 08/15/15 0530  NA 139 140 133* 133*  K 5.2* 6.9* 5.5* 4.6  CL 102 106 97* 93*  CO2 23 21* 23 30  GLUCOSE 113* 109* 66 56*  BUN 63* 67* 33* 17  CREATININE 9.89* 10.52* 7.16* 5.02*  CALCIUM 8.9 8.5* 8.8* 9.0   GFR: Estimated Creatinine Clearance: 7.9 mL/min (by C-G formula based on Cr of 5.02). Liver Function Tests:  Recent Labs Lab 08/13/15 0240 08/13/15 0908  AST 29 24  ALT 26 24  ALKPHOS 148* 120  BILITOT 0.7 0.7  PROT 7.3 7.1  ALBUMIN 3.4* 3.2*   No results for input(s): LIPASE, AMYLASE in the last 168 hours. No results for input(s): AMMONIA in the last 168 hours. Coagulation Profile: No results for input(s): INR, PROTIME in the last 168 hours. Cardiac Enzymes:  Recent Labs Lab 08/13/15 0240 08/13/15 0908  TROPONINI 0.05* 0.06*   BNP (last 3 results) No results for input(s): PROBNP in the last 8760 hours. HbA1C: No results for input(s): HGBA1C in the last 72 hours. CBG:  Recent Labs Lab 08/14/15 0648 08/14/15 0814 08/14/15 1218 08/14/15 1706 08/14/15 2101  GLUCAP 72 72 87 72 97   Lipid Profile: No results for input(s): CHOL, HDL, LDLCALC, TRIG, CHOLHDL, LDLDIRECT in the last 72 hours. Thyroid Function Tests: No results for input(s): TSH, T4TOTAL, FREET4, T3FREE, THYROIDAB in the last 72 hours. Anemia Panel: No results for input(s): VITAMINB12, FOLATE, FERRITIN, TIBC, IRON, RETICCTPCT in the last 72  hours. Urine analysis:    Component Value Date/Time   COLORURINE YELLOW 08/11/2012 1917   APPEARANCEUR CLOUDY* 08/11/2012 1917   LABSPEC 1.013 08/11/2012 1917   PHURINE 7.5 08/11/2012 1917   GLUCOSEU 100* 08/11/2012 1917   HGBUR TRACE* 08/11/2012 1917   BILIRUBINUR NEGATIVE 08/11/2012 1917   KETONESUR NEGATIVE 08/11/2012 1917   PROTEINUR >300* 08/11/2012 1917   UROBILINOGEN 0.2 08/11/2012 1917   NITRITE NEGATIVE 08/11/2012 1917   LEUKOCYTESUR NEGATIVE 08/11/2012 1917   Sepsis Labs: (procalcitonin:4,lacticidven:4)  ) Recent Results (from the past 240 hour(s))  Culture, blood (Routine X 2) w Reflex to ID Panel     Status: None (Preliminary result)   Collection Time: 08/13/15  3:48 AM  Result Value Ref Range Status   Specimen Description BLOOD RIGHT FOREARM  Final   Special Requests IN PEDIATRIC BOTTLE  Final   Culture NO GROWTH 1 DAY  Final   Report Status PENDING  Incomplete  Culture, blood (Routine X 2) w Reflex to ID Panel     Status: None (Preliminary result)   Collection Time: 08/13/15  3:55 AM  Result Value Ref Range Status   Specimen Description BLOOD RIGHT HAND  Final   Special Requests AEROBIC BOTTLE ONLY  Final   Culture NO GROWTH 1 DAY  Final   Report Status PENDING  Incomplete  MRSA PCR Screening     Status: None   Collection Time: 08/13/15  2:23 PM  Result Value Ref Range Status   MRSA by PCR NEGATIVE NEGATIVE Final    Comment:        The GeneXpert MRSA Assay (FDA approved for NASAL specimens only), is one component of a comprehensive MRSA colonization surveillance program. It is not intended to diagnose MRSA infection nor to guide or monitor treatment for MRSA infections.       Radiology Studies: Dg Chest Port 1 View  08/14/2015  CLINICAL DATA:  Pneumonia, edema. EXAM: PORTABLE CHEST 1 VIEW COMPARISON:  Aug 13, 2015. FINDINGS: Stable cardiomediastinal silhouette. Bilateral diffuse interstitial densities are noted concerning for  edema or possibly inflammation. Mild right basilar subsegmental atelectasis or inflammation is noted.  No pneumothorax or significant pleural effusion is noted. Bony thorax is unremarkable. IMPRESSION: Bilateral diffuse interstitial densities are noted concerning for edema or possibly inflammation. Mild right basilar subsegmental atelectasis or inflammation is noted as well. Electronically Signed   By: Lupita Raider, M.D.   On: 08/14/2015 10:59     Scheduled Meds: . amLODipine  5 mg Oral Daily  . calcium acetate  1,334 mg Oral QAC supper  . ceFEPime (MAXIPIME) IV  2 g Intravenous Q T,Th,Sat-1800  . cloNIDine  0.2 mg Oral BID  . gabapentin  100 mg Oral QHS  . heparin  5,000 Units Subcutaneous Q8H  . insulin aspart  0-9 Units Subcutaneous TID WC  . insulin aspart  6 Units Subcutaneous TID WC  . insulin glargine  16 Units Subcutaneous QHS  . ipratropium-albuterol  3 mL Nebulization BID  . pravastatin  40 mg Oral Daily  . sodium chloride flush  3 mL Intravenous Q12H  . vancomycin  750 mg Intravenous Q T,Th,Sa-HD  . [START ON 08/21/2015] Vitamin D (Ergocalciferol)  50,000 Units Oral Q Wed   Continuous Infusions:    LOS: 2 days   Time Spent in minutes   45 minutes  Liberty Stead D.O. on 08/15/2015 at 11:00 AM  Between 7am to 7pm - Pager - [phone]  After 7pm go to [email or web address] - password [email or web address]  And look for the night coverage person covering for me after hours  [facility] Office  [phone]

## 2015-08-16 DIAGNOSIS — Z515 Encounter for palliative care: Secondary | ICD-10-CM | POA: Insufficient documentation

## 2015-08-16 LAB — CBC
HCT: 30.9 % — ABNORMAL LOW (ref 36.0–46.0)
Hemoglobin: 9.3 g/dL — ABNORMAL LOW (ref 12.0–15.0)
MCH: 30.2 pg (ref 26.0–34.0)
MCHC: 30.1 g/dL (ref 30.0–36.0)
MCV: 100.3 fL — ABNORMAL HIGH (ref 78.0–100.0)
Platelets: 169 10*3/uL (ref 150–400)
RBC: 3.08 MIL/uL — ABNORMAL LOW (ref 3.87–5.11)
RDW: 14.9 % (ref 11.5–15.5)
WBC: 9.4 10*3/uL (ref 4.0–10.5)

## 2015-08-16 LAB — BASIC METABOLIC PANEL
Anion gap: 11 (ref 5–15)
BUN: 17 mg/dL (ref 6–20)
CALCIUM: 9 mg/dL (ref 8.9–10.3)
CO2: 28 mmol/L (ref 22–32)
CREATININE: 4.79 mg/dL — AB (ref 0.44–1.00)
Chloride: 93 mmol/L — ABNORMAL LOW (ref 101–111)
GFR calc non Af Amer: 8 mL/min — ABNORMAL LOW (ref >60)
GFR, EST AFRICAN AMERICAN: 9 mL/min — AB (ref >60)
Glucose, Bld: 83 mg/dL (ref 65–99)
Potassium: 4.3 mmol/L (ref 3.5–5.1)
SODIUM: 132 mmol/L — AB (ref 135–145)

## 2015-08-16 LAB — GLUCOSE, CAPILLARY
GLUCOSE-CAPILLARY: 229 mg/dL — AB (ref 65–99)
GLUCOSE-CAPILLARY: 180 mg/dL — AB (ref 65–99)
GLUCOSE-CAPILLARY: 123 mg/dL — AB (ref 65–99)
Glucose-Capillary: 137 mg/dL — ABNORMAL HIGH (ref 65–99)

## 2015-08-16 LAB — HEPATITIS B SURFACE ANTIGEN: HEP B S AG: NEGATIVE

## 2015-08-16 MED ORDER — CLONIDINE HCL 0.1 MG PO TABS: 0.1000 mg | ORAL_TABLET | Freq: Three times a day (TID) | ORAL | Status: DC | PRN

## 2015-08-16 NOTE — Progress Notes (Signed)
PROGRESS NOTE    Nicole Cox  ZOX:[id number] DOB: June 30, 1931 DOA: 08/13/2015 PCP: Joana Reamer, MD   Brief Narrative:  HPI On 08/13/2015 by Dr. Midge Minium Nicole Cox is a 82 y.o. female with medical history significant of ESRD on hemodialysis on Tuesday Thursday and Saturday, hypertension, COPD, CAD, chronic anemia presents to the ER because of increasing shortness of breath. Patient states she has been having shortness of breath off and on for the last 1 week. Patient was admitted in the beginning of the last month at [facility] for pneumonia. Chest x-ray today shows right-sided infiltrates concerning for pneumonia. Patient states she also has been having off-and-on pleuritic-type of chest pain when she coughs the last time she had it was 3 days ago. Patient states she did not miss her dialysis. Patient's blood pressure is also found to be elevated with systolic more than 180. Patient is being admitted for acute respiratory failure probably from pneumonia and also some contribution from fluid overload.  Assessment & Plan   Acute respiratory failure with hypoxia -Patient did require BiPAP upon admission -initially placed on vancomycin and cefepime for possible pneumonia- discontinued -Continue nebulizers -Repeat chest x-ray 5/31: B/L diffuse interstitial densities- concerning for edema or possibly inflammation.  -likely volume related instead of infection.  Currently WBC trending downward, afebrile.  -Improved with HD  Acute encephalopathy -Possibly due to HD and hypotension -patient was AAOx3, this morning.  Received a call that patient became unresponsive -Dr. Arlean Hopping was at bedside and ordered NS bolus -Will continue to monitor closely -Spoke with daughter regarding palliative care consult.  -Daughter also stated this has happened before while in the hospital.  Patient was also hesitant in starting dialysis but would like everything done if possible.    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 Spoke with daughter via phone.  Disposition Plan: Admitted, pending improvement. Continue to monitor in stepdown  Consultants Nephrology Palliative care  Procedures  None  Antibiotics   Anti-infectives    Start     Dose/Rate Route Frequency Ordered Stop   08/21/15 1200  vancomycin (VANCOCIN) IVPB 750 mg/150 ml premix  Status:  Discontinued     750 mg 150 mL/hr over 60 Minutes Intravenous Every Wed (Hemodialysis) 08/14/15 1349 08/14/15 1428   08/15/15 1800  ceFEPIme (MAXIPIME) 2 g in dextrose 5 % 50 mL IVPB  Status:  Discontinued     2 g 100 mL/hr over 30 Minutes Intravenous Every T-Th-Sa (1800) 08/14/15 1350 08/15/15 1110   08/15/15 0904  Vancomycin (VANCOCIN) 750-5 MG/150ML-% IVPB    Comments:  Carlyon Prows   : cabinet override      08/15/15 0904 08/15/15 0916   08/14/15 2000  ceFEPIme (MAXIPIME) 1 g in dextrose 5 % 50 mL IVPB     1 g 100 mL/hr over 30 Minutes Intravenous  Once 08/14/15 1350 08/14/15 2200   08/14/15 1509  Vancomycin (VANCOCIN) 750-5 MG/150ML-% IVPB  Comments:  Magodo, Dain   : cabinet override      08/14/15 1509 08/15/15 0314   08/14/15 1430  vancomycin (VANCOCIN) IVPB 750 mg/150 ml premix  Status:  Discontinued     750 mg 150 mL/hr over 60 Minutes Intravenous Every Wed (Hemodialysis)  08/14/15 1428 08/15/15 0831   08/14/15 0000  vancomycin (VANCOCIN) IVPB 750 mg/150 ml premix  Status:  Discontinued     750 mg 150 mL/hr over 60 Minutes Intravenous Every T-Th-Sa (Hemodialysis) 08/13/15 0359 08/13/15 0930   08/14/15 0000  ceFEPIme (MAXIPIME) 2 g in dextrose 5 % 50 mL IVPB  Status:  Discontinued     2 g 100 mL/hr over 30 Minutes Intravenous Every T-Th-Sa (Hemodialysis) 08/13/15 0609 08/13/15 1407   08/13/15 1415  ceFEPIme (MAXIPIME) 2 g in dextrose 5 % 50 mL IVPB  Status:  Discontinued     2 g 100 mL/hr over 30 Minutes Intravenous Every T-Th-Sa (Hemodialysis) 08/13/15 1407 08/14/15 1350   08/13/15 0930  vancomycin (VANCOCIN) IVPB 750 mg/150 ml premix  Status:  Discontinued     750 mg 150 mL/hr over 60 Minutes Intravenous Every T-Th-Sa (Hemodialysis) 08/13/15 0930 08/15/15 1110   08/13/15 0600  ceFEPIme (MAXIPIME) 1 g in dextrose 5 % 50 mL IVPB  Status:  Discontinued     1 g 100 mL/hr over 30 Minutes Intravenous Every 8 hours 08/13/15 0559 08/13/15 0609   08/13/15 0400  ceFEPIme (MAXIPIME) 2 g in dextrose 5 % 50 mL IVPB     2 g 100 mL/hr over 30 Minutes Intravenous  Once 08/13/15 0346 08/13/15 0444   08/13/15 0400  vancomycin (VANCOCIN) 1,500 mg in sodium chloride 0.9 % 500 mL IVPB     1,500 mg 250 mL/hr over 120 Minutes Intravenous  Once 08/13/15 0357 08/13/15 0715   08/13/15 0345  ceFEPIme (MAXIPIME) 1 g in dextrose 5 % 50 mL IVPB  Status:  Discontinued     1 g 100 mL/hr over 30 Minutes Intravenous  Once 08/13/15 0343 08/13/15 0346      Subjective:   Nicole Cox seen and examined today. She has no complaints today. Wanted to go to the bathroom.  Denies chest pain, shortness breath, abdominal pain, nausea or vomiting, dizziness or headache.  Objective:   Filed Vitals:   08/16/15 0711 08/16/15 0905 08/16/15 1002 08/16/15 1012  BP: 111/88  108/38 105/42  Pulse: 111  65 62  Temp: 98.5 F (36.9 C)     TempSrc: Oral     Resp: 20  17 18   Height:      Weight:       SpO2: 100% 100% 95% 95%    Intake/Output Summary (Last 24 hours) at 08/16/15 1051 Last data filed at 08/16/15 0700  Gross per 24 hour  Intake    360 ml  Output   2000 ml  Net  -1640 ml   Filed Weights   08/15/15 0724 08/15/15 1054 08/16/15 0444  Weight: 64.2 kg (141 lb 8.6 oz) 64.5 kg (142 lb 3.2 oz) 63 kg (138 lb 14.2 oz)    Exam  General: Well developed, well nourished, NAD  HEENT: NCAT,  mucous membranes moist.   Cardiovascular: S1 S2 auscultated, RRR, no murmur  Respiratory: Clear to ausculation   Abdomen: Soft, nontender, nondistended, + bowel sounds  Extremities: warm dry without cyanosis clubbing or edema  Neuro: AAOx3 (self, place, time), nonfocal  Psych: Appropriate mood and affect   Data Reviewed: I have personally reviewed following labs  and imaging studies  CBC:  Recent Labs Lab 08/13/15 0240 08/13/15 0908 08/14/15 0803 08/15/15 0530 08/16/15 0542  WBC 10.0 10.9* 9.9 8.2 9.4  NEUTROABS 6.1 8.2*  --   --   --   HGB 9.6* 9.3* 8.6* 8.9* 9.3*  HCT 31.5* 30.5* 28.5* 29.6* 30.9*  MCV 101.0* 100.0 101.1* 99.0 100.3*  PLT 279 252 205 185 169   Basic Metabolic Panel:  Recent Labs Lab 08/13/15 0240 08/13/15 0908 08/14/15 0803 08/15/15 0530 08/16/15 0542  NA 139 140 133* 133* 132*  K 5.2* 6.9* 5.5* 4.6 4.3  CL 102 106 97* 93* 93*  CO2 23 21* GLUCOSE 113* 109* 66 56* 83  BUN 63* 67* 33* 17 17  CREATININE 9.89* 10.52* 7.16* 5.02* 4.79*  CALCIUM 8.9 8.5* 8.8* 9.0 9.0   GFR: Estimated Creatinine Clearance: 8.2 mL/min (by C-G formula based on Cr of 4.79). Liver Function Tests:  Recent Labs Lab 08/13/15 0240 08/13/15 0908  AST 29 24  ALT 26 24  ALKPHOS 148* 120  BILITOT 0.7 0.7  PROT 7.3 7.1  ALBUMIN 3.4* 3.2*   No results for input(s): LIPASE, AMYLASE in the last 168 hours. No results for input(s): AMMONIA in the last 168 hours. Coagulation Profile: No results for input(s): INR, PROTIME in the last 168 hours. Cardiac  Enzymes:  Recent Labs Lab 08/13/15 0240 08/13/15 0908  TROPONINI 0.05* 0.06*   BNP (last 3 results) No results for input(s): PROBNP in the last 8760 hours. HbA1C: No results for input(s): HGBA1C in the last 72 hours. CBG:  Recent Labs Lab 08/14/15 2101 08/15/15 1142 08/15/15 1612 08/15/15 2109 08/16/15 0812  GLUCAP 97 74 143* 115* 229*   Lipid Profile: No results for input(s): CHOL, HDL, LDLCALC, TRIG, CHOLHDL, LDLDIRECT in the last 72 hours. Thyroid Function Tests: No results for input(s): TSH, T4TOTAL, FREET4, T3FREE, THYROIDAB in the last 72 hours. Anemia Panel: No results for input(s): VITAMINB12, FOLATE, FERRITIN, TIBC, IRON, RETICCTPCT in the last 72 hours. Urine analysis:    Component Value Date/Time   COLORURINE YELLOW 08/11/2012 1917   APPEARANCEUR CLOUDY* 08/11/2012 1917   LABSPEC 1.013 08/11/2012 1917   PHURINE 7.5 08/11/2012 1917   GLUCOSEU 100* 08/11/2012 1917   HGBUR TRACE* 08/11/2012 1917   BILIRUBINUR NEGATIVE 08/11/2012 1917   KETONESUR NEGATIVE 08/11/2012 1917   PROTEINUR >300* 08/11/2012 1917   UROBILINOGEN 0.2 08/11/2012 1917   NITRITE NEGATIVE 08/11/2012 1917   LEUKOCYTESUR NEGATIVE 08/11/2012 1917   Sepsis Labs: (procalcitonin:4,lacticidven:4)  ) Recent Results (from the past 240 hour(s))  Culture, blood (Routine X 2) w Reflex to ID Panel     Status: None (Preliminary result)   Collection Time: 08/13/15  3:48 AM  Result Value Ref Range Status   Specimen Description BLOOD RIGHT FOREARM  Final   Special Requests IN PEDIATRIC BOTTLE  Final   Culture NO GROWTH 2 DAYS  Final   Report Status PENDING  Incomplete  Culture, blood (Routine X 2) w Reflex to ID Panel     Status: None (Preliminary result)   Collection Time: 08/13/15  3:55 AM  Result Value Ref Range Status   Specimen Description BLOOD RIGHT HAND  Final   Special Requests AEROBIC BOTTLE ONLY  Final   Culture NO GROWTH 2 DAYS  Final   Report Status PENDING   Incomplete  MRSA PCR Screening     Status: None   Collection Time: 08/13/15  2:23 PM  Result Value Ref Range  Status   MRSA by PCR NEGATIVE NEGATIVE Final    Comment:        The GeneXpert MRSA Assay (FDA approved for NASAL specimens only), is one component of a comprehensive MRSA colonization surveillance program. It is not intended to diagnose MRSA infection nor to guide or monitor treatment for MRSA infections.       Radiology Studies: No results found.   Scheduled Meds: . antiseptic oral rinse  7 mL Mouth Rinse BID  . calcium acetate  1,334 mg Oral QAC supper  . gabapentin  100 mg Oral QHS  . heparin  5,000 Units Subcutaneous Q8H  . insulin aspart  0-9 Units Subcutaneous TID WC  . ipratropium-albuterol  3 mL Nebulization BID  . pravastatin  40 mg Oral Daily  . sodium chloride flush  3 mL Intravenous Q12H  . [START ON 08/21/2015] Vitamin D (Ergocalciferol)  50,000 Units Oral Q Wed   Continuous Infusions:    LOS: 3 days   Time Spent in minutes   45 minutes  Nechemia Chiappetta D.O. on 08/16/2015 at 10:51 AM  Between 7am to 7pm - Pager - [phone]  After 7pm go to [email or web address] - password [email or web address]  And look for the night coverage person covering for me after hours  [facility] Office  [phone]

## 2015-08-16 NOTE — Progress Notes (Addendum)
   08/16/15 1100  Vitals  BP (!) 130/38 mmHg  MAP (mmHg) 65  Pulse Rate 63  ECG Heart Rate 64  Resp 19  Oxygen Therapy  SpO2 95 %  pt easy to arouse, grips equal, no slurred speech, alert and oriented at this time.

## 2015-08-16 NOTE — Progress Notes (Signed)
  [facility] Progress Note   Subjective: lethargic  Filed Vitals:   08/16/15 0035 08/16/15 0444 08/16/15 0711 08/16/15 0905  BP: 116/35 141/38 111/88   Pulse: 60 63 111   Temp: 98.7 F (37.1 C)  98.5 F (36.9 C)   TempSrc: Oral  Oral   Resp: 18 13 20    Height:      Weight:  63 kg (138 lb 14.2 oz)    SpO2: 100% 100% 100% 100%    Inpatient medications: . antiseptic oral rinse  7 mL Mouth Rinse BID  . calcium acetate  1,334 mg Oral QAC supper  . cloNIDine  0.2 mg Oral BID  . gabapentin  100 mg Oral QHS  . heparin  5,000 Units Subcutaneous Q8H  . insulin aspart  0-9 Units Subcutaneous TID WC  . ipratropium-albuterol  3 mL Nebulization BID  . pravastatin  40 mg Oral Daily  . sodium chloride flush  3 mL Intravenous Q12H  . [START ON 08/21/2015] Vitamin D (Ergocalciferol)  50,000 Units Oral Q Wed     acetaminophen **OR** acetaminophen, camphor-menthol **AND** hydrOXYzine, feeding supplement (NEPRO CARB STEADY), hydrALAZINE, ipratropium-albuterol, ondansetron **OR** ondansetron (ZOFRAN) IV, oxyCODONE-acetaminophen, traMADol  Exam: No distress No jvd or bruits Chest clear bilat RRR no MRG Abd soft ntnd no mass or ascites +bs Ext no LE edema Neuro is alert, Ox 3 , nf  CXR 5/30 - diffuse R-sided infiltrates, edema vs PNA CXR 5/31 - resolution of R dense infiltrates, residual IS edema mild-mod  Dialysis: TTS [place] on [address] 3h  66.5kg (146lb)   3K/2.0 bath  LUA AVG  Hep 3900 then 500/hr   Assessment: 1. Acute resp failure/ hypoxemia/ pulm infiltrates - suspect pulm edema, down 8 kg from admit and 3 kg under prior dry wt.  No fevers, we could consider stopping antibiotics as she is afebrile and asymptomatic now.     2. Hx COPD home O2- RA sat is 94% now 3. HTN norvasc/ clonidine, bp's down , will reduce BP meds 4. DM on insulin 5. Hyperkalemia- better 6. AMS - has some chron dementia per chart. This am is poorly responsive now but was fine an hour  ago.  Will give NS bolus as BP's low 100's.    Plan - stop norvasc/ clon, NS bolus now.    Vinson Moselleob Dyllan Hughett MD [facility] pager [phone]    cell [phone] 08/16/2015, 9:57 AM    Recent Labs Lab 08/14/15 0803 08/15/15 0530 08/16/15 0542  NA 133* 133* 132*  K 5.5* 4.6 4.3  CL 97* 93* 93*  CO2 23 30 28   GLUCOSE 66 56* 83  BUN 33* 17 17  CREATININE 7.16* 5.02* 4.79*  CALCIUM 8.8* 9.0 9.0    Recent Labs Lab 08/13/15 0240 08/13/15 0908  AST 29 24  ALT 26 24  ALKPHOS 148* 120  BILITOT 0.7 0.7  PROT 7.3 7.1  ALBUMIN 3.4* 3.2*    Recent Labs Lab 08/13/15 0240 08/13/15 0908 08/14/15 0803 08/15/15 0530 08/16/15 0542  WBC 10.0 10.9* 9.9 8.2 9.4  NEUTROABS 6.1 8.2*  --   --   --   HGB 9.6* 9.3* 8.6* 8.9* 9.3*  HCT 31.5* 30.5* 28.5* 29.6* 30.9*  MCV 101.0* 100.0 101.1* 99.0 100.3*  PLT 279 252 205 185 169

## 2015-08-16 NOTE — Consult Note (Addendum)
Consultation Note Date: 08/16/2015   Patient Name: Nicole Cox  DOB: [birth year]  MRN: [id number]  Age / Sex: 82 y.o., female  PCP: Nicole Docker, MD Referring Physician: Cristal Ford, DO  Reason for Consultation: Establishing goals of care  HPI/Patient Profile: 82 y.o. female  with past medical history of End-stage renal disease on hemodialysis, hypertension, COPD, coronary artery disease, chronic anemia admitted on 08/13/2015 with increasing shortness of breath. Patient was admitted in the beginning of last month [facility] for pneumonia. Chest x-ray upon admission showed right-sided infiltrates concerning for pneumonia. At the time of admission patient was speaking for herself coherently was able to walk with a walker. Since admission she's had an acute mental status change and was almost unresponsive this morning. She received a 500 cc fluid bolus and she is awake and alert at this point. Her niece, Mrs. Nicole Cox, who is her healthcare POA, states "this happens to her every time she comes into the hospital" meaning mental status changes, confusion and weakness. She says she has known her to be nonverbal during previous hospitalizations and also to weak to the point where she was unable to walk. Mrs. Nicole Cox and I assisted [name], and she was a full two-person assist, unable to bear any weight or assist with pivot. She is speaking some and tells me when she hears I work for palliative medicine "I'm not ready to go yet".  Clinical Assessment and Goals of Care: Patient is more alert and conversant today although still presents with some confusion. She is very weak, as noted above is a full two-person assist. He denies any pain or shortness of breath. She is up in the recliner when I come into the room and is eating and feeding herself. Her knee shares that she has always told her she "wants  everything done". Focus of this initial meeting was to introduce the concept of palliative care versus hospice, as well as pathophysiology related to end-stage renal disease and clinical presentation she may be witnessing with her aunt's disease progression such as shortness of breath. She also shares with me that her arm wakes up at approximately 3:00 in the morning extremely short of breath. She does not have a rescue inhaler at home, or nebulizer machine.  [place] [phone] Patient is married. She and her husband had no children. She has raised her-2 nieces as well as nephew since birth. Mrs. Nicole Cox is her healthcare POA    SUMMARY OF RECOMMENDATIONS   Patient would want CPR and intubation No changes to plan of care Continue to treat the treatable [place] goal is to bring her home after hospitalization. Unless absolutely necessary she would not want her transferred to a skilled nursing facility unit for rehabilitation but she does state that unless she is able to be more ambulatory with a walker she would not be able to care for her the way she is today at home Code Status/Advance Care Planning:  Full code    Symptom Management:  Dyspnea: Introduced just very generally the concept of opioids to manage end-stage dyspnea. Would recommend that patient continue on oxygen as tolerated. She does have oxygen at home. Would also recommend that she be discharged with home health coming to her home to provide a nebulizer treatment as well as she will need a rescue inhaler such as a duo nab or ipratropium/albuterol handheld neb  Palliative Prophylaxis:   Aspiration, Bowel Regimen, Delirium Protocol, Frequent Pain Assessment and Turn Reposition  Additional Recommendations (Limitations, Scope, Preferences):  Full Scope Treatment  Psycho-social/Spiritual:   Desire for further Chaplaincy support:no   Prognosis:   Unable to determine  Discharge Planning: Home with Home  Health      Primary Diagnoses: Present on Admission:  . HCAP (healthcare-associated pneumonia) . Acute respiratory failure with hypoxia ([place]) . Hypertensive urgency . Chronic anemia . Acute respiratory failure ([place])  I have reviewed the medical record, interviewed the patient and family, and examined the patient. The following aspects are pertinent.  Past Medical History  Diagnosis Date  . Renal disorder   . Diabetes mellitus without complication ([place])   . Hypertension   . Heart disease   . COPD (chronic obstructive pulmonary disease) ([place])   . Stroke ([place])   . CHF (congestive heart failure) ([place])   . Coronary artery disease    Social History   Social History  . Marital Status: Widowed    Spouse Name: N/A  . Number of Children: N/A  . Years of Education: N/A   Social History Main Topics  . Smoking status: Former Research scientist (life sciences)  . Smokeless tobacco: None  . Alcohol Use: No  . Drug Use: No  . Sexual Activity: Not Asked   Other Topics Concern  . None   Social History Narrative   Family History  Problem Relation Age of Onset  . Heart disease Mother   . Hypertension Mother   . Heart disease Father   . Hypertension Father   . Heart disease Sister   . Diabetes Sister   . Stroke Sister   . Kidney disease Sister   . Hypertension Sister   . Heart disease Sister   . Kidney disease Sister   . Hypertension Sister   . Kidney disease Daughter   . Hypertension Daughter    Scheduled Meds: . antiseptic oral rinse  7 mL Mouth Rinse BID  . calcium acetate  1,334 mg Oral QAC supper  . gabapentin  100 mg Oral QHS  . heparin  5,000 Units Subcutaneous Q8H  . insulin aspart  0-9 Units Subcutaneous TID WC  . ipratropium-albuterol  3 mL Nebulization BID  . pravastatin  40 mg Oral Daily  . sodium chloride flush  3 mL Intravenous Q12H  . [START ON 08/21/2015] Vitamin D (Ergocalciferol)  50,000 Units Oral Q Wed   Continuous Infusions:  PRN Meds:.acetaminophen **OR** acetaminophen,  camphor-menthol **AND** hydrOXYzine, cloNIDine, feeding supplement (NEPRO CARB STEADY), ipratropium-albuterol, ondansetron **OR** ondansetron (ZOFRAN) IV, traMADol Medications Prior to Admission:  Prior to Admission medications   Medication Sig Start Date End Date Taking? Authorizing Provider  acetaminophen (TYLENOL) 500 MG tablet Take 500 mg by mouth every 6 (six) hours as needed for pain.    Historical Provider, MD  amLODipine (NORVASC) 5 MG tablet Take 5 mg by mouth daily.    Historical Provider, MD  calcium acetate (PHOSLO) 667 MG capsule Take 1,334 mg by mouth daily.    Historical Provider, MD  cloNIDine (CATAPRES) 0.2 MG tablet Take 0.2 mg by mouth  2 (two) times daily.    Historical Provider, MD  diclofenac sodium (VOLTAREN) 1 % GEL Apply 2 g topically 4 (four) times daily. 08/16/12   Nita Sells, MD  gabapentin (NEURONTIN) 100 MG capsule Take 1 capsule (100 mg total) by mouth at bedtime. 08/16/12   Nita Sells, MD  insulin aspart (NOVOLOG) 100 UNIT/ML injection Inject 6 Units into the skin 3 (three) times daily with meals.    Historical Provider, MD  insulin glargine (LANTUS) 100 UNIT/ML injection Inject 16 Units into the skin at bedtime.    Historical Provider, MD  oxyCODONE-acetaminophen (PERCOCET/ROXICET) 5-325 MG per tablet Take 1-2 tablets by mouth every 6 (six) hours as needed. 08/16/12   Nita Sells, MD  pravastatin (PRAVACHOL) 40 MG tablet Take 40 mg by mouth daily.    Historical Provider, MD  PRESCRIPTION MEDICATION Inhale 2 puffs into the lungs daily as needed (inhailer).    Historical Provider, MD  traMADol (ULTRAM) 50 MG tablet Take 50 mg by mouth every 8 (eight) hours as needed for pain.     Historical Provider, MD  Vitamin D, Ergocalciferol, (DRISDOL) 50000 UNITS CAPS Take 50,000 Units by mouth daily.    Historical Provider, MD   No Known Allergies Review of Systems  Unable to perform ROS: Mental status change    Physical Exam  Constitutional: She  appears well-developed and well-nourished.  HENT:  Head: Normocephalic and atraumatic.  Neck: Normal range of motion.  Pulmonary/Chest: Effort normal.  Musculoskeletal:  Very weak; unable to bear weight  Neurological:  Was up eating, feeding herself. Then became somnolent once we got her into bed  Skin: Skin is warm and dry.  Nursing note and vitals reviewed.   Vital Signs: BP 130/38 mmHg  Pulse 63  Temp(Src) 98.3 F (36.8 C) (Axillary)  Resp 19  Ht 5' 3"  (1.6 m)  Wt 63 kg (138 lb 14.2 oz)  BMI 24.61 kg/m2  SpO2 95% Pain Assessment: No/denies pain POSS *See Group Information*: S-Acceptable,Sleep, easy to arouse Pain Score: 0-No pain   SpO2: SpO2: 95 % O2 Device:SpO2: 95 % O2 Flow Rate: .O2 Flow Rate (L/min): 2 L/min  IO: Intake/output summary:  Intake/Output Summary (Last 24 hours) at 08/16/15 1451 Last data filed at 08/16/15 0700  Gross per 24 hour  Intake    240 ml  Output      0 ml  Net    240 ml    LBM: Last BM Date: 08/11/15 Baseline Weight: Weight: 71.305 kg (157 lb 3.2 oz) Most recent weight: Weight: 63 kg (138 lb 14.2 oz)     Palliative Assessment/Data:   Flowsheet Rows        Most Recent Value   Intake Tab    Referral Department  Hospitalist   Unit at Time of Referral  Intermediate Care Unit   Palliative Care Primary Diagnosis  Nephrology   Date Notified  08/16/15   Palliative Care Type  New Palliative care   Reason for referral  Clarify Goals of Care   Date of Admission  08/13/15   Date first seen by Palliative Care  08/16/15   # of days Palliative referral response time  0 Day(s)   # of days IP prior to Palliative referral  3   Clinical Assessment    Palliative Performance Scale Score  40%   Pain Max last 24 hours  Not able to report   Pain Min Last 24 hours  Not able to report   Dyspnea Max Last 24  Hours  Not able to report   Dyspnea Min Last 24 hours  Not able to report   Nausea Max Last 24 Hours  Not able to report   Nausea Min Last 24  Hours  Not able to report   Anxiety Max Last 24 Hours  Not able to report   Anxiety Min Last 24 Hours  Not able to report   Other Max Last 24 Hours  Not able to report   Psychosocial & Spiritual Assessment    Palliative Care Outcomes    Patient/Family meeting held?  Yes   Who was at the meeting?  niece, HCPOA Mrs. Hatcher   Palliative Care Outcomes  Clarified goals of care   Palliative Care follow-up planned  No      Time In: 1400 Time Out: 1510 Time Total: 70 min Greater than 50%  of this time was spent counseling and coordinating care related to the above assessment and plan. Staffed with Dr. Collier Salina  Signed by: Dory Horn, NP   Please contact Palliative Medicine Team phone at [phone] for questions and concerns.  For individual provider: See Shea Evans

## 2015-08-16 NOTE — Progress Notes (Signed)
   08/16/15 1002  Vitals  BP (!) 108/38 mmHg  MAP (mmHg) 60  Pulse Rate 65  ECG Heart Rate 66  Resp 17  Oxygen Therapy  SpO2 95 %   Dr. Arlean HoppingSchertz present bolus given

## 2015-08-16 NOTE — Progress Notes (Signed)
Pt unresponsive, pupils pinpoint, not responding to sternal rub. Dr. Arlean HoppingSchertz present, 500cc ns bolus given. Paged dr. Catha GosselinMikhail to give update, will continue to monitor.

## 2015-08-16 NOTE — Care Management Important Message (Signed)
Important Message  Patient Details  Name: Nicole Cox MRN: [id number] Date of Birth: 17-May-1931   Medicare Important Message Given:  Yes    Bernadette HoitShoffner, Zarif Rathje Coleman 08/16/2015, 9:39 AM

## 2015-08-16 NOTE — Care Management Note (Signed)
Case Management Note  Patient Details  Name: Al DecantLizzie Doolin MRN: [id number] Date of Birth: 08-19-1931  Subjective/Objective:  Patient is from home, per palliative note, family want to take patient home with home health, will need to offer choice.                    Action/Plan:   Expected Discharge Date:                  Expected Discharge Plan:  Home w Home Health Services  In-House Referral:  Clinical Social Work  Discharge planning Services  CM Consult  Post Acute Care Choice:    Choice offered to:     DME Arranged:    DME Agency:     HH Arranged:    HH Agency:     Status of Service:  In process, will continue to follow  Medicare Important Message Given:  Yes Date Medicare IM Given:    Medicare IM give by:    Date Additional Medicare IM Given:    Additional Medicare Important Message give by:     If discussed at Long Length of Stay Meetings, dates discussed:    Additional Comments:  Leone Havenaylor, Amiaya Mcneeley Clinton, RN 08/16/2015, 5:51 PM

## 2015-08-16 NOTE — Procedures (Signed)
Pt is resting comfortably on room air.  BiPAP is not indicated at this time.

## 2015-08-16 NOTE — Evaluation (Signed)
Physical Therapy Evaluation Patient Details Name: Nicole Cox Naclerio MRN: [id number] DOB: 1931/04/03 Today's Date: 08/16/2015   History of Present Illness  Nicole Cox Safer is a 82 y.o. female with medical history significant of ESRD on hemodialysis on Tuesday Thursday and Saturday, hypertension, COPD, CAD, chronic anemia presents to the ER because of increasing shortness of breath.  Pt dx with Pt dx with acute respiratory failure with hypoxia. Repeat CXR showed bil diffuse interestitial densities concerning for edema vs inflammation (thought to be volume related not infection), hypertensive urgency.  Pt with significant PMHx of ESRD on HD TTS, DM, HTN, COPD, stroke, CHF, and CAD.  Clinical Impression  Pt is very weak and shaky on her legs.  She is unable to pivot with a RW due to buckling, but could stand pivot with mod assist of the therapist to recliner chair.  She was ambulatory with RW PTA.  Family wishes to take her home, however, did express that she would have to be stronger in order for them to do so safely.  PT recommend SNF level rehab at this time.   PT to follow acutely for deficits listed below.       Follow Up Recommendations SNF    Equipment Recommendations  None recommended by PT    Recommendations for Other Services    NA    Precautions / Restrictions Precautions Precautions: Fall Precaution Comments: pt is very weak on her feet.      Mobility  Bed Mobility Overal bed mobility: Needs Assistance Bed Mobility: Supine to Sit     Supine to sit: Max assist;HOB elevated     General bed mobility comments: Max assist to initiate movement and progres bil legs to EOB.  Supported trunk from elevated EOB.    Transfers Overall transfer level: Needs assistance Equipment used: Rolling walker (2 wheeled) Transfers: Sit to/from UGI CorporationStand;Stand Pivot Transfers Sit to Stand: Mod assist Stand pivot transfers: Mod assist       General transfer comment: Stood x 1 with RW, but pt buckling  over weak legs, not safe to pivot to chair with RW.  Mod assist to stand and pivot with bed pad.   Ambulation/Gait             General Gait Details: unable at this time.          Balance Overall balance assessment: Needs assistance Sitting-balance support: Bilateral upper extremity supported;Feet supported Sitting balance-Leahy Scale: Poor Sitting balance - Comments: min assist to maintain sitting balance EOB.  Postural control: Posterior lean Standing balance support: Bilateral upper extremity supported Standing balance-Leahy Scale: Poor Standing balance comment: mod assist to stand with RW and without RW                             Pertinent Vitals/Pain Pain Assessment: No/denies pain    Home Living Family/patient expects to be discharged to:: Private residence Living Arrangements: Children Available Help at Discharge: Family;Available 24 hours/day Type of Home: House Home Access: Stairs to enter   Entergy CorporationEntrance Stairs-Number of Steps: ~2 per granddaughter report Home Layout: One level Home Equipment: Environmental consultantWalker - 4 wheels;Cane - single point;Walker - 2 wheels      Prior Function Level of Independence: Independent with assistive device(s)         Comments: used RW.  Family reports they could confidently leave and go out and she would be fine PTA.      Hand Dominance   Dominant Hand:  Right    Extremity/Trunk Assessment   Upper Extremity Assessment: Defer to OT evaluation           Lower Extremity Assessment: Generalized weakness      Cervical / Trunk Assessment: Kyphotic  Communication      Cognition Arousal/Alertness: Lethargic Behavior During Therapy: WFL for tasks assessed/performed Overall Cognitive Status: Impaired/Different from baseline Area of Impairment: Attention;Memory;Following commands;Safety/judgement;Awareness;Problem solving   Current Attention Level: Sustained   Following Commands: Follows one step commands  inconsistently;Follows one step commands with increased time Safety/Judgement: Decreased awareness of safety;Decreased awareness of deficits Awareness: Intellectual Problem Solving: Slow processing;Decreased initiation;Difficulty sequencing;Requires verbal cues;Requires tactile cues General Comments: Family reports she always get confuesed in the hospital.      General Comments General comments (skin integrity, edema, etc.): Family would like to take her home, but want her ambulatory.  They expressed that they cannot take her home like she is today.           Assessment/Plan    PT Assessment Patient needs continued PT services  PT Diagnosis Difficulty walking;Abnormality of gait;Generalized weakness;Altered mental status   PT Problem List Decreased strength;Decreased activity tolerance;Decreased balance;Decreased mobility;Decreased cognition;Decreased knowledge of use of DME;Decreased safety awareness  PT Treatment Interventions DME instruction;Gait training;Stair training;Functional mobility training;Therapeutic activities;Therapeutic exercise;Neuromuscular re-education;Balance training;Cognitive remediation;Patient/family education   PT Goals (Current goals can be found in the Care Plan section) Acute Rehab PT Goals Patient Stated Goal: go home PT Goal Formulation: With patient/family Time For Goal Achievement: 08/30/15 Potential to Achieve Goals: Good    Frequency Min 3X/week   Barriers to discharge Decreased caregiver support Family can provide 24/7, but not at her current physical assist level.        End of Session Equipment Utilized During Treatment: Gait belt Activity Tolerance: Patient limited by fatigue Patient left: in chair;with call bell/phone within reach;with chair alarm set;with family/visitor present Nurse Communication: Mobility status         Time: 5956-3875 PT Time Calculation (min) (ACUTE ONLY): 24 min   Charges:   PT Evaluation $PT Eval Moderate  Complexity: 1 Procedure PT Treatments $Therapeutic Activity: 8-22 mins        Lylliana Kitamura B. Takeila Thayne, PT, DPT [phone]   08/16/2015, 5:07 PM

## 2015-08-17 DIAGNOSIS — Z515 Encounter for palliative care: Secondary | ICD-10-CM

## 2015-08-17 LAB — GLUCOSE, CAPILLARY
GLUCOSE-CAPILLARY: 130 mg/dL — AB (ref 65–99)
GLUCOSE-CAPILLARY: 163 mg/dL — AB (ref 65–99)
Glucose-Capillary: 157 mg/dL — ABNORMAL HIGH (ref 65–99)
Glucose-Capillary: 130 mg/dL — ABNORMAL HIGH (ref 65–99)

## 2015-08-17 LAB — CBC
HCT: 29.6 % — ABNORMAL LOW (ref 36.0–46.0)
HEMOGLOBIN: 9.1 g/dL — AB (ref 12.0–15.0)
MCH: 29.5 pg (ref 26.0–34.0)
MCHC: 30.7 g/dL (ref 30.0–36.0)
MCV: 96.1 fL (ref 78.0–100.0)
Platelets: 192 10*3/uL (ref 150–400)
RBC: 3.08 MIL/uL — AB (ref 3.87–5.11)
RDW: 14.6 % (ref 11.5–15.5)
WBC: 8.1 10*3/uL (ref 4.0–10.5)

## 2015-08-17 LAB — BASIC METABOLIC PANEL
Anion gap: 14 (ref 5–15)
BUN: 40 mg/dL — AB (ref 6–20)
CHLORIDE: 94 mmol/L — AB (ref 101–111)
CO2: 25 mmol/L (ref 22–32)
CREATININE: 7.44 mg/dL — AB (ref 0.44–1.00)
Calcium: 8.5 mg/dL — ABNORMAL LOW (ref 8.9–10.3)
GFR calc Af Amer: 5 mL/min — ABNORMAL LOW (ref >60)
GFR, EST NON AFRICAN AMERICAN: 5 mL/min — AB (ref >60)
Glucose, Bld: 127 mg/dL — ABNORMAL HIGH (ref 65–99)
POTASSIUM: 4.1 mmol/L (ref 3.5–5.1)
Sodium: 133 mmol/L — ABNORMAL LOW (ref 135–145)

## 2015-08-17 MED ORDER — NEPRO/CARBSTEADY PO LIQD: 237.0000 mL | Freq: Three times a day (TID) | ORAL | Status: DC | PRN

## 2015-08-17 MED ORDER — HEPARIN SODIUM (PORCINE) 1000 UNIT/ML DIALYSIS
3000.0000 [IU] | INTRAMUSCULAR | Status: DC | PRN
  Filled 2015-08-17: qty 3

## 2015-08-17 MED ORDER — ALTEPLASE 2 MG IJ SOLR: 2.0000 mg | Freq: Once | INTRAMUSCULAR | Status: DC | PRN

## 2015-08-17 MED ORDER — LIDOCAINE-PRILOCAINE 2.5-2.5 % EX CREA: 1.0000 "application " | TOPICAL_CREAM | CUTANEOUS | Status: DC | PRN

## 2015-08-17 MED ORDER — CLONIDINE HCL 0.2 MG PO TABS
0.2000 mg | ORAL_TABLET | Freq: Two times a day (BID) | ORAL | Status: DC
  Administered 2015-08-17: 0.2 mg via ORAL
  Filled 2015-08-17: qty 1

## 2015-08-17 MED ORDER — ALBUTEROL SULFATE HFA 108 (90 BASE) MCG/ACT IN AERS: 2.0000 | INHALATION_SPRAY | Freq: Four times a day (QID) | RESPIRATORY_TRACT | Status: AC | PRN

## 2015-08-17 MED ORDER — SODIUM CHLORIDE 0.9 % IV SOLN: 100.0000 mL | INTRAVENOUS | Status: DC | PRN

## 2015-08-17 MED ORDER — PENTAFLUOROPROP-TETRAFLUOROETH EX AERO: 1.0000 "application " | INHALATION_SPRAY | CUTANEOUS | Status: DC | PRN

## 2015-08-17 MED ORDER — LIDOCAINE HCL (PF) 1 % IJ SOLN: 5.0000 mL | INTRAMUSCULAR | Status: DC | PRN

## 2015-08-17 MED ORDER — LIDOCAINE-PRILOCAINE 2.5-2.5 % EX CREA
1.0000 "application " | TOPICAL_CREAM | CUTANEOUS | Status: DC | PRN
  Filled 2015-08-17: qty 5

## 2015-08-17 MED ORDER — ACETAMINOPHEN 325 MG PO TABS
ORAL_TABLET | ORAL | Status: AC
  Filled 2015-08-17: qty 2

## 2015-08-17 MED ORDER — HEPARIN SODIUM (PORCINE) 1000 UNIT/ML DIALYSIS
1000.0000 [IU] | INTRAMUSCULAR | Status: DC | PRN
  Filled 2015-08-17: qty 1

## 2015-08-17 NOTE — Progress Notes (Signed)
PROGRESS NOTE    Nicole Cox  WUJ:[id number] DOB: 10-20-1931 DOA: 08/13/2015 PCP: Joana Reamer, MD   Brief Narrative:  HPI On 08/13/2015 by Dr. Midge Minium Nicole Cox is a 82 y.o. female with medical history significant of ESRD on hemodialysis on Tuesday Thursday and Saturday, hypertension, COPD, CAD, chronic anemia presents to the ER because of increasing shortness of breath. Patient states she has been having shortness of breath off and on for the last 1 week. Patient was admitted in the beginning of the last month at [facility] for pneumonia. Chest x-ray today shows right-sided infiltrates concerning for pneumonia. Patient states she also has been having off-and-on pleuritic-type of chest pain when she coughs the last time she had it was 3 days ago. Patient states she did not miss her dialysis. Patient's blood pressure is also found to be elevated with systolic more than 180. Patient is being admitted for acute respiratory failure probably from pneumonia and also some contribution from fluid overload.  Assessment & Plan   Acute respiratory failure with hypoxia/pulmonary edema in patient with ESRD -Patient did require BiPAP upon admission -initially placed on vancomycin and cefepime for possible pneumonia- discontinued -Continue nebulizers -Repeat chest x-ray 5/31: B/L diffuse interstitial densities- concerning for edema or possibly inflammation.  -likely volume related instead of infection. Currently WBC trending downward, afebrile.  -Improved with HD  Acute encephalopathy -Resolved, Possibly due to HD and hypotension (patient perked up with 500cc bolus given on 08/16/2015) -patient was AAOx3, this morning. Received a call that patient became unresponsive   Hypertensive urgency -Resolved -Patient had low normal BP, spoke with Dr. Arlean Hopping, recommended patient take only clonidine. 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 Spoke with daughter via phone.  Disposition Plan: Admitted, pending improvement. Continue to monitor in stepdown  Consultants Nephrology Palliative care  Procedures  None  Antibiotics   Anti-infectives    Start     Dose/Rate Route Frequency Ordered Stop   08/21/15 1200  vancomycin (VANCOCIN) IVPB 750 mg/150 ml premix  Status:  Discontinued     750 mg 150 mL/hr over 60 Minutes Intravenous Every Wed (Hemodialysis) 08/14/15 1349 08/14/15 1428   08/15/15 1800  ceFEPIme (MAXIPIME) 2 g in dextrose 5 % 50 mL IVPB  Status:  Discontinued     2 g 100 mL/hr over 30 Minutes Intravenous Every T-Th-Sa (1800) 08/14/15 1350 08/15/15 1110   08/15/15 0904  Vancomycin (VANCOCIN) 750-5 MG/150ML-% IVPB    Comments:  Carlyon Prows   : cabinet override      08/15/15 0904 08/15/15 0916   08/14/15 2000  ceFEPIme (MAXIPIME) 1 g in dextrose 5 % 50 mL IVPB     1 g 100 mL/hr over 30 Minutes Intravenous  Once 08/14/15 1350 08/14/15 2200   08/14/15 1509  Vancomycin (VANCOCIN) 750-5 MG/150ML-% IVPB    Comments:  Magodo, Dain   :  cabinet override      08/14/15 1509 08/15/15 0314   08/14/15 1430  vancomycin (VANCOCIN) IVPB 750 mg/150 ml premix  Status:  Discontinued     750 mg 150 mL/hr over 60 Minutes Intravenous Every Wed (Hemodialysis) 08/14/15 1428 08/15/15 0831   08/14/15 0000   vancomycin (VANCOCIN) IVPB 750 mg/150 ml premix  Status:  Discontinued     750 mg 150 mL/hr over 60 Minutes Intravenous Every T-Th-Sa (Hemodialysis) 08/13/15 0359 08/13/15 0930   08/14/15 0000  ceFEPIme (MAXIPIME) 2 g in dextrose 5 % 50 mL IVPB  Status:  Discontinued     2 g 100 mL/hr over 30 Minutes Intravenous Every T-Th-Sa (Hemodialysis) 08/13/15 0609 08/13/15 1407   08/13/15 1415  ceFEPIme (MAXIPIME) 2 g in dextrose 5 % 50 mL IVPB  Status:  Discontinued     2 g 100 mL/hr over 30 Minutes Intravenous Every T-Th-Sa (Hemodialysis) 08/13/15 1407 08/14/15 1350   08/13/15 0930  vancomycin (VANCOCIN) IVPB 750 mg/150 ml premix  Status:  Discontinued     750 mg 150 mL/hr over 60 Minutes Intravenous Every T-Th-Sa (Hemodialysis) 08/13/15 0930 08/15/15 1110   08/13/15 0600  ceFEPIme (MAXIPIME) 1 g in dextrose 5 % 50 mL IVPB  Status:  Discontinued     1 g 100 mL/hr over 30 Minutes Intravenous Every 8 hours 08/13/15 0559 08/13/15 0609   08/13/15 0400  ceFEPIme (MAXIPIME) 2 g in dextrose 5 % 50 mL IVPB     2 g 100 mL/hr over 30 Minutes Intravenous  Once 08/13/15 0346 08/13/15 0444   08/13/15 0400  vancomycin (VANCOCIN) 1,500 mg in sodium chloride 0.9 % 500 mL IVPB     1,500 mg 250 mL/hr over 120 Minutes Intravenous  Once 08/13/15 0357 08/13/15 0715   08/13/15 0345  ceFEPIme (MAXIPIME) 1 g in dextrose 5 % 50 mL IVPB  Status:  Discontinued     1 g 100 mL/hr over 30 Minutes Intravenous  Once 08/13/15 0343 08/13/15 0346      Subjective:   [name] seen and examined today. She has no complaints today. Would like to go home. Denies chest pain, shortness breath, abdominal pain, nausea or vomiting, dizziness or headache.  Objective:   Filed Vitals:   08/17/15 0330 08/17/15 0727 08/17/15 0822 08/17/15 1118  BP: 152/48  152/62 166/47  Pulse: 45  75 85  Temp: 97.6 F (36.4 C)  98.2 F (36.8 C) 99.1 F (37.3 C)  TempSrc: Axillary  Oral Oral  Resp: 20  18 17   Height: 5\' 2"  (1.575 m)       Weight: 64.093 kg (141 lb 4.8 oz)     SpO2: 94% 98% 98% 97%    Intake/Output Summary (Last 24 hours) at 08/17/15 1153 Last data filed at 08/17/15 1610  Gross per 24 hour  Intake    300 ml  Output      0 ml  Net    300 ml   Filed Weights   08/15/15 1054 08/16/15 0444 08/17/15 0330  Weight: 64.5 kg (142 lb 3.2 oz) 63 kg (138 lb 14.2 oz) 64.093 kg (141 lb 4.8 oz)    Exam  General: Well developed, well nourished, NAD  HEENT: NCAT,  mucous membranes moist.   Cardiovascular: S1 S2 auscultated, RRR, no murmur  Respiratory: Clear to ausculation   Abdomen: Soft, nontender, nondistended, + bowel sounds  Extremities: warm dry without cyanosis clubbing or edema  Neuro: AAOx3 , nonfocal  Psych: Appropriate mood and affect, pleasant  Data Reviewed: I have personally reviewed following labs and imaging studies  CBC:  Recent Labs Lab 08/13/15 0240 08/13/15 0908 08/14/15 0803 08/15/15 0530 08/16/15 0542 08/17/15 0733  WBC 10.0 10.9* 9.9 8.2 9.4 8.1  NEUTROABS 6.1 8.2*  --   --   --   --   HGB 9.6* 9.3* 8.6* 8.9* 9.3* 9.1*  HCT 31.5* 30.5* 28.5* 29.6* 30.9* 29.6*  MCV 101.0* 100.0 101.1* 99.0 100.3* 96.1  PLT 279 252 205 185 169 192   Basic Metabolic Panel:  Recent Labs Lab 08/13/15 0908 08/14/15 0803 08/15/15 0530 08/16/15 0542 08/17/15 0733  NA 140 133* 133* 132* 133*  K 6.9* 5.5* 4.6 4.3 4.1  CL 106 97* 93* 93* 94*  CO2 21* GLUCOSE 109* 66 56* 83 127*  BUN 67* 33* 17 17 40*  CREATININE 10.52* 7.16* 5.02* 4.79* 7.44*  CALCIUM 8.5* 8.8* 9.0 9.0 8.5*   GFR: Estimated Creatinine Clearance: 5.2 mL/min (by C-G formula based on Cr of 7.44). Liver Function Tests:  Recent Labs Lab 08/13/15 0240 08/13/15 0908  AST 29 24  ALT 26 24  ALKPHOS 148* 120  BILITOT 0.7 0.7  PROT 7.3 7.1  ALBUMIN 3.4* 3.2*   No results for input(s): LIPASE, AMYLASE in the last 168 hours. No results for input(s): AMMONIA in the last 168 hours. Coagulation  Profile: No results for input(s): INR, PROTIME in the last 168 hours. Cardiac Enzymes:  Recent Labs Lab 08/13/15 0240 08/13/15 0908  TROPONINI 0.05* 0.06*   BNP (last 3 results) No results for input(s): PROBNP in the last 8760 hours. HbA1C: No results for input(s): HGBA1C in the last 72 hours. CBG:  Recent Labs Lab 08/16/15 1110 08/16/15 1627 08/16/15 2122 08/17/15 0824 08/17/15 1147  GLUCAP 137* 180* 123* 130* 163*   Lipid Profile: No results for input(s): CHOL, HDL, LDLCALC, TRIG, CHOLHDL, LDLDIRECT in the last 72 hours. Thyroid Function Tests: No results for input(s): TSH, T4TOTAL, FREET4, T3FREE, THYROIDAB in the last 72 hours. Anemia Panel: No results for input(s): VITAMINB12, FOLATE, FERRITIN, TIBC, IRON, RETICCTPCT in the last 72 hours. Urine analysis:    Component Value Date/Time   COLORURINE YELLOW 08/11/2012 1917   APPEARANCEUR CLOUDY* 08/11/2012 1917   LABSPEC 1.013 08/11/2012 1917   PHURINE 7.5 08/11/2012 1917   GLUCOSEU 100* 08/11/2012 1917   HGBUR TRACE* 08/11/2012 1917   BILIRUBINUR NEGATIVE 08/11/2012 1917   KETONESUR NEGATIVE 08/11/2012 1917   PROTEINUR >300* 08/11/2012 1917   UROBILINOGEN 0.2 08/11/2012 1917   NITRITE NEGATIVE 08/11/2012 1917   LEUKOCYTESUR NEGATIVE 08/11/2012 1917   Sepsis Labs: (procalcitonin:4,lacticidven:4)  ) Recent Results (from the past 240 hour(s))  Culture, blood (Routine X 2) w Reflex to ID Panel     Status: None (Preliminary result)   Collection Time: 08/13/15  3:48 AM  Result Value Ref Range Status   Specimen Description BLOOD RIGHT FOREARM  Final   Special Requests IN PEDIATRIC BOTTLE  Final   Culture NO GROWTH 4 DAYS  Final   Report Status PENDING  Incomplete  Culture, blood (Routine X 2) w Reflex to ID Panel     Status: None (Preliminary result)   Collection Time: 08/13/15  3:55 AM  Result Value Ref Range Status   Specimen Description BLOOD RIGHT HAND  Final   Special Requests AEROBIC BOTTLE  ONLY  Final   Culture NO GROWTH 4 DAYS  Final   Report Status PENDING  Incomplete  MRSA PCR Screening  Status: None   Collection Time: 08/13/15  2:23 PM  Result Value Ref Range Status   MRSA by PCR NEGATIVE NEGATIVE Final    Comment:        The GeneXpert MRSA Assay (FDA approved for NASAL specimens only), is one component of a comprehensive MRSA colonization surveillance program. It is not intended to diagnose MRSA infection nor to guide or monitor treatment for MRSA infections.       Radiology Studies: No results found.   Scheduled Meds: . antiseptic oral rinse  7 mL Mouth Rinse BID  . calcium acetate  1,334 mg Oral QAC supper  . gabapentin  100 mg Oral QHS  . heparin  5,000 Units Subcutaneous Q8H  . insulin aspart  0-9 Units Subcutaneous TID WC  . ipratropium-albuterol  3 mL Nebulization BID  . pravastatin  40 mg Oral Daily  . sodium chloride flush  3 mL Intravenous Q12H  . [START ON 08/21/2015] Vitamin D (Ergocalciferol)  50,000 Units Oral Q Wed   Continuous Infusions:    LOS: 4 days   Time Spent in minutes   45 minutes  Jenney Brester D.O. on 08/17/2015 at 11:53 AM  Between 7am to 7pm - Pager - [phone]  After 7pm go to [email or web address] - password [email or web address]  And look for the night coverage person covering for me after hours  [facility] Office  [phone]

## 2015-08-17 NOTE — Progress Notes (Signed)
Discussed and explained discharge instructions, follow up appt given to daugther monica, mentionned of prescriptions sent to [facility] in [place]. Pt going home with daugther via w/c with belongings.

## 2015-08-17 NOTE — Discharge Summary (Addendum)
Physician Discharge Summary  Nicole Cox AVW:[id number] DOB: [birth year] DOA: 08/13/2015  PCP: [name],J STEWART, MD  Admit date: 08/13/2015 Discharge date: 08/17/2015  Time spent: 45 minutes  Recommendations for Outpatient Follow-up:  Patient will be discharged to home with home health services.  Patient will need to follow up with primary care provider within one week of discharge.  Continue dialysis as scheduled. Patient should continue medications as prescribed.  Patient should follow a renal/carb modified diet.   Discharge Diagnoses:  Principal Problem:   Acute respiratory failure with hypoxia (HCC) Active Problems:   ESRD on hemodialysis (HCC)   Hypertensive urgency   Chronic anemia   Acute respiratory failure (HCC)  Discharge Condition: Stable  Diet recommendation: Renal/Carb modified  Filed Weights   08/17/15 0330 08/17/15 1330 08/17/15 1630  Weight: 64.093 kg (141 lb 4.8 oz) 65.5 kg (144 lb 6.4 oz) 65.3 kg (143 lb 15.4 oz)    History of present illness:  On 08/13/2015 by Dr. Midge MiniumArshad Kakrakandy Nicole DecantLizzie Cox is a 82 y.o. female with medical history significant of ESRD on hemodialysis on Tuesday Thursday and Saturday, hypertension, COPD, CAD, chronic anemia presents to the ER because of increasing shortness of breath. Patient states she has been having shortness of breath off and on for the last 1 week. Patient was admitted in the beginning of the last month at [facility] for pneumonia. Chest x-ray today shows right-sided infiltrates concerning for pneumonia. Patient states she also has been having off-and-on pleuritic-type of chest pain when she coughs the last time she had it was 3 days ago. Patient states she did not miss her dialysis. Patient's blood pressure is also found to be elevated with systolic more than 180. Patient is being admitted for acute respiratory failure probably from pneumonia and also some contribution from fluid overload.  Hospital Course:  Acute  respiratory failure with hypoxia/pulmonary edema in patient with ESRD -Patient did require BiPAP upon admission -initially placed on vancomycin and cefepime for possible pneumonia- discontinued -Continue nebulizers -Repeat chest x-ray 5/31: B/L diffuse interstitial densities- concerning for edema or possibly inflammation.  -likely volume related instead of infection. Currently WBC trending downward, afebrile.  -Improved with HD -Will discharge patient with neb machine and meds, rescue inhaler  Acute encephalopathy -Resolved, Possibly due to HD and hypotension (patient perked up with 500cc bolus given on 08/16/2015) -patient was AAOx3, this morning. Received a call that patient became unresponsive   Hypertensive urgency -Resolved -Patient had low normal BP, spoke with Dr. Arlean HoppingSchertz, recommended patient take only clonidine. 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 POA went back and forth regarding SNF and refused. Patient will be discharged with home health services, PT, OT, Aide, RN.  Goals of care -Palliative care consulted and appreciate  Consultants Nephrology Palliative care  Procedures  None  Discharge Exam: Filed Vitals:   08/17/15 1630 08/17/15 1717  BP: 151/70 163/53  Pulse: 94 87  Temp: 98.7 F (37.1 C) 98.4 F (36.9 C)  Resp: 15 14   Exam  General: Well developed,  well nourished, NAD  HEENT: NCAT, mucous membranes moist.   Cardiovascular: S1 S2  auscultated, RRR, no murmur  Respiratory: Clear to ausculation  Abdomen: Soft, nontender, nondistended, + bowel sounds  Extremities: warm dry without cyanosis clubbing or edema  Neuro: AAOx3 (self, place, time), nonfocal  Psych: Appropriate mood and affect, pleasant  Discharge Instructions      Discharge Instructions    Discharge instructions    Complete by:  As directed   Patient will be discharged to home with home health services.  Patient will need to follow up with primary care provider within one week of discharge.  Continue dialysis as scheduled. Patient should continue medications as prescribed.  Patient should follow a renal/carb modified diet.            Medication List    STOP taking these medications        amLODipine 5 MG tablet  Commonly known as:  NORVASC      TAKE these medications        acetaminophen 500 MG tablet  Commonly known as:  TYLENOL  Take 500 mg by mouth every 6 (six) hours as needed for pain.     albuterol 108 (90 Base) MCG/ACT inhaler  Commonly known as:  PROVENTIL HFA;VENTOLIN HFA  Inhale 2 puffs into the lungs every 6 (six) hours as needed for wheezing or shortness of breath.     calcium acetate 667 MG capsule  Commonly known as:  PHOSLO  Take 1,334 mg by mouth daily.     cloNIDine 0.2 MG tablet  Commonly known as:  CATAPRES  Take 0.2 mg by mouth 2 (two) times daily.     diclofenac sodium 1 % Gel  Commonly known as:  VOLTAREN  Apply 2 g topically 4 (four) times daily.     feeding supplement (NEPRO CARB STEADY) Liqd  Take 237 mLs by mouth 3 (three) times daily as needed (Supplement).     gabapentin 100 MG capsule  Commonly known as:  NEURONTIN  Take 1 capsule (100 mg total) by mouth at bedtime.     insulin aspart 100 UNIT/ML injection  Commonly known as:  novoLOG  Inject 6 Units into the skin 3 (three) times daily with meals.     insulin glargine 100 UNIT/ML injection  Commonly known as:  LANTUS  Inject 16 Units into  the skin at bedtime.     oxyCODONE-acetaminophen 5-325 MG tablet  Commonly known as:  PERCOCET/ROXICET  Take 1-2 tablets by mouth every 6 (six) hours as needed.     pravastatin 40 MG tablet  Commonly known as:  PRAVACHOL  Take 40 mg by mouth daily.     PRESCRIPTION MEDICATION  Inhale 2 puffs into the lungs daily as needed (inhailer).     traMADol 50 MG tablet  Commonly known as:  ULTRAM  Take 50 mg by mouth every 8 (eight) hours as needed for pain.     Vitamin D (Ergocalciferol) 50000 units Caps capsule  Commonly known as:  DRISDOL  Take 50,000 Units by mouth daily.       No Known Allergies Follow-up Information    Follow up with Joana Reamer, MD. Schedule an appointment as soon as possible for a visit in 1 week.   Specialty:  Obstetrics and Gynecology   Why:  Hospital follow up   Contact information:   [address] [place], [address] [place] [address] [phone]        The results of significant  diagnostics from this hospitalization (including imaging, microbiology, ancillary and laboratory) are listed below for reference.    Significant Diagnostic Studies: Dg Chest 2 View  08/13/2015  CLINICAL DATA:  Acute onset of cough and shortness of breath. Initial encounter. EXAM: CHEST  2 VIEW COMPARISON:  Chest radiograph performed 08/14/2012 FINDINGS: The lungs are well-aerated. Diffuse right-sided airspace opacification raises concern for pneumonia. Asymmetric interstitial edema might have a similar appearance. Underlying vascular congestion is noted. There is no evidence of pleural effusion or pneumothorax. The heart is mildly enlarged. No acute osseous abnormalities are seen. IMPRESSION: 1. Diffuse right-sided airspace opacification raises concern for pneumonia. Asymmetric interstitial edema might have a similar appearance. 2. Underlying vascular congestion and mild cardiomegaly. Electronically Signed   By: Roanna Raider M.D.   On: 08/13/2015 02:53   Dg Chest Port 1  View  08/14/2015  CLINICAL DATA:  Pneumonia, edema. EXAM: PORTABLE CHEST 1 VIEW COMPARISON:  Aug 13, 2015. FINDINGS: Stable cardiomediastinal silhouette. Bilateral diffuse interstitial densities are noted concerning for edema or possibly inflammation. Mild right basilar subsegmental atelectasis or inflammation is noted. No pneumothorax or significant pleural effusion is noted. Bony thorax is unremarkable. IMPRESSION: Bilateral diffuse interstitial densities are noted concerning for edema or possibly inflammation. Mild right basilar subsegmental atelectasis or inflammation is noted as well. Electronically Signed   By: Lupita Raider, M.D.   On: 08/14/2015 10:59    Microbiology: Recent Results (from the past 240 hour(s))  Culture, blood (Routine X 2) w Reflex to ID Panel     Status: None (Preliminary result)   Collection Time: 08/13/15  3:48 AM  Result Value Ref Range Status   Specimen Description BLOOD RIGHT FOREARM  Final   Special Requests IN PEDIATRIC BOTTLE  Final   Culture NO GROWTH 4 DAYS  Final   Report Status PENDING  Incomplete  Culture, blood (Routine X 2) w Reflex to ID Panel     Status: None (Preliminary result)   Collection Time: 08/13/15  3:55 AM  Result Value Ref Range Status   Specimen Description BLOOD RIGHT HAND  Final   Special Requests AEROBIC BOTTLE ONLY  Final   Culture NO GROWTH 4 DAYS  Final   Report Status PENDING  Incomplete  MRSA PCR Screening     Status: None   Collection Time: 08/13/15  2:23 PM  Result Value Ref Range Status   MRSA by PCR NEGATIVE NEGATIVE Final    Comment:        The GeneXpert MRSA Assay (FDA approved for NASAL specimens only), is one component of a comprehensive MRSA colonization surveillance program. It is not intended to diagnose MRSA infection nor to guide or monitor treatment for MRSA infections.      Labs: Basic Metabolic Panel:  Recent Labs Lab 08/13/15 0908 08/14/15 0803 08/15/15 0530 08/16/15 0542  08/17/15 0733  NA 140 133* 133* 132* 133*  K 6.9* 5.5* 4.6 4.3 4.1  CL 106 97* 93* 93* 94*  CO2 21* GLUCOSE 109* 66 56* 83 127*  BUN 67* 33* 17 17 40*  CREATININE 10.52* 7.16* 5.02* 4.79* 7.44*  CALCIUM 8.5* 8.8* 9.0 9.0 8.5*   Liver Function Tests:  Recent Labs Lab 08/13/15 0240 08/13/15 0908  AST 29 24  ALT 26 24  ALKPHOS 148* 120  BILITOT 0.7 0.7  PROT 7.3 7.1  ALBUMIN 3.4* 3.2*   No results for input(s): LIPASE, AMYLASE in the last 168 hours. No results for input(s):  AMMONIA in the last 168 hours. CBC:  Recent Labs Lab 08/13/15 0240 08/13/15 0908 08/14/15 0803 08/15/15 0530 08/16/15 0542 08/17/15 0733  WBC 10.0 10.9* 9.9 8.2 9.4 8.1  NEUTROABS 6.1 8.2*  --   --   --   --   HGB 9.6* 9.3* 8.6* 8.9* 9.3* 9.1*  HCT 31.5* 30.5* 28.5* 29.6* 30.9* 29.6*  MCV 101.0* 100.0 101.1* 99.0 100.3* 96.1  PLT 279 252 205 185 169 192   Cardiac Enzymes:  Recent Labs Lab 08/13/15 0240 08/13/15 0908  TROPONINI 0.05* 0.06*   BNP: BNP (last 3 results) No results for input(s): BNP in the last 8760 hours.  ProBNP (last 3 results) No results for input(s): PROBNP in the last 8760 hours.  CBG:  Recent Labs Lab 08/16/15 2122 08/17/15 0824 08/17/15 1147 08/17/15 1229 08/17/15 1719  GLUCAP 123* 130* 163* 157* 130*       Signed:  Minta Fair  [facility] 08/17/2015, 5:48 PM

## 2015-08-17 NOTE — Progress Notes (Signed)
CM met with pt and called POA Ms. Johnnye Sima [phone] and offered choice of home health agency.  Johnnye Sima chooses [facility] to render HHPT/OT/RN/Aide/SW.  CM has requested DME orders for nebulizer machine and hospital bed.  CM will arrange DME delivery and [facility] services tomorrow as agency is closed at this time.  Hatcher declines need for ambulance transport home and states family will provide transport home this evening.  No other CM needs were communicated.

## 2015-08-17 NOTE — Progress Notes (Signed)
[facility] Progress Note   Subjective: lethargic  Filed Vitals:   08/17/15 0330 08/17/15 0727 08/17/15 0822 08/17/15 1118  BP: 152/48  152/62 166/47  Pulse: 45  75 85  Temp: 97.6 F (36.4 C)  98.2 F (36.8 C) 99.1 F (37.3 C)  TempSrc: Axillary  Oral Oral  Resp: Height:  (1.575 m)     Weight: 64.093 kg (141 lb 4.8 oz)     SpO2: 94% 98% 98% 97%    Inpatient medications: . antiseptic oral rinse  7 mL Mouth Rinse BID  . calcium acetate  1,334 mg Oral QAC supper  . gabapentin  100 mg Oral QHS  . heparin  5,000 Units Subcutaneous Q8H  . insulin aspart  0-9 Units Subcutaneous TID WC  . ipratropium-albuterol  3 mL Nebulization BID  . pravastatin  40 mg Oral Daily  . sodium chloride flush  3 mL Intravenous Q12H  . [START ON 08/21/2015] Vitamin D (Ergocalciferol)  50,000 Units Oral Q Wed     sodium chloride, sodium chloride, sodium chloride, sodium chloride, acetaminophen **OR** acetaminophen, alteplase, camphor-menthol **AND** hydrOXYzine, cloNIDine, feeding supplement (NEPRO CARB STEADY), heparin, heparin, heparin, [START ON 08/18/2015] heparin, ipratropium-albuterol, lidocaine (PF), lidocaine-prilocaine, ondansetron **OR** ondansetron (ZOFRAN) IV, pentafluoroprop-tetrafluoroeth, traMADol  Exam: No distress No jvd or bruits Chest clear bilat RRR no MRG Abd soft ntnd no mass or ascites +bs Ext no LE edema Neuro is alert, Ox 3 , nf  CXR 5/30 - diffuse R-sided infiltrates, edema vs PNA CXR 5/31 - resolution of R dense infiltrates, residual IS edema mild-mod  Dialysis: TTS [place] on [address] 3h  66.5kg (146lb)   3K/2.0 bath  LUA AVG  Hep 3900 then 500/hr   Assessment: 1. Acute/ chron resp failure/acute pulm edema - resolved 2. Hx COPD home O2- interestingly looking back in Care Everywhere she has baseline chronic R mid and lower lung scarring/pleural plaquing which looks a lot like "CHF" and as a result we pulled too much fluid off w HD and  her BP dropped and she became unresponsive; BP and MS improved w NS bolus. Keep this in mind in the future when looking at her CXR's.  3. HTN norvasc/ clonidine, bp's down , will reduce BP meds 4. DM on insulin 5. Hyperkalemia- better 6. AMS- resolved w fluid bolus 7. Debiltiy - may need SNF  Plan - resuming clonidine today, HD today, no fluid off. Possible SNFP.     Vinson Moselle MD [facility] pager [phone]    cell [phone] 08/17/2015, 11:31 AM    Recent Labs Lab 08/15/15 0530 08/16/15 0542 08/17/15 0733  NA 133* 132* 133*  K 4.6 4.3 4.1  CL 93* 93* 94*  CO2 GLUCOSE 56* 83 127*  BUN 17 17 40*  CREATININE 5.02* 4.79* 7.44*  CALCIUM 9.0 9.0 8.5*    Recent Labs Lab 08/13/15 0240 08/13/15 0908  AST 29 24  ALT 26 24  ALKPHOS 148* 120  BILITOT 0.7 0.7  PROT 7.3 7.1  ALBUMIN 3.4* 3.2*    Recent Labs Lab 08/13/15 0240 08/13/15 0908  08/15/15 0530 08/16/15 0542 08/17/15 0733  WBC 10.0 10.9*  < > 8.2 9.4 8.1  NEUTROABS 6.1 8.2*  --   --   --   --   HGB 9.6* 9.3*  < > 8.9* 9.3* 9.1*  HCT 31.5* 30.5*  < > 29.6* 30.9* 29.6*  MCV 101.0* 100.0  < >  99.0 100.3* 96.1  PLT 279 252  < > 185 169 192  < > = values in this interval not displayed.

## 2015-08-17 NOTE — Progress Notes (Signed)
[facility] make aware of discharge with homehealth. She stated that she would call granddaugther who is POA.  At later time POA called me back. Concerned of grandmother going home due to lack of mobility. I spoke with Dr. Catha GosselinMikhail, who called POA back. Family decided to take pt home with homehealth instead of pt going to med surg floor and then possible SNF for rehab.

## 2015-08-17 NOTE — Discharge Instructions (Signed)
Respiratory failure is when your lungs are not working well and your breathing (respiratory) system fails. When respiratory failure occurs, it is difficult for your lungs to get enough oxygen, get rid of carbon dioxide, or both. Respiratory failure can be life threatening.  °Respiratory failure can be acute or chronic. Acute respiratory failure is sudden, severe, and requires emergency medical treatment. Chronic respiratory failure is less severe, happens over time, and requires ongoing treatment.  °WHAT ARE THE CAUSES OF ACUTE RESPIRATORY FAILURE?  °Any problem affecting the heart or lungs can cause acute respiratory failure. Some of these causes include the following: °· Chronic bronchitis and emphysema (COPD).   °· Blood clot going to a lung (pulmonary embolism).   °· Having water in the lungs caused by heart failure, lung injury, or infection (pulmonary edema).   °· Collapsed lung (pneumothorax).   °· Pneumonia.   °· Pulmonary fibrosis.   °· Obesity.   °· Asthma.   °· Heart failure.   °· Any type of trauma to the chest that can make breathing difficult.   °· Nerve or muscle diseases making chest movements difficult. °HOW WILL MY ACUTE RESPIRATORY FAILURE BE TREATED?  °Treatment of acute respiratory failure depends on the cause of the respiratory failure. Usually, you will stay in the intensive care unit so your breathing can be watched closely. Treatment can include the following: °· Oxygen. Oxygen can be delivered through the following: °¨ Nasal cannula. This is small tubing that goes in your nose to give you oxygen. °¨ Face mask. A face mask covers your nose and mouth to give you oxygen. °· Medicine. Different medicines can be given to help with breathing. These can include: °¨ Nebulizers. Nebulizers deliver medicines to open the air passages (bronchodilators). These medicines help to open or relax the airways in the lungs so you can breathe better. They can also help loosen mucus from your  lungs. °¨ Diuretics. Diuretic medicines can help you breathe better by getting rid of extra water in your body. °¨ Steroids. Steroid medicines can help decrease swelling (inflammation) in your lungs. °¨ Antibiotics. °· Chest tube. If you have a collapsed lung (pneumothorax), a chest tube is placed to help reinflate the lung. °· Noninvasive positive pressure ventilation (NPPV). This is a tight-fitting mask that goes over your nose and mouth. The mask has tubing that is attached to a machine. The machine blows air into the tubing, which helps to keep the tiny air sacs (alveoli) in your lungs open. This machine allows you to breathe on your own. °· Ventilator. A ventilator is a breathing machine. When on a ventilator, a breathing tube is put into the lungs. A ventilator is used when you can no longer breathe well enough on your own. You may have low oxygen levels or high carbon dioxide (CO2) levels in your blood. When you are on a ventilator, sedation and pain medicines are given to make you sleep so your lungs can heal. °SEEK IMMEDIATE MEDICAL CARE IF: °· You have shortness of breath (dyspnea) with or without activity. °· You have rapid breathing (tachypnea). °· You are wheezing. °· You are unable to say more than a few words without having to catch your breath. °· You find it very difficult to function normally. °· You have a fast heart rate. °· You have a bluish color to your finger or toe nail beds. °· You have confusion or drowsiness or both. °  °This information is not intended to replace advice given to you by your health care provider. Make sure you discuss   any questions you have with your health care provider. °  °Document Released: 03/07/2013 Document Revised: 11/21/2014 Document Reviewed: 03/07/2013 °Elsevier Interactive Patient Education ©2016 [employer] ° °

## 2015-08-18 LAB — CULTURE, BLOOD (ROUTINE X 2)
Culture: NO GROWTH
Culture: NO GROWTH

## 2015-12-12 ENCOUNTER — Inpatient Hospital Stay (HOSPITAL_COMMUNITY)
Admission: EM | Admit: 2015-12-12 | Discharge: 2015-12-16 | DRG: 189 | Disposition: A | Discharge status: Home / Self Care | Payer: Medicare HMO | Attending: Internal Medicine | Admitting: Internal Medicine

## 2015-12-12 ENCOUNTER — Emergency Department (HOSPITAL_COMMUNITY): Payer: Medicare HMO

## 2015-12-12 ENCOUNTER — Encounter (HOSPITAL_COMMUNITY): Payer: Self-pay | Admitting: Emergency Medicine

## 2015-12-12 DIAGNOSIS — J9601 Acute respiratory failure with hypoxia: Secondary | ICD-10-CM

## 2015-12-12 DIAGNOSIS — N186 End stage renal disease: Secondary | ICD-10-CM | POA: Diagnosis not present

## 2015-12-12 DIAGNOSIS — J441 Chronic obstructive pulmonary disease with (acute) exacerbation: Secondary | ICD-10-CM | POA: Diagnosis present

## 2015-12-12 DIAGNOSIS — R0602 Shortness of breath: Secondary | ICD-10-CM

## 2015-12-12 DIAGNOSIS — Z9981 Dependence on supplemental oxygen: Secondary | ICD-10-CM

## 2015-12-12 DIAGNOSIS — D631 Anemia in chronic kidney disease: Secondary | ICD-10-CM | POA: Diagnosis present

## 2015-12-12 DIAGNOSIS — I132 Hypertensive heart and chronic kidney disease with heart failure and with stage 5 chronic kidney disease, or end stage renal disease: Secondary | ICD-10-CM | POA: Diagnosis present

## 2015-12-12 DIAGNOSIS — I248 Other forms of acute ischemic heart disease: Secondary | ICD-10-CM | POA: Diagnosis present

## 2015-12-12 DIAGNOSIS — J449 Chronic obstructive pulmonary disease, unspecified: Secondary | ICD-10-CM

## 2015-12-12 DIAGNOSIS — E118 Type 2 diabetes mellitus with unspecified complications: Secondary | ICD-10-CM

## 2015-12-12 DIAGNOSIS — E669 Obesity, unspecified: Secondary | ICD-10-CM | POA: Diagnosis present

## 2015-12-12 DIAGNOSIS — Z794 Long term (current) use of insulin: Secondary | ICD-10-CM | POA: Diagnosis not present

## 2015-12-12 DIAGNOSIS — J9621 Acute and chronic respiratory failure with hypoxia: Secondary | ICD-10-CM | POA: Diagnosis present

## 2015-12-12 DIAGNOSIS — Z992 Dependence on renal dialysis: Secondary | ICD-10-CM | POA: Diagnosis not present

## 2015-12-12 DIAGNOSIS — I10 Essential (primary) hypertension: Secondary | ICD-10-CM

## 2015-12-12 DIAGNOSIS — I2510 Atherosclerotic heart disease of native coronary artery without angina pectoris: Secondary | ICD-10-CM | POA: Diagnosis present

## 2015-12-12 DIAGNOSIS — Z6827 Body mass index (BMI) 27.0-27.9, adult: Secondary | ICD-10-CM

## 2015-12-12 DIAGNOSIS — I5031 Acute diastolic (congestive) heart failure: Secondary | ICD-10-CM | POA: Diagnosis not present

## 2015-12-12 DIAGNOSIS — J9691 Respiratory failure, unspecified with hypoxia: Secondary | ICD-10-CM | POA: Insufficient documentation

## 2015-12-12 DIAGNOSIS — I5033 Acute on chronic diastolic (congestive) heart failure: Secondary | ICD-10-CM | POA: Diagnosis present

## 2015-12-12 DIAGNOSIS — E8770 Fluid overload, unspecified: Secondary | ICD-10-CM | POA: Diagnosis present

## 2015-12-12 DIAGNOSIS — E1129 Type 2 diabetes mellitus with other diabetic kidney complication: Secondary | ICD-10-CM

## 2015-12-12 LAB — I-STAT ARTERIAL BLOOD GAS, ED
ACID-BASE EXCESS: 5 mmol/L — AB (ref 0.0–2.0)
BICARBONATE: 29.2 mmol/L — AB (ref 20.0–28.0)
O2 SAT: 80 %
Patient temperature: 98
TCO2: 30 mmol/L (ref 0–100)
pCO2 arterial: 38.6 mmHg (ref 32.0–48.0)
pH, Arterial: 7.485 — ABNORMAL HIGH (ref 7.350–7.450)
pO2, Arterial: 40 mmHg — CL (ref 83.0–108.0)

## 2015-12-12 LAB — CBC
HCT: 32.9 % — ABNORMAL LOW (ref 36.0–46.0)
Hemoglobin: 9.9 g/dL — ABNORMAL LOW (ref 12.0–15.0)
MCH: 29.6 pg (ref 26.0–34.0)
MCHC: 30.1 g/dL (ref 30.0–36.0)
MCV: 98.5 fL (ref 78.0–100.0)
PLATELETS: 232 10*3/uL (ref 150–400)
RBC: 3.34 MIL/uL — ABNORMAL LOW (ref 3.87–5.11)
RDW: 17.4 % — AB (ref 11.5–15.5)
WBC: 8.5 10*3/uL (ref 4.0–10.5)

## 2015-12-12 LAB — GLUCOSE, CAPILLARY
GLUCOSE-CAPILLARY: 59 mg/dL — AB (ref 65–99)
Glucose-Capillary: 80 mg/dL (ref 65–99)
Glucose-Capillary: 87 mg/dL (ref 65–99)
Glucose-Capillary: 106 mg/dL — ABNORMAL HIGH (ref 65–99)

## 2015-12-12 LAB — I-STAT TROPONIN, ED: TROPONIN I, POC: 0.21 ng/mL — AB (ref 0.00–0.08)

## 2015-12-12 LAB — BASIC METABOLIC PANEL
Anion gap: 12 (ref 5–15)
BUN: 29 mg/dL — AB (ref 6–20)
CHLORIDE: 102 mmol/L (ref 101–111)
CO2: 27 mmol/L (ref 22–32)
CREATININE: 7.96 mg/dL — AB (ref 0.44–1.00)
Calcium: 9.1 mg/dL (ref 8.9–10.3)
GFR calc Af Amer: 5 mL/min — ABNORMAL LOW (ref >60)
GFR calc non Af Amer: 4 mL/min — ABNORMAL LOW (ref >60)
GLUCOSE: 86 mg/dL (ref 65–99)
Potassium: 4.6 mmol/L (ref 3.5–5.1)
Sodium: 141 mmol/L (ref 135–145)

## 2015-12-12 LAB — TROPONIN I
Troponin I: 0.24 ng/mL (ref <0.03)
Troponin I: 0.23 ng/mL (ref <0.03)
Troponin I: 0.22 ng/mL (ref <0.03)

## 2015-12-12 LAB — BRAIN NATRIURETIC PEPTIDE: B NATRIURETIC PEPTIDE 5: 569.8 pg/mL — AB (ref 0.0–100.0)

## 2015-12-12 LAB — MRSA PCR SCREENING: MRSA by PCR: NEGATIVE

## 2015-12-12 MED ORDER — LIDOCAINE-PRILOCAINE 2.5-2.5 % EX CREA: 1.0000 "application " | TOPICAL_CREAM | CUTANEOUS | Status: DC | PRN

## 2015-12-12 MED ORDER — OXYCODONE-ACETAMINOPHEN 5-325 MG PO TABS
1.0000 | ORAL_TABLET | Freq: Four times a day (QID) | ORAL | Status: DC | PRN
  Administered 2015-12-12: 1 via ORAL

## 2015-12-12 MED ORDER — ONDANSETRON HCL 4 MG/2ML IJ SOLN: 4.0000 mg | Freq: Four times a day (QID) | INTRAMUSCULAR | Status: DC | PRN

## 2015-12-12 MED ORDER — VITAMIN D (ERGOCALCIFEROL) 1.25 MG (50000 UNIT) PO CAPS: 50000.0000 [IU] | ORAL_CAPSULE | Freq: Every day | ORAL | Status: DC

## 2015-12-12 MED ORDER — OXYCODONE-ACETAMINOPHEN 5-325 MG PO TABS
ORAL_TABLET | ORAL | Status: AC
  Filled 2015-12-12: qty 2

## 2015-12-12 MED ORDER — IPRATROPIUM BROMIDE 0.02 % IN SOLN
0.5000 mg | RESPIRATORY_TRACT | Status: DC | PRN
Start: 2015-12-12 — End: 2015-12-16
  Administered 2015-12-14: 0.5 mg via RESPIRATORY_TRACT
  Filled 2015-12-12: qty 2.5

## 2015-12-12 MED ORDER — HYDRALAZINE HCL 20 MG/ML IJ SOLN: 10.0000 mg | Freq: Four times a day (QID) | INTRAMUSCULAR | Status: DC | PRN

## 2015-12-12 MED ORDER — ACETAMINOPHEN 500 MG PO TABS
500.0000 mg | ORAL_TABLET | Freq: Four times a day (QID) | ORAL | Status: DC | PRN
  Administered 2015-12-15 (×2): 500 mg via ORAL
  Filled 2015-12-12 (×2): qty 1

## 2015-12-12 MED ORDER — CALCIUM ACETATE (PHOS BINDER) 667 MG PO CAPS
2668.0000 mg | ORAL_CAPSULE | Freq: Three times a day (TID) | ORAL | Status: DC
  Administered 2015-12-12 – 2015-12-16 (×11): 2668 mg via ORAL
  Filled 2015-12-12 (×11): qty 4

## 2015-12-12 MED ORDER — ALBUTEROL SULFATE (2.5 MG/3ML) 0.083% IN NEBU
5.0000 mg | INHALATION_SOLUTION | Freq: Once | RESPIRATORY_TRACT | Status: AC
  Administered 2015-12-12: 2.5 mg via RESPIRATORY_TRACT
  Filled 2015-12-12: qty 6

## 2015-12-12 MED ORDER — PENTAFLUOROPROP-TETRAFLUOROETH EX AERO: 1.0000 "application " | INHALATION_SPRAY | CUTANEOUS | Status: DC | PRN

## 2015-12-12 MED ORDER — LIDOCAINE HCL (PF) 1 % IJ SOLN: 5.0000 mL | INTRAMUSCULAR | Status: DC | PRN

## 2015-12-12 MED ORDER — GABAPENTIN 100 MG PO CAPS
100.0000 mg | ORAL_CAPSULE | Freq: Every day | ORAL | Status: DC
  Administered 2015-12-12 – 2015-12-15 (×3): 100 mg via ORAL
  Filled 2015-12-12 (×4): qty 1

## 2015-12-12 MED ORDER — PRAVASTATIN SODIUM 40 MG PO TABS
40.0000 mg | ORAL_TABLET | Freq: Every day | ORAL | Status: DC
  Administered 2015-12-12 – 2015-12-16 (×5): 40 mg via ORAL
  Filled 2015-12-12 (×5): qty 1

## 2015-12-12 MED ORDER — ALTEPLASE 2 MG IJ SOLR: 2.0000 mg | Freq: Once | INTRAMUSCULAR | Status: DC | PRN

## 2015-12-12 MED ORDER — METHYLPREDNISOLONE SODIUM SUCC 125 MG IJ SOLR: 125.0000 mg | INTRAMUSCULAR | Status: AC

## 2015-12-12 MED ORDER — HEPARIN SODIUM (PORCINE) 1000 UNIT/ML DIALYSIS: 2000.0000 [IU] | Freq: Once | INTRAMUSCULAR | Status: DC

## 2015-12-12 MED ORDER — INSULIN ASPART 100 UNIT/ML ~~LOC~~ SOLN
0.0000 [IU] | Freq: Three times a day (TID) | SUBCUTANEOUS | Status: DC
  Administered 2015-12-13: 1 [IU] via SUBCUTANEOUS
  Administered 2015-12-14: 3 [IU] via SUBCUTANEOUS
  Administered 2015-12-15: 2 [IU] via SUBCUTANEOUS
  Administered 2015-12-15: 3 [IU] via SUBCUTANEOUS
  Administered 2015-12-15 – 2015-12-16 (×2): 2 [IU] via SUBCUTANEOUS
  Administered 2015-12-16: 3 [IU] via SUBCUTANEOUS

## 2015-12-12 MED ORDER — HEPARIN SODIUM (PORCINE) 1000 UNIT/ML DIALYSIS: 1000.0000 [IU] | INTRAMUSCULAR | Status: DC | PRN

## 2015-12-12 MED ORDER — GUAIFENESIN ER 600 MG PO TB12
600.0000 mg | ORAL_TABLET | Freq: Two times a day (BID) | ORAL | Status: DC
  Administered 2015-12-12 – 2015-12-16 (×9): 600 mg via ORAL
  Filled 2015-12-12 (×9): qty 1

## 2015-12-12 MED ORDER — SODIUM CHLORIDE 0.9 % IV SOLN: 100.0000 mL | INTRAVENOUS | Status: DC | PRN

## 2015-12-12 MED ORDER — DICLOFENAC SODIUM 1 % TD GEL
2.0000 g | Freq: Four times a day (QID) | TRANSDERMAL | Status: DC
  Administered 2015-12-16: 2 g via TOPICAL
  Filled 2015-12-12 (×2): qty 100

## 2015-12-12 MED ORDER — HEPARIN SODIUM (PORCINE) 5000 UNIT/ML IJ SOLN
5000.0000 [IU] | Freq: Three times a day (TID) | INTRAMUSCULAR | Status: DC
  Administered 2015-12-12 – 2015-12-16 (×12): 5000 [IU] via SUBCUTANEOUS
  Filled 2015-12-12 (×12): qty 1

## 2015-12-12 MED ORDER — ALBUTEROL SULFATE (2.5 MG/3ML) 0.083% IN NEBU
2.5000 mg | INHALATION_SOLUTION | RESPIRATORY_TRACT | Status: DC | PRN
  Administered 2015-12-14: 2.5 mg via RESPIRATORY_TRACT
  Filled 2015-12-12: qty 3

## 2015-12-12 MED ORDER — VITAMIN D 1000 UNITS PO TABS
1000.0000 [IU] | ORAL_TABLET | Freq: Every day | ORAL | Status: DC
  Administered 2015-12-12 – 2015-12-13 (×2): 1000 [IU] via ORAL
  Filled 2015-12-12 (×2): qty 1

## 2015-12-12 MED ORDER — HYDRALAZINE HCL 20 MG/ML IJ SOLN
10.0000 mg | Freq: Four times a day (QID) | INTRAMUSCULAR | Status: AC | PRN
  Administered 2015-12-13 – 2015-12-14 (×2): 10 mg via INTRAVENOUS
  Filled 2015-12-12 (×2): qty 1

## 2015-12-12 MED ORDER — ONDANSETRON HCL 4 MG PO TABS: 4.0000 mg | ORAL_TABLET | Freq: Four times a day (QID) | ORAL | Status: DC | PRN

## 2015-12-12 MED ORDER — INSULIN GLARGINE 100 UNIT/ML ~~LOC~~ SOLN
16.0000 [IU] | Freq: Every day | SUBCUTANEOUS | Status: DC
  Administered 2015-12-12 – 2015-12-15 (×4): 16 [IU] via SUBCUTANEOUS
  Filled 2015-12-12 (×6): qty 0.16

## 2015-12-12 MED ORDER — CALCIUM ACETATE (PHOS BINDER) 667 MG PO CAPS
2668.0000 mg | ORAL_CAPSULE | Freq: Every day | ORAL | Status: DC
Start: 2015-12-12 — End: 2015-12-12

## 2015-12-12 NOTE — ED Notes (Signed)
RT at bedside for CPAP placement

## 2015-12-12 NOTE — ED Triage Notes (Signed)
[facility] per EMS they have been to her home x 2 tonight. Pt finished her own neb then was given another alb/atr tx enroute. Audible rhonchi noted. Pt speaking in short sentences.

## 2015-12-12 NOTE — ED Notes (Signed)
repaged nephrology

## 2015-12-12 NOTE — ED Triage Notes (Signed)
Pt states she did neb tx x 3 at home

## 2015-12-12 NOTE — Care Management Note (Addendum)
Case Management Note  Patient Details  Name: Nicole Cox MRN: [id number] Date of Birth: April 09, 1931  Subjective/Objective:  Presents with acute on chronic resp failure with hypoxia, htn, dm,  Copd, esrd.  Patient was very sleepy today when NCM tried to talk with her.  NCM will come back tomorrow to speak with patient.           9/29- NCM spoke with patient she lives with her spouse and her daughter and grandson.  She has a rolling walker and a bsc and a shower chair at home and home oxygen with [facility] 2 liters.  Her daughter works from 7:30- 7:30 [facility].   PT  eval has been ordered.  NCM will cont to follow for dc needs.       Action/Plan:   Expected Discharge Date:                  Expected Discharge Plan:  Home w Home Health Services  In-House Referral:     Discharge planning Services  CM Consult  Post Acute Care Choice:    Choice offered to:     DME Arranged:    DME Agency:     HH Arranged:    HH Agency:     Status of Service:  In process, will continue to follow  If discussed at Long Length of Stay Meetings, dates discussed:    Additional Comments:  Nicole Cox, Nicole Sharber Clinton, RN 12/12/2015, 5:33 PM

## 2015-12-12 NOTE — ED Notes (Signed)
Admit MD at bedside

## 2015-12-12 NOTE — ED Notes (Signed)
RT called for tx and ABG

## 2015-12-12 NOTE — Progress Notes (Signed)
CRITICAL VALUE ALERT  Critical value received:  Troponin 0.24  Date of notification:  12/12/15  Time of notification:  0810  Critical value read back:yes  Nurse who received alert: Arman Bogusorothy Gladine Plude Rn  MD notified (1st page): Dr Vanessa BarbaraZamora via text page  Time of first page:  [phone]

## 2015-12-12 NOTE — ED Notes (Signed)
RT at bedside.

## 2015-12-12 NOTE — H&P (Signed)
History and Physical    Nicole Cox ZOX:[id number] DOB: [birth year] DOA: 12/12/2015  Referring MD/NP/PA:Dr. Blinda LeatherwoodPollina PCP: [name],J STEWART, MD  Patient coming from: Home  Chief Complaint: Shortness of breath  HPI: Nicole Cox is a 82 y.o. female with medical history significant of ESRD on HD(T/TH/Sat), chronic respiratory failure due to COPD, oxygen dependent on 2L, CHF, HTN, chronic anemia, DM2; who presents with a three-day history of progressively worsening shortness of breath. Associated symptoms include nonproductive cough  Denies any fever or chills. Patient had called EMS earlier yesterday, but was not transported to the hospital as she felt better after nebulized treatment. However, patient breathing subsequently declined and despite giving herself 3 home nebulized treatments was not feeling better. Per review of records, the patient was just [facility] from [facility]-8/27 for acute COPD exacerbation treated with azithromycin and steroid taper. [facility], and [facility] with similar symptoms thought to be related to fluid overload. Discharge weight previously was 141-143 back in 08/2015.  ED Course: Upon admission to the emergency department patient was seen to be afebrile, HR up to 110's, respirations up to 27, blood pressure as high as 173/68, O2 saturations as low as 89% on nasal cannula oxygen, and weight noted to be 152 pounds. Hemoglobin 9.9, BUN 29, creatinine 7.96, and BNP 569.8. Otherwise, lab work appears similar to patient's baseline when compared to previous blood work.  ABG pH 7.485, PCO2 38.6, PO2 40.  Chest x-ray showed cardiomegaly with worsening congestive size and interval element of bilateral pleural effusions. Patient was switched to BiPAP with decreased respiratory distress and nephrology consulted by ED physician for the patient received dialysis in a.m. TRH to admit and  transferring patient to stepdown for need of BiPAP.  Review of Systems: As per HPI otherwise 10 point review of systems negative.   Past Medical History:  Diagnosis Date  . CHF (congestive heart failure) (HCC)   . COPD (chronic obstructive pulmonary disease) (HCC)   . Coronary artery disease   . Diabetes mellitus without complication (HCC)   . Heart disease   . Hypertension   . Renal disorder   . Stroke [facility](HCC)     Past Surgical History:  Procedure Laterality Date  . ABDOMINAL HYSTERECTOMY    . CHOLECYSTECTOMY    . TONSILLECTOMY       reports that she has quit smoking. She has never used smokeless tobacco. She reports that she does not drink alcohol or use drugs.  No Known Allergies  Family History  Problem Relation Age of Onset  . Heart disease Mother   . Hypertension Mother   . Heart disease Father   . Hypertension Father   . Heart disease Sister   . Diabetes Sister   . Stroke Sister   . Kidney disease Sister   . Hypertension Sister   . Heart disease Sister   . Kidney disease Sister   . Hypertension Sister   . Kidney disease Daughter   . Hypertension Daughter     Prior to Admission medications   Medication Sig Start Date End Date Taking? Authorizing Provider  acetaminophen (TYLENOL) 500 MG tablet Take 500 mg by mouth every 6 (six) hours as needed for pain.    Historical Provider, MD  albuterol (PROVENTIL HFA;VENTOLIN HFA) 108 (90 Base) MCG/ACT inhaler Inhale 2 puffs into the lungs every 6 (six) hours as needed for wheezing or shortness of breath. 08/17/15  Maryann Mikhail, DO  calcium acetate (PHOSLO) 667 MG capsule Take 1,334 mg by mouth daily.    Historical Provider, MD  cloNIDine (CATAPRES) 0.2 MG tablet Take 0.2 mg by mouth 2 (two) times daily.    Historical Provider, MD  diclofenac sodium (VOLTAREN) 1 % GEL Apply 2 g topically 4 (four) times daily. 08/16/12   Rhetta Mura, MD  gabapentin (NEURONTIN) 100 MG capsule Take 1 capsule (100 mg total) by mouth at  bedtime. 08/16/12   Rhetta Mura, MD  insulin aspart (NOVOLOG) 100 UNIT/ML injection Inject 6 Units into the skin 3 (three) times daily with meals.    Historical Provider, MD  insulin glargine (LANTUS) 100 UNIT/ML injection Inject 16 Units into the skin at bedtime.    Historical Provider, MD  Nutritional Supplements (FEEDING SUPPLEMENT, NEPRO CARB STEADY,) LIQD Take 237 mLs by mouth 3 (three) times daily as needed (Supplement). 08/17/15   Maryann Mikhail, DO  oxyCODONE-acetaminophen (PERCOCET/ROXICET) 5-325 MG per tablet Take 1-2 tablets by mouth every 6 (six) hours as needed. 08/16/12   Rhetta Mura, MD  pravastatin (PRAVACHOL) 40 MG tablet Take 40 mg by mouth daily.    Historical Provider, MD  PRESCRIPTION MEDICATION Inhale 2 puffs into the lungs daily as needed (inhailer).    Historical Provider, MD  traMADol (ULTRAM) 50 MG tablet Take 50 mg by mouth every 8 (eight) hours as needed for pain.     Historical Provider, MD  Vitamin D, Ergocalciferol, (DRISDOL) 50000 UNITS CAPS Take 50,000 Units by mouth daily.    Historical Provider, MD    Physical Exam:  Constitutional:Elderly female in moderate respiratory distress lethargic, but arousable to verbal command  Vitals:   12/12/15 0315 12/12/15 0337 12/12/15 0434 12/12/15 0439  BP: 177/64  173/68 173/68  Pulse: 99  100 97  Resp: 23  25 22   Temp:      TempSrc:      SpO2: 90% (!) 89% 97%   Weight:      Height:       Eyes: PERRL, lids and conjunctivae normal ENMT: Mucous membranes are moist. Posterior pharynx clear of any exudate or lesions.Normal dentition.  Neck: normal, supple, no masses, no thyromegaly Respiratory: Tachypneic with bilateral wheezes and rales. Patient on BiPAP mask at this time. Cardiovascular: Regular rate and rhythm, no murmurs / rubs / gallops. No extremity edema. 2+ pedal pulses. No carotid bruits.  Abdomen: no tenderness, no masses palpated. No hepatosplenomegaly. Bowel sounds positive.  Musculoskeletal:  No  joint deformity upper and lower extremities. Good ROM, no contractures. Normal muscle tone.  Skin: no rashes, lesions, ulcers. No induration Neurologic: CN 2-12 grossly intact. Strength 5/5 in all 4.  Psychiatric: Normal judgment and insight.  Lethargic, but will arouse and follow up with . Normal mood.     Labs on Admission: I have personally reviewed following labs and imaging studies  CBC:  Recent Labs Lab 12/12/15 0310  WBC 8.5  HGB 9.9*  HCT 32.9*  MCV 98.5  PLT 232   Basic Metabolic Panel:  Recent Labs Lab 12/12/15 0310  NA 141  K 4.6  CL 102  CO2 27  GLUCOSE 86  BUN 29*  CREATININE 7.96*  CALCIUM 9.1   GFR: Estimated Creatinine Clearance: 5 mL/min (by C-G formula based on SCr of 7.96 mg/dL (H)). Liver Function Tests: No results for input(s): AST, ALT, ALKPHOS, BILITOT, PROT, ALBUMIN in the last 168 hours. No results for input(s): LIPASE, AMYLASE in the last 168 hours. No results for  input(s): AMMONIA in the last 168 hours. Coagulation Profile: No results for input(s): INR, PROTIME in the last 168 hours. Cardiac Enzymes: No results for input(s): CKTOTAL, CKMB, CKMBINDEX, TROPONINI in the last 168 hours. BNP (last 3 results) No results for input(s): PROBNP in the last 8760 hours. HbA1C: No results for input(s): HGBA1C in the last 72 hours. CBG: No results for input(s): GLUCAP in the last 168 hours. Lipid Profile: No results for input(s): CHOL, HDL, LDLCALC, TRIG, CHOLHDL, LDLDIRECT in the last 72 hours. Thyroid Function Tests: No results for input(s): TSH, T4TOTAL, FREET4, T3FREE, THYROIDAB in the last 72 hours. Anemia Panel: No results for input(s): VITAMINB12, FOLATE, FERRITIN, TIBC, IRON, RETICCTPCT in the last 72 hours. Urine analysis:    Component Value Date/Time   COLORURINE YELLOW 08/11/2012 1917   APPEARANCEUR CLOUDY (A) 08/11/2012 1917   LABSPEC 1.013 08/11/2012 1917   PHURINE 7.5 08/11/2012 1917   GLUCOSEU 100 (A) 08/11/2012 1917   HGBUR  TRACE (A) 08/11/2012 1917   BILIRUBINUR NEGATIVE 08/11/2012 1917   KETONESUR NEGATIVE 08/11/2012 1917   PROTEINUR >300 (A) 08/11/2012 1917   UROBILINOGEN 0.2 08/11/2012 1917   NITRITE NEGATIVE 08/11/2012 1917   LEUKOCYTESUR NEGATIVE 08/11/2012 1917   Sepsis Labs: No results found for this or any previous visit (from the past 240 hour(s)).   Radiological Exams on Admission: Dg Chest Portable 1 View  Result Date: 12/12/2015 CLINICAL DATA:  82 year old female with shortness of breath EXAM: PORTABLE CHEST 1 VIEW COMPARISON:  Chest radiograph dated 08/14/2015 FINDINGS: Single portable view of the chest demonstrate stable cardiomegaly with congestive changes, progressed from prior study. Chronic right lung base densities noted, however there is overall increased airspace density involving the mid to lower lung fields concerning for superimposed acute infection. Clinical correlation is recommended. Small bilateral pleural effusions. No pneumothorax. There is degenerative changes of the spine and shoulders. No acute fracture. IMPRESSION: Cardiomegaly with worsening of the congestive changes and interval development of small bilateral pleural effusions. Progression of mid to lower lung field airspace densities concerning for superimposed pneumonia. Clinical correlation is recommended. Electronically Signed   By: Elgie Collard M.D.   On: 12/12/2015 03:48    EKG: Independently reviewed. Sinus tachycardia 100 bpm  Assessment/Plan Respiratory failure with hypoxia and fluid overload:  Acute on chronic. Patient reporting dry cough and shortness of breath. Chest x-ray showing pulmonary edema with possible superimposed pneumonia patient with no other infective symptoms.Bnp elevated at 569.8. ABG with pH of 7.48, PCO2 38.2, PO2 40. Patient placed on BiPAP with decreased respiratory distress. - Admit to stepdown bed - Continue BiPAP and wean down as tolerated to home 2L [place]  - Nothing by mouth for now.   Restart diet once off bipap  COPD exacerbation: Acute on chronic. Patient reports nonproductive cough. Chest x-rays appears similar to previous admissions were no infectious cause found. - Would possibly repeat chest x-rays following HD  - Solu-Medrol 125mg  IV x 1 dose now - DuoNeb prn SOB/Wheezing  Elevated troponin: Acute. Troponin 0.21 - Trend troponins - may warrant further investigation  ESRD on HD(T/TH/Sat) - Nephrology consulted to dialyze patient this a.m. - Continue PhosLo  Diabetes mellitus type 2 - Hypoglycemic protocol - CBGs every before meals - Continue home Lantus 16 units daily at bedtime - Continue gabapentin  Essential hypertension - Continue clonidine  Anemia of chronic disease: Stable. - Follow CBC  DVT prophylaxis: Heparin Code Status: Full Family Communication: No family at bedside Disposition Plan:  possible discharge home in  2-3 days Consults called:  nephrology Admission status:  Stepdown Inpatient  Clydie Braun MD [facility] Pager [phone]  If 7PM-7AM, please contact night-coverage [email or web address] Password [facility]  12/12/2015, 4:46 AM

## 2015-12-12 NOTE — ED Notes (Signed)
Attempted to call report, RN will return call 

## 2015-12-12 NOTE — Progress Notes (Signed)
RT Note: Pt taken off Bipap at this time, placed on 3L [place], MD at bedside, pt in no distress at this time, awake & alert, RT will monitor.

## 2015-12-12 NOTE — ED Notes (Signed)
Report given to [place]arl[place], RN on 3S

## 2015-12-12 NOTE — ED Provider Notes (Signed)
[facility]-EMERGENCY DEPT Provider Note   CSN: [id number] Arrival date & time: 12/12/15  4098  By signing my name below, I, Sandrea Hammond, attest that this documentation has been prepared under the direction and in the presence of Gilda Crease, MD. Electronically Signed: Sandrea Hammond, ED Scribe. 12/12/15. 3:30 AM.   History   Chief Complaint Chief Complaint  Patient presents with  . Shortness of Breath    HPI Comments: Nicole Cox is a 82 y.o. female who presents to the Emergency Department complaining of shortness of breath for the last 3 days with nonproductive cough. Pt uses 2L of supplemental oxygen at home. She says she has been using her prescribed home nebulizer treatments with no resolution of her symptoms. Pt says she received an influenza vaccine 3 days ago. Per chart review, pt has had 2 hospital admissions this year for shortness of breath (07/2015, 10/2015). Per chart, pt has PMHx of CHF, COPD, CAD, CVA, HTN, and DM.   The history is provided by the patient. No language interpreter was used.    Past Medical History:  Diagnosis Date  . CHF (congestive heart failure) (HCC)   . COPD (chronic obstructive pulmonary disease) (HCC)   . Coronary artery disease   . Diabetes mellitus without complication (HCC)   . Heart disease   . Hypertension   . Renal disorder   . Stroke [facility])     Patient Active Problem List   Diagnosis Date Noted  . Respiratory failure with hypoxia (HCC) 12/12/2015  . Acute on chronic respiratory failure with hypoxia (HCC) 12/12/2015  . Fluid overload 12/12/2015  . Palliative care encounter   . Acute respiratory failure with hypoxia (HCC) 08/13/2015  . ESRD on hemodialysis (HCC) 08/13/2015  . Hypertensive urgency 08/13/2015  . Chronic anemia 08/13/2015  . Acute respiratory failure (HCC) 08/13/2015  . HCAP (healthcare-associated pneumonia) 08/11/2012  . Hypoxia 08/11/2012  . ESRD (end stage renal disease) (HCC) 08/11/2012  . Hypertension  08/11/2012  . Diabetes mellitus (HCC) 08/11/2012  . C1 cervical fracture (HCC) 08/11/2012  . Abnormal MRI, thoracic spine 08/11/2012  . COPD (chronic obstructive pulmonary disease) (HCC) 08/11/2012  . CAD (coronary artery disease) 08/11/2012  . Fall 08/11/2012    Past Surgical History:  Procedure Laterality Date  . ABDOMINAL HYSTERECTOMY    . CHOLECYSTECTOMY    . TONSILLECTOMY      OB History    No data available       Home Medications    Prior to Admission medications   Medication Sig Start Date End Date Taking? Authorizing Provider  acetaminophen (TYLENOL) 500 MG tablet Take 500 mg by mouth every 6 (six) hours as needed for pain.    Historical Provider, MD  albuterol (PROVENTIL HFA;VENTOLIN HFA) 108 (90 Base) MCG/ACT inhaler Inhale 2 puffs into the lungs every 6 (six) hours as needed for wheezing or shortness of breath. 08/17/15   Maryann Mikhail, DO  calcium acetate (PHOSLO) 667 MG capsule Take 1,334 mg by mouth daily.    Historical Provider, MD  cloNIDine (CATAPRES) 0.2 MG tablet Take 0.2 mg by mouth 2 (two) times daily.    Historical Provider, MD  diclofenac sodium (VOLTAREN) 1 % GEL Apply 2 g topically 4 (four) times daily. 08/16/12   Rhetta Mura, MD  gabapentin (NEURONTIN) 100 MG capsule Take 1 capsule (100 mg total) by mouth at bedtime. 08/16/12   Rhetta Mura, MD  insulin aspart (NOVOLOG) 100 UNIT/ML injection Inject 6 Units into the skin 3 (three) times daily  with meals.    Historical Provider, MD  insulin glargine (LANTUS) 100 UNIT/ML injection Inject 16 Units into the skin at bedtime.    Historical Provider, MD  Nutritional Supplements (FEEDING SUPPLEMENT, NEPRO CARB STEADY,) LIQD Take 237 mLs by mouth 3 (three) times daily as needed (Supplement). 08/17/15   Maryann Mikhail, DO  oxyCODONE-acetaminophen (PERCOCET/ROXICET) 5-325 MG per tablet Take 1-2 tablets by mouth every 6 (six) hours as needed. 08/16/12   Rhetta Mura, MD  pravastatin (PRAVACHOL) 40 MG  tablet Take 40 mg by mouth daily.    Historical Provider, MD  PRESCRIPTION MEDICATION Inhale 2 puffs into the lungs daily as needed (inhailer).    Historical Provider, MD  traMADol (ULTRAM) 50 MG tablet Take 50 mg by mouth every 8 (eight) hours as needed for pain.     Historical Provider, MD  Vitamin D, Ergocalciferol, (DRISDOL) 50000 UNITS CAPS Take 50,000 Units by mouth daily.    Historical Provider, MD    Family History Family History  Problem Relation Age of Onset  . Heart disease Mother   . Hypertension Mother   . Heart disease Father   . Hypertension Father   . Heart disease Sister   . Diabetes Sister   . Stroke Sister   . Kidney disease Sister   . Hypertension Sister   . Heart disease Sister   . Kidney disease Sister   . Hypertension Sister   . Kidney disease Daughter   . Hypertension Daughter     Social History Social History  Substance Use Topics  . Smoking status: Former Games developer  . Smokeless tobacco: Never Used  . Alcohol use No     Allergies   Review of patient's allergies indicates no known allergies.   Review of Systems Review of Systems  Respiratory: Positive for shortness of breath.   All other systems reviewed and are negative.    Physical Exam Updated Vital Signs BP 179/69   Pulse 95   Temp 97.8 F (36.6 C) (Axillary)   Resp 19   Ht 5\' 2"  (1.575 m)   Wt 157 lb 10.1 oz (71.5 kg)   SpO2 97%   BMI 28.83 kg/m   Physical Exam  Constitutional: She is oriented to person, place, and time. She appears well-developed and well-nourished. No distress.  HENT:  Head: Normocephalic and atraumatic.  Right Ear: Hearing normal.  Left Ear: Hearing normal.  Nose: Nose normal.  Mouth/Throat: Oropharynx is clear and moist and mucous membranes are normal.  Eyes: Conjunctivae and EOM are normal. Pupils are equal, round, and reactive to light.  Neck: Normal range of motion. Neck supple.  Cardiovascular: Regular rhythm, S1 normal and S2 normal.  Exam reveals  no gallop and no friction rub.   No murmur heard. Pulmonary/Chest: She has wheezes. She has rales. She exhibits no tenderness.  Mild tachypnea Wheezing, rales, and rhonchi  Abdominal: Soft. Normal appearance and bowel sounds are normal. There is no hepatosplenomegaly. There is no tenderness. There is no rebound, no guarding, no tenderness at McBurney's point and negative Murphy's sign. No hernia.  Musculoskeletal: Normal range of motion.  Neurological: She is alert and oriented to person, place, and time. She has normal strength. No cranial nerve deficit or sensory deficit. Coordination normal. GCS eye subscore is 4. GCS verbal subscore is 5. GCS motor subscore is 6.  Skin: Skin is warm, dry and intact. No rash noted. No cyanosis.  Psychiatric: She has a normal mood and affect. Her speech is normal and behavior  is normal. Thought content normal.  Nursing note and vitals reviewed.    ED Treatments / Results   DIAGNOSTIC STUDIES: Oxygen Saturation is 90% on 2L supplemental oxygen via nasal canula, low by my interpretation.    COORDINATION OF CARE: 3:13 AM Discussed treatment plan with pt at bedside and pt agreed to plan.   Labs (all labs ordered are listed, but only abnormal results are displayed) Labs Reviewed  BASIC METABOLIC PANEL - Abnormal; Notable for the following:       Result Value   BUN 29 (*)    Creatinine, Ser 7.96 (*)    GFR calc non Af Amer 4 (*)    GFR calc Af Amer 5 (*)    All other components within normal limits  CBC - Abnormal; Notable for the following:    RBC 3.34 (*)    Hemoglobin 9.9 (*)    HCT 32.9 (*)    RDW 17.4 (*)    All other components within normal limits  BRAIN NATRIURETIC PEPTIDE - Abnormal; Notable for the following:    B Natriuretic Peptide 569.8 (*)    All other components within normal limits  I-STAT TROPOININ, ED - Abnormal; Notable for the following:    Troponin i, poc 0.21 (*)    All other components within normal limits  I-STAT  ARTERIAL BLOOD GAS, ED - Abnormal; Notable for the following:    pH, Arterial 7.485 (*)    pO2, Arterial 40.0 (*)    Bicarbonate 29.2 (*)    Acid-Base Excess 5.0 (*)    All other components within normal limits  TROPONIN I  TROPONIN I  TROPONIN I    EKG  EKG Interpretation  Date/Time:  Thursday December 12 2015 03:12:08 EDT Ventricular Rate:  100 PR Interval:    QRS Duration: 72 QT Interval:  368 QTC Calculation: 475 R Axis:   14 Text Interpretation:  Sinus tachycardia LVH with secondary repolarization abnormality No significant change since last tracing Confirmed by Noam Karaffa  MD, Cristal Deer ([id number]) on 12/12/2015 4:07:52 AM Also confirmed by Blinda Leatherwood  MD, Finnick Orosz [phone]), editor [place]  CCT, BEVERLY ([id number])  on 12/12/2015 6:58:43 AM       Radiology Dg Chest Portable 1 View  Result Date: 12/12/2015 CLINICAL DATA:  82 year old female with shortness of breath EXAM: PORTABLE CHEST 1 VIEW COMPARISON:  Chest radiograph dated 08/14/2015 FINDINGS: Single portable view of the chest demonstrate stable cardiomegaly with congestive changes, progressed from prior study. Chronic right lung base densities noted, however there is overall increased airspace density involving the mid to lower lung fields concerning for superimposed acute infection. Clinical correlation is recommended. Small bilateral pleural effusions. No pneumothorax. There is degenerative changes of the spine and shoulders. No acute fracture. IMPRESSION: Cardiomegaly with worsening of the congestive changes and interval development of small bilateral pleural effusions. Progression of mid to lower lung field airspace densities concerning for superimposed pneumonia. Clinical correlation is recommended. Electronically Signed   By: Elgie Collard M.D.   On: 12/12/2015 03:48    Procedures Procedures (including critical care time)  Medications Ordered in ED Medications  insulin glargine (LANTUS) injection 16 Units (not  administered)  acetaminophen (TYLENOL) tablet 500 mg (not administered)  calcium acetate (PHOSLO) capsule 1,334 mg (not administered)  diclofenac sodium (VOLTAREN) 1 % transdermal gel 2 g (not administered)  pravastatin (PRAVACHOL) tablet 40 mg (not administered)  gabapentin (NEURONTIN) capsule 100 mg (not administered)  oxyCODONE-acetaminophen (PERCOCET/ROXICET) 5-325 MG per tablet 1-2 tablet (not administered)  Vitamin  D (Ergocalciferol) (DRISDOL) capsule 50,000 Units (not administered)  heparin injection 5,000 Units (not administered)  ondansetron (ZOFRAN) tablet 4 mg (not administered)    Or  ondansetron (ZOFRAN) injection 4 mg (not administered)  albuterol (PROVENTIL) (2.5 MG/3ML) 0.083% nebulizer solution 2.5 mg (not administered)  ipratropium (ATROVENT) nebulizer solution 0.5 mg (not administered)  guaiFENesin (MUCINEX) 12 hr tablet 600 mg (not administered)  insulin aspart (novoLOG) injection 0-9 Units (not administered)  methylPREDNISolone sodium succinate (SOLU-MEDROL) 125 mg/2 mL injection 125 mg (not administered)  albuterol (PROVENTIL) (2.5 MG/3ML) 0.083% nebulizer solution 5 mg (2.5 mg Nebulization Given 12/12/15 0335)     Initial Impression / Assessment and Plan / ED Course  I have reviewed the triage vital signs and the nursing notes.  Pertinent labs & imaging results that were available during my care of the patient were reviewed by me and considered in my medical decision making (see chart for details).  Clinical Course   Patient with history of COPD and CHF, end-stage renal disease on dialysis presents to the emergency department with 2-3 day history of progressively worsening shortness of breath associated with nonproductive cough. She has not had any fever. Patient is on oxygen, 2 L by nasal cannula continuously at home. She also uses nebulizer treatments. Patient used 3 nebulizers in a row tonight without improvement. EMS was called to house twice tonight, the second  time transported her to the ER. She was hypoxic and was given additional nebulizer treatments with only minimal improvement.  Upon initial evaluation, patient was mildly tachypneic. Lung examination revealed mild wheezing with diffuse rales and rhonchi. Chest x-ray shows cardiomegaly with diffuse congestion consistent with volume overload. There is, however, increased density at the right base that is concerning for pneumonia.  Reviewing patient's records, however, reveals [facility] followed by repeat hospitalization here in June of this year. With each of those previous hospitalizations patient had density at the right base that was initially thought to be pneumonia, but ultimately felt to be more consistent with volume. Antibiotics will therefore be withheld. This was discussed with Dr. Katrinka BlazingSmith, on call for the hospitalist and he agreed to hold off on antibiotics at this time.  EKG does not show ischemic changes. It is unchanged from previous. Troponin is elevated at 0.21. This is likely secondary to volume overload and chronic renal failure. She is not actively having any chest pain. Will need to monitor.  Blood gas shows hypoxic respiratory failure. Patient initiated on BiPAP. She will require hospitalization for further management.  CRITICAL CARE Performed by: Gilda CreasePOLLINA, Ysabel Stankovich J.   Total critical care time: 30 minutes  Critical care time was exclusive of separately billable procedures and treating other patients.  Critical care was necessary to treat or prevent imminent or life-threatening deterioration.  Critical care was time spent personally by me on the following activities: development of treatment plan with patient and/or surrogate as well as nursing, discussions with consultants, evaluation of patient's response to treatment, examination of patient, obtaining history from patient or surrogate, ordering and performing treatments and interventions,  ordering and review of laboratory studies, ordering and review of radiographic studies, pulse oximetry and re-evaluation of patient's condition.   Final Clinical Impressions(s) / ED Diagnoses   Final diagnoses:  Acute respiratory failure with hypoxia [facility](HCC)    New Prescriptions Current Discharge Medication List     I personally performed the services described in this documentation, which was scribed in my presence. The recorded information has been  reviewed and is accurate.     Gilda Crease, MD 12/12/15 [phone]

## 2015-12-12 NOTE — Progress Notes (Signed)
PROGRESS NOTE    Nicole Cox  ZOX:[id number] DOB: 1931-03-18 DOA: 12/12/2015 PCP: [name],J STEWART, MD   Brief Narrative:  Nicole Cox is a pleasant 82 year old female with a history of end-stage renal disease on hemodialysis Tuesdays Thursdays and Saturdays, admitted to medicine service on 12/04/2015 when she presented with complaints of shortness of breath. Initial workup included a chest x-ray that showed worsening congestive changes and interval development of small bilateral pleural effusions. It was suspected that volume overload, COPD exacerbation, precipitated acute on chronic respiratory failure. Nephrology was consulted as she was taken to hemodialysis. She was also given steroids and nebulizer treatments.  Assessment & Plan:   Principal Problem:   Acute on chronic respiratory failure with hypoxia (HCC) Active Problems:   Hypertension   Diabetes mellitus (HCC)   COPD (chronic obstructive pulmonary disease) (HCC)   ESRD on hemodialysis (HCC)   Fluid overload  1.  Acute on chronic respiratory failure -Patient presenting in respiratory distress, having increased oxygen requirement, placed on NPPV overnight, suspect volume overload state along with possibly a superimposed COPD exacerbation precipitating symptoms -She underwent hemodialysis this morning with subsequent clinical improvement -Continue close monitoring  2.  Acute on chronic diastolic heart failure -Presented with acute on chronic respiratory failure with chest x-ray showing pulmonary vascular congestion -Labs showed elevated BNP of 569.8 -End-stage renal disease patient, taken to hemodialysis  3.  Possible superimposed COPD exacerbation -I suspect pulmonary edema precipitating COPD exacerbation -She was given dose of IV steroids overnight, continue nebulizer treatments, supplemental oxygen  4.  History of end-stage renal disease -She is a dialysis patient, Tuesday Thursday Saturday schedule -Nephrology  consulted for dialysis given volume overload state  5.  Elevated troponin. -Lab showing stable troponin of 0.21 and 0.24. She denies chest pain, EKG did not reveal acute ischemic changes, as I suspect mild troponin elevation related to demand ischemia/CHF  -Continuous cardiac monitoring in place  6.  Insulin dependent diabetes mellitus -Continue Lantus 16 units subcutaneous at bedtime along with sliding scale coverage   DVT prophylaxis: subcutaneous heparin Code Status: full code Family Communication:  Disposition Plan: Undergoing hemodialysis today  Consultants:   Nephrology  Procedures:   HD  Antimicrobials:      Subjective: States feeling little better this morning, denies chest pain  Objective: Vitals:   12/12/15 1130 12/12/15 1141 12/12/15 1255 12/12/15 1319  BP: (!) 166/65 (!) 158/69 (!) 172/63 (!) 178/70  Pulse: 99 97  97  Resp: (!) 22 20  18   Temp:  98.6 F (37 C)  98.5 F (36.9 C)  TempSrc:  Oral Oral Oral  SpO2: 98% 99% 92% 91%  Weight:  64.4 kg (141 lb 15.6 oz)    Height:        Intake/Output Summary (Last 24 hours) at 12/12/15 1425 Last data filed at 12/12/15 1141  Gross per 24 hour  Intake                0 ml  Output             1819 ml  Net            -1819 ml   Filed Weights   12/12/15 0615 12/12/15 0800 12/12/15 1141  Weight: 71.5 kg (157 lb 10.1 oz) 69.9 kg (154 lb 1.6 oz) 64.4 kg (141 lb 15.6 oz)    Examination:  General exam: Chronically ill-appearing, mild distress Respiratory system: coarse respiratory sounds, having by basilar crackles, rales, expiratory wheezes Cardiovascular system: S1 &  S2 heard, RRR. No JVD, murmurs, rubs, gallops or clicks, 1+ lower extremity edema Gastrointestinal system: Abdomen is nondistended, soft and nontender. No organomegaly or masses felt. Normal bowel sounds heard. Central nervous system: Alert and oriented. No focal neurological deficits. Extremities: Symmetric 5 x 5 power. Skin: No rashes,  lesions or ulcers Psychiatry: Judgement and insight appear normal. Mood & affect appropriate.     Data Reviewed: I have personally reviewed following labs and imaging studies  CBC:  Recent Labs Lab 12/12/15 0310  WBC 8.5  HGB 9.9*  HCT 32.9*  MCV 98.5  PLT 232   Basic Metabolic Panel:  Recent Labs Lab 12/12/15 0310  NA 141  K 4.6  CL 102  CO2 27  GLUCOSE 86  BUN 29*  CREATININE 7.96*  CALCIUM 9.1   GFR: Estimated Creatinine Clearance: 4.9 mL/min (by C-G formula based on SCr of 7.96 mg/dL (H)). Liver Function Tests: No results for input(s): AST, ALT, ALKPHOS, BILITOT, PROT, ALBUMIN in the last 168 hours. No results for input(s): LIPASE, AMYLASE in the last 168 hours. No results for input(s): AMMONIA in the last 168 hours. Coagulation Profile: No results for input(s): INR, PROTIME in the last 168 hours. Cardiac Enzymes:  Recent Labs Lab 12/12/15 0652  TROPONINI 0.24*   BNP (last 3 results) No results for input(s): PROBNP in the last 8760 hours. HbA1C: No results for input(s): HGBA1C in the last 72 hours. CBG: No results for input(s): GLUCAP in the last 168 hours. Lipid Profile: No results for input(s): CHOL, HDL, LDLCALC, TRIG, CHOLHDL, LDLDIRECT in the last 72 hours. Thyroid Function Tests: No results for input(s): TSH, T4TOTAL, FREET4, T3FREE, THYROIDAB in the last 72 hours. Anemia Panel: No results for input(s): VITAMINB12, FOLATE, FERRITIN, TIBC, IRON, RETICCTPCT in the last 72 hours. Sepsis Labs: No results for input(s): PROCALCITON, LATICACIDVEN in the last 168 hours.  Recent Results (from the past 240 hour(s))  MRSA PCR Screening     Status: None   Collection Time: 12/12/15  7:25 AM  Result Value Ref Range Status   MRSA by PCR NEGATIVE NEGATIVE Final    Comment:        The GeneXpert MRSA Assay (FDA approved for NASAL specimens only), is one component of a comprehensive MRSA colonization surveillance program. It is not intended to diagnose  MRSA infection nor to guide or monitor treatment for MRSA infections.          Radiology Studies: Dg Chest Portable 1 View  Result Date: 12/12/2015 CLINICAL DATA:  82 year old female with shortness of breath EXAM: PORTABLE CHEST 1 VIEW COMPARISON:  Chest radiograph dated 08/14/2015 FINDINGS: Single portable view of the chest demonstrate stable cardiomegaly with congestive changes, progressed from prior study. Chronic right lung base densities noted, however there is overall increased airspace density involving the mid to lower lung fields concerning for superimposed acute infection. Clinical correlation is recommended. Small bilateral pleural effusions. No pneumothorax. There is degenerative changes of the spine and shoulders. No acute fracture. IMPRESSION: Cardiomegaly with worsening of the congestive changes and interval development of small bilateral pleural effusions. Progression of mid to lower lung field airspace densities concerning for superimposed pneumonia. Clinical correlation is recommended. Electronically Signed   By: Elgie Collard M.D.   On: 12/12/2015 03:48        Scheduled Meds: . calcium acetate  1,334 mg Oral Daily  . diclofenac sodium  2 g Topical QID  . gabapentin  100 mg Oral QHS  . guaiFENesin  600 mg  Oral BID  . heparin  5,000 Units Subcutaneous Q8H  . insulin aspart  0-9 Units Subcutaneous TID WC  . insulin glargine  16 Units Subcutaneous QHS  . methylPREDNISolone (SOLU-MEDROL) injection  125 mg Intravenous STAT  . pravastatin  40 mg Oral Daily  . Vitamin D (Ergocalciferol)  50,000 Units Oral Daily   Continuous Infusions:    LOS: 0 days    Time spent: 35 min    Jeralyn Bennett, MD [facility] Pager [phone]  If 7PM-7AM, please contact night-coverage [email or web address] Password [email or web address] 12/12/2015, 2:25 PM

## 2015-12-13 DIAGNOSIS — I5031 Acute diastolic (congestive) heart failure: Secondary | ICD-10-CM

## 2015-12-13 LAB — CBC
HEMATOCRIT: 33.3 % — AB (ref 36.0–46.0)
HEMOGLOBIN: 9.8 g/dL — AB (ref 12.0–15.0)
MCH: 29.1 pg (ref 26.0–34.0)
MCHC: 29.4 g/dL — ABNORMAL LOW (ref 30.0–36.0)
MCV: 98.8 fL (ref 78.0–100.0)
Platelets: 200 10*3/uL (ref 150–400)
RBC: 3.37 MIL/uL — ABNORMAL LOW (ref 3.87–5.11)
RDW: 17.1 % — AB (ref 11.5–15.5)
WBC: 6.6 10*3/uL (ref 4.0–10.5)

## 2015-12-13 LAB — BASIC METABOLIC PANEL
ANION GAP: 8 (ref 5–15)
BUN: 21 mg/dL — ABNORMAL HIGH (ref 6–20)
CALCIUM: 8.5 mg/dL — AB (ref 8.9–10.3)
CHLORIDE: 101 mmol/L (ref 101–111)
CO2: 28 mmol/L (ref 22–32)
Creatinine, Ser: 6.27 mg/dL — ABNORMAL HIGH (ref 0.44–1.00)
GFR calc non Af Amer: 6 mL/min — ABNORMAL LOW (ref >60)
GFR, EST AFRICAN AMERICAN: 6 mL/min — AB (ref >60)
Glucose, Bld: 96 mg/dL (ref 65–99)
Potassium: 4.8 mmol/L (ref 3.5–5.1)
SODIUM: 137 mmol/L (ref 135–145)

## 2015-12-13 LAB — GLUCOSE, CAPILLARY
GLUCOSE-CAPILLARY: 47 mg/dL — AB (ref 65–99)
GLUCOSE-CAPILLARY: 62 mg/dL — AB (ref 65–99)
Glucose-Capillary: 125 mg/dL — ABNORMAL HIGH (ref 65–99)
Glucose-Capillary: 122 mg/dL — ABNORMAL HIGH (ref 65–99)
Glucose-Capillary: 162 mg/dL — ABNORMAL HIGH (ref 65–99)

## 2015-12-13 MED ORDER — DARBEPOETIN ALFA 100 MCG/0.5ML IJ SOSY
100.0000 ug | PREFILLED_SYRINGE | INTRAMUSCULAR | Status: DC
  Administered 2015-12-14: 100 ug via INTRAVENOUS
  Filled 2015-12-13: qty 0.5

## 2015-12-13 MED ORDER — CLONIDINE HCL 0.2 MG PO TABS
0.2000 mg | ORAL_TABLET | Freq: Two times a day (BID) | ORAL | Status: DC
  Administered 2015-12-13 – 2015-12-16 (×7): 0.2 mg via ORAL
  Filled 2015-12-13 (×7): qty 1

## 2015-12-13 MED ORDER — DEXTROSE 50 % IV SOLN
INTRAVENOUS | Status: AC
  Filled 2015-12-13: qty 50

## 2015-12-13 MED ORDER — RENA-VITE PO TABS
1.0000 | ORAL_TABLET | Freq: Every day | ORAL | Status: DC
  Administered 2015-12-13 – 2015-12-15 (×3): 1 via ORAL
  Filled 2015-12-13 (×3): qty 1

## 2015-12-13 MED ORDER — GLUCOSE 40 % PO GEL
ORAL | Status: AC
  Administered 2015-12-13: 37.5 g
  Filled 2015-12-13: qty 1

## 2015-12-13 NOTE — Progress Notes (Addendum)
Hypoglycemic Event  CBG: 47  Treatment: 15 GM carbohydrate snack  Symptoms: Sweaty, shakey  Follow-up CBG: Time:0830 CBG Result:62- given glucose oral gel.   Possible Reasons for Event: Unknown  Comments: Pt alert, oriented. MD assessed before CBG retrieved. Pt up to chair with RN and MD at bedside. Will continue to monitor.     Ozella RocksSchmitz, Aldridge Krzyzanowski P

## 2015-12-13 NOTE — Consult Note (Signed)
Reason for Consult:Vol overload, ESRD Referring Physician: Dr. Charmaine Cox is an 82 y.o. female.  HPI: 82 yr female with ESRD,presumably DM, on HD x 4 yr, dialyzes at [facility], does not know Nephrologist, does not Dialyze with [facility].  Brought in yest SOB for over 24 h.  Dialyzed by Dr. Bary Cox yest.  Not getting better.  Has severe O2 dependent COPD. Hosp at [place] 2 wk ago with similar.  Here 5/17 with fluid overload, COPD.  Has cough with no phlegm, no fevers, chill, N, V, no CP.  Cramped at end of HD yest and shortened her Tx on Tues at [facility].  Denies edema.  Sob with minimal exertion and lying flat.  Using nebs at home.  Bs high recently at home in 300s. Old hx HTn, CVA.  Constitutional: weak, sob Eyes: negative Ears, nose, mouth, throat, and face: negative Respiratory: as above, cough, wheezing Cardiovascular: negative Gastrointestinal: negative Genitourinary:negative Integument/breast: negative Hematologic/lymphatic: negative Musculoskeletal:negative Neurological: olc CVA Endocrine: DM Allergic/Immunologic: negative   Dialyzes at [facility] on TTS since only a month or so there, prior to that at [facility]. Primary Nephrologist unknown. EDW unkown. HD [place] unknown,  Access LUA AVF.  Past Medical History:  Diagnosis Date  . CHF (congestive heart failure) ([place])   . COPD (chronic obstructive pulmonary disease) ([place])   . Coronary artery disease   . Diabetes mellitus without complication ([place])   . Heart disease   . Hypertension   . Renal disorder   . Stroke [facility])     Past Surgical History:  Procedure Laterality Date  . ABDOMINAL HYSTERECTOMY    . CHOLECYSTECTOMY    . TONSILLECTOMY      Family History  Problem Relation Age of Onset  . Heart disease Mother   . Hypertension Mother   . Heart disease Father   . Hypertension Father   . Heart disease Sister   . Diabetes Sister   . Stroke Sister   . Kidney disease Sister   . Hypertension Sister   . Heart disease Sister    . Kidney disease Sister   . Hypertension Sister   . Kidney disease Daughter   . Hypertension Daughter     Social History:  reports that she has quit smoking. She has never used smokeless tobacco. She reports that she does not drink alcohol or use drugs.  Allergies: No Known Allergies  Medications:  I have reviewed the patient's current medications. Prior to Admission:  Prescriptions Prior to Admission  Medication Sig Dispense Refill Last Dose  . acetaminophen (TYLENOL) 500 MG tablet Take 500 mg by mouth every 6 (six) hours as needed for pain.   12/11/2015 at Unknown time  . albuterol (PROVENTIL HFA;VENTOLIN HFA) 108 (90 Base) MCG/ACT inhaler Inhale 2 puffs into the lungs every 6 (six) hours as needed for wheezing or shortness of breath. 1 Inhaler 2 12/11/2015 at Unknown time  . albuterol (PROVENTIL) (2.5 MG/3ML) 0.083% nebulizer solution USE 1 VIAL VIA NEBULIZER 3 TIMES A DAY AS NEEDED FOR WHEEZING OR SHORTNESS OF BREATH  5 12/12/2015 at Unknown time  . calcium acetate (PHOSLO) 667 MG capsule Take 2,001-2,668 mg by mouth See admin instructions. Take 2668 mg by mouth 3 times daily with meals and take 2001 mg by mouth with snacks   12/11/2015 at Unknown time  . Cholecalciferol (VITAMIN D PO) Take 1 capsule by mouth daily.   12/11/2015 at Unknown time  . cloNIDine (CATAPRES - DOSED IN MG/24 HR) 0.2 mg/24hr  patch Place 0.2 mg onto the skin every Thursday.   5 12/05/2015 at Unknown  . fluticasone furoate-vilanterol (BREO ELLIPTA) 100-25 MCG/INH AEPB Inhale 1 puff into the lungs daily.   12/11/2015 at Unknown time  . insulin glargine (LANTUS) 100 UNIT/ML injection Inject 16 Units into the skin at bedtime.   12/11/2015 at Unknown time  . lidocaine-prilocaine (EMLA) cream Apply 1 application topically every other day.   12/11/2015 at Unknown time  . lisinopril (PRINIVIL,ZESTRIL) 40 MG tablet Take 40 mg by mouth every evening.  5 12/11/2015 at Unknown time  . NIFEdipine (PROCARDIA XL/ADALAT-CC) 60 MG 24 hr  tablet Take 60 mg by mouth 2 (two) times daily.  5 12/11/2015 at Unknown time  . NOVOLOG MIX 70/30 FLEXPEN (70-30) 100 UNIT/ML FlexPen Inject 6 Units into the skin 3 (three) times daily with meals.  4 12/11/2015 at Unknown time  . pravastatin (PRAVACHOL) 40 MG tablet Take 40 mg by mouth daily.   12/11/2015 at Unknown time  . tiotropium (SPIRIVA HANDIHALER) 18 MCG inhalation capsule Place 1 capsule into inhaler and inhale daily.   12/11/2015 at Unknown time  . traMADol (ULTRAM) 50 MG tablet Take 50 mg by mouth every 8 (eight) hours as needed for pain.    Past Week at Unknown time     Results for orders placed or performed during the hospital encounter of 12/12/15 (from the past 48 hour(s))  Basic metabolic panel     Status: Abnormal   Collection Time: 12/12/15  3:10 AM  Result Value Ref Range   Sodium 141 135 - 145 mmol/L   Potassium 4.6 3.5 - 5.1 mmol/L   Chloride 102 101 - 111 mmol/L   CO2 27 22 - 32 mmol/L   Glucose, Bld 86 65 - 99 mg/dL   BUN 29 (H) 6 - 20 mg/dL   Creatinine, Ser 7.96 (H) 0.44 - 1.00 mg/dL   Calcium 9.1 8.9 - 10.3 mg/dL   GFR calc non Af Amer 4 (L) >60 mL/min   GFR calc Af Amer 5 (L) >60 mL/min    Comment: (NOTE) The eGFR has been calculated using the CKD EPI equation. This calculation has not been validated in all clinical situations. eGFR's persistently <60 mL/min signify possible Chronic Kidney Disease.    Anion gap 12 5 - 15  CBC     Status: Abnormal   Collection Time: 12/12/15  3:10 AM  Result Value Ref Range   WBC 8.5 4.0 - 10.5 K/uL   RBC 3.34 (L) 3.87 - 5.11 MIL/uL   Hemoglobin 9.9 (L) 12.0 - 15.0 g/dL   HCT 32.9 (L) 36.0 - 46.0 %   MCV 98.5 78.0 - 100.0 fL   MCH 29.6 26.0 - 34.0 pg   MCHC 30.1 30.0 - 36.0 g/dL   RDW 17.4 (H) 11.5 - 15.5 %   Platelets 232 150 - 400 K/uL  Brain natriuretic peptide     Status: Abnormal   Collection Time: 12/12/15  3:11 AM  Result Value Ref Range   B Natriuretic Peptide 569.8 (H) 0.0 - 100.0 pg/mL  I-Stat Troponin, ED  (not at [facility])     Status: Abnormal   Collection Time: 12/12/15  3:21 AM  Result Value Ref Range   Troponin i, poc 0.21 (HH) 0.00 - 0.08 ng/mL   Comment NOTIFIED PHYSICIAN    Comment 3            Comment: Due to the release kinetics of cTnI, a negative result within the  first hours of the onset of symptoms does not rule out myocardial infarction with certainty. If myocardial infarction is still suspected, repeat the test at appropriate intervals.   I-Stat Arterial Blood Gas, ED - (order at [facility] and [facility] only)     Status: Abnormal   Collection Time: 12/12/15  3:42 AM  Result Value Ref Range   pH, Arterial 7.485 (H) 7.350 - 7.450   pCO2 arterial 38.6 32.0 - 48.0 mmHg   pO2, Arterial 40.0 (LL) 83.0 - 108.0 mmHg   Bicarbonate 29.2 (H) 20.0 - 28.0 mmol/L   TCO2 30 0 - 100 mmol/L   O2 Saturation 80.0 %   Acid-Base Excess 5.0 (H) 0.0 - 2.0 mmol/L   Patient temperature 98.0 F    Collection site RADIAL, ALLEN'S TEST ACCEPTABLE    Drawn by RT    Sample type ARTERIAL   Troponin I (q 6hr x 3)     Status: Abnormal   Collection Time: 12/12/15  6:52 AM  Result Value Ref Range   Troponin I 0.24 (HH) <0.03 ng/mL    Comment: CRITICAL RESULT CALLED TO, READ BACK BY AND VERIFIED WITH: PENNINGTON,T RN @ 0809 12/12/15 LEONARD,A   MRSA PCR Screening     Status: None   Collection Time: 12/12/15  7:25 AM  Result Value Ref Range   MRSA by PCR NEGATIVE NEGATIVE    Comment:        The GeneXpert MRSA Assay (FDA approved for NASAL specimens only), is one component of a comprehensive MRSA colonization surveillance program. It is not intended to diagnose MRSA infection nor to guide or monitor treatment for MRSA infections.   Glucose, capillary     Status: None   Collection Time: 12/12/15  1:18 PM  Result Value Ref Range   Glucose-Capillary 80 65 - 99 mg/dL   Comment 1 Notify RN    Comment 2 Document in Chart   Troponin I (q 6hr x 3)     Status: Abnormal   Collection Time: 12/12/15  1:53 PM  Result  Value Ref Range   Troponin I 0.23 (HH) <0.03 ng/mL    Comment: CRITICAL VALUE NOTED.  VALUE IS CONSISTENT WITH PREVIOUSLY REPORTED AND CALLED VALUE.  Glucose, capillary     Status: Abnormal   Collection Time: 12/12/15  5:44 PM  Result Value Ref Range   Glucose-Capillary 59 (L) 65 - 99 mg/dL  Glucose, capillary     Status: None   Collection Time: 12/12/15  6:08 PM  Result Value Ref Range   Glucose-Capillary 87 65 - 99 mg/dL  Troponin I (q 6hr x 3)     Status: Abnormal   Collection Time: 12/12/15  7:04 PM  Result Value Ref Range   Troponin I 0.22 (HH) <0.03 ng/mL    Comment: CRITICAL VALUE NOTED.  VALUE IS CONSISTENT WITH PREVIOUSLY REPORTED AND CALLED VALUE.  Glucose, capillary     Status: Abnormal   Collection Time: 12/12/15  9:04 PM  Result Value Ref Range   Glucose-Capillary 106 (H) 65 - 99 mg/dL  CBC     Status: Abnormal   Collection Time: 12/13/15  2:42 AM  Result Value Ref Range   WBC 6.6 4.0 - 10.5 K/uL   RBC 3.37 (L) 3.87 - 5.11 MIL/uL   Hemoglobin 9.8 (L) 12.0 - 15.0 g/dL   HCT 33.3 (L) 36.0 - 46.0 %   MCV 98.8 78.0 - 100.0 fL   MCH 29.1 26.0 - 34.0 pg   MCHC 29.4 (L)  30.0 - 36.0 g/dL   RDW 17.1 (H) 11.5 - 15.5 %   Platelets 200 150 - 400 K/uL  Basic metabolic panel     Status: Abnormal   Collection Time: 12/13/15  2:42 AM  Result Value Ref Range   Sodium 137 135 - 145 mmol/L   Potassium 4.8 3.5 - 5.1 mmol/L   Chloride 101 101 - 111 mmol/L   CO2 28 22 - 32 mmol/L   Glucose, Bld 96 65 - 99 mg/dL   BUN 21 (H) 6 - 20 mg/dL   Creatinine, Ser 6.27 (H) 0.44 - 1.00 mg/dL   Calcium 8.5 (L) 8.9 - 10.3 mg/dL   GFR calc non Af Amer 6 (L) >60 mL/min   GFR calc Af Amer 6 (L) >60 mL/min    Comment: (NOTE) The eGFR has been calculated using the CKD EPI equation. This calculation has not been validated in all clinical situations. eGFR's persistently <60 mL/min signify possible Chronic Kidney Disease.    Anion gap 8 5 - 15  Glucose, capillary     Status: Abnormal    Collection Time: 12/13/15  8:02 AM  Result Value Ref Range   Glucose-Capillary 47 (L) 65 - 99 mg/dL   Comment 1 Notify RN   Glucose, capillary     Status: Abnormal   Collection Time: 12/13/15  8:26 AM  Result Value Ref Range   Glucose-Capillary 62 (L) 65 - 99 mg/dL   Comment 1 Notify RN   Glucose, capillary     Status: Abnormal   Collection Time: 12/13/15 11:10 AM  Result Value Ref Range   Glucose-Capillary 125 (H) 65 - 99 mg/dL   Comment 1 Notify RN     Dg Chest Portable 1 View  Result Date: 12/12/2015 CLINICAL DATA:  82 year old female with shortness of breath EXAM: PORTABLE CHEST 1 VIEW COMPARISON:  Chest radiograph dated 08/14/2015 FINDINGS: Single portable view of the chest demonstrate stable cardiomegaly with congestive changes, progressed from prior study. Chronic right lung base densities noted, however there is overall increased airspace density involving the mid to lower lung fields concerning for superimposed acute infection. Clinical correlation is recommended. Small bilateral pleural effusions. No pneumothorax. There is degenerative changes of the spine and shoulders. No acute fracture. IMPRESSION: Cardiomegaly with worsening of the congestive changes and interval development of small bilateral pleural effusions. Progression of mid to lower lung field airspace densities concerning for superimposed pneumonia. Clinical correlation is recommended. Electronically Signed   By: Anner Crete M.D.   On: 12/12/2015 03:48    ROS Blood pressure (!) 154/58, pulse 87, temperature 97.7 F (36.5 C), temperature source Oral, resp. rate 20, height 5' 2"  (1.575 m), weight 64.4 kg (141 lb 15.6 oz), SpO2 95 %. Physical Exam Physical Examination: General appearance - chronically ill appearing and obese.  NAD Mental status - alert, oriented to person, place, and time Eyes - pupils equal and reactive, extraocular eye movements intact, funduscopic exam normal, discs flat and sharp Mouth - poor  dentition, few teeth Neck - adenopathy noted PCL Lymphatics - posterior cervical nodes Chest - very little air movement, exp wheezes Heart - normal rate, regular rhythm, normal S1, S2, no murmurs, rubs, clicks or gallops, S1 and S2 normal, systolic murmur UM3/5 at 2nd left intercostal space Abdomen - obese, striae, liver down 3 cm, pos bs Extremities - AVF LUA B&T, no edma Skin - dry, abdm striae  Assessment/Plan: 1 Resp failure appears mostly COPD exac, mild vol xs,  2 ESRD:  will do HD in am. Use longer, slower UF 3 Hypertension: lower vol, cont meds 4. Anemia of ESRD: start esa, check Fe 5. Metabolic Bone Disease: check PTH, P 6 Obesity 7 Severe COPD 8 DM controlled now , needs outpatient mgmt P HD in am, esa, modify med. Check Fe, PTH  Nicole Cox L 12/13/2015, 11:20 AM

## 2015-12-13 NOTE — Progress Notes (Signed)
PROGRESS NOTE    [name]  ZOX:[id number] DOB: 04/01/1931 DOA: 12/12/2015 PCP: Joana Reamer, MD   Brief Narrative:  Nicole Cox is a pleasant 82 year old female with a history of end-stage renal disease on hemodialysis Tuesdays Thursdays and Saturdays, admitted to medicine service on 12/04/2015 when she presented with complaints of shortness of breath. Initial workup included a chest x-ray that showed worsening congestive changes and interval development of small bilateral pleural effusions. It was suspected that volume overload, COPD exacerbation, precipitated acute on chronic respiratory failure. Nephrology was consulted as she was taken to hemodialysis. She was also given steroids and nebulizer treatments.  Assessment & Plan:   Principal Problem:   Acute on chronic respiratory failure with hypoxia (HCC) Active Problems:   Hypertension   Diabetes mellitus (HCC)   COPD (chronic obstructive pulmonary disease) (HCC)   ESRD on hemodialysis (HCC)   Fluid overload  1.  Acute on chronic respiratory failure -Patient presenting in respiratory distress, having increased oxygen requirement, placed on NPPV overnight, suspect volume overload state along with possibly a superimposed COPD exacerbation precipitating symptoms -She underwent HD on 12/12/2015  -On 12/13/2015 she was assisted out of bed, de-satted to 87% on 2L on supplemental oxygen via [place]. On lung exam had increasing crackles as she reported feeling short of breath. I don't think she is quite ready for discharge, though I think she can be monitored in tele bad and transferred out of SDU.     2.  Acute on chronic diastolic heart failure -Presented with acute on chronic respiratory failure with chest x-ray showing pulmonary vascular congestion -Labs showed elevated BNP of 569.8 -End-stage renal disease patient, taken to hemodialysis  3.  Possible superimposed COPD exacerbation -I suspect pulmonary edema precipitating COPD  exacerbation -She was initially given dose of IV steroids, continue nebulizer treatments, supplemental oxygen.   4.  History of end-stage renal disease -She is a dialysis patient, Tuesday Thursday Saturday schedule -Nephrology consulted for dialysis given volume overload state  5.  Elevated troponin. -Lab showing stable troponin of 0.21 and 0.24. She denies chest pain, EKG did not reveal acute ischemic changes, as I suspect mild troponin elevation related to demand ischemia/CHF  -Continuous cardiac monitoring in place  6.  Insulin dependent diabetes mellitus -Continue Lantus 16 units subcutaneous at bedtime along with sliding scale coverage  7. Deconditioning -I got her out of bed this morning, noted generalized weakness, unsteadiness, will consult PT.    DVT prophylaxis: subcutaneous heparin Code Status: full code Family Communication:  Disposition Plan: Transfer to floor   Consultants:   Nephrology  Procedures:   HD  Antimicrobials:      Subjective: She denies CP, however reports having some shortness of breath. She was assisted to bedside chair.   Objective: Vitals:   12/13/15 0034 12/13/15 0249 12/13/15 0337 12/13/15 0716  BP: (!) 170/75 (!) 176/64  (!) 191/58  Pulse: 89 94 (!) 102 (!) 103  Resp: 16 17 19 19   Temp:  98.3 F (36.8 C)  98 F (36.7 C)  TempSrc:  Oral  Oral  SpO2: 100% 96% 100% 99%  Weight:      Height:        Intake/Output Summary (Last 24 hours) at 12/13/15 0827 Last data filed at 12/12/15 1141  Gross per 24 hour  Intake                0 ml  Output  1819 ml  Net            -1819 ml   Filed Weights   12/12/15 0615 12/12/15 0800 12/12/15 1141  Weight: 71.5 kg (157 lb 10.1 oz) 69.9 kg (154 lb 1.6 oz) 64.4 kg (141 lb 15.6 oz)    Examination:  General exam: Chronically ill-appearing, appears weak and deconditioned Respiratory system: She continues to have rales and coarse respiratory sounds Cardiovascular system: S1 & S2  heard, RRR. No JVD, murmurs, rubs, gallops or clicks, 1+ lower extremity edema Gastrointestinal system: Abdomen is nondistended, soft and nontender. No organomegaly or masses felt. Normal bowel sounds heard. Central nervous system: Alert and oriented. No focal neurological deficits. Extremities: Symmetric 5 x 5 power. Skin: No rashes, lesions or ulcers Psychiatry: Judgement and insight appear normal. Mood & affect appropriate.     Data Reviewed: I have personally reviewed following labs and imaging studies  CBC:  Recent Labs Lab 12/12/15 0310 12/13/15 0242  WBC 8.5 6.6  HGB 9.9* 9.8*  HCT 32.9* 33.3*  MCV 98.5 98.8  PLT 232 200   Basic Metabolic Panel:  Recent Labs Lab 12/12/15 0310 12/13/15 0242  NA 141 137  K 4.6 4.8  CL 102 101  CO2 27 28  GLUCOSE 86 96  BUN 29* 21*  CREATININE 7.96* 6.27*  CALCIUM 9.1 8.5*   GFR: Estimated Creatinine Clearance: 6.2 mL/min (by C-G formula based on SCr of 6.27 mg/dL (H)). Liver Function Tests: No results for input(s): AST, ALT, ALKPHOS, BILITOT, PROT, ALBUMIN in the last 168 hours. No results for input(s): LIPASE, AMYLASE in the last 168 hours. No results for input(s): AMMONIA in the last 168 hours. Coagulation Profile: No results for input(s): INR, PROTIME in the last 168 hours. Cardiac Enzymes:  Recent Labs Lab 12/12/15 0652 12/12/15 1353 12/12/15 1904  TROPONINI 0.24* 0.23* 0.22*   BNP (last 3 results) No results for input(s): PROBNP in the last 8760 hours. HbA1C: No results for input(s): HGBA1C in the last 72 hours. CBG:  Recent Labs Lab 12/12/15 1318 12/12/15 1744 12/12/15 1808 12/12/15 2104 12/13/15 0802  GLUCAP 80 59* 87 106* 47*   Lipid Profile: No results for input(s): CHOL, HDL, LDLCALC, TRIG, CHOLHDL, LDLDIRECT in the last 72 hours. Thyroid Function Tests: No results for input(s): TSH, T4TOTAL, FREET4, T3FREE, THYROIDAB in the last 72 hours. Anemia Panel: No results for input(s): VITAMINB12,  FOLATE, FERRITIN, TIBC, IRON, RETICCTPCT in the last 72 hours. Sepsis Labs: No results for input(s): PROCALCITON, LATICACIDVEN in the last 168 hours.  Recent Results (from the past 240 hour(s))  MRSA PCR Screening     Status: None   Collection Time: 12/12/15  7:25 AM  Result Value Ref Range Status   MRSA by PCR NEGATIVE NEGATIVE Final    Comment:        The GeneXpert MRSA Assay (FDA approved for NASAL specimens only), is one component of a comprehensive MRSA colonization surveillance program. It is not intended to diagnose MRSA infection nor to guide or monitor treatment for MRSA infections.          Radiology Studies: Dg Chest Portable 1 View  Result Date: 12/12/2015 CLINICAL DATA:  82 year old female with shortness of breath EXAM: PORTABLE CHEST 1 VIEW COMPARISON:  Chest radiograph dated 08/14/2015 FINDINGS: Single portable view of the chest demonstrate stable cardiomegaly with congestive changes, progressed from prior study. Chronic right lung base densities noted, however there is overall increased airspace density involving the mid to lower lung fields concerning  for superimposed acute infection. Clinical correlation is recommended. Small bilateral pleural effusions. No pneumothorax. There is degenerative changes of the spine and shoulders. No acute fracture. IMPRESSION: Cardiomegaly with worsening of the congestive changes and interval development of small bilateral pleural effusions. Progression of mid to lower lung field airspace densities concerning for superimposed pneumonia. Clinical correlation is recommended. Electronically Signed   By: Elgie CollardArash  Radparvar M.D.   On: 12/12/2015 03:48        Scheduled Meds: . dextrose      . calcium acetate  2,668 mg Oral TID WC  . cholecalciferol  1,000 Units Oral Daily  . cloNIDine  0.2 mg Oral BID  . diclofenac sodium  2 g Topical QID  . gabapentin  100 mg Oral QHS  . guaiFENesin  600 mg Oral BID  . heparin  5,000 Units  Subcutaneous Q8H  . insulin aspart  0-9 Units Subcutaneous TID WC  . insulin glargine  16 Units Subcutaneous QHS  . pravastatin  40 mg Oral Daily   Continuous Infusions:    LOS: 1 day    Time spent: 35 min    Jeralyn BennettZAMORA, Kiptyn Rafuse, MD [facility] Pager [phone]  If 7PM-7AM, please contact night-coverage [email or web address] Password [email or web address] 12/13/2015, 8:27 AM

## 2015-12-14 DIAGNOSIS — J441 Chronic obstructive pulmonary disease with (acute) exacerbation: Secondary | ICD-10-CM

## 2015-12-14 LAB — CBC
HEMATOCRIT: 30.8 % — AB (ref 36.0–46.0)
HEMOGLOBIN: 9.6 g/dL — AB (ref 12.0–15.0)
MCH: 29.7 pg (ref 26.0–34.0)
MCHC: 31.2 g/dL (ref 30.0–36.0)
MCV: 95.4 fL (ref 78.0–100.0)
Platelets: 207 10*3/uL (ref 150–400)
RBC: 3.23 MIL/uL — ABNORMAL LOW (ref 3.87–5.11)
RDW: 17 % — ABNORMAL HIGH (ref 11.5–15.5)
WBC: 7.9 10*3/uL (ref 4.0–10.5)

## 2015-12-14 LAB — IRON AND TIBC
IRON: 45 ug/dL (ref 28–170)
SATURATION RATIOS: 21 % (ref 10.4–31.8)
TIBC: 214 ug/dL — AB (ref 250–450)
UIBC: 169 ug/dL

## 2015-12-14 LAB — RENAL FUNCTION PANEL
ALBUMIN: 2.8 g/dL — AB (ref 3.5–5.0)
ANION GAP: 15 (ref 5–15)
BUN: 46 mg/dL — ABNORMAL HIGH (ref 6–20)
CHLORIDE: 95 mmol/L — AB (ref 101–111)
CO2: 22 mmol/L (ref 22–32)
Calcium: 8.2 mg/dL — ABNORMAL LOW (ref 8.9–10.3)
Creatinine, Ser: 8.13 mg/dL — ABNORMAL HIGH (ref 0.44–1.00)
GFR calc Af Amer: 5 mL/min — ABNORMAL LOW (ref >60)
GFR, EST NON AFRICAN AMERICAN: 4 mL/min — AB (ref >60)
Glucose, Bld: 65 mg/dL (ref 65–99)
PHOSPHORUS: 3.7 mg/dL (ref 2.5–4.6)
POTASSIUM: 5.2 mmol/L — AB (ref 3.5–5.1)
Sodium: 132 mmol/L — ABNORMAL LOW (ref 135–145)

## 2015-12-14 LAB — GLUCOSE, CAPILLARY
GLUCOSE-CAPILLARY: 285 mg/dL — AB (ref 65–99)
Glucose-Capillary: 106 mg/dL — ABNORMAL HIGH (ref 65–99)
Glucose-Capillary: 237 mg/dL — ABNORMAL HIGH (ref 65–99)

## 2015-12-14 MED ORDER — PENTAFLUOROPROP-TETRAFLUOROETH EX AERO: 1.0000 "application " | INHALATION_SPRAY | CUTANEOUS | Status: DC | PRN

## 2015-12-14 MED ORDER — LIDOCAINE HCL (PF) 1 % IJ SOLN: 5.0000 mL | INTRAMUSCULAR | Status: DC | PRN

## 2015-12-14 MED ORDER — PREDNISONE 50 MG PO TABS
60.0000 mg | ORAL_TABLET | Freq: Every day | ORAL | Status: DC
  Administered 2015-12-14 – 2015-12-16 (×3): 60 mg via ORAL
  Filled 2015-12-14 (×3): qty 1

## 2015-12-14 MED ORDER — HYDRALAZINE HCL 20 MG/ML IJ SOLN
10.0000 mg | INTRAMUSCULAR | Status: DC | PRN
  Administered 2015-12-14 – 2015-12-15 (×2): 10 mg via INTRAVENOUS
  Filled 2015-12-14 (×2): qty 1

## 2015-12-14 MED ORDER — ALTEPLASE 2 MG IJ SOLR
2.0000 mg | Freq: Once | INTRAMUSCULAR | Status: DC | PRN
Start: 2015-12-14 — End: 2015-12-14

## 2015-12-14 MED ORDER — ISOSORBIDE MONONITRATE ER 30 MG PO TB24
30.0000 mg | ORAL_TABLET | Freq: Every day | ORAL | Status: DC
  Administered 2015-12-14 – 2015-12-16 (×3): 30 mg via ORAL
  Filled 2015-12-14 (×3): qty 1

## 2015-12-14 MED ORDER — SODIUM CHLORIDE 0.9 % IV SOLN: 100.0000 mL | INTRAVENOUS | Status: DC | PRN

## 2015-12-14 MED ORDER — HEPARIN SODIUM (PORCINE) 1000 UNIT/ML DIALYSIS: 100.0000 [IU]/kg | INTRAMUSCULAR | Status: DC | PRN

## 2015-12-14 MED ORDER — CAMPHOR-MENTHOL 0.5-0.5 % EX LOTN
TOPICAL_LOTION | CUTANEOUS | Status: DC | PRN
  Administered 2015-12-14: 15:00:00 via TOPICAL
  Filled 2015-12-14: qty 222

## 2015-12-14 MED ORDER — IPRATROPIUM-ALBUTEROL 0.5-2.5 (3) MG/3ML IN SOLN
3.0000 mL | RESPIRATORY_TRACT | Status: DC
  Administered 2015-12-14 – 2015-12-15 (×3): 3 mL via RESPIRATORY_TRACT
  Filled 2015-12-14 (×3): qty 3

## 2015-12-14 MED ORDER — LIDOCAINE-PRILOCAINE 2.5-2.5 % EX CREA: 1.0000 "application " | TOPICAL_CREAM | CUTANEOUS | Status: DC | PRN

## 2015-12-14 MED ORDER — HEPARIN SODIUM (PORCINE) 1000 UNIT/ML DIALYSIS: 1000.0000 [IU] | INTRAMUSCULAR | Status: DC | PRN

## 2015-12-14 MED ORDER — DARBEPOETIN ALFA 100 MCG/0.5ML IJ SOSY
PREFILLED_SYRINGE | INTRAMUSCULAR | Status: AC
  Filled 2015-12-14: qty 0.5

## 2015-12-14 NOTE — Progress Notes (Signed)
PROGRESS NOTE    Nicole Cox  ZOX:[id number] DOB: [birth year] DOA: 12/12/2015 PCP: Joana Reamer, MD   Brief Narrative:  Nicole Cox is a pleasant 82 year old female with a history of end-stage renal disease on hemodialysis Tuesdays Thursdays and Saturdays, admitted to medicine service on 12/04/2015 when she presented with complaints of shortness of breath. Initial workup included a chest x-ray that showed worsening congestive changes and interval development of small bilateral pleural effusions. It was suspected that volume overload, COPD exacerbation, precipitated acute on chronic respiratory failure. Nephrology was consulted as she was taken to hemodialysis. She was also given steroids and nebulizer treatments.  Assessment & Plan:   Principal Problem:   Acute on chronic respiratory failure with hypoxia (HCC) Active Problems:   Hypertension   Diabetes mellitus (HCC)   COPD (chronic obstructive pulmonary disease) (HCC)   ESRD on hemodialysis (HCC)   Fluid overload  1.  Acute on chronic respiratory failure -Patient presenting in respiratory distress, having increased oxygen requirement, placed on NPPV overnight, suspect volume overload state along with possibly a superimposed COPD exacerbation precipitating symptoms -She underwent HD on 12/12/2015  -On 12/13/2015 she was assisted out of bed, de-satted to 87% on 2L on supplemental oxygen via [place]. On lung exam had increasing crackles as she reported feeling short of breath. I don't think she is quite ready for discharge, though I think she can be monitored in tele bad and transferred out of SDU.    -On 12/14/2015 she reported ongoing shortness of breath. Undergoing HD today. Will start her on prednisone 60 mg PO q daily today to treat for superimposed COPD exacerbation   2.  Acute on chronic diastolic heart failure -Presented with acute on chronic respiratory failure with chest x-ray showing pulmonary vascular congestion -Labs showed  elevated BNP of 569.8 -End-stage renal disease patient, taken to hemodialysis  3.  Possible superimposed COPD exacerbation -I suspect pulmonary edema precipitating COPD exacerbation -She was initially given dose of IV steroids, continue nebulizer treatments, supplemental oxygen.  -Starting Prednisone 60 mg PO q daily given lack of improvement in her respiratory status.   4.  History of end-stage renal disease -She is a dialysis patient, Tuesday Thursday Saturday schedule -Nephrology consulted for dialysis given volume overload state  5.  Elevated troponin. -Lab showing stable troponin of 0.21 and 0.24. She denies chest pain, EKG did not reveal acute ischemic changes, as I suspect mild troponin elevation related to demand ischemia/CHF  -Continuous cardiac monitoring in place  6.  Insulin dependent diabetes mellitus -Continue Lantus 16 units subcutaneous at bedtime along with sliding scale coverage  7. Deconditioning -I got her out of bed this morning, noted generalized weakness, unsteadiness, will consult PT.    DVT prophylaxis: subcutaneous heparin Code Status: full code Family Communication:  Disposition Plan:   Consultants:   Nephrology  Procedures:   HD  Antimicrobials:      Subjective: She complains of ongoing shortness of breath, denies CP  Objective: Vitals:   12/14/15 1035 12/14/15 1100 12/14/15 1131 12/14/15 1139  BP: 140/66 (!) 150/70  (!) 155/78  Pulse: 94 96  99  Resp: (!) 25 20  20   Temp:  98.3 F (36.8 C)  98.3 F (36.8 C)  TempSrc:  Oral  Oral  SpO2:   98% 95%  Weight:   69.2 kg (152 lb 8.9 oz)   Height:        Intake/Output Summary (Last 24 hours) at 12/14/15 1409 Last data filed at 12/14/15 1131  Gross per 24 hour  Intake              360 ml  Output             2000 ml  Net            -1640 ml   Filed Weights   12/13/15 2201 12/14/15 0705 12/14/15 1131  Weight: 71.7 kg (158 lb 1.1 oz) 70.4 kg (155 lb 3.3 oz) 69.2 kg (152 lb 8.9 oz)      Examination:  General exam: Chronically ill-appearing, appears weak and deconditioned Respiratory system: She continues to have rales and coarse respiratory sounds Cardiovascular system: S1 & S2 heard, RRR. No JVD, murmurs, rubs, gallops or clicks, 1+ lower extremity edema Gastrointestinal system: Abdomen is nondistended, soft and nontender. No organomegaly or masses felt. Normal bowel sounds heard. Central nervous system: Alert and oriented. No focal neurological deficits. Extremities: Symmetric 5 x 5 power. Skin: No rashes, lesions or ulcers Psychiatry: Judgement and insight appear normal. Mood & affect appropriate.     Data Reviewed: I have personally reviewed following labs and imaging studies  CBC:  Recent Labs Lab 12/12/15 0310 12/13/15 0242 12/14/15 0737  WBC 8.5 6.6 7.9  HGB 9.9* 9.8* 9.6*  HCT 32.9* 33.3* 30.8*  MCV 98.5 98.8 95.4  PLT 232 200 207   Basic Metabolic Panel:  Recent Labs Lab 12/12/15 0310 12/13/15 0242 12/14/15 0738  NA 141 137 132*  K 4.6 4.8 5.2*  CL 102 101 95*  CO2 27 28 22   GLUCOSE 86 96 65  BUN 29* 21* 46*  CREATININE 7.96* 6.27* 8.13*  CALCIUM 9.1 8.5* 8.2*  PHOS  --   --  3.7   GFR: Estimated Creatinine Clearance: 4.9 mL/min (by C-G formula based on SCr of 8.13 mg/dL (H)). Liver Function Tests:  Recent Labs Lab 12/14/15 0738  ALBUMIN 2.8*   No results for input(s): LIPASE, AMYLASE in the last 168 hours. No results for input(s): AMMONIA in the last 168 hours. Coagulation Profile: No results for input(s): INR, PROTIME in the last 168 hours. Cardiac Enzymes:  Recent Labs Lab 12/12/15 0652 12/12/15 1353 12/12/15 1904  TROPONINI 0.24* 0.23* 0.22*   BNP (last 3 results) No results for input(s): PROBNP in the last 8760 hours. HbA1C: No results for input(s): HGBA1C in the last 72 hours. CBG:  Recent Labs Lab 12/13/15 0826 12/13/15 1110 12/13/15 1645 12/13/15 2201 12/14/15 1142  GLUCAP 62* 125* 122* 162* 106*    Lipid Profile: No results for input(s): CHOL, HDL, LDLCALC, TRIG, CHOLHDL, LDLDIRECT in the last 72 hours. Thyroid Function Tests: No results for input(s): TSH, T4TOTAL, FREET4, T3FREE, THYROIDAB in the last 72 hours. Anemia Panel:  Recent Labs  12/14/15 0737  TIBC 214*  IRON 45   Sepsis Labs: No results for input(s): PROCALCITON, LATICACIDVEN in the last 168 hours.  Recent Results (from the past 240 hour(s))  MRSA PCR Screening     Status: None   Collection Time: 12/12/15  7:25 AM  Result Value Ref Range Status   MRSA by PCR NEGATIVE NEGATIVE Final    Comment:        The GeneXpert MRSA Assay (FDA approved for NASAL specimens only), is one component of a comprehensive MRSA colonization surveillance program. It is not intended to diagnose MRSA infection nor to guide or monitor treatment for MRSA infections.          Radiology Studies: No results found.      Scheduled Meds: .  calcium acetate  2,668 mg Oral TID WC  . cloNIDine  0.2 mg Oral BID  . Darbepoetin Alfa      . darbepoetin (ARANESP) injection - DIALYSIS  100 mcg Intravenous Q Sat-HD  . diclofenac sodium  2 g Topical QID  . gabapentin  100 mg Oral QHS  . guaiFENesin  600 mg Oral BID  . heparin  5,000 Units Subcutaneous Q8H  . insulin aspart  0-9 Units Subcutaneous TID WC  . insulin glargine  16 Units Subcutaneous QHS  . multivitamin  1 tablet Oral QHS  . pravastatin  40 mg Oral Daily  . predniSONE  60 mg Oral Q breakfast   Continuous Infusions:    LOS: 2 days    Time spent: 35 min    Jeralyn BennettZAMORA, Lyncoln Maskell, MD [facility] Pager [phone]  If 7PM-7AM, please contact night-coverage [email or web address] Password [email or web address] 12/14/2015, 2:09 PM

## 2015-12-14 NOTE — Progress Notes (Signed)
BP 218/77 MD notified.

## 2015-12-14 NOTE — Procedures (Signed)
I was present at this session.  I have reviewed the session itself and made appropriate changes.  HD vai LUA avf. bp 150s-160s sys, removing vol.  Access press ok.  Obert Espindola L 9/30/20178:34 AM

## 2015-12-14 NOTE — Evaluation (Signed)
Physical Therapy Evaluation Patient Details Name: Nicole Cox MRN: [id number] DOB: 02/11/1932 Today's Date: 12/14/2015   History of Present Illness  Patient is an 82 yo female admitted 12/12/15 with progressive dyspnea and dry cough.  Patient with acute on chronic resp failure with hypoxia, volume overload, COPD exacerbation.   PMH:  ESRD on HD, COPD, HTN, DM, CHF  Clinical Impression  Patient presents with problems listed below.  Will benefit from acute PT to maximize functional independence prior to discharge home with family.  Patient limited today by fatigue and dyspnea.  Should progress well as she improves medically.  Patient with 24 hour assist.  She declined recommendation for HHPT.  Will continue to follow in acute hospital.    Follow Up Recommendations Home health PT;Supervision for mobility/OOB (Patient declined HHPT)    Equipment Recommendations  None recommended by PT    Recommendations for Other Services       Precautions / Restrictions Precautions Precautions: Fall Restrictions Weight Bearing Restrictions: No      Mobility  Bed Mobility Overal bed mobility: Needs Assistance Bed Mobility: Rolling;Sidelying to Sit;Sit to Supine Rolling: Min guard Sidelying to sit: Min assist   Sit to supine: Min guard   General bed mobility comments: Use of rail for rolling.  Assist to bring trunk to upright sitting position.  Good balance once sitting.  Transfers Overall transfer level: Needs assistance Equipment used: Rolling walker (2 wheeled) Transfers: Sit to/from Stand Sit to Stand: Min assist         General transfer comment: Verbal cues for hand placement.  Assist to steady during transfer.  Patient stood x2 for approx 15-20 seconds and returned to sitting.  Fatigued quickly.  Ambulation/Gait             General Gait Details: NT  Stairs            Wheelchair Mobility    Modified Rankin (Stroke Patients Only)       Balance Overall balance  assessment: Needs assistance Sitting-balance support: No upper extremity supported;Feet supported Sitting balance-Leahy Scale: Good     Standing balance support: Bilateral upper extremity supported Standing balance-Leahy Scale: Poor                               Pertinent Vitals/Pain Pain Assessment: No/denies pain    Home Living Family/patient expects to be discharged to:: Private residence Living Arrangements: Children;Other relatives (Son, Daughter, son-in-law, grandson) Available Help at Discharge: Family;Available 24 hours/day Type of Home: House Home Access: Level entry     Home Layout: Two level;Able to live on main level with bedroom/bathroom Home Equipment: Dan Humphreys - 2 wheels;Walker - 4 wheels;Cane - single point;Bedside commode;Shower seat      Prior Function Level of Independence: Independent with assistive device(s);Needs assistance   Gait / Transfers Assistance Needed: Uses RW for gait  ADL's / Homemaking Assistance Needed: Daughter assists with bathing, meal prep, housekeeping        Hand Dominance   Dominant Hand: Right    Extremity/Trunk Assessment   Upper Extremity Assessment: Generalized weakness           Lower Extremity Assessment: Generalized weakness         Communication   Communication: No difficulties  Cognition Arousal/Alertness: Awake/alert Behavior During Therapy: WFL for tasks assessed/performed;Flat affect Overall Cognitive Status: Within Functional Limits for tasks assessed  General Comments      Exercises     Assessment/Plan    PT Assessment Patient needs continued PT services  PT Problem List Decreased strength;Decreased activity tolerance;Decreased balance;Decreased mobility;Decreased knowledge of use of DME;Cardiopulmonary status limiting activity          PT Treatment Interventions DME instruction;Gait training;Functional mobility training;Therapeutic  activities;Therapeutic exercise;Patient/family education    PT Goals (Current goals can be found in the Care Plan section)  Acute Rehab PT Goals Patient Stated Goal: To go home PT Goal Formulation: With patient Time For Goal Achievement: 12/21/15 Potential to Achieve Goals: Good    Frequency Min 3X/week   Barriers to discharge        Co-evaluation               End of Session Equipment Utilized During Treatment: Gait belt;Oxygen Activity Tolerance: Patient limited by fatigue Patient left: in bed;with call bell/phone within reach Nurse Communication: Mobility status (Back itching)         Time: 5784-69621439-1453 PT Time Calculation (min) (ACUTE ONLY): 14 min   Charges:   PT Evaluation $PT Eval Moderate Complexity: 1 Procedure     PT G CodesVena Austria:        Kynnedy Carreno H 12/14/2015, 4:44 PM Durenda HurtSusan H. Renaldo Fiddleravis, PT, [facility] Acute Rehab Services Pager [phone]

## 2015-12-14 NOTE — Progress Notes (Signed)
Subjective: Interval History: has  complaint still sob.  Objective: Vital signs in last 24 hours: Temp:  [97.7 F (36.5 C)-98.8 F (37.1 C)] 98 F (36.7 C) (09/30 0700) Pulse Rate:  [80-100] 81 (09/30 0705) Resp:  [16-23] 21 (09/30 0705) BP: (154-195)/(53-69) 155/65 (09/30 0705) SpO2:  [93 %-100 %] 97 % (09/30 0700) Weight:  [70.4 kg (155 lb 3.3 oz)-71.7 kg (158 lb 1.1 oz)] 70.4 kg (155 lb 3.3 oz) (09/30 0705) Weight change: 1.8 kg (3 lb 15.5 oz)  Intake/Output from previous day: 09/29 0701 - 09/30 0700 In: 960 [P.O.:960] Out: 0  Intake/Output this shift: No intake/output data recorded.  General appearance: alert, cooperative, no distress and moderately obese Resp: diminished breath sounds bilaterally and more air movement than yest Cardio: S1, S2 normal and systolic murmur: holosystolic 2/6, blowing at apex GI: obese, pos bs ,liver down 5 cm Extremities: edema 1+ and AVF LUA   Lab Results:  Recent Labs  12/13/15 0242 12/14/15 0737  WBC 6.6 7.9  HGB 9.8* 9.6*  HCT 33.3* 30.8*  PLT 200 207   BMET:  Recent Labs  12/13/15 0242 12/14/15 0738  NA 137 132*  K 4.8 5.2*  CL 101 95*  CO2 28 22  GLUCOSE 96 65  BUN 21* 46*  CREATININE 6.27* 8.13*  CALCIUM 8.5* 8.2*   No results for input(s): PTH in the last 72 hours. Iron Studies: No results for input(s): IRON, TIBC, TRANSFERRIN, FERRITIN in the last 72 hours.  Studies/Results: No results found.  I have reviewed the patient's current medications.  Assessment/Plan: 1 ESRD for HD.  Slow UFR, see if improves resp 2 Anemia esa 3 HPTH vit D 4 DM 5 COPD severe still primary issue 6 Debill P HD, lower vol, COPD meds    LOS: 2 days   Ashyla Luth L 12/14/2015,8:35 AM

## 2015-12-15 ENCOUNTER — Inpatient Hospital Stay (HOSPITAL_COMMUNITY): Payer: Medicare HMO

## 2015-12-15 LAB — GLUCOSE, CAPILLARY
GLUCOSE-CAPILLARY: 243 mg/dL — AB (ref 65–99)
Glucose-Capillary: 168 mg/dL — ABNORMAL HIGH (ref 65–99)
Glucose-Capillary: 157 mg/dL — ABNORMAL HIGH (ref 65–99)
Glucose-Capillary: 240 mg/dL — ABNORMAL HIGH (ref 65–99)

## 2015-12-15 LAB — PARATHYROID HORMONE, INTACT (NO CA): PTH: 376 pg/mL — AB (ref 15–65)

## 2015-12-15 MED ORDER — IPRATROPIUM-ALBUTEROL 0.5-2.5 (3) MG/3ML IN SOLN
3.0000 mL | Freq: Three times a day (TID) | RESPIRATORY_TRACT | Status: DC
  Administered 2015-12-15 – 2015-12-16 (×4): 3 mL via RESPIRATORY_TRACT
  Filled 2015-12-15 (×4): qty 3

## 2015-12-15 NOTE — Progress Notes (Signed)
Physical Therapy Treatment Patient Details Name: Al DecantLizzie Helbing MRN: [id number] DOB: [birth year] Today's Date: 12/15/2015    History of Present Illness Patient is an 82 yo female admitted 12/12/15 with progressive dyspnea and dry cough.  Patient with acute on chronic resp failure with hypoxia, volume overload, COPD exacerbation.   PMH:  ESRD on HD, COPD, HTN, DM, CHF    PT Comments    Patient doing well with mobility and gait.    Follow Up Recommendations  Home health PT;Supervision for mobility/OOB (Patient declined HHPT)     Equipment Recommendations  None recommended by PT    Recommendations for Other Services       Precautions / Restrictions Precautions Precautions: Fall Restrictions Weight Bearing Restrictions: No    Mobility  Bed Mobility               General bed mobility comments: Patient up in chair  Transfers Overall transfer level: Needs assistance Equipment used: Rolling walker (2 wheeled) Transfers: Sit to/from Stand Sit to Stand: Min guard         General transfer comment: Verbal cues for hand placement.  Assist for safety/balance only.  Ambulation/Gait Ambulation/Gait assistance: Min guard Ambulation Distance (Feet): 40 Feet Assistive device: Rolling walker (2 wheeled) Gait Pattern/deviations: Step-through pattern;Decreased stride length;Trunk flexed Gait velocity: decreased Gait velocity interpretation: Below normal speed for age/gender General Gait Details: Patient demonstrates safe use of RW.  Slow steady gait pattern.  Ambulates with O2 with no dyspnea noted.  Assist for safety only.   Stairs            Wheelchair Mobility    Modified Rankin (Stroke Patients Only)       Balance           Standing balance support: Bilateral upper extremity supported Standing balance-Leahy Scale: Poor                      Cognition Arousal/Alertness: Awake/alert Behavior During Therapy: WFL for tasks assessed/performed;Flat  affect Overall Cognitive Status: Within Functional Limits for tasks assessed                      Exercises      General Comments        Pertinent Vitals/Pain Pain Assessment: No/denies pain (Denies pain, but then tells RN her head still hurts.)    Home Living                      Prior Function            PT Goals (current goals can now be found in the care plan section) Progress towards PT goals: Progressing toward goals    Frequency    Min 3X/week      PT Plan Current plan remains appropriate    Co-evaluation             End of Session Equipment Utilized During Treatment: Gait belt;Oxygen Activity Tolerance: Patient limited by fatigue Patient left: in chair;with call bell/phone within reach;with chair alarm set;with nursing/sitter in room     Time: [id number] PT Time Calculation (min) (ACUTE ONLY): 18 min  Charges:  $Gait Training: 8-22 mins                    G Codes:      Vena AustriaDavis, Quaniyah Bugh H 12/15/2015, 6:33 PM Durenda HurtSusan H. Renaldo Fiddleravis, PT, [facility] Acute Rehab Services Pager [phone]

## 2015-12-15 NOTE — Progress Notes (Signed)
PROGRESS NOTE    Nicole Cox  ZOX:[id number] DOB: [birth year] DOA: 12/12/2015 PCP: Joana Reamer, MD   Brief Narrative:  Nicole Cox is a pleasant 82 year old female with a history of end-stage renal disease on hemodialysis Tuesdays Thursdays and Saturdays, admitted to medicine service on 12/04/2015 when she presented with complaints of shortness of breath. Initial workup included a chest x-ray that showed worsening congestive changes and interval development of small bilateral pleural effusions. It was suspected that volume overload, COPD exacerbation, precipitated acute on chronic respiratory failure. Nephrology was consulted as she was taken to hemodialysis. She was also given steroids and nebulizer treatments.  Assessment & Plan:   Principal Problem:   Acute on chronic respiratory failure with hypoxia (HCC) Active Problems:   Hypertension   Diabetes mellitus (HCC)   COPD (chronic obstructive pulmonary disease) (HCC)   ESRD on hemodialysis (HCC)   Fluid overload  1.  Acute on chronic respiratory failure -Patient presenting in respiratory distress, having increased oxygen requirement, placed on NPPV overnight, suspect volume overload state along with possibly a superimposed COPD exacerbation precipitating symptoms -She underwent HD on 12/12/2015  -On 12/13/2015 she was assisted out of bed, de-satted to 87% on 2L on supplemental oxygen via [place]. On lung exam had increasing crackles as she reported feeling short of breath. I don't think she is quite ready for discharge, though I think she can be monitored in tele bad and transferred out of SDU.    -On 12/14/2015 she reported ongoing shortness of breath. Undergoing HD today. Will start her on prednisone 60 mg PO q daily today to treat for superimposed COPD exacerbation  -On 12/15/2015 she is showing improvement in her respiratory status.   2.  Acute on chronic diastolic heart failure -Presented with acute on chronic respiratory failure with  chest x-ray showing pulmonary vascular congestion -Labs showed elevated BNP of 569.8 -End-stage renal disease patient, taken to hemodialysis  3.  Possible superimposed COPD exacerbation -I suspect pulmonary edema precipitating COPD exacerbation -She was initially given dose of IV steroids, continue nebulizer treatments, supplemental oxygen.  -Starting Prednisone 60 mg PO q daily given lack of improvement in her respiratory status.   4.  History of end-stage renal disease -She is a dialysis patient, Tuesday Thursday Saturday schedule -Nephrology consulted for dialysis given volume overload state  5.  Elevated troponin. -Lab showing stable troponin of 0.21 and 0.24. She denies chest pain, EKG did not reveal acute ischemic changes, as I suspect mild troponin elevation related to demand ischemia/CHF  -Continuous cardiac monitoring in place  6.  Insulin dependent diabetes mellitus -Continue Lantus 16 units subcutaneous at bedtime along with sliding scale coverage  7. Deconditioning -PT recommending HH services.    DVT prophylaxis: subcutaneous heparin Code Status: full code Family Communication: I spoke to her daughter over telephone Disposition Plan: Anticipate discharge in the next 24 hours.   Consultants:   Nephrology  Procedures:   HD  Antimicrobials:      Subjective: She states shortness of breath improved.   Objective: Vitals:   12/15/15 0002 12/15/15 0606 12/15/15 0758 12/15/15 0937  BP:  (!) 159/64  (!) 138/52  Pulse:  84  76  Resp:  18  17  Temp:  98.6 F (37 C)  98.3 F (36.8 C)  TempSrc:  Oral  Oral  SpO2: 99% 97% 97% 99%  Weight:      Height:        Intake/Output Summary (Last 24 hours) at 12/15/15 1423 Last data  filed at 12/15/15 1105  Gross per 24 hour  Intake              600 ml  Output                0 ml  Net              600 ml   Filed Weights   12/13/15 2201 12/14/15 0705 12/14/15 1131  Weight: 71.7 kg (158 lb 1.1 oz) 70.4 kg (155 lb  3.3 oz) 69.2 kg (152 lb 8.9 oz)    Examination:  General exam: Chronically ill-appearing, appears weak and deconditioned Respiratory system: Interim improvement to her respiratory status Cardiovascular system: S1 & S2 heard, RRR. No JVD, murmurs, rubs, gallops or clicks, 1+ lower extremity edema Gastrointestinal system: Abdomen is nondistended, soft and nontender. No organomegaly or masses felt. Normal bowel sounds heard. Central nervous system: Alert and oriented. No focal neurological deficits. Extremities: Symmetric 5 x 5 power. Skin: No rashes, lesions or ulcers Psychiatry: Judgement and insight appear normal. Mood & affect appropriate.     Data Reviewed: I have personally reviewed following labs and imaging studies  CBC:  Recent Labs Lab 12/12/15 0310 12/13/15 0242 12/14/15 0737  WBC 8.5 6.6 7.9  HGB 9.9* 9.8* 9.6*  HCT 32.9* 33.3* 30.8*  MCV 98.5 98.8 95.4  PLT 232 200 207   Basic Metabolic Panel:  Recent Labs Lab 12/12/15 0310 12/13/15 0242 12/14/15 0738  NA 141 137 132*  K 4.6 4.8 5.2*  CL 102 101 95*  CO2 27 28 22   GLUCOSE 86 96 65  BUN 29* 21* 46*  CREATININE 7.96* 6.27* 8.13*  CALCIUM 9.1 8.5* 8.2*  PHOS  --   --  3.7   GFR: Estimated Creatinine Clearance: 4.9 mL/min (by C-G formula based on SCr of 8.13 mg/dL (H)). Liver Function Tests:  Recent Labs Lab 12/14/15 0738  ALBUMIN 2.8*   No results for input(s): LIPASE, AMYLASE in the last 168 hours. No results for input(s): AMMONIA in the last 168 hours. Coagulation Profile: No results for input(s): INR, PROTIME in the last 168 hours. Cardiac Enzymes:  Recent Labs Lab 12/12/15 0652 12/12/15 1353 12/12/15 1904  TROPONINI 0.24* 0.23* 0.22*   BNP (last 3 results) No results for input(s): PROBNP in the last 8760 hours. HbA1C: No results for input(s): HGBA1C in the last 72 hours. CBG:  Recent Labs Lab 12/14/15 1142 12/14/15 1658 12/14/15 2229 12/15/15 0754 12/15/15 1141  GLUCAP 106*  237* 285* 168* 157*   Lipid Profile: No results for input(s): CHOL, HDL, LDLCALC, TRIG, CHOLHDL, LDLDIRECT in the last 72 hours. Thyroid Function Tests: No results for input(s): TSH, T4TOTAL, FREET4, T3FREE, THYROIDAB in the last 72 hours. Anemia Panel:  Recent Labs  12/14/15 0737  TIBC 214*  IRON 45   Sepsis Labs: No results for input(s): PROCALCITON, LATICACIDVEN in the last 168 hours.  Recent Results (from the past 240 hour(s))  MRSA PCR Screening     Status: None   Collection Time: 12/12/15  7:25 AM  Result Value Ref Range Status   MRSA by PCR NEGATIVE NEGATIVE Final    Comment:        The GeneXpert MRSA Assay (FDA approved for NASAL specimens only), is one component of a comprehensive MRSA colonization surveillance program. It is not intended to diagnose MRSA infection nor to guide or monitor treatment for MRSA infections.          Radiology Studies: Dg Chest Port 1  View  Result Date: 12/15/2015 CLINICAL DATA:  82 year old female with history of shortness of breath. EXAM: PORTABLE CHEST 1 VIEW COMPARISON:  Chest x-ray 12/12/2015. FINDINGS: There are mass-like areas of consolidation throughout the right mid to lower lung, which have progressively increased on numerous prior examinations dating back to 08/11/2012. The appearance is unusual, and the high density the area suggests some degree of calcification in the underlying process. This may simply reflect sequela of recurrent aspiration with extensive partially calcified pleural parenchymal scarring from recurrent infection/inflammation, however, underlying neoplastic process is difficult to entirely exclude. More ill-defined airspace disease is also noted throughout this region, likely to reflect an acute process. Small bilateral pleural effusions (right greater than left). Mild cephalization of the pulmonary vasculature. Diffusely increased interstitial markings. Mild cardiomegaly. The patient is rotated to the right  on today's exam, resulting in distortion of the mediastinal contours and reduced diagnostic sensitivity and specificity for mediastinal pathology. Atherosclerosis in the thoracic aorta. IMPRESSION: 1. The appearance of the chest suggests mild congestive heart failure, as above. 2. In addition, there is more focal airspace consolidation throughout the right mid to lower lung, some of which appears likely acute while much of this appears to be chronic and partially calcified. This appearance is unusual, but can be seen, particularly in the setting of underlying renal disease. This area could be better evaluated with followup nonemergent chest CT, preferably with IV contrast if possible. Underlying malignancy is not favored, but is difficult to exclude, as no prior CT imaging of the thorax has been obtained. Electronically Signed   By: Trudie Reed M.D.   On: 12/15/2015 08:07        Scheduled Meds: . calcium acetate  2,668 mg Oral TID WC  . cloNIDine  0.2 mg Oral BID  . darbepoetin (ARANESP) injection - DIALYSIS  100 mcg Intravenous Q Sat-HD  . diclofenac sodium  2 g Topical QID  . gabapentin  100 mg Oral QHS  . guaiFENesin  600 mg Oral BID  . heparin  5,000 Units Subcutaneous Q8H  . insulin aspart  0-9 Units Subcutaneous TID WC  . insulin glargine  16 Units Subcutaneous QHS  . ipratropium-albuterol  3 mL Nebulization TID  . isosorbide mononitrate  30 mg Oral Daily  . multivitamin  1 tablet Oral QHS  . pravastatin  40 mg Oral Daily  . predniSONE  60 mg Oral Q breakfast   Continuous Infusions:    LOS: 3 days    Time spent: 25 min    Jeralyn Bennett, MD [facility] Pager [phone]  If 7PM-7AM, please contact night-coverage [email or web address] Password [email or web address] 12/15/2015, 2:23 PM

## 2015-12-15 NOTE — Progress Notes (Signed)
Subjective: Interval History: has no complaint, breathing better.  Objective: Vital signs in last 24 hours: Temp:  [98.1 F (36.7 C)-98.6 F (37 C)] 98.3 F (36.8 C) (10/01 0937) Pulse Rate:  [76-103] 76 (10/01 0937) Resp:  [17-25] 17 (10/01 0937) BP: (99-218)/(46-78) 138/52 (10/01 0937) SpO2:  [95 %-100 %] 99 % (10/01 0937) Weight:  [69.2 kg (152 lb 8.9 oz)] 69.2 kg (152 lb 8.9 oz) (09/30 1131) Weight change: -1.3 kg (-2 lb 13.9 oz)  Intake/Output from previous day: 09/30 0701 - 10/01 0700 In: 840 [P.O.:840] Out: 2000  Intake/Output this shift: No intake/output data recorded.  General appearance: alert, cooperative and moderately obese Resp: diminished breath sounds bilaterally and rales bibasilar Cardio: S1, S2 normal and systolic murmur: holosystolic 2/6, blowing at lower left sternal border GI: obese, pos bs,liver down 6 cm Extremities: edema 1+ and AVF LUA B&T  Lab Results:  Recent Labs  12/13/15 0242 12/14/15 0737  WBC 6.6 7.9  HGB 9.8* 9.6*  HCT 33.3* 30.8*  PLT 200 207   BMET:  Recent Labs  12/13/15 0242 12/14/15 0738  NA 137 132*  K 4.8 5.2*  CL 101 95*  CO2 28 22  GLUCOSE 96 65  BUN 21* 46*  CREATININE 6.27* 8.13*  CALCIUM 8.5* 8.2*   No results for input(s): PTH in the last 72 hours. Iron Studies:  Recent Labs  12/14/15 0737  IRON 45  TIBC 214*    Studies/Results: Dg Chest Port 1 View  Result Date: 12/15/2015 CLINICAL DATA:  82 year old female with history of shortness of breath. EXAM: PORTABLE CHEST 1 VIEW COMPARISON:  Chest x-ray 12/12/2015. FINDINGS: There are mass-like areas of consolidation throughout the right mid to lower lung, which have progressively increased on numerous prior examinations dating back to 08/11/2012. The appearance is unusual, and the high density the area suggests some degree of calcification in the underlying process. This may simply reflect sequela of recurrent aspiration with extensive partially calcified  pleural parenchymal scarring from recurrent infection/inflammation, however, underlying neoplastic process is difficult to entirely exclude. More ill-defined airspace disease is also noted throughout this region, likely to reflect an acute process. Small bilateral pleural effusions (right greater than left). Mild cephalization of the pulmonary vasculature. Diffusely increased interstitial markings. Mild cardiomegaly. The patient is rotated to the right on today's exam, resulting in distortion of the mediastinal contours and reduced diagnostic sensitivity and specificity for mediastinal pathology. Atherosclerosis in the thoracic aorta. IMPRESSION: 1. The appearance of the chest suggests mild congestive heart failure, as above. 2. In addition, there is more focal airspace consolidation throughout the right mid to lower lung, some of which appears likely acute while much of this appears to be chronic and partially calcified. This appearance is unusual, but can be seen, particularly in the setting of underlying renal disease. This area could be better evaluated with followup nonemergent chest CT, preferably with IV contrast if possible. Underlying malignancy is not favored, but is difficult to exclude, as no prior CT imaging of the thorax has been obtained. Electronically Signed   By: Trudie Reedaniel  Entrikin M.D.   On: 12/15/2015 08:07    I have reviewed the patient's current medications.  Assessment/Plan: 1 ESRD vol  And solute better.  Not primary issue 2 COPD improving 3 Anemia stable on ESA 4 HPTH 5 Debill 6 CAD 7 DM controlled P HD on Tues,  ? Baseline and d/c anytime   LOS: 3 days   Carlyann Placide L 12/15/2015,10:21 AM

## 2015-12-15 NOTE — Progress Notes (Signed)
Pt has old bipap order.  It is not needed at this time.  RT will monitor.

## 2015-12-16 LAB — GLUCOSE, CAPILLARY
GLUCOSE-CAPILLARY: 191 mg/dL — AB (ref 65–99)
Glucose-Capillary: 151 mg/dL — ABNORMAL HIGH (ref 65–99)

## 2015-12-16 MED ORDER — INSULIN ASPART 100 UNIT/ML ~~LOC~~ SOLN: 6.0000 [IU] | Freq: Three times a day (TID) | SUBCUTANEOUS | 11 refills | Status: DC

## 2015-12-16 MED ORDER — PREDNISONE 10 MG (21) PO TBPK: ORAL_TABLET | ORAL | 0 refills | Status: DC

## 2015-12-16 NOTE — Care Management Important Message (Signed)
Important Message  Patient Details  Name: Al DecantLizzie Masin MRN: [id number] Date of Birth: [birth year]   Medicare Important Message Given:  Yes    Cale Bethard 12/16/2015, 1:34 PM

## 2015-12-16 NOTE — Care Management Note (Signed)
Case Management Note  Patient Details  Name: Al DecantLizzie Keirsey MRN: [id number] Date of Birth: 1931-09-08  Subjective/Objective: Pt presents with acute on chronic resp failure with hypoxia, htn, dm,  Copd, ESRD- HD T,TH, SAT. Pt is from home with spouse and  the support of daughter. Pt has DME: [employer], BSC, Shower Chair and home oxygen with [facility] 2 liters.              Action/Plan: [facility]. [facility]. Pt states to call daughter [name] [facility]. Per [name] pt has had HH Services in the past and they have not been beneficial. No services set up at this time. CM did make sure Staff RN was aware. Family to provide transportation home. No further needs from CM at this time.   Expected Discharge Date:                  Expected Discharge Plan:  Home/Self Care  In-House Referral:  NA  Discharge planning Services  CM Consult  Post Acute Care Choice:  NA Choice offered to:  NA  DME Arranged:  N/A DME Agency:  NA  HH Arranged:  NA HH Agency:  NA  Status of Service:  Completed, signed off  If discussed at Long Length of Stay Meetings, dates discussed:    Additional Comments:  Gala LewandowskyGraves-Bigelow, Ethelreda Sukhu Kaye, RN 12/16/2015, 9:46 AM

## 2015-12-16 NOTE — Progress Notes (Signed)
Subjective: Interval History: going home with home health today   Objective: Vital signs in last 24 hours: Temp:  [98.2 F (36.8 C)-98.8 F (37.1 C)] 98.2 F (36.8 C) (10/02 0800) Pulse Rate:  [76-86] 84 (10/02 0800) Resp:  [16-18] 18 (10/02 0800) BP: (147-175)/(53-59) 152/59 (10/02 0800) SpO2:  [96 %-100 %] 98 % (10/02 0912) Weight change:   Intake/Output from previous day: 10/01 0701 - 10/02 0700 In: 1200 [P.O.:1200] Out: 0  Intake/Output this shift: Total I/O In: 240 [P.O.:240] Out: 0   General appearance: alert, cooperative and moderately obese Resp: diminished breath sounds bilaterally and rales bibasilar Cardio: S1, S2 normal and systolic murmur: holosystolic 2/6, blowing at lower left sternal border GI: obese, pos bs,liver down 6 cm Extremities: edema 1+ and AVF LUA B&T  Lab Results:  Recent Labs  12/14/15 0737  WBC 7.9  HGB 9.6*  HCT 30.8*  PLT 207   BMET:   Recent Labs  12/14/15 0738  NA 132*  K 5.2*  CL 95*  CO2 22  GLUCOSE 65  BUN 46*  CREATININE 8.13*  CALCIUM 8.2*    Recent Labs  12/14/15 0730  PTH 376*   Iron Studies:   Recent Labs  12/14/15 0737  IRON 45  TIBC 214*    Studies/Results: Dg Chest Port 1 View  Result Date: 12/15/2015 CLINICAL DATA:  82 year old female with history of shortness of breath. EXAM: PORTABLE CHEST 1 VIEW COMPARISON:  Chest x-ray 12/12/2015. FINDINGS: There are mass-like areas of consolidation throughout the right mid to lower lung, which have progressively increased on numerous prior examinations dating back to 08/11/2012. The appearance is unusual, and the high density the area suggests some degree of calcification in the underlying process. This may simply reflect sequela of recurrent aspiration with extensive partially calcified pleural parenchymal scarring from recurrent infection/inflammation, however, underlying neoplastic process is difficult to entirely exclude. More ill-defined airspace disease is  also noted throughout this region, likely to reflect an acute process. Small bilateral pleural effusions (right greater than left). Mild cephalization of the pulmonary vasculature. Diffusely increased interstitial markings. Mild cardiomegaly. The patient is rotated to the right on today's exam, resulting in distortion of the mediastinal contours and reduced diagnostic sensitivity and specificity for mediastinal pathology. Atherosclerosis in the thoracic aorta. IMPRESSION: 1. The appearance of the chest suggests mild congestive heart failure, as above. 2. In addition, there is more focal airspace consolidation throughout the right mid to lower lung, some of which appears likely acute while much of this appears to be chronic and partially calcified. This appearance is unusual, but can be seen, particularly in the setting of underlying renal disease. This area could be better evaluated with followup nonemergent chest CT, preferably with IV contrast if possible. Underlying malignancy is not favored, but is difficult to exclude, as no prior CT imaging of the thorax has been obtained. Electronically Signed   By: Trudie Reedaniel  Entrikin M.D.   On: 12/15/2015 08:07    I have reviewed the patient's current medications.  Assessment/Plan: 1 ESRD vol  And solute better.  Not primary issue.  Will go back to home unit tomorrow with a lower EDW  2 COPD improving 3 Anemia stable on ESA 4 HPTH 5 Debill 6 CAD 7 DM controlled P HD on Tues- going home today - back to home unit tomorrow   LOS: 4 days   Avielle Imbert A 12/16/2015,1:37 PM

## 2015-12-16 NOTE — Discharge Summary (Signed)
Physician Discharge Summary  [name] WUJ:[id number] DOB: 24-May-1931 DOA: 12/12/2015  PCP: Joana Reamer, MD  Admit date: 12/12/2015 Discharge date: 12/16/2015  Time spent: 35 minutes  Recommendations for Outpatient Follow-up:  1. Please follow up on blood pressures she was hypertensive during this hospitalization, please follow up on blood pressures 2. She was treated for COPD exacerbation, discharged on Prednisone taper, please follow up on respiratory status 3. She was set up with Home Health Services prior to discharge  Discharge Diagnoses:  Principal Problem:   Acute on chronic respiratory failure with hypoxia (HCC) Active Problems:   Hypertension   Diabetes mellitus (HCC)   COPD (chronic obstructive pulmonary disease) (HCC)   ESRD on hemodialysis (HCC)   Fluid overload   Discharge Condition: Stable  Diet recommendation: Renal/Carb mod diet  Filed Weights   12/13/15 2201 12/14/15 0705 12/14/15 1131  Weight: 71.7 kg (158 lb 1.1 oz) 70.4 kg (155 lb 3.3 oz) 69.2 kg (152 lb 8.9 oz)    History of present illness:  Nicole Cox is a 82 y.o. female with medical history significant of ESRD on HD(T/TH/Sat), chronic respiratory failure due to COPD, oxygen dependent on 2L, CHF, HTN, chronic anemia, DM2; who presents with a three-day history of progressively worsening shortness of breath. Associated symptoms include nonproductive cough  Denies any fever or chills. Patient had called EMS earlier yesterday, but was not transported to the hospital as she felt better after nebulized treatment. However, patient breathing subsequently declined and despite giving herself 3 home nebulized treatments was not feeling better. Per review of records, the patient was just recently hospitalized at [facility] from 8/23-8/27 for acute COPD exacerbation treated with azithromycin and steroid taper. She was admitted to [facility] in 07/2015, and again at [facility] in 06/2015  with similar symptoms thought to be related to fluid overload. Discharge weight previously was 141-143 back in 08/2015.  Hospital Course:  Nicole Cox is a pleasant 82 year old female with a history of end-stage renal disease on hemodialysis Tuesdays Thursdays and Saturdays, admitted to medicine service on 12/04/2015 when she presented with complaints of shortness of breath. Initial workup included a chest x-ray that showed worsening congestive changes and interval development of small bilateral pleural effusions. It was suspected that volume overload, COPD exacerbation, precipitated acute on chronic respiratory failure. Nephrology was consulted as she was taken to hemodialysis. She was also given steroids and nebulizer treatments.  1.  Acute on chronic respiratory failure -Patient presenting in respiratory distress, having increased oxygen requirement, placed on NPPV overnight, suspect volume overload state along with possibly a superimposed COPD exacerbation precipitating symptoms -She underwent HD on 12/12/2015  -On 12/13/2015 she was assisted out of bed, de-satted to 87% on 2L on supplemental oxygen via [place]. On lung exam had increasing crackles as she reported feeling short of breath. I don't think she is quite ready for discharge, though I think she can be monitored in tele bad and transferred out of SDU.    -On 12/14/2015 she reported ongoing shortness of breath. Undergoing HD today. Will start her on prednisone 60 mg PO q daily today to treat for superimposed COPD exacerbation  -On 12/15/2015 she is showing improvement in her respiratory status.  -Discharged home with Home Health Services on 12/16/2015  2.  Acute on chronic diastolic heart failure -Presented with acute on chronic respiratory failure with chest x-ray showing pulmonary vascular congestion -Labs showed elevated BNP of 569.8 -End-stage renal disease patient, taken to hemodialysis  3.  Possible superimposed COPD exacerbation -I  suspect pulmonary edema precipitating COPD exacerbation -She was initially given dose of IV steroids, continue nebulizer treatments, supplemental oxygen.  -Starting Prednisone 60 mg PO q daily given lack of improvement in her respiratory status.  -Discharged on Prednisone Taper  4.  History of end-stage renal disease -She is a dialysis patient, Tuesday Thursday Saturday schedule -Nephrology consulted for dialysis given volume overload state -Will resume her normal HD schedule  5.  Elevated troponin. -Lab showing stable troponin of 0.21 and 0.24. She denies chest pain, EKG did not reveal acute ischemic changes, as I suspect mild troponin elevation related to demand ischemia/CHF   6.  Insulin dependent diabetes mellitus -Continue Lantus 16 units subcutaneous at bedtime along with mealtime coverage with Novolog 6 units  7. Deconditioning -PT recommending HH services, which where set up prior to discharge   Procedures:  HD  Consultations:  Nephrology  Discharge Exam: Vitals:   12/15/15 2128 12/16/15 0525  BP: (!) 175/56 (!) 147/53  Pulse: 86 85  Resp: 16 16  Temp: 98.8 F (37.1 C) 98.2 F (36.8 C)    General exam: Chronically ill-appearing, appears weak and deconditioned Respiratory system: Interim improvement to her respiratory status Cardiovascular system: S1 & S2 heard, RRR. No JVD, murmurs, rubs, gallops or clicks, 1+ lower extremity edema Gastrointestinal system: Abdomen is nondistended, soft and nontender. No organomegaly or masses felt. Normal bowel sounds heard. Central nervous system: Alert and oriented. No focal neurological deficits. Extremities: Symmetric 5 x 5 power. Skin: No rashes, lesions or ulcers Psychiatry: Judgement and insight appear normal. Mood & affect appropriate.   Discharge Instructions   Discharge Instructions    Call MD for:    Complete by:  As directed    Call MD for:  difficulty breathing, headache or visual disturbances     Complete by:  As directed    Call MD for:  extreme fatigue    Complete by:  As directed    Call MD for:  hives    Complete by:  As directed    Call MD for:  persistant dizziness or light-headedness    Complete by:  As directed    Call MD for:  persistant nausea and vomiting    Complete by:  As directed    Call MD for:  redness, tenderness, or signs of infection (pain, swelling, redness, odor or green/yellow discharge around incision site)    Complete by:  As directed    Call MD for:  severe uncontrolled pain    Complete by:  As directed    Call MD for:  temperature >100.4    Complete by:  As directed    Diet - low sodium heart healthy    Complete by:  As directed    Increase activity slowly    Complete by:  As directed      Current Discharge Medication List    START taking these medications   Details  insulin aspart (NOVOLOG) 100 UNIT/ML injection Inject 6 Units into the skin 3 (three) times daily with meals. Qty: 10 mL, Refills: 11      CONTINUE these medications which have NOT CHANGED   Details  acetaminophen (TYLENOL) 500 MG tablet Take 500 mg by mouth every 6 (six) hours as needed for pain.    albuterol (PROVENTIL HFA;VENTOLIN HFA) 108 (90 Base) MCG/ACT inhaler Inhale 2 puffs into the lungs every 6 (six) hours as needed for wheezing or shortness of breath. Qty: 1  Inhaler, Refills: 2    albuterol (PROVENTIL) (2.5 MG/3ML) 0.083% nebulizer solution USE 1 VIAL VIA NEBULIZER 3 TIMES A DAY AS NEEDED FOR WHEEZING OR SHORTNESS OF BREATH Refills: 5    calcium acetate (PHOSLO) 667 MG capsule Take 2,001-2,668 mg by mouth See admin instructions. Take 2668 mg by mouth 3 times daily with meals and take 2001 mg by mouth with snacks    Cholecalciferol (VITAMIN D PO) Take 1 capsule by mouth daily.    cloNIDine (CATAPRES - DOSED IN MG/24 HR) 0.2 mg/24hr patch Place 0.2 mg onto the skin every Thursday.  Refills: 5    fluticasone furoate-vilanterol (BREO ELLIPTA) 100-25 MCG/INH AEPB  Inhale 1 puff into the lungs daily.    insulin glargine (LANTUS) 100 UNIT/ML injection Inject 16 Units into the skin at bedtime.    lidocaine-prilocaine (EMLA) cream Apply 1 application topically every other day.    lisinopril (PRINIVIL,ZESTRIL) 40 MG tablet Take 40 mg by mouth every evening. Refills: 5    NIFEdipine (PROCARDIA XL/ADALAT-CC) 60 MG 24 hr tablet Take 60 mg by mouth 2 (two) times daily. Refills: 5    pravastatin (PRAVACHOL) 40 MG tablet Take 40 mg by mouth daily.    tiotropium (SPIRIVA HANDIHALER) 18 MCG inhalation capsule Place 1 capsule into inhaler and inhale daily.    traMADol (ULTRAM) 50 MG tablet Take 50 mg by mouth every 8 (eight) hours as needed for pain.       STOP taking these medications     NOVOLOG MIX 70/30 FLEXPEN (70-30) 100 UNIT/ML FlexPen      diclofenac sodium (VOLTAREN) 1 % GEL      gabapentin (NEURONTIN) 100 MG capsule      oxyCODONE-acetaminophen (PERCOCET/ROXICET) 5-325 MG per tablet        No Known Allergies Follow-up Information    Joana Reamer, MD Follow up in 1 week(s).   Specialty:  Obstetrics and Gynecology Contact information: [address] [place] [place] [address] [phone]            The results of significant diagnostics from this hospitalization (including imaging, microbiology, ancillary and laboratory) are listed below for reference.    Significant Diagnostic Studies: Dg Chest Port 1 View  Result Date: 12/15/2015 CLINICAL DATA:  82 year old female with history of shortness of breath. EXAM: PORTABLE CHEST 1 VIEW COMPARISON:  Chest x-ray 12/12/2015. FINDINGS: There are mass-like areas of consolidation throughout the right mid to lower lung, which have progressively increased on numerous prior examinations dating back to 08/11/2012. The appearance is unusual, and the high density the area suggests some degree of calcification in the underlying process. This may simply reflect sequela of recurrent  aspiration with extensive partially calcified pleural parenchymal scarring from recurrent infection/inflammation, however, underlying neoplastic process is difficult to entirely exclude. More ill-defined airspace disease is also noted throughout this region, likely to reflect an acute process. Small bilateral pleural effusions (right greater than left). Mild cephalization of the pulmonary vasculature. Diffusely increased interstitial markings. Mild cardiomegaly. The patient is rotated to the right on today's exam, resulting in distortion of the mediastinal contours and reduced diagnostic sensitivity and specificity for mediastinal pathology. Atherosclerosis in the thoracic aorta. IMPRESSION: 1. The appearance of the chest suggests mild congestive heart failure, as above. 2. In addition, there is more focal airspace consolidation throughout the right mid to lower lung, some of which appears likely acute while much of this appears to be chronic and partially calcified. This appearance is unusual, but can  be seen, particularly in the setting of underlying renal disease. This area could be better evaluated with followup nonemergent chest CT, preferably with IV contrast if possible. Underlying malignancy is not favored, but is difficult to exclude, as no prior CT imaging of the thorax has been obtained. Electronically Signed   By: Trudie Reedaniel  Entrikin M.D.   On: 12/15/2015 08:07   Dg Chest Portable 1 View  Result Date: 12/12/2015 CLINICAL DATA:  82 year old female with shortness of breath EXAM: PORTABLE CHEST 1 VIEW COMPARISON:  Chest radiograph dated 08/14/2015 FINDINGS: Single portable view of the chest demonstrate stable cardiomegaly with congestive changes, progressed from prior study. Chronic right lung base densities noted, however there is overall increased airspace density involving the mid to lower lung fields concerning for superimposed acute infection. Clinical correlation is recommended. Small bilateral  pleural effusions. No pneumothorax. There is degenerative changes of the spine and shoulders. No acute fracture. IMPRESSION: Cardiomegaly with worsening of the congestive changes and interval development of small bilateral pleural effusions. Progression of mid to lower lung field airspace densities concerning for superimposed pneumonia. Clinical correlation is recommended. Electronically Signed   By: Elgie CollardArash  Radparvar M.D.   On: 12/12/2015 03:48    Microbiology: Recent Results (from the past 240 hour(s))  MRSA PCR Screening     Status: None   Collection Time: 12/12/15  7:25 AM  Result Value Ref Range Status   MRSA by PCR NEGATIVE NEGATIVE Final    Comment:        The GeneXpert MRSA Assay (FDA approved for NASAL specimens only), is one component of a comprehensive MRSA colonization surveillance program. It is not intended to diagnose MRSA infection nor to guide or monitor treatment for MRSA infections.      Labs: Basic Metabolic Panel:  Recent Labs Lab 12/12/15 0310 12/13/15 0242 12/14/15 0738  NA 141 137 132*  K 4.6 4.8 5.2*  CL 102 101 95*  CO2 27 28 22   GLUCOSE 86 96 65  BUN 29* 21* 46*  CREATININE 7.96* 6.27* 8.13*  CALCIUM 9.1 8.5* 8.2*  PHOS  --   --  3.7   Liver Function Tests:  Recent Labs Lab 12/14/15 0738  ALBUMIN 2.8*   No results for input(s): LIPASE, AMYLASE in the last 168 hours. No results for input(s): AMMONIA in the last 168 hours. CBC:  Recent Labs Lab 12/12/15 0310 12/13/15 0242 12/14/15 0737  WBC 8.5 6.6 7.9  HGB 9.9* 9.8* 9.6*  HCT 32.9* 33.3* 30.8*  MCV 98.5 98.8 95.4  PLT 232 200 207   Cardiac Enzymes:  Recent Labs Lab 12/12/15 0652 12/12/15 1353 12/12/15 1904  TROPONINI 0.24* 0.23* 0.22*   BNP: BNP (last 3 results)  Recent Labs  12/12/15 0311  BNP 569.8*    ProBNP (last 3 results) No results for input(s): PROBNP in the last 8760 hours.  CBG:  Recent Labs Lab 12/15/15 0754 12/15/15 1141 12/15/15 1630  12/15/15 2126 12/16/15 0736  GLUCAP 168* 157* 243* 240* 191*       Signed:  Jeralyn BennettZAMORA, Laya Letendre MD.  [facility] 12/16/2015, 8:11 AM

## 2016-10-15 ENCOUNTER — Telehealth: Payer: Self-pay | Admitting: Physical Therapy

## 2016-10-15 NOTE — Telephone Encounter (Signed)
10/15/16 spoke with daughter, pt does not want to do PT

## 2017-06-07 ENCOUNTER — Encounter (HOSPITAL_COMMUNITY): Payer: Self-pay | Admitting: Emergency Medicine

## 2017-06-07 ENCOUNTER — Emergency Department (HOSPITAL_COMMUNITY): Payer: Medicare HMO

## 2017-06-07 ENCOUNTER — Inpatient Hospital Stay (HOSPITAL_COMMUNITY)
Admission: EM | Admit: 2017-06-07 | Discharge: 2017-06-10 | DRG: 189 | Disposition: A | Discharge status: Home Health | Payer: Medicare HMO | Attending: Internal Medicine | Admitting: Internal Medicine

## 2017-06-07 ENCOUNTER — Other Ambulatory Visit: Payer: Self-pay

## 2017-06-07 DIAGNOSIS — Z90710 Acquired absence of both cervix and uterus: Secondary | ICD-10-CM | POA: Diagnosis not present

## 2017-06-07 DIAGNOSIS — I2510 Atherosclerotic heart disease of native coronary artery without angina pectoris: Secondary | ICD-10-CM | POA: Diagnosis present

## 2017-06-07 DIAGNOSIS — E1129 Type 2 diabetes mellitus with other diabetic kidney complication: Secondary | ICD-10-CM

## 2017-06-07 DIAGNOSIS — Z8249 Family history of ischemic heart disease and other diseases of the circulatory system: Secondary | ICD-10-CM | POA: Diagnosis not present

## 2017-06-07 DIAGNOSIS — L89300 Pressure ulcer of unspecified buttock, unstageable: Secondary | ICD-10-CM | POA: Diagnosis present

## 2017-06-07 DIAGNOSIS — E11649 Type 2 diabetes mellitus with hypoglycemia without coma: Secondary | ICD-10-CM | POA: Diagnosis present

## 2017-06-07 DIAGNOSIS — J449 Chronic obstructive pulmonary disease, unspecified: Secondary | ICD-10-CM | POA: Diagnosis present

## 2017-06-07 DIAGNOSIS — I471 Supraventricular tachycardia: Secondary | ICD-10-CM | POA: Diagnosis present

## 2017-06-07 DIAGNOSIS — D649 Anemia, unspecified: Secondary | ICD-10-CM | POA: Diagnosis not present

## 2017-06-07 DIAGNOSIS — Z79899 Other long term (current) drug therapy: Secondary | ICD-10-CM

## 2017-06-07 DIAGNOSIS — I10 Essential (primary) hypertension: Secondary | ICD-10-CM | POA: Diagnosis present

## 2017-06-07 DIAGNOSIS — R0602 Shortness of breath: Secondary | ICD-10-CM | POA: Diagnosis present

## 2017-06-07 DIAGNOSIS — I160 Hypertensive urgency: Secondary | ICD-10-CM | POA: Diagnosis not present

## 2017-06-07 DIAGNOSIS — E1122 Type 2 diabetes mellitus with diabetic chronic kidney disease: Secondary | ICD-10-CM | POA: Diagnosis present

## 2017-06-07 DIAGNOSIS — Z6821 Body mass index (BMI) 21.0-21.9, adult: Secondary | ICD-10-CM | POA: Diagnosis not present

## 2017-06-07 DIAGNOSIS — L8990 Pressure ulcer of unspecified site, unspecified stage: Secondary | ICD-10-CM

## 2017-06-07 DIAGNOSIS — E8889 Other specified metabolic disorders: Secondary | ICD-10-CM | POA: Diagnosis present

## 2017-06-07 DIAGNOSIS — Z833 Family history of diabetes mellitus: Secondary | ICD-10-CM

## 2017-06-07 DIAGNOSIS — J810 Acute pulmonary edema: Secondary | ICD-10-CM | POA: Diagnosis present

## 2017-06-07 DIAGNOSIS — I340 Nonrheumatic mitral (valve) insufficiency: Secondary | ICD-10-CM | POA: Diagnosis not present

## 2017-06-07 DIAGNOSIS — Z794 Long term (current) use of insulin: Secondary | ICD-10-CM

## 2017-06-07 DIAGNOSIS — D631 Anemia in chronic kidney disease: Secondary | ICD-10-CM | POA: Diagnosis present

## 2017-06-07 DIAGNOSIS — Z823 Family history of stroke: Secondary | ICD-10-CM

## 2017-06-07 DIAGNOSIS — Z841 Family history of disorders of kidney and ureter: Secondary | ICD-10-CM | POA: Diagnosis not present

## 2017-06-07 DIAGNOSIS — N186 End stage renal disease: Secondary | ICD-10-CM | POA: Diagnosis present

## 2017-06-07 DIAGNOSIS — E118 Type 2 diabetes mellitus with unspecified complications: Secondary | ICD-10-CM | POA: Diagnosis not present

## 2017-06-07 DIAGNOSIS — I120 Hypertensive chronic kidney disease with stage 5 chronic kidney disease or end stage renal disease: Secondary | ICD-10-CM | POA: Diagnosis present

## 2017-06-07 DIAGNOSIS — Z87891 Personal history of nicotine dependence: Secondary | ICD-10-CM | POA: Diagnosis not present

## 2017-06-07 DIAGNOSIS — Z8673 Personal history of transient ischemic attack (TIA), and cerebral infarction without residual deficits: Secondary | ICD-10-CM

## 2017-06-07 DIAGNOSIS — E440 Moderate protein-calorie malnutrition: Secondary | ICD-10-CM

## 2017-06-07 DIAGNOSIS — Z992 Dependence on renal dialysis: Secondary | ICD-10-CM | POA: Diagnosis not present

## 2017-06-07 DIAGNOSIS — J9601 Acute respiratory failure with hypoxia: Secondary | ICD-10-CM | POA: Diagnosis present

## 2017-06-07 LAB — COMPREHENSIVE METABOLIC PANEL
ALT: 22 U/L (ref 14–54)
AST: 36 U/L (ref 15–41)
Albumin: 3.3 g/dL — ABNORMAL LOW (ref 3.5–5.0)
Alkaline Phosphatase: 113 U/L (ref 38–126)
Anion gap: 14 (ref 5–15)
BUN: 27 mg/dL — AB (ref 6–20)
CHLORIDE: 101 mmol/L (ref 101–111)
CO2: 26 mmol/L (ref 22–32)
CREATININE: 6.04 mg/dL — AB (ref 0.44–1.00)
Calcium: 8.6 mg/dL — ABNORMAL LOW (ref 8.9–10.3)
GFR calc Af Amer: 7 mL/min — ABNORMAL LOW (ref >60)
GFR, EST NON AFRICAN AMERICAN: 6 mL/min — AB (ref >60)
GLUCOSE: 103 mg/dL — AB (ref 65–99)
Potassium: 3.6 mmol/L (ref 3.5–5.1)
Sodium: 141 mmol/L (ref 135–145)
Total Bilirubin: 0.6 mg/dL (ref 0.3–1.2)
Total Protein: 7.6 g/dL (ref 6.5–8.1)

## 2017-06-07 LAB — CBC WITH DIFFERENTIAL/PLATELET
Basophils Absolute: 0 10*3/uL (ref 0.0–0.1)
Basophils Relative: 0 %
Eosinophils Absolute: 0.2 10*3/uL (ref 0.0–0.7)
Eosinophils Relative: 2 %
HEMATOCRIT: 38 % (ref 36.0–46.0)
Hemoglobin: 11.7 g/dL — ABNORMAL LOW (ref 12.0–15.0)
LYMPHS PCT: 10 %
Lymphs Abs: 0.9 10*3/uL (ref 0.7–4.0)
MCH: 28.3 pg (ref 26.0–34.0)
MCHC: 30.8 g/dL (ref 30.0–36.0)
MCV: 91.8 fL (ref 78.0–100.0)
MONO ABS: 0.5 10*3/uL (ref 0.1–1.0)
MONOS PCT: 5 %
NEUTROS ABS: 8.1 10*3/uL — AB (ref 1.7–7.7)
Neutrophils Relative %: 83 %
Platelets: 206 10*3/uL (ref 150–400)
RBC: 4.14 MIL/uL (ref 3.87–5.11)
RDW: 19 % — AB (ref 11.5–15.5)
WBC: 9.8 10*3/uL (ref 4.0–10.5)

## 2017-06-07 LAB — GLUCOSE, CAPILLARY
GLUCOSE-CAPILLARY: 86 mg/dL (ref 65–99)
Glucose-Capillary: 64 mg/dL — ABNORMAL LOW (ref 65–99)

## 2017-06-07 LAB — TROPONIN I: Troponin I: 0.05 ng/mL (ref <0.03)

## 2017-06-07 LAB — BRAIN NATRIURETIC PEPTIDE: B NATRIURETIC PEPTIDE 5: 1685.3 pg/mL — AB (ref 0.0–100.0)

## 2017-06-07 MED ORDER — SODIUM CHLORIDE 0.9% FLUSH
3.0000 mL | Freq: Two times a day (BID) | INTRAVENOUS | Status: DC
  Administered 2017-06-08 – 2017-06-09 (×5): 3 mL via INTRAVENOUS

## 2017-06-07 MED ORDER — SODIUM CHLORIDE 0.9 % IV SOLN: 100.0000 mL | INTRAVENOUS | Status: DC | PRN

## 2017-06-07 MED ORDER — LIDOCAINE HCL (PF) 1 % IJ SOLN: 5.0000 mL | INTRAMUSCULAR | Status: DC | PRN

## 2017-06-07 MED ORDER — HEPARIN SODIUM (PORCINE) 1000 UNIT/ML DIALYSIS
1000.0000 [IU] | INTRAMUSCULAR | Status: DC | PRN
Start: 2017-06-07 — End: 2017-06-10
  Filled 2017-06-07: qty 1

## 2017-06-07 MED ORDER — PRAVASTATIN SODIUM 40 MG PO TABS
40.0000 mg | ORAL_TABLET | Freq: Every day | ORAL | Status: DC
  Administered 2017-06-08 – 2017-06-09 (×3): 40 mg via ORAL
  Filled 2017-06-07 (×3): qty 1

## 2017-06-07 MED ORDER — CALCIUM ACETATE 667 MG PO CAPS: 2001.0000 mg | ORAL_CAPSULE | ORAL | Status: DC

## 2017-06-07 MED ORDER — ACETAMINOPHEN 325 MG PO TABS
650.0000 mg | ORAL_TABLET | Freq: Once | ORAL | Status: AC
  Administered 2017-06-07: 650 mg via ORAL

## 2017-06-07 MED ORDER — CALCIUM ACETATE (PHOS BINDER) 667 MG PO CAPS: 2668.0000 mg | ORAL_CAPSULE | Freq: Three times a day (TID) | ORAL | Status: DC

## 2017-06-07 MED ORDER — HEPARIN SODIUM (PORCINE) 5000 UNIT/ML IJ SOLN
5000.0000 [IU] | Freq: Three times a day (TID) | INTRAMUSCULAR | Status: DC
  Administered 2017-06-08 – 2017-06-10 (×8): 5000 [IU] via SUBCUTANEOUS
  Filled 2017-06-07 (×8): qty 1

## 2017-06-07 MED ORDER — INSULIN ASPART 100 UNIT/ML ~~LOC~~ SOLN
0.0000 [IU] | Freq: Three times a day (TID) | SUBCUTANEOUS | Status: DC
Start: 2017-06-08 — End: 2017-06-10
  Administered 2017-06-09: 1 [IU] via SUBCUTANEOUS
  Administered 2017-06-09: 2 [IU] via SUBCUTANEOUS

## 2017-06-07 MED ORDER — VITAMIN D3 25 MCG (1000 UNIT) PO TABS
2000.0000 [IU] | ORAL_TABLET | ORAL | Status: DC
  Administered 2017-06-08: 2000 [IU] via ORAL
  Filled 2017-06-07: qty 2

## 2017-06-07 MED ORDER — ALBUTEROL SULFATE (2.5 MG/3ML) 0.083% IN NEBU: 2.5000 mg | INHALATION_SOLUTION | Freq: Four times a day (QID) | RESPIRATORY_TRACT | Status: DC | PRN

## 2017-06-07 MED ORDER — TIOTROPIUM BROMIDE MONOHYDRATE 18 MCG IN CAPS
1.0000 | ORAL_CAPSULE | Freq: Every day | RESPIRATORY_TRACT | Status: DC
  Administered 2017-06-09 – 2017-06-10 (×2): 18 ug via RESPIRATORY_TRACT
  Filled 2017-06-07: qty 5

## 2017-06-07 MED ORDER — INSULIN GLARGINE 100 UNIT/ML ~~LOC~~ SOLN
16.0000 [IU] | Freq: Every day | SUBCUTANEOUS | Status: DC
  Administered 2017-06-08: 16 [IU] via SUBCUTANEOUS
  Filled 2017-06-07: qty 0.16

## 2017-06-07 MED ORDER — INSULIN ASPART 100 UNIT/ML ~~LOC~~ SOLN: 0.0000 [IU] | Freq: Every day | SUBCUTANEOUS | Status: DC

## 2017-06-07 MED ORDER — FLUTICASONE FUROATE-VILANTEROL 100-25 MCG/INH IN AEPB
1.0000 | INHALATION_SPRAY | Freq: Every day | RESPIRATORY_TRACT | Status: DC
  Administered 2017-06-09 – 2017-06-10 (×2): 1 via RESPIRATORY_TRACT
  Filled 2017-06-07: qty 28

## 2017-06-07 MED ORDER — LISINOPRIL 40 MG PO TABS
40.0000 mg | ORAL_TABLET | Freq: Every evening | ORAL | Status: DC
  Administered 2017-06-08 – 2017-06-09 (×3): 40 mg via ORAL
  Filled 2017-06-07 (×3): qty 1

## 2017-06-07 MED ORDER — ACETAMINOPHEN 500 MG PO TABS
500.0000 mg | ORAL_TABLET | Freq: Four times a day (QID) | ORAL | Status: DC | PRN
  Administered 2017-06-09 – 2017-06-10 (×2): 500 mg via ORAL
  Filled 2017-06-07 (×2): qty 1

## 2017-06-07 MED ORDER — ALTEPLASE 2 MG IJ SOLR: 2.0000 mg | Freq: Once | INTRAMUSCULAR | Status: DC | PRN

## 2017-06-07 MED ORDER — LIDOCAINE-PRILOCAINE 2.5-2.5 % EX CREA: 1.0000 "application " | TOPICAL_CREAM | CUTANEOUS | Status: DC | PRN

## 2017-06-07 MED ORDER — NIFEDIPINE ER OSMOTIC RELEASE 30 MG PO TB24
60.0000 mg | ORAL_TABLET | Freq: Two times a day (BID) | ORAL | Status: DC
  Administered 2017-06-08 – 2017-06-10 (×6): 60 mg via ORAL
  Filled 2017-06-07 (×6): qty 2

## 2017-06-07 MED ORDER — ACETAMINOPHEN 325 MG PO TABS
ORAL_TABLET | ORAL | Status: AC
  Filled 2017-06-07: qty 2

## 2017-06-07 MED ORDER — CALCIUM ACETATE (PHOS BINDER) 667 MG PO CAPS: 2001.0000 mg | ORAL_CAPSULE | ORAL | Status: DC

## 2017-06-07 MED ORDER — PENTAFLUOROPROP-TETRAFLUOROETH EX AERO: 1.0000 "application " | INHALATION_SPRAY | CUTANEOUS | Status: DC | PRN

## 2017-06-07 MED ORDER — TRAMADOL HCL 50 MG PO TABS
50.0000 mg | ORAL_TABLET | Freq: Two times a day (BID) | ORAL | Status: DC | PRN
  Administered 2017-06-09: 50 mg via ORAL
  Filled 2017-06-07: qty 1

## 2017-06-07 NOTE — ED Notes (Signed)
Dr. Signe ColtUpton aware of abnormal VS.

## 2017-06-07 NOTE — Consult Note (Signed)
Reason for Consult: To manage dialysis and dialysis related needs Referring Physician: Dr. Regenia Skeeter, ED  Nicole Cox is an 84 y.o. female.  HPI: Pt is an 27F with ESRD on HD TTS at [facility], CAD, DM, HTN, COPD, CHF who is now seen in consultation at the request of Dr. Regenia Skeeter for eval and recs re: volume overload and provision of HD.    Pt dialyzes TTS at [facility].  Has attended all sessions with last session being 3/23. Had acute onset of dyspnea this AM and was brought to ED.  En route placed on CPAP   No f/c.  Some cough.  Reports she doesn't sign off early.    Has a HH nurse and was told this week that there was a possible pneumonia based on physical exam.     HD records pending.  Past Medical History:  Diagnosis Date  . CHF (congestive heart failure) ([place])   . COPD (chronic obstructive pulmonary disease) ([place])   . Coronary artery disease   . Diabetes mellitus without complication ([place])   . Heart disease   . Hypertension   . Renal disorder   . Stroke [facility])     Past Surgical History:  Procedure Laterality Date  . ABDOMINAL HYSTERECTOMY    . CHOLECYSTECTOMY    . TONSILLECTOMY      Family History  Problem Relation Age of Onset  . Heart disease Mother   . Hypertension Mother   . Heart disease Father   . Hypertension Father   . Heart disease Sister   . Diabetes Sister   . Stroke Sister   . Kidney disease Sister   . Hypertension Sister   . Heart disease Sister   . Kidney disease Sister   . Hypertension Sister   . Kidney disease Daughter   . Hypertension Daughter     Social History:  reports that she has quit smoking. She has never used smokeless tobacco. She reports that she does not drink alcohol or use drugs.  Allergies: No Known Allergies  Medications: I have reviewed the patient's current medications.   Results for orders placed or performed during the hospital encounter of 06/07/17 (from the past 48 hour(s))  Comprehensive metabolic panel     Status:  Abnormal   Collection Time: 06/07/17 10:00 AM  Result Value Ref Range   Sodium 141 135 - 145 mmol/L   Potassium 3.6 3.5 - 5.1 mmol/L   Chloride 101 101 - 111 mmol/L   CO2 26 22 - 32 mmol/L   Glucose, Bld 103 (H) 65 - 99 mg/dL   BUN 27 (H) 6 - 20 mg/dL   Creatinine, Ser 6.04 (H) 0.44 - 1.00 mg/dL   Calcium 8.6 (L) 8.9 - 10.3 mg/dL   Total Protein 7.6 6.5 - 8.1 g/dL   Albumin 3.3 (L) 3.5 - 5.0 g/dL   AST 36 15 - 41 U/L   ALT 22 14 - 54 U/L   Alkaline Phosphatase 113 38 - 126 U/L   Total Bilirubin 0.6 0.3 - 1.2 mg/dL   GFR calc non Af Amer 6 (L) >60 mL/min   GFR calc Af Amer 7 (L) >60 mL/min    Comment: (NOTE) The eGFR has been calculated using the CKD EPI equation. This calculation has not been validated in all clinical situations. eGFR's persistently <60 mL/min signify possible Chronic Kidney Disease.    Anion gap 14 5 - 15    Comment: Performed at [facility], [address]  [place], [place] [address]  Brain natriuretic peptide     Status: Abnormal   Collection Time: 06/07/17 10:00 AM  Result Value Ref Range   B Natriuretic Peptide 1,685.3 (H) 0.0 - 100.0 pg/mL    Comment: Performed at [facility], [place] [address], [place], [place] [address]  Troponin I     Status: Abnormal   Collection Time: 06/07/17 10:00 AM  Result Value Ref Range   Troponin I 0.05 (HH) <0.03 ng/mL    Comment: CRITICAL RESULT CALLED TO, READ BACK BY AND VERIFIED WITH: ROSSER,M.RN AT 1043 06/07/17 MULLINS,T Performed at [facility], [place] [address] [place], [place] [address]   CBC with Differential     Status: Abnormal   Collection Time: 06/07/17 10:00 AM  Result Value Ref Range   WBC 9.8 4.0 - 10.5 K/uL   RBC 4.14 3.87 - 5.11 MIL/uL   Hemoglobin 11.7 (L) 12.0 - 15.0 g/dL   HCT 38.0 36.0 - 46.0 %   MCV 91.8 78.0 - 100.0 fL   MCH 28.3 26.0 - 34.0 pg   MCHC 30.8 30.0 - 36.0 g/dL   RDW 19.0 (H) 11.5 - 15.5 %   Platelets 206 150 - 400 K/uL    Neutrophils Relative % 83 %   Neutro Abs 8.1 (H) 1.7 - 7.7 K/uL   Lymphocytes Relative 10 %   Lymphs Abs 0.9 0.7 - 4.0 K/uL   Monocytes Relative 5 %   Monocytes Absolute 0.5 0.1 - 1.0 K/uL   Eosinophils Relative 2 %   Eosinophils Absolute 0.2 0.0 - 0.7 K/uL   Basophils Relative 0 %   Basophils Absolute 0.0 0.0 - 0.1 K/uL    Comment: Performed at [facility], [place] [address] [place], [place] [address]    Dg Chest 2 View  Result Date: 06/07/2017 CLINICAL DATA:  Cough and shortness of Breath EXAM: CHEST - 2 VIEW COMPARISON:  10/30/2016 FINDINGS: Cardiac shadow is stable. Aortic calcifications are again seen. Lungs are well aerated bilaterally. Chronic pleural calcifications are noted on the right inferiorly. Diffuse central vascular congestion and interstitial edema is noted. No focal confluent infiltrate is seen. IMPRESSION: Vascular congestion interstitial edema superimposed on chronic changes in the right base. Electronically Signed   By: Inez Catalina M.D.   On: 06/07/2017 09:30    ROS: all other systems reviewed and are negative except per HPI Blood pressure (!) 205/72, pulse (!) 101, temperature 97.8 F (36.6 C), temperature source Oral, resp. rate (!) 23, SpO2 92 %. .  GEN sitting in bed, [place] O2 in place HEENT EOMI, PERRL, anicteric NECK JVD to angle with mandible PULM tachypneic, increased WOB, crackles 2/3 way up lung fields CV tachycardic, soft systolic murmur ABD soft, nontender EXT trace RLE, no LLE NEURO AAO x 3 SKIN no rashes ACCESS: LUE AVF + T/B, some thinned areas  Assessment/Plan: 1 Acute hypoxic RF: appears to be pulm edema as evidenced on CXR.  Don't suspect pneumonia at this point- no infectious symptoms.  Have d/w ED physician- to be transferred to [facility] for emergent HD today and back to TTS schedule tomorrow.  Will likely need serial dialysis and a lowering of EDW. 2 ESRD:TTS at [facility]- records pending 3 Hypertension: expect to improve with  UF. 4. Anemia of ESRD: Will get records and dose iron and ESA as appropriate 5. Metabolic Bone Disease: VDRA and binders as appropriate   Jaqlyn Gruenhagen 06/07/2017, 12:42 PM

## 2017-06-07 NOTE — ED Provider Notes (Signed)
[facility]-EMERGENCY DEPT Provider Note   CSN: [id number] Arrival date & time: 06/07/17  0850     History   Chief Complaint Chief Complaint  Patient presents with  . Shortness of Breath    HPI Nicole Cox is a 84 y.o. female.  HPI  84 year old female with a history of end-stage renal disease on dialysis and COPD presents with acute shortness of breath.  She states it started this morning around 5 AM.  She was already awake when it started.  She has had a mild nonproductive cough and some wheezing when it started.  She has tried 2 albuterol treatments at home and EMS placed her on CPAP due to hearing Rales and her being hypertensive (~170 systolic).  EMS states that she started to feel better after the CPAP.  Patient denies any fevers, chest pain, or leg swelling.  Her last dialysis was on 3/23 as scheduled.  She states this feels like prior COPD.  Denies any pain.  Past Medical History:  Diagnosis Date  . CHF (congestive heart failure) (HCC)   . COPD (chronic obstructive pulmonary disease) (HCC)   . Coronary artery disease   . Diabetes mellitus without complication (HCC)   . Heart disease   . Hypertension   . Renal disorder   . Stroke [facility])     Patient Active Problem List   Diagnosis Date Noted  . Acute pulmonary edema (HCC) 06/07/2017  . Respiratory failure with hypoxia (HCC) 12/12/2015  . Acute on chronic respiratory failure with hypoxia (HCC) 12/12/2015  . Fluid overload 12/12/2015  . Palliative care encounter   . Acute respiratory failure with hypoxia (HCC) 08/13/2015  . ESRD on hemodialysis (HCC) 08/13/2015  . Hypertensive urgency 08/13/2015  . Chronic anemia 08/13/2015  . Acute respiratory failure (HCC) 08/13/2015  . HCAP (healthcare-associated pneumonia) 08/11/2012  . Hypoxia 08/11/2012  . ESRD (end stage renal disease) (HCC) 08/11/2012  . Hypertension 08/11/2012  . Diabetes mellitus (HCC) 08/11/2012  . C1 cervical fracture (HCC)  08/11/2012  . Abnormal MRI, thoracic spine 08/11/2012  . COPD (chronic obstructive pulmonary disease) (HCC) 08/11/2012  . CAD (coronary artery disease) 08/11/2012  . Fall 08/11/2012    Past Surgical History:  Procedure Laterality Date  . ABDOMINAL HYSTERECTOMY    . CHOLECYSTECTOMY    . TONSILLECTOMY       OB History   None      Home Medications    Prior to Admission medications   Medication Sig Start Date End Date Taking? Authorizing Provider  acetaminophen (TYLENOL) 500 MG tablet Take 500 mg by mouth every 6 (six) hours as needed for pain.    [provider]  albuterol (PROVENTIL HFA;VENTOLIN HFA) 108 (90 Base) MCG/ACT inhaler Inhale 2 puffs into the lungs every 6 (six) hours as needed for wheezing or shortness of breath. 08/17/15   Mikhail, Nita Sells, DO  albuterol (PROVENTIL) (2.5 MG/3ML) 0.083% nebulizer solution USE 1 VIAL VIA NEBULIZER 3 TIMES A DAY AS NEEDED FOR WHEEZING OR SHORTNESS OF BREATH 12/03/15   [provider]  calcium acetate (PHOSLO) 667 MG capsule Take 2,001-2,668 mg by mouth See admin instructions. Take 2668 mg by mouth 3 times daily with meals and take 2001 mg by mouth with snacks    [provider]  Cholecalciferol (VITAMIN D3) 1000 units CAPS Take 2,000 Units by mouth every other day.    [provider]  cloNIDine (CATAPRES - DOSED IN MG/24 HR) 0.2 mg/24hr patch Place 0.2 mg onto the  skin every Thursday.  12/03/15   [provider]  fluticasone furoate-vilanterol (BREO ELLIPTA) 100-25 MCG/INH AEPB Inhale 1 puff into the lungs daily. 11/10/15   [provider]  insulin aspart (NOVOLOG) 100 UNIT/ML injection Inject 6 Units into the skin 3 (three) times daily with meals. 12/16/15   Jeralyn Bennett, MD  insulin glargine (LANTUS) 100 UNIT/ML injection Inject 16 Units into the skin at bedtime.    [provider]  lidocaine-prilocaine (EMLA) cream Apply 1 application topically every other day. 10/21/15   [provider]  lisinopril (PRINIVIL,ZESTRIL) 40 MG tablet Take 40 mg by mouth every evening. 12/02/15   [provider]  NIFEdipine (PROCARDIA XL/ADALAT-CC) 60 MG 24 hr tablet Take 60 mg by mouth 2 (two) times daily. 12/03/15   [provider]  pravastatin (PRAVACHOL) 40 MG tablet Take 40 mg by mouth daily.    [provider]  predniSONE (STERAPRED UNI-PAK 21 TAB) 10 MG (21) TBPK tablet Take 6-5-4-3-2-1 tablets by mouth daily till gone. 12/16/15   Jeralyn Bennett, MD  tiotropium (SPIRIVA HANDIHALER) 18 MCG inhalation capsule Place 1 capsule into inhaler and inhale daily. 08/10/14   [provider]  traMADol (ULTRAM) 50 MG tablet Take 50 mg by mouth every 8 (eight) hours as needed for pain.     [provider]    Family History Family History  Problem Relation Age of Onset  . Heart disease Mother   . Hypertension Mother   . Heart disease Father   . Hypertension Father   . Heart disease Sister   . Diabetes Sister   . Stroke Sister   . Kidney disease Sister   . Hypertension Sister   . Heart disease Sister   . Kidney disease Sister   . Hypertension Sister   . Kidney disease Daughter   . Hypertension Daughter     Social History Social History   Tobacco Use  . Smoking status: Former Games developer  . Smokeless tobacco: Never Used  Substance Use Topics  . Alcohol use: No  . Drug use: No     Allergies   Patient has no known allergies.   Review of Systems Review of Systems  Constitutional: Negative for fever.  Respiratory: Positive for cough, shortness of breath and wheezing.   Cardiovascular: Negative for chest pain and leg swelling.  Gastrointestinal: Negative for abdominal pain and vomiting.  All other systems reviewed and are negative.    Physical Exam Updated Vital Signs BP (!) 200/81   Pulse 82   Temp 98.2 F (36.8 C) (Oral)   Resp (!) 22   Wt 64.4 kg (141 lb 15.6 oz)   SpO2 95%   BMI 25.97 kg/m   Physical Exam    Constitutional: She is oriented to person, place, and time. She appears well-developed and well-nourished.  Non-toxic appearance. She does not appear ill. No distress.  Currently on CPAP, asks for it to be taken off  HENT:  Head: Normocephalic and atraumatic.  Right Ear: External ear normal.  Left Ear: External ear normal.  Nose: Nose normal.  Eyes: Right eye exhibits no discharge. Left eye exhibits no discharge.  Cardiovascular: Normal rate, regular rhythm and normal heart sounds.  Pulmonary/Chest: No accessory muscle usage. Tachypnea noted. No respiratory distress. She has decreased breath sounds in the right lower field and the left lower field. She has no wheezes. She has rales in the right lower field.  Abdominal: Soft. There is no tenderness.  Musculoskeletal:  Right lower leg: She exhibits no edema.       Left lower leg: She exhibits no edema.  Neurological: She is alert and oriented to person, place, and time.  Skin: Skin is warm and dry.  Nursing note and vitals reviewed.    ED Treatments / Results  Labs (all labs ordered are listed, but only abnormal results are displayed) Labs Reviewed  COMPREHENSIVE METABOLIC PANEL - Abnormal; Notable for the following components:      Result Value   Glucose, Bld 103 (*)    BUN 27 (*)    Creatinine, Ser 6.04 (*)    Calcium 8.6 (*)    Albumin 3.3 (*)    GFR calc non Af Amer 6 (*)    GFR calc Af Amer 7 (*)    All other components within normal limits  BRAIN NATRIURETIC PEPTIDE - Abnormal; Notable for the following components:   B Natriuretic Peptide 1,685.3 (*)    All other components within normal limits  TROPONIN I - Abnormal; Notable for the following components:   Troponin I 0.05 (*)    All other components within normal limits  CBC WITH DIFFERENTIAL/PLATELET - Abnormal; Notable for the following components:   Hemoglobin 11.7 (*)    RDW 19.0 (*)    Neutro Abs 8.1 (*)    All other components within normal limits  CBC     EKG EKG Interpretation  Date/Time:  Monday June 07 2017 09:05:47 EDT Ventricular Rate:  98 PR Interval:    QRS Duration: 76 QT Interval:  379 QTC Calculation: 484 R Axis:   -3 Text Interpretation:  Sinus tachycardia Atrial premature complex Nonspecific T abnormalities, lateral leads no significant change since 2017 Confirmed by Pricilla LovelessGoldston, Elliana Bal [phone]) on 06/07/2017 9:08:18 AM   Radiology Dg Chest 2 View  Result Date: 06/07/2017 CLINICAL DATA:  Cough and shortness of Breath EXAM: CHEST - 2 VIEW COMPARISON:  10/30/2016 FINDINGS: Cardiac shadow is stable. Aortic calcifications are again seen. Lungs are well aerated bilaterally. Chronic pleural calcifications are noted on the right inferiorly. Diffuse central vascular congestion and interstitial edema is noted. No focal confluent infiltrate is seen. IMPRESSION: Vascular congestion interstitial edema superimposed on chronic changes in the right base. Electronically Signed   By: Alcide CleverMark  Lukens M.D.   On: 06/07/2017 09:30    Procedures Procedures (including critical care time)  Medications Ordered in ED Medications  pentafluoroprop-tetrafluoroeth (GEBAUERS) aerosol 1 application (has no administration in time range)  lidocaine (PF) (XYLOCAINE) 1 % injection 5 mL (has no administration in time range)  lidocaine-prilocaine (EMLA) cream 1 application (has no administration in time range)  0.9 %  sodium chloride infusion (has no administration in time range)  0.9 %  sodium chloride infusion (has no administration in time range)  heparin injection 1,000 Units (has no administration in time range)  alteplase (CATHFLO ACTIVASE) injection 2 mg (has no administration in time range)     Initial Impression / Assessment and Plan / ED Course  I have reviewed the triage vital signs and the nursing notes.  Pertinent labs & imaging results that were available during my care of the patient were reviewed by me and considered in my medical decision  making (see chart for details).     Patient is currently requiring 2 L oxygen to keep her saturations above 90%.  She is in no acute distress and feels much better.  However her chest x-ray confirms fluid overload.  Could be an acute hypertensive cause versus  needing dialysis.  Nephrology has been consulted and will take her to dialysis but given she will probably need back to back treatments including tomorrow she will need to be admitted to the hospitalist service.  No chest pain.  Low level troponin is likely from the renal dysfunction and heart failure.  Final Clinical Impressions(s) / ED Diagnoses   Final diagnoses:  Acute pulmonary edema [facility])    ED Discharge Orders    None       Pricilla Loveless, MD 06/07/17 1635

## 2017-06-07 NOTE — H&P (Addendum)
History and Physical   Nicole Cox ZOX:[id number] DOB: December 04, 1931 DOA: 06/07/2017  Referring MD/NP/PA: Criss Alvine, MD, EDP PCP: Joana Reamer, MD Outpatient Specialists: [facility]  Patient coming from: Home  Chief Complaint: Dyspnea  HPI: Nicole Cox is a 84 y.o. female with a history of ESRD, HTN, COPD, CAD, HTN, hx CVA who presented to the ED for sudden onset of dyspnea this morning. She reported waking up this morning with severe constant shortness of breath and orthopnea. She initially had wheezing improved with breathing treatment at home but symptoms persisted. She required CPAP by EMS and was found to be in respiratory distress improved with supplemental oxygen. CXR demonstrated pulmonary edema, BNP 1685, and troponin below baseline at 0.05.   Review of Systems: Denies fever, chills, weight loss, changes in vision or hearing, headache, cough, sore throat, chest pain, palpitations, abdominal pain, nausea, vomiting, changes in bowel habits, blood in stool, change in bladder habits, myalgias, arthralgias, and rash. All others reviewed and are negative.   Past Medical History:  Diagnosis Date  . CHF (congestive heart failure) (HCC)   . COPD (chronic obstructive pulmonary disease) (HCC)   . Coronary artery disease   . Diabetes mellitus without complication (HCC)   . Heart disease   . Hypertension   . Renal disorder   . Stroke [facility])    Past Surgical History:  Procedure Laterality Date  . ABDOMINAL HYSTERECTOMY    . CHOLECYSTECTOMY    . TONSILLECTOMY     - Nonsmoker, no EtOH or illicit drugs.   reports that she has quit smoking. She has never used smokeless tobacco. She reports that she does not drink alcohol or use drugs. No Known Allergies Family History  Problem Relation Age of Onset  . Heart disease Mother   . Hypertension Mother   . Heart disease Father   . Hypertension Father   . Heart disease Sister   . Diabetes Sister   . Stroke Sister   . Kidney  disease Sister   . Hypertension Sister   . Heart disease Sister   . Kidney disease Sister   . Hypertension Sister   . Kidney disease Daughter   . Hypertension Daughter    - Family history otherwise reviewed and not pertinent.  Prior to Admission medications   Medication Sig Start Date End Date Taking? Authorizing Provider  acetaminophen (TYLENOL) 500 MG tablet Take 500 mg by mouth every 6 (six) hours as needed for pain.    [provider]  albuterol (PROVENTIL HFA;VENTOLIN HFA) 108 (90 Base) MCG/ACT inhaler Inhale 2 puffs into the lungs every 6 (six) hours as needed for wheezing or shortness of breath. 08/17/15   Mikhail, Nita Sells, DO  albuterol (PROVENTIL) (2.5 MG/3ML) 0.083% nebulizer solution USE 1 VIAL VIA NEBULIZER 3 TIMES A DAY AS NEEDED FOR WHEEZING OR SHORTNESS OF BREATH 12/03/15   [provider]  calcium acetate (PHOSLO) 667 MG capsule Take 2,001-2,668 mg by mouth See admin instructions. Take 2668 mg by mouth 3 times daily with meals and take 2001 mg by mouth with snacks    [provider]  Cholecalciferol (VITAMIN D3) 1000 units CAPS Take 2,000 Units by mouth every other day.    [provider]  cloNIDine (CATAPRES - DOSED IN MG/24 HR) 0.2 mg/24hr patch Place 0.2 mg onto the skin every Thursday.  12/03/15   [provider]  fluticasone furoate-vilanterol (BREO ELLIPTA) 100-25 MCG/INH AEPB Inhale 1 puff into the lungs daily. 11/10/15   [provider]  insulin aspart (NOVOLOG) 100 UNIT/ML injection Inject 6 Units into the skin 3 (three) times daily with meals. 12/16/15   Jeralyn BennettZamora, Ezequiel, MD  insulin glargine (LANTUS) 100 UNIT/ML injection Inject 16 Units into the skin at bedtime.    [provider]  lidocaine-prilocaine (EMLA) cream Apply 1 application topically every other day. 10/21/15   [provider]  lisinopril (PRINIVIL,ZESTRIL) 40 MG tablet Take 40 mg by mouth every evening. 12/02/15   [provider]    NIFEdipine (PROCARDIA XL/ADALAT-CC) 60 MG 24 hr tablet Take 60 mg by mouth 2 (two) times daily. 12/03/15   [provider]  pravastatin (PRAVACHOL) 40 MG tablet Take 40 mg by mouth daily.    [provider]  predniSONE (STERAPRED UNI-PAK 21 TAB) 10 MG (21) TBPK tablet Take 6-5-4-3-2-1 tablets by mouth daily till gone. 12/16/15   Jeralyn BennettZamora, Ezequiel, MD  tiotropium (SPIRIVA HANDIHALER) 18 MCG inhalation capsule Place 1 capsule into inhaler and inhale daily. 08/10/14   [provider]  traMADol (ULTRAM) 50 MG tablet Take 50 mg by mouth every 8 (eight) hours as needed for pain.     [provider]    Physical Exam: Vitals:   06/07/17 1505 06/07/17 1530 06/07/17 1600 06/07/17 1630  BP: (!) 209/82 (!) 203/79 (!) 217/76 (!) 200/81  Pulse: 87 87 90 82  Resp: (!) 26 (!) 22    Temp:      TempSrc:      SpO2:      Weight:       Constitutional: 84 y.o. female in no distress, calm demeanor Eyes: Lids and conjunctivae normal, PERRL ENMT: Mucous membranes are moist. Posterior pharynx clear of any exudate or lesions. Poor dentition.  Neck: normal, supple, no masses, no thyromegaly Respiratory: Non-labored tachypnea with supplemental oxygen. Crackles to midlung zones bilaterally. Cardiovascular: Regular rate and rhythm, II/VI early systolic murmur at base, no rubs, or gallops. No carotid bruits. + LE edema. + pedal pulses. Abdomen: Normoactive bowel sounds. No tenderness, non-distended, and no masses palpated. No hepatosplenomegaly. GU: No indwelling catheter Musculoskeletal: No clubbing / cyanosis. No joint deformity upper and lower extremities. Good ROM, no contractures. Normal muscle tone.  Skin: Warm, dry. No rashes, wounds, or ulcers. Neurologic: CN II-XII grossly intact. Gait not assessed. Speech normal. No asterixis. No focal deficits in motor strength or sensation in all extremities.  Psychiatric: Alert and oriented x3. Normal judgment and insight. Mood euthymic  with anxious affect.   Labs on Admission: I have personally reviewed following labs and imaging studies  CBC: Recent Labs  Lab 06/07/17 1000  WBC 9.8  NEUTROABS 8.1*  HGB 11.7*  HCT 38.0  MCV 91.8  PLT 206   Basic Metabolic Panel: Recent Labs  Lab 06/07/17 1000  NA 141  K 3.6  CL 101  CO2 26  GLUCOSE 103*  BUN 27*  CREATININE 6.04*  CALCIUM 8.6*   GFR: CrCl cannot be calculated (Unknown ideal weight.). Liver Function Tests: Recent Labs  Lab 06/07/17 1000  AST 36  ALT 22  ALKPHOS 113  BILITOT 0.6  PROT 7.6  ALBUMIN 3.3*   Cardiac Enzymes: Recent Labs  Lab 06/07/17 1000  TROPONINI 0.05*   Radiological Exams on Admission: Dg Chest 2 View  Result Date: 06/07/2017 CLINICAL DATA:  Cough and shortness of Breath EXAM: CHEST - 2 VIEW COMPARISON:  10/30/2016 FINDINGS: Cardiac shadow is stable. Aortic calcifications are again seen. Lungs are well aerated bilaterally. Chronic pleural calcifications are noted on the  right inferiorly. Diffuse central vascular congestion and interstitial edema is noted. No focal confluent infiltrate is seen. IMPRESSION: Vascular congestion interstitial edema superimposed on chronic changes in the right base. Electronically Signed   By: Alcide Clever M.D.   On: 06/07/2017 09:30   EKG: Independently reviewed. NSR, no ischemic changes.  Assessment/Plan Principal Problem:   Acute pulmonary edema (HCC) Active Problems:   ESRD (end stage renal disease) (HCC)   Hypertension   Diabetes mellitus (HCC)   COPD (chronic obstructive pulmonary disease) (HCC)   CAD (coronary artery disease)   Acute respiratory failure with hypoxia (HCC)   Hypertensive urgency   Chronic anemia   ESRD with volume overload, acute pulmonary edema and hypertensive urgency: - Urgent HD today per nephrology, will likely reduce EDW. - Monitor BP with HD, continue clonidine patch, lisinopril, nifedipine - Hold antibiotics - Check echo  COPD: Doubt acute exacerbation.   - Continue home nebulizer  IDT2DM:  - Continue home lantus, not taking novolog at home as on [facility], so will cover with SSI alone.  DVT prophylaxis: Heparin  Code Status: Full  Family Communication: Daughter at bedside Disposition Plan: Admit to [facility].  Consults called: Nephrology  Admission status: Inpatient    Hazeline Junker, MD [facility] Pager [phone]  If 7PM-7AM, please contact night-coverage [email or web address] Password [facility] 06/07/2017, 5:13 PM

## 2017-06-07 NOTE — ED Triage Notes (Signed)
SOB since yesterday this morning took albuterol and neb, rales throughout. Hx of chf, copd, dialysis pt and refused [facility]. 1 nitro, cpap en route. 180/82 HR110.

## 2017-06-07 NOTE — ED Notes (Signed)
Date and time results received: 06/07/17 10:45 AM  Test: troponin Critical Value: 0.05  Name of Provider Notified: Criss AlvineGoldston MD  Orders Received? Or Actions Taken?: awaiting orders

## 2017-06-07 NOTE — ED Notes (Signed)
Bed: [id number] Expected date:  Expected time:  Means of arrival:  Comments: EMS-dialysis, SOB-bipap

## 2017-06-07 NOTE — ED Notes (Signed)
Unsuccessful IV start x2.  

## 2017-06-07 NOTE — Procedures (Signed)
Patient seen and examined on Hemodialysis. QB 400 mL/ min via LUE AVF, UF goal 4L  Tolerating well so far.  Still requiring O2.  Treatment adjusted as needed.  Bufford ButtnerElizabeth Macari Zalesky MD [facility] pgr [phone] 3:20 PM

## 2017-06-07 NOTE — ED Notes (Signed)
ED TO INPATIENT HANDOFF REPORT  Name/Age/Gender Nicole Cox 84 y.o. female  Code Status Code Status History    Date Active Date Inactive Code Status Order ID Comments User Context   12/12/2015 0557 12/16/2015 1659 Full Code [id number]  Norval Morton, MD Inpatient   08/13/2015 0600 08/17/2015 2137 Full Code [id number]  Rise Patience, MD ED   08/11/2012 2326 08/16/2012 1905 Full Code [id number]  Toy Baker, MD Inpatient      Home/SNF/Other Home  Chief Complaint No admission diagnoses are documented for this encounter.  Level of Care/Admitting Diagnosis ED Disposition    ED Disposition Condition Comment   Admit  Hospital Area: [facility] [[id number]]  Level of Care: Telemetry [5]  Diagnosis: Acute pulmonary edema [facility]) [[id number]]  Admitting Physician: Patrecia Pour [phone]  Attending Physician: Patrecia Pour [phone]  Estimated length of stay: past midnight tomorrow  Certification:: I certify this patient will need inpatient services for at least 2 midnights  PT Class (Do Not Modify): Inpatient [101]  PT Acc Code (Do Not Modify): Private [1]       Medical History Past Medical History:  Diagnosis Date  . CHF (congestive heart failure) ([place])   . COPD (chronic obstructive pulmonary disease) ([place])   . Coronary artery disease   . Diabetes mellitus without complication ([place])   . Heart disease   . Hypertension   . Renal disorder   . Stroke [facility])     Allergies No Known Allergies  IV Location/Drains/Wounds Patient Lines/Drains/Airways Status   Active Line/Drains/Airways    Name:   Placement date:   Placement time:   Site:   Days:   Peripheral IV 06/07/17 Right;Upper Arm   06/07/17    1002    Arm   less than 1   Fistula / Graft Left Upper arm Arteriovenous vein graft   -    -    Upper arm             Labs/Imaging Results for orders placed or performed during the hospital encounter of 06/07/17 (from the past 48 hour(s))  Comprehensive metabolic  panel     Status: Abnormal   Collection Time: 06/07/17 10:00 AM  Result Value Ref Range   Sodium 141 135 - 145 mmol/L   Potassium 3.6 3.5 - 5.1 mmol/L   Chloride 101 101 - 111 mmol/L   CO2 26 22 - 32 mmol/L   Glucose, Bld 103 (H) 65 - 99 mg/dL   BUN 27 (H) 6 - 20 mg/dL   Creatinine, Ser 6.04 (H) 0.44 - 1.00 mg/dL   Calcium 8.6 (L) 8.9 - 10.3 mg/dL   Total Protein 7.6 6.5 - 8.1 g/dL   Albumin 3.3 (L) 3.5 - 5.0 g/dL   AST 36 15 - 41 U/L   ALT 22 14 - 54 U/L   Alkaline Phosphatase 113 38 - 126 U/L   Total Bilirubin 0.6 0.3 - 1.2 mg/dL   GFR calc non Af Amer 6 (L) >60 mL/min   GFR calc Af Amer 7 (L) >60 mL/min    Comment: (NOTE) The eGFR has been calculated using the CKD EPI equation. This calculation has not been validated in all clinical situations. eGFR's persistently <60 mL/min signify possible Chronic Kidney Disease.    Anion gap 14 5 - 15    Comment: Performed at [facility], [place] [address] [place], [place] [address]  Brain natriuretic peptide     Status: Abnormal   Collection  Time: 06/07/17 10:00 AM  Result Value Ref Range   B Natriuretic Peptide 1,685.3 (H) 0.0 - 100.0 pg/mL    Comment: Performed at [facility], [place] [address], [place], [place] [address]  Troponin I     Status: Abnormal   Collection Time: 06/07/17 10:00 AM  Result Value Ref Range   Troponin I 0.05 (HH) <0.03 ng/mL    Comment: CRITICAL RESULT CALLED TO, READ BACK BY AND VERIFIED WITH: ROSSER,M.RN AT 1043 06/07/17 MULLINS,T Performed at [facility], [place] [address], [place], [place] [address]   CBC with Differential     Status: Abnormal   Collection Time: 06/07/17 10:00 AM  Result Value Ref Range   WBC 9.8 4.0 - 10.5 K/uL   RBC 4.14 3.87 - 5.11 MIL/uL   Hemoglobin 11.7 (L) 12.0 - 15.0 g/dL   HCT 38.0 36.0 - 46.0 %   MCV 91.8 78.0 - 100.0 fL   MCH 28.3 26.0 - 34.0 pg   MCHC 30.8 30.0 - 36.0 g/dL   RDW 19.0 (H) 11.5 - 15.5 %   Platelets 206  150 - 400 K/uL   Neutrophils Relative % 83 %   Neutro Abs 8.1 (H) 1.7 - 7.7 K/uL   Lymphocytes Relative 10 %   Lymphs Abs 0.9 0.7 - 4.0 K/uL   Monocytes Relative 5 %   Monocytes Absolute 0.5 0.1 - 1.0 K/uL   Eosinophils Relative 2 %   Eosinophils Absolute 0.2 0.0 - 0.7 K/uL   Basophils Relative 0 %   Basophils Absolute 0.0 0.0 - 0.1 K/uL    Comment: Performed at [facility], [place] [address], [place], [place] [address]   Dg Chest 2 View  Result Date: 06/07/2017 CLINICAL DATA:  Cough and shortness of Breath EXAM: CHEST - 2 VIEW COMPARISON:  10/30/2016 FINDINGS: Cardiac shadow is stable. Aortic calcifications are again seen. Lungs are well aerated bilaterally. Chronic pleural calcifications are noted on the right inferiorly. Diffuse central vascular congestion and interstitial edema is noted. No focal confluent infiltrate is seen. IMPRESSION: Vascular congestion interstitial edema superimposed on chronic changes in the right base. Electronically Signed   By: Inez Catalina M.D.   On: 06/07/2017 09:30    Pending Labs Unresulted Labs (From admission, onward)   Start     Ordered   06/07/17 1240  CBC  Once,   R     06/07/17 1241   Signed and Held  Renal function panel  Tomorrow morning,   R     Signed and Held   Signed and Held  CBC  Tomorrow morning,   R     Signed and Held      Vitals/Pain Today's Vitals   06/07/17 1235 06/07/17 1315 06/07/17 1346 06/07/17 1420  BP: (!) 205/72 (!) 173/155 (!) 196/86 (!) 199/84  Pulse: (!) 101 97 98 98  Resp: (!) 23 (!) 24 (!) 26 19  Temp:      TempSrc:      SpO2: 92% 95% 94% 94%    Isolation Precautions No active isolations  Medications Medications  pentafluoroprop-tetrafluoroeth (GEBAUERS) aerosol 1 application (has no administration in time range)  lidocaine (PF) (XYLOCAINE) 1 % injection 5 mL (has no administration in time range)  lidocaine-prilocaine (EMLA) cream 1 application (has no administration in time range)  0.9  %  sodium chloride infusion (has no administration in time range)  0.9 %  sodium chloride infusion (has no administration in time range)  heparin  injection 1,000 Units (has no administration in time range)  alteplase (CATHFLO ACTIVASE) injection 2 mg (has no administration in time range)    Mobility walks with person assist

## 2017-06-08 ENCOUNTER — Ambulatory Visit (HOSPITAL_COMMUNITY): Payer: Medicare HMO

## 2017-06-08 ENCOUNTER — Other Ambulatory Visit: Payer: Self-pay

## 2017-06-08 DIAGNOSIS — I340 Nonrheumatic mitral (valve) insufficiency: Secondary | ICD-10-CM

## 2017-06-08 DIAGNOSIS — L8990 Pressure ulcer of unspecified site, unspecified stage: Secondary | ICD-10-CM

## 2017-06-08 DIAGNOSIS — J810 Acute pulmonary edema: Principal | ICD-10-CM

## 2017-06-08 LAB — CBC
HEMATOCRIT: 36.3 % (ref 36.0–46.0)
Hemoglobin: 11.1 g/dL — ABNORMAL LOW (ref 12.0–15.0)
MCH: 27.3 pg (ref 26.0–34.0)
MCHC: 30.6 g/dL (ref 30.0–36.0)
MCV: 89.4 fL (ref 78.0–100.0)
Platelets: 151 10*3/uL (ref 150–400)
RBC: 4.06 MIL/uL (ref 3.87–5.11)
RDW: 18.8 % — AB (ref 11.5–15.5)
WBC: 8.4 10*3/uL (ref 4.0–10.5)

## 2017-06-08 LAB — GLUCOSE, CAPILLARY
GLUCOSE-CAPILLARY: 129 mg/dL — AB (ref 65–99)
GLUCOSE-CAPILLARY: 147 mg/dL — AB (ref 65–99)
GLUCOSE-CAPILLARY: 69 mg/dL (ref 65–99)
GLUCOSE-CAPILLARY: 94 mg/dL (ref 65–99)
Glucose-Capillary: 111 mg/dL — ABNORMAL HIGH (ref 65–99)
Glucose-Capillary: 55 mg/dL — ABNORMAL LOW (ref 65–99)
Glucose-Capillary: 162 mg/dL — ABNORMAL HIGH (ref 65–99)

## 2017-06-08 LAB — RENAL FUNCTION PANEL
ALBUMIN: 2.8 g/dL — AB (ref 3.5–5.0)
ANION GAP: 11 (ref 5–15)
BUN: 11 mg/dL (ref 6–20)
CALCIUM: 8.2 mg/dL — AB (ref 8.9–10.3)
CO2: 27 mmol/L (ref 22–32)
Chloride: 98 mmol/L — ABNORMAL LOW (ref 101–111)
Creatinine, Ser: 3.54 mg/dL — ABNORMAL HIGH (ref 0.44–1.00)
GFR calc Af Amer: 13 mL/min — ABNORMAL LOW (ref >60)
GFR, EST NON AFRICAN AMERICAN: 11 mL/min — AB (ref >60)
Glucose, Bld: 180 mg/dL — ABNORMAL HIGH (ref 65–99)
PHOSPHORUS: 2.3 mg/dL — AB (ref 2.5–4.6)
POTASSIUM: 3.7 mmol/L (ref 3.5–5.1)
Sodium: 136 mmol/L (ref 135–145)

## 2017-06-08 LAB — TROPONIN I
Troponin I: 0.06 ng/mL (ref <0.03)
Troponin I: 0.07 ng/mL (ref <0.03)

## 2017-06-08 LAB — ECHOCARDIOGRAM COMPLETE
Height: 63 in
WEIGHTICAEL: 1848.34 [oz_av]

## 2017-06-08 LAB — MRSA PCR SCREENING: MRSA BY PCR: POSITIVE — AB

## 2017-06-08 MED ORDER — CALCIUM ACETATE (PHOS BINDER) 667 MG PO CAPS
1334.0000 mg | ORAL_CAPSULE | Freq: Three times a day (TID) | ORAL | Status: DC
  Administered 2017-06-08 – 2017-06-09 (×5): 1334 mg via ORAL
  Filled 2017-06-08 (×6): qty 2

## 2017-06-08 MED ORDER — NEPRO/CARBSTEADY PO LIQD
237.0000 mL | Freq: Two times a day (BID) | ORAL | Status: DC
  Administered 2017-06-08 – 2017-06-09 (×3): 237 mL via ORAL
  Filled 2017-06-08 (×8): qty 237

## 2017-06-08 MED ORDER — LORAZEPAM 1 MG PO TABS
1.0000 mg | ORAL_TABLET | Freq: Once | ORAL | Status: AC
  Administered 2017-06-08: 1 mg via ORAL
  Filled 2017-06-08: qty 1

## 2017-06-08 MED ORDER — CALCIUM ACETATE (PHOS BINDER) 667 MG PO CAPS
667.0000 mg | ORAL_CAPSULE | ORAL | Status: DC
  Administered 2017-06-08 – 2017-06-09 (×2): 667 mg via ORAL
  Filled 2017-06-08: qty 1

## 2017-06-08 MED ORDER — INSULIN GLARGINE 100 UNIT/ML ~~LOC~~ SOLN: 12.0000 [IU] | Freq: Every day | SUBCUTANEOUS | Status: DC

## 2017-06-08 MED ORDER — DEXTROSE 50 % IV SOLN
INTRAVENOUS | Status: AC
  Filled 2017-06-08: qty 50

## 2017-06-08 MED ORDER — CHLORHEXIDINE GLUCONATE CLOTH 2 % EX PADS
6.0000 | MEDICATED_PAD | Freq: Every day | CUTANEOUS | Status: DC
  Administered 2017-06-09 – 2017-06-10 (×2): 6 via TOPICAL

## 2017-06-08 MED ORDER — MUPIROCIN 2 % EX OINT
1.0000 "application " | TOPICAL_OINTMENT | Freq: Two times a day (BID) | CUTANEOUS | Status: DC
  Administered 2017-06-08 – 2017-06-10 (×4): 1 via NASAL
  Filled 2017-06-08: qty 22

## 2017-06-08 NOTE — Progress Notes (Signed)
  [facility] Progress Note   Assessment/ Plan:   1 Acute hypoxic RF: appears to be pulm edema as evidenced on CXR.  Don't suspect pneumonia at this point- no infectious symptoms.  Emergent HD yesterday and on schedule today.  Pt reports EDW at 127 lbs and she will need a lower EDW on d/c.   2 ESRD:TTS at [facility]- records pending, have been requested and have not been sent yet as of this AM (have called and requested this AM) 3 Hypertension: expect to improve with UF. 4. Anemia of ESRD: Will get records and dose iron and ESA as appropriate 5. Metabolic Bone Disease: VDRA and binders as appropriate- decreasing phoslo to 2 AC TID and 1 with snacks 6.  Dispo: if can be weaned off O2 after HD today I think Ok for d/c from renal perspective.    Subjective:    Tolerated dialysis well last night with 3.L L off.  Still hypertensive and requiring O2 this AM.     Objective:   BP (!) 181/80   Pulse 78   Temp 98.7 F (37.1 C) (Oral)   Resp (!) 23   Ht 5\' 3"  (1.6 m)   Wt 54.9 kg (121 lb 0.5 oz) Comment: Standing weight  SpO2 100%   BMI 21.44 kg/m   Physical Exam: GEN sitting in bed,  O2 in place HEENT EOMI, PERRL, anicteric NECK JVD improved today PULM normal WOB, crackles still present in bases CV tachycardic, soft systolic murmur ABD soft, nontender EXT no LLE today NEURO AAO x 3 SKIN no rashes ACCESS: LUE AVF + T/B, some thinned areas, accessed with 15 g needles    Labs: BMET Recent Labs  Lab 06/07/17 1000 06/08/17 0233  NA 141 136  K 3.6 3.7  CL 101 98*  CO2 26 27  GLUCOSE 103* 180*  BUN 27* 11  CREATININE 6.04* 3.54*  CALCIUM 8.6* 8.2*  PHOS  --  2.3*   CBC Recent Labs  Lab 06/07/17 1000 06/08/17 0233  WBC 9.8 8.4  NEUTROABS 8.1*  --   HGB 11.7* 11.1*  HCT 38.0 36.3  MCV 91.8 89.4  PLT 206 151    @IMGRELPRIORS @ Medications:    . calcium acetate  2,668 mg Oral TID WC   And  . calcium acetate  2,001 mg Oral With snacks  .  cholecalciferol  2,000 Units Oral QODAY  . feeding supplement (NEPRO CARB STEADY)  237 mL Oral BID BM  . fluticasone furoate-vilanterol  1 puff Inhalation Daily  . heparin  5,000 Units Subcutaneous Q8H  . insulin aspart  0-9 Units Subcutaneous TID WC  . insulin glargine  12 Units Subcutaneous QHS  . lisinopril  40 mg Oral QPM  . NIFEdipine  60 mg Oral BID  . pravastatin  40 mg Oral Daily  . sodium chloride flush  3 mL Intravenous Q12H  . tiotropium  1 capsule Inhalation Daily     Bufford ButtnerElizabeth Brit Wernette MD [facility] pgr [phone] 06/08/2017, 8:53 AM

## 2017-06-08 NOTE — Progress Notes (Signed)
  Echocardiogram 2D Echocardiogram has been performed.  Leta JunglingCooper, Emaad Nanna M 06/08/2017, 3:11 PM

## 2017-06-08 NOTE — Procedures (Signed)
Patient seen and examined on Hemodialysis. Qb 400 via LUE AVF, UF goal 3L.  Tolerating rx well.  Treatment adjusted as needed.  Bufford ButtnerElizabeth Khalid Lacko MD [facility] pgr [phone] 8:58 AM

## 2017-06-08 NOTE — Progress Notes (Signed)
CRITICAL VALUE ALERT  Critical Value:  Troponin 0.06  Date & Time Notied:  06/08/17 [id number]  Provider Notified: Dr. Sunnie Nielsenegalado  Orders Received/Actions taken: Patient in HD. Notified HD nurse

## 2017-06-08 NOTE — Progress Notes (Addendum)
PROGRESS NOTE    Nicole Cox  ZOX:[id number] DOB: 05-Nov-1931 DOA: 06/07/2017 PCP: Joana Reamer, MD    Brief Narrative by [name]: Nicole Cox is a 84 y.o. female with a history of ESRD, HTN, COPD, CAD, HTN, hx CVA who presented to the ED for sudden onset of dyspnea the morning of admission. She reported waking up this morning with severe constant shortness of breath and orthopnea. She initially had wheezing, improved with breathing treatment at home but symptoms persisted. She required CPAP by EMS and was found to be in respiratory distress improved with supplemental oxygen. CXR demonstrated pulmonary edema, BNP 1685, and troponin below baseline at 0.05.     Assessment & Plan:   Principal Problem:   Acute pulmonary edema (HCC) Active Problems:   ESRD (end stage renal disease) (HCC)   Hypertension   Diabetes mellitus (HCC)   COPD (chronic obstructive pulmonary disease) (HCC)   CAD (coronary artery disease)   Acute respiratory failure with hypoxia (HCC)   Hypertensive urgency   Chronic anemia   Pressure injury of skin  1-Acute hypoxic respiratory failure; pulmonary edema.  Received HD 3-25 and again 3-26. Appreciate nephrology.  Taper oxygen off.  ECHO ordered   Mild elevation of troponin; in setting renal failure, pulmonary edema  2-ESRD; on HD; T, Th, S Hd today.   COPD; continue with nebulizer.   DM type 2; hypoglycemia; hold long acting insulin.  SSI.   Anemia; follow hb.   HTN; continue with Nifedipine, lisinopril.    DVT prophylaxis: Heparin Code Status: Full code Family Communication: no family at bedside.  Disposition Plan: home when respiratory status improved.   Consultants:   Nephrology    Procedures:   ECHO pending   Antimicrobials:     Subjective: She is feeling ok, report runny nose.  Breathing better   Objective: Vitals:   06/07/17 1845 06/07/17 2000 06/07/17 2348 06/08/17 0300  BP: (!) 198/84 (!) 228/79 (!) 152/65     Pulse: 85 82 78   Resp: (!) 23 20    Temp: 98.8 F (37.1 C) 98.8 F (37.1 C) 99.4 F (37.4 C)   TempSrc: Oral Oral Oral   SpO2: 100% 95% 100%   Weight: 60.4 kg (133 lb 2.5 oz) 58 kg (127 lb 13.9 oz)  59.1 kg (130 lb 4.7 oz)  Height:  5\' 3"  (1.6 m)      Intake/Output Summary (Last 24 hours) at 06/08/2017 0727 Last data filed at 06/07/2017 2300 Gross per 24 hour  Intake 480 ml  Output 3500 ml  Net -3020 ml   Filed Weights   06/07/17 1845 06/07/17 2000 06/08/17 0300  Weight: 60.4 kg (133 lb 2.5 oz) 58 kg (127 lb 13.9 oz) 59.1 kg (130 lb 4.7 oz)    Examination:  General exam: Appears calm and comfortable  Respiratory system: Crackles bases. Marland Kitchen Respiratory effort normal. Cardiovascular system: S1 & S2 heard, RRR. No JVD, murmurs, rubs, gallops or clicks. No pedal edema. Gastrointestinal system: Abdomen is nondistended, soft and nontender. No organomegaly or masses felt. Normal bowel sounds heard. Central nervous system: Alert and oriented. No focal neurological deficits. Extremities: Symmetric 5 x 5 power. Skin: No rashes, lesions or ulcers Psychiatry: Judgement and insight appear normal. Mood & affect appropriate.     Data Reviewed: I have personally reviewed following labs and imaging studies  CBC: Recent Labs  Lab 06/07/17 1000 06/08/17 0233  WBC 9.8 8.4  NEUTROABS 8.1*  --   HGB 11.7* 11.1*  HCT 38.0 36.3  MCV 91.8 89.4  PLT 206 151   Basic Metabolic Panel: Recent Labs  Lab 06/07/17 1000 06/08/17 0233  NA 141 136  K 3.6 3.7  CL 101 98*  CO2 26 27  GLUCOSE 103* 180*  BUN 27* 11  CREATININE 6.04* 3.54*  CALCIUM 8.6* 8.2*  PHOS  --  2.3*   GFR: Estimated Creatinine Clearance: 10 mL/min (A) (by C-G formula based on SCr of 3.54 mg/dL (H)). Liver Function Tests: Recent Labs  Lab 06/07/17 1000 06/08/17 0233  AST 36  --   ALT 22  --   ALKPHOS 113  --   BILITOT 0.6  --   PROT 7.6  --   ALBUMIN 3.3* 2.8*   No results for input(s): LIPASE, AMYLASE in  the last 168 hours. No results for input(s): AMMONIA in the last 168 hours. Coagulation Profile: No results for input(s): INR, PROTIME in the last 168 hours. Cardiac Enzymes: Recent Labs  Lab 06/07/17 1000  TROPONINI 0.05*   BNP (last 3 results) No results for input(s): PROBNP in the last 8760 hours. HbA1C: No results for input(s): HGBA1C in the last 72 hours. CBG: Recent Labs  Lab 06/07/17 1755 06/07/17 1814 06/08/17 0019 06/08/17 0634  GLUCAP 64* 86 147* 69   Lipid Profile: No results for input(s): CHOL, HDL, LDLCALC, TRIG, CHOLHDL, LDLDIRECT in the last 72 hours. Thyroid Function Tests: No results for input(s): TSH, T4TOTAL, FREET4, T3FREE, THYROIDAB in the last 72 hours. Anemia Panel: No results for input(s): VITAMINB12, FOLATE, FERRITIN, TIBC, IRON, RETICCTPCT in the last 72 hours. Sepsis Labs: No results for input(s): PROCALCITON, LATICACIDVEN in the last 168 hours.  No results found for this or any previous visit (from the past 240 hour(s)).       Radiology Studies: Dg Chest 2 View  Result Date: 06/07/2017 CLINICAL DATA:  Cough and shortness of Breath EXAM: CHEST - 2 VIEW COMPARISON:  10/30/2016 FINDINGS: Cardiac shadow is stable. Aortic calcifications are again seen. Lungs are well aerated bilaterally. Chronic pleural calcifications are noted on the right inferiorly. Diffuse central vascular congestion and interstitial edema is noted. No focal confluent infiltrate is seen. IMPRESSION: Vascular congestion interstitial edema superimposed on chronic changes in the right base. Electronically Signed   By: Alcide CleverMark  Lukens M.D.   On: 06/07/2017 09:30        Scheduled Meds: . calcium acetate  2,668 mg Oral TID WC   And  . calcium acetate  2,001 mg Oral With snacks  . cholecalciferol  2,000 Units Oral QODAY  . feeding supplement (NEPRO CARB STEADY)  237 mL Oral BID BM  . fluticasone furoate-vilanterol  1 puff Inhalation Daily  . heparin  5,000 Units Subcutaneous Q8H   . insulin aspart  0-5 Units Subcutaneous QHS  . insulin aspart  0-9 Units Subcutaneous TID WC  . insulin glargine  16 Units Subcutaneous QHS  . lisinopril  40 mg Oral QPM  . NIFEdipine  60 mg Oral BID  . pravastatin  40 mg Oral Daily  . sodium chloride flush  3 mL Intravenous Q12H  . tiotropium  1 capsule Inhalation Daily   Continuous Infusions: . sodium chloride    . sodium chloride       LOS: 1 day    Time spent: 35 minutes.     Alba CoryBelkys A Regalado, MD [facility] Pager [phone]  If 7PM-7AM, please contact night-coverage [email or web address] Password [email or web address] 06/08/2017, 7:27 AM

## 2017-06-09 LAB — CBC
HEMATOCRIT: 40.9 % (ref 36.0–46.0)
HEMOGLOBIN: 12.8 g/dL (ref 12.0–15.0)
MCH: 27.9 pg (ref 26.0–34.0)
MCHC: 31.3 g/dL (ref 30.0–36.0)
MCV: 89.3 fL (ref 78.0–100.0)
Platelets: 180 10*3/uL (ref 150–400)
RBC: 4.58 MIL/uL (ref 3.87–5.11)
RDW: 18.1 % — ABNORMAL HIGH (ref 11.5–15.5)
WBC: 9.2 10*3/uL (ref 4.0–10.5)

## 2017-06-09 LAB — BASIC METABOLIC PANEL
Anion gap: 11 (ref 5–15)
BUN: 8 mg/dL (ref 6–20)
CHLORIDE: 99 mmol/L — AB (ref 101–111)
CO2: 26 mmol/L (ref 22–32)
Calcium: 9 mg/dL (ref 8.9–10.3)
Creatinine, Ser: 3.44 mg/dL — ABNORMAL HIGH (ref 0.44–1.00)
GFR calc Af Amer: 13 mL/min — ABNORMAL LOW (ref >60)
GFR calc non Af Amer: 11 mL/min — ABNORMAL LOW (ref >60)
GLUCOSE: 143 mg/dL — AB (ref 65–99)
POTASSIUM: 4.2 mmol/L (ref 3.5–5.1)
Sodium: 136 mmol/L (ref 135–145)

## 2017-06-09 LAB — GLUCOSE, CAPILLARY
Glucose-Capillary: 123 mg/dL — ABNORMAL HIGH (ref 65–99)
Glucose-Capillary: 114 mg/dL — ABNORMAL HIGH (ref 65–99)
Glucose-Capillary: 167 mg/dL — ABNORMAL HIGH (ref 65–99)
Glucose-Capillary: 74 mg/dL (ref 65–99)
Glucose-Capillary: 199 mg/dL — ABNORMAL HIGH (ref 65–99)

## 2017-06-09 LAB — MAGNESIUM: Magnesium: 2 mg/dL (ref 1.7–2.4)

## 2017-06-09 MED ORDER — METOPROLOL TARTRATE 12.5 MG HALF TABLET
12.5000 mg | ORAL_TABLET | Freq: Two times a day (BID) | ORAL | Status: DC
  Administered 2017-06-09 – 2017-06-10 (×3): 12.5 mg via ORAL
  Filled 2017-06-09 (×3): qty 1

## 2017-06-09 MED ORDER — NEPRO/CARBSTEADY PO LIQD: 237.0000 mL | Freq: Two times a day (BID) | ORAL | 0 refills | Status: AC

## 2017-06-09 MED ORDER — INSULIN GLARGINE 100 UNIT/ML ~~LOC~~ SOLN: 5.0000 [IU] | Freq: Every day | SUBCUTANEOUS | 0 refills | Status: DC

## 2017-06-09 MED ORDER — CALCIUM ACETATE (PHOS BINDER) 667 MG PO CAPS: 1334.0000 mg | ORAL_CAPSULE | Freq: Three times a day (TID) | ORAL | 0 refills | Status: DC

## 2017-06-09 MED ORDER — ONDANSETRON HCL 4 MG/2ML IJ SOLN
4.0000 mg | Freq: Four times a day (QID) | INTRAMUSCULAR | Status: DC | PRN
  Administered 2017-06-09: 4 mg via INTRAVENOUS
  Filled 2017-06-09: qty 2

## 2017-06-09 MED ORDER — LORAZEPAM 1 MG PO TABS
1.0000 mg | ORAL_TABLET | Freq: Once | ORAL | Status: AC
  Administered 2017-06-09: 1 mg via ORAL
  Filled 2017-06-09: qty 1

## 2017-06-09 MED ORDER — CALCIUM ACETATE (PHOS BINDER) 667 MG PO CAPS: 667.0000 mg | ORAL_CAPSULE | ORAL | 0 refills | Status: DC

## 2017-06-09 MED ORDER — INSULIN GLARGINE 100 UNIT/ML ~~LOC~~ SOLN
5.0000 [IU] | Freq: Every day | SUBCUTANEOUS | Status: DC
  Administered 2017-06-09: 5 [IU] via SUBCUTANEOUS
  Filled 2017-06-09 (×2): qty 0.05

## 2017-06-09 NOTE — Evaluation (Signed)
Occupational Therapy Evaluation Patient Details Name: Nicole Cox MRN: [id number] DOB: [birth year] Today's Date: 06/09/2017    History of Present Illness Nicole Cox is a 84 y.o. female with a history of ESRD, HTN, COPD, CAD, HTN, hx CVA who presented to the ED for sudden onset of dyspnea the morning of admission. She reported waking up this morning with severe constant shortness of breath and orthopnea. She initially had wheezing, improved with breathing treatment at home but symptoms persisted. She required CPAP by EMS and was found to be in respiratory distress improved with supplemental oxygen. CXR demonstrated pulmonary edema, BNP 1685, and troponin below baseline at 0.05.    Clinical Impression   Pt uses a rollator for mobility and is assisted in and out of her tub and for all ADL at baseline. Pt presents with decreased activity tolerance, poor standing balance and impaired safety awareness making her a high fall risk. Pt currently requires min-min guard assist for mobility and set up to min assist for ADL. Pt is eager to go home. She reports she has a supportive family at home to assist her. Will follow acutely.     Follow Up Recommendations  No OT follow up    Equipment Recommendations       Recommendations for Other Services       Precautions / Restrictions Precautions Precautions: Fall Restrictions Weight Bearing Restrictions: No      Mobility Bed Mobility Overal bed mobility: Needs Assistance Bed Mobility: Sit to Supine      Sit to supine: Min assist   General bed mobility comments: assisted LEs minimally back into bed  Transfers Overall transfer level: Needs assistance Equipment used: Rolling walker (2 wheeled) Transfers: Sit to/from Stand Sit to Stand: Min assist         General transfer comment: cues for hand placement, min assist to steady    Balance Overall balance assessment: Needs assistance Sitting-balance support: No upper extremity  supported;Feet supported Sitting balance-Leahy Scale: Fair     Standing balance support: Bilateral upper extremity supported;During functional activity Standing balance-Leahy Scale: Poor Standing balance comment: relies on RW or external support for standing                           ADL either performed or assessed with clinical judgement   ADL Overall ADL's : Needs assistance/impaired Eating/Feeding: Independent;Sitting   Grooming: Wash/dry hands;Min guard;Standing   Upper Body Bathing: Set up;Sitting   Lower Body Bathing: Sit to/from stand;Min guard   Upper Body Dressing : Set up;Sitting   Lower Body Dressing: Sit to/from stand;Min guard   Toilet Transfer: Min guard;Ambulation;BSC;RW   Toileting- Architect and Hygiene: Min guard;Sit to/from stand       Functional mobility during ADLs: Min guard;Rolling walker       Vision Patient Visual Report: No change from baseline       Perception     Praxis      Pertinent Vitals/Pain Pain Assessment: No/denies pain     Hand Dominance Right   Extremity/Trunk Assessment Upper Extremity Assessment Upper Extremity Assessment: Overall WFL for tasks assessed   Lower Extremity Assessment Lower Extremity Assessment: Defer to PT evaluation   Cervical / Trunk Assessment Cervical / Trunk Assessment: Kyphotic   Communication Communication Communication: No difficulties   Cognition Arousal/Alertness: Awake/alert Behavior During Therapy: WFL for tasks assessed/performed Overall Cognitive Status: No family/caregiver present to determine baseline cognitive functioning Area of Impairment: Safety/judgement  Safety/Judgement: Decreased awareness of safety;Decreased awareness of deficits     General Comments: pt seated at end of recliner upon arrival, stated she had been "fighting with the chair," but did not request help   General Comments       Exercises      Shoulder Instructions      Home Living Family/patient expects to be discharged to:: Private residence Living Arrangements: Children;Spouse/significant other Available Help at Discharge: Family;Available 24 hours/day Type of Home: House Home Access: Stairs to enter Entergy CorporationEntrance Stairs-Number of Steps: 4 Entrance Stairs-Rails: Right;Left;Can reach both Home Layout: Multi-level;Able to live on main level with bedroom/bathroom     Bathroom Shower/Tub: Tub/shower unit   Bathroom Toilet: Standard     Home Equipment: Walker - 4 wheels          Prior Functioning/Environment Level of Independence: Independent with assistive device(s);Needs assistance  Gait / Transfers Assistance Needed: used RW for gait ADL's / Homemaking Assistance Needed: Pt got down in tub with assist, daughter assists with bathing, meal prep and housekeeping            OT Problem List: Decreased activity tolerance;Impaired balance (sitting and/or standing);Decreased safety awareness;Decreased knowledge of use of DME or AE;Cardiopulmonary status limiting activity      OT Treatment/Interventions: Self-care/ADL training;Energy conservation;Patient/family education;Balance training;Therapeutic activities;DME and/or AE instruction    OT Goals(Current goals can be found in the care plan section) Acute Rehab OT Goals Patient Stated Goal: to go home ASAP OT Goal Formulation: With patient Time For Goal Achievement: 06/23/17 Potential to Achieve Goals: Good ADL Goals Pt Will Perform Grooming: with supervision;standing Pt Will Perform Lower Body Bathing: with supervision;sit to/from stand Pt Will Perform Lower Body Dressing: with supervision;sit to/from stand Pt Will Transfer to Toilet: with supervision;ambulating;regular height toilet Pt Will Perform Toileting - Clothing Manipulation and hygiene: with supervision;sit to/from stand  OT Frequency: Min 2X/week   Barriers to D/C:            Co-evaluation               AM-PAC PT "6 Clicks" Daily Activity     Outcome Measure Help from another person eating meals?: None Help from another person taking care of personal grooming?: A Little Help from another person toileting, which includes using toliet, bedpan, or urinal?: A Little Help from another person bathing (including washing, rinsing, drying)?: A Little Help from another person to put on and taking off regular upper body clothing?: None Help from another person to put on and taking off regular lower body clothing?: A Little 6 Click Score: 20   End of Session Equipment Utilized During Treatment: Gait belt;Rolling walker  Activity Tolerance: Patient tolerated treatment well Patient left: in bed;with call bell/phone within reach;with bed alarm set  OT Visit Diagnosis: Unsteadiness on feet (R26.81)                Time: 4098-11911205-1219 OT Time Calculation (min): 14 min Charges:  OT General Charges $OT Visit: 1 Visit OT Evaluation $OT Eval Moderate Complexity: 1 Mod G-Codes:     06/09/2017 Martie RoundJulie Chasmine Lender, OTR/L Pager: [phone]  Iran PlanasMayberry, [place] BailiffJulie Lynn 06/09/2017, 12:31 PM

## 2017-06-09 NOTE — Progress Notes (Signed)
CRITICAL VALUE ALERT  Critical Value:  Burst of SVT  Date & Time Notied: 06/09/2017 @ 0830 Provider Notified: Dr Sunnie Nielsenegalado  Orders Received/Actions taken: Pt Assessed, VS's taken, orders received and will be followed.

## 2017-06-09 NOTE — Progress Notes (Signed)
SATURATION QUALIFICATIONS: (This note is used to comply with regulatory documentation for home oxygen)  Patient Saturations on Room Air at Rest = 94%  Patient Saturations on Room Air while Ambulating = 86%  Patient Saturations on 2 Liters of oxygen while Ambulating = 94%  Please briefly explain why patient needs home oxygen:Pt requires O2 with activity as she desats unless O2 in place.  Encouraged incentive spirometer.  Pt able to return demonstration.  Thanks.  [facility] Endoscopy CenterDawn Johnmark Geiger,PT Acute Rehabilitation [phone] (pager)

## 2017-06-09 NOTE — Progress Notes (Signed)
Initial Nutrition Assessment  DOCUMENTATION CODES:   Non-severe (moderate) malnutrition in context of chronic illness  INTERVENTION:    Nepro Shake po BID, each supplement provides 425 kcal and 19 grams protein  NUTRITION DIAGNOSIS:   Moderate Malnutrition related to chronic illness(ESRD on HD, COPD) as evidenced by moderate fat depletion, moderate muscle depletion  GOAL:   Patient will meet greater than or equal to 90% of their needs  MONITOR:   PO intake, Supplement acceptance, Labs, Skin, Weight trends, I & O's  REASON FOR ASSESSMENT:   Malnutrition Screening Tool  ASSESSMENT:   84 y.o. Female with a history of ESRD, HTN, COPD, CAD, HTN, hx CVA who presented to the ED for sudden onset of dyspnea the morning of admission. She reported waking up this morning with severe constant shortness of breath and orthopnea. She initially had wheezing, improved with breathing treatment at home but symptoms persisted. She required CPAP by EMS and was found to be in respiratory distress improved with supplemental oxygen. CXR demonstrated pulmonary edema, BNP 1685, and troponin below baseline at 0.05.   Pt slumped over in her chair upon RD visit. Reports a decreased appetite. PO intake 50% per flowsheet records. Noted pt with missing teeth. She reveals she sometimes has "spells" after she eats a couple bites of food she will "gag". States this has been going on x 2-3 months. When she is not experiencing this she consumes 3 meals per day:  Breakfast: eggs, toast, sausage and grits Lunch: meat, starch & veggie Dinner: meat, starch & veggie  Pt lives with her 3 daughters who cook meals for her. Also shares she's had a significant weight loss of 100 lb in the last 2 years. She has Nepro Shakes ordered and is drinking some. Labs and medications reviewed. CBG's [phone].  NUTRITION - FOCUSED PHYSICAL EXAM:    Most Recent Value  Orbital Region  Mild depletion  Upper Arm Region   Moderate depletion  Thoracic and Lumbar Region  Moderate depletion  Buccal Region  Moderate depletion  Temple Region  Mild depletion  Clavicle Bone Region  Moderate depletion  Clavicle and Acromion Bone Region  Moderate depletion  Scapular Bone Region  Moderate depletion  Dorsal Hand  Moderate depletion  Patellar Region  Moderate depletion  Anterior Thigh Region  Moderate depletion  Posterior Calf Region  Moderate depletion  Edema (RD Assessment)  None     Diet Order:  Diet renal with fluid restriction Fluid restriction: 1200 mL Fluid; Room service appropriate? Yes; Fluid consistency: Thin  EDUCATION NEEDS:   No education needs have been identified at this time  Skin:  Skin Assessment: Skin Integrity Issues: Skin Integrity Issues:: Unstageable Unstageable: full thickness tissue loss to buttocks  Last BM:  3/25   Intake/Output Summary (Last 24 hours) at 06/09/2017 1419 Last data filed at 06/09/2017 1300 Gross per 24 hour  Intake 1320 ml  Output 200 ml  Net 1120 ml   Height:   Ht Readings from Last 1 Encounters:  06/07/17 5\' 3"  (1.6 m)    Weight:   Wt Readings from Last 1 Encounters:  06/09/17 121 lb 14.6 oz (55.3 kg)    Ideal Body Weight:  52.2 kg  BMI:  Body mass index is 21.6 kg/m.  Estimated Nutritional Needs:   Kcal:  1500-1700  Protein:  60-75 gm  Fluid:  1200 ml   Maureen ChattersKatie Helaina Stefano, RD, LDN Pager #: [phone] After-Hours Pager #: [phone]

## 2017-06-09 NOTE — Progress Notes (Signed)
  [facility] Progress Note   Assessment/ Plan:   1 Acute hypoxic RF: appears to be pulm edema as evidenced on CXR.  Don't suspect pneumonia at this point- no infectious symptoms.  Emergent HD 3/25, on schedule 3/26. Pt reports EDW at 127 lbs and she will need a lower EDW on d/c.   2 ESRD:TTS at [facility]- records pending, have been requested and have not been sent yet as of this AM (have called and requested this AM) 3 Hypertension: expect to improve with UF. 4. Anemia of ESRD: Will get records and dose iron and ESA as appropriate- hgb 12.8, doesn't need ESA now 5. Metabolic Bone Disease: VDRA and binders as appropriate- decreasing phoslo to 2 AC TID and 1 with snacks 6.  SVT: per primary 7.  Dispo: pending   Subjective:    S/p HD yesterday with good UF.  Had some SVT this AM.  Desatted with ambulation and so ? Home O2.     Objective:   BP (!) 158/64 (BP Location: Right Arm)   Pulse 86   Temp 98.3 F (36.8 C) (Oral)   Resp 20   Ht 5\' 3"  (1.6 m)   Wt 55.3 kg (121 lb 14.6 oz)   SpO2 96%   BMI 21.60 kg/m   Physical Exam: GEN sitting in bed, off [place] O2 HEENT EOMI, PERRL, anicteric NECK no JVD PULM normal WOB, largely clear CV tachycardic, soft systolic murmur ABD soft, nontender EXT no LLE today NEURO AAO x 3 SKIN no rashes ACCESS: LUE AVF + T/B, some thinned areas    Labs: BMET Recent Labs  Lab 06/07/17 1000 06/08/17 0233 06/09/17 0902  NA 141 136 136  K 3.6 3.7 4.2  CL 101 98* 99*  CO2 26 27 26   GLUCOSE 103* 180* 143*  BUN 27* 11 8  CREATININE 6.04* 3.54* 3.44*  CALCIUM 8.6* 8.2* 9.0  PHOS  --  2.3*  --    CBC Recent Labs  Lab 06/07/17 1000 06/08/17 0233 06/09/17 0902  WBC 9.8 8.4 9.2  NEUTROABS 8.1*  --   --   HGB 11.7* 11.1* 12.8  HCT 38.0 36.3 40.9  MCV 91.8 89.4 89.3  PLT 206 151 180    @IMGRELPRIORS @ Medications:    . calcium acetate  1,334 mg Oral TID WC   And  . calcium acetate  667 mg Oral With snacks  . Chlorhexidine  Gluconate Cloth  6 each Topical Q0600  . feeding supplement (NEPRO CARB STEADY)  237 mL Oral BID BM  . fluticasone furoate-vilanterol  1 puff Inhalation Daily  . heparin  5,000 Units Subcutaneous Q8H  . insulin aspart  0-9 Units Subcutaneous TID WC  . lisinopril  40 mg Oral QPM  . metoprolol tartrate  12.5 mg Oral BID  . mupirocin ointment  1 application Nasal BID  . NIFEdipine  60 mg Oral BID  . pravastatin  40 mg Oral Daily  . sodium chloride flush  3 mL Intravenous Q12H  . tiotropium  1 capsule Inhalation Daily     Bufford ButtnerElizabeth Breckyn Troyer MD [facility] pgr [phone] 06/09/2017, 11:12 AM

## 2017-06-09 NOTE — Progress Notes (Signed)
PROGRESS NOTE    Nicole Cox  BJY:[id number] DOB: [birth year] DOA: 06/07/2017 PCP: Joana Reamer, MD    Brief Narrative by [name]: Nicole Cox is a 84 y.o. female with a history of ESRD, HTN, COPD, CAD, HTN, hx CVA who presented to the ED for sudden onset of dyspnea the morning of admission. She reported waking up this morning with severe constant shortness of breath and orthopnea. She initially had wheezing, improved with breathing treatment at home but symptoms persisted. She required CPAP by EMS and was found to be in respiratory distress improved with supplemental oxygen. CXR demonstrated pulmonary edema, BNP 1685, and troponin below baseline at 0.05.   Assessment & Plan:   Principal Problem:   Acute pulmonary edema (HCC) Active Problems:   ESRD (end stage renal disease) (HCC)   Hypertension   Diabetes mellitus (HCC)   COPD (chronic obstructive pulmonary disease) (HCC)   CAD (coronary artery disease)   Acute respiratory failure with hypoxia (HCC)   Hypertensive urgency   Chronic anemia   Pressure injury of skin  1-Acute hypoxic respiratory failure; pulmonary edema.  Received HD 3-25 and again 3-26. Appreciate nephrology.  She will nee home oxygen.  ECHO; normal EF, diastolic dysfunction.   Mild elevation of troponin; in setting renal failure, pulmonary edema  2-ESRD; on HD; T, Th, S Has had HD Monday and Tuesday.   COPD; continue with nebulizer.   DM type 2; hypoglycemia;  SSI.  Resume low dose lantus.   Anemia; follow hb.   HTN; continue with Nifedipine, lisinopril.  SVT; mg normal, K normal. Start low dose metoprolol. Observed overnight.    DVT prophylaxis: Heparin Code Status: Full code Family Communication: no family at bedside.  Disposition Plan: home when respiratory status improved.   Consultants:   Nephrology    Procedures:   ECHO pending   Antimicrobials:     Subjective: She is feeling cold this am. Denies chest pain and dyspnea.     Objective: Vitals:   06/08/17 2110 06/09/17 0428 06/09/17 0500 06/09/17 0832  BP: (!) 176/74 125/67  (!) 158/64  Pulse: 88 76  86  Resp: 20     Temp: 99.3 F (37.4 C) 98.3 F (36.8 C)    TempSrc: Oral Oral    SpO2: 100% 100%  96%  Weight:   55.3 kg (121 lb 14.6 oz)   Height:        Intake/Output Summary (Last 24 hours) at 06/09/2017 1339 Last data filed at 06/09/2017 1300 Gross per 24 hour  Intake 1320 ml  Output 200 ml  Net 1120 ml   Filed Weights   06/08/17 0810 06/08/17 1217 06/09/17 0500  Weight: 54.9 kg (121 lb 0.5 oz) 52.4 kg (115 lb 8.3 oz) 55.3 kg (121 lb 14.6 oz)    Examination:  General exam: NAD Respiratory system:  Respiratory effort normal, CTA Cardiovascular system: S 1, S 2 RRR Gastrointestinal system: BS presents, soft, nt Central nervous system: alert and oriented.  Extremities: Symmetric power.  Skin: no rash.  Psychiatry: mood appropriate.     Data Reviewed: I have personally reviewed following labs and imaging studies  CBC: Recent Labs  Lab 06/07/17 1000 06/08/17 0233 06/09/17 0902  WBC 9.8 8.4 9.2  NEUTROABS 8.1*  --   --   HGB 11.7* 11.1* 12.8  HCT 38.0 36.3 40.9  MCV 91.8 89.4 89.3  PLT 206 151 180   Basic Metabolic Panel: Recent Labs  Lab 06/07/17 1000 06/08/17 0233 06/09/17 0902  NA 141 136 136  K 3.6 3.7 4.2  CL 101 98* 99*  CO2 26 27 26   GLUCOSE 103* 180* 143*  BUN 27* 11 8  CREATININE 6.04* 3.54* 3.44*  CALCIUM 8.6* 8.2* 9.0  MG  --   --  2.0  PHOS  --  2.3*  --    GFR: Estimated Creatinine Clearance: 10.3 mL/min (A) (by C-G formula based on SCr of 3.44 mg/dL (H)). Liver Function Tests: Recent Labs  Lab 06/07/17 1000 06/08/17 0233  AST 36  --   ALT 22  --   ALKPHOS 113  --   BILITOT 0.6  --   PROT 7.6  --   ALBUMIN 3.3* 2.8*   No results for input(s): LIPASE, AMYLASE in the last 168 hours. No results for input(s): AMMONIA in the last 168 hours. Coagulation Profile: No results for input(s): INR,  PROTIME in the last 168 hours. Cardiac Enzymes: Recent Labs  Lab 06/07/17 1000 06/08/17 0734 06/08/17 1302  TROPONINI 0.05* 0.06* 0.07*   BNP (last 3 results) No results for input(s): PROBNP in the last 8760 hours. HbA1C: No results for input(s): HGBA1C in the last 72 hours. CBG: Recent Labs  Lab 06/08/17 1709 06/08/17 2111 06/09/17 0636 06/09/17 0800 06/09/17 1224  GLUCAP 129* 162* 123* 114* 167*   Lipid Profile: No results for input(s): CHOL, HDL, LDLCALC, TRIG, CHOLHDL, LDLDIRECT in the last 72 hours. Thyroid Function Tests: No results for input(s): TSH, T4TOTAL, FREET4, T3FREE, THYROIDAB in the last 72 hours. Anemia Panel: No results for input(s): VITAMINB12, FOLATE, FERRITIN, TIBC, IRON, RETICCTPCT in the last 72 hours. Sepsis Labs: No results for input(s): PROCALCITON, LATICACIDVEN in the last 168 hours.  Recent Results (from the past 240 hour(s))  MRSA PCR Screening     Status: Abnormal   Collection Time: 06/08/17  2:40 AM  Result Value Ref Range Status   MRSA by PCR POSITIVE (A) NEGATIVE Final    Comment:        The GeneXpert MRSA Assay (FDA approved for NASAL specimens only), is one component of a comprehensive MRSA colonization surveillance program. It is not intended to diagnose MRSA infection nor to guide or monitor treatment for MRSA infections. RESULT CALLED TO, READ BACK BY AND VERIFIED WITH: Raphael Gibney RN, AT [phone] 06/08/17 BY Renato Shin Performed at [facility], [address] [place], [place] [address]          Radiology Studies: No results found.      Scheduled Meds: . calcium acetate  1,334 mg Oral TID WC   And  . calcium acetate  667 mg Oral With snacks  . Chlorhexidine Gluconate Cloth  6 each Topical Q0600  . feeding supplement (NEPRO CARB STEADY)  237 mL Oral BID BM  . fluticasone furoate-vilanterol  1 puff Inhalation Daily  . heparin  5,000 Units Subcutaneous Q8H  . insulin aspart  0-9 Units Subcutaneous TID WC  .  lisinopril  40 mg Oral QPM  . metoprolol tartrate  12.5 mg Oral BID  . mupirocin ointment  1 application Nasal BID  . NIFEdipine  60 mg Oral BID  . pravastatin  40 mg Oral Daily  . sodium chloride flush  3 mL Intravenous Q12H  . tiotropium  1 capsule Inhalation Daily   Continuous Infusions: . sodium chloride    . sodium chloride       LOS: 2 days    Time spent: 35 minutes.     Deanza Upperman A Jahmai Finelli,  MD [facility] Pager [phone]  If 7PM-7AM, please contact night-coverage [email or web address] Password [email or web address] 06/09/2017, 1:39 PM

## 2017-06-09 NOTE — Evaluation (Signed)
Physical Therapy Evaluation Patient Details Name: Nicole Cox MRN: [id number] DOB: [birth year] Today's Date: 06/09/2017   History of Present Illness  Nicole Cox is a 84 y.o. female with a history of ESRD, HTN, COPD, CAD, HTN, hx CVA who presented to the ED for sudden onset of dyspnea the morning of admission. She reported waking up this morning with severe constant shortness of breath and orthopnea. She initially had wheezing, improved with breathing treatment at home but symptoms persisted. She required CPAP by EMS and was found to be in respiratory distress improved with supplemental oxygen. CXR demonstrated pulmonary edema, BNP 1685, and troponin below baseline at 0.05.   Clinical Impression  Pt admitted with above diagnosis. Pt currently with functional limitations due to the deficits listed below (see PT Problem List). Pt was able to ambulate with RW a short distance around the bed.  Pt states she did not want to walk further.  Did well overall with no significant LOB with RW.  Pt did desat on RA with activity.  Pt will benefit from skilled PT to increase their independence and safety with mobility to allow discharge to the venue listed below.    SATURATION QUALIFICATIONS: (This note is used to comply with regulatory documentation for home oxygen)  Patient Saturations on Room Air at Rest = 94%  Patient Saturations on Room Air while Ambulating = 86%  Patient Saturations on 2 Liters of oxygen while Ambulating = 94%  Please briefly explain why patient needs home oxygen:Pt requires O2 with activity as she desats unless O2 in place.  Encouraged incentive spirometer.  Pt able to return demonstration.  Follow Up Recommendations Home health PT;Supervision/Assistance - 24 hour    Equipment Recommendations  None recommended by PT    Recommendations for Other Services       Precautions / Restrictions Precautions Precautions: Fall Restrictions Weight Bearing Restrictions: No       Mobility  Bed Mobility Overal bed mobility: Needs Assistance Bed Mobility: Supine to Sit     Supine to sit: Min guard     General bed mobility comments: incr time but came to EOB without assist  Transfers Overall transfer level: Needs assistance Equipment used: Rolling walker (2 wheeled) Transfers: Sit to/from Stand Sit to Stand: Min assist         General transfer comment: steadying assist upon rising to RW.   Ambulation/Gait Ambulation/Gait assistance: Min assist;Min guard Ambulation Distance (Feet): 30 Feet Assistive device: Rolling walker (2 wheeled) Gait Pattern/deviations: Step-through pattern;Decreased stride length;Trunk flexed;Drifts right/left   Gait velocity interpretation: Below normal speed for age/gender General Gait Details: Pt would only walk around the bed to the chair.  Pt with slightly flexed posture with RW.  Cues for safety awareness  Stairs            Wheelchair Mobility    Modified Rankin (Stroke Patients Only)       Balance Overall balance assessment: Needs assistance Sitting-balance support: No upper extremity supported;Feet supported Sitting balance-Leahy Scale: Fair     Standing balance support: Bilateral upper extremity supported;During functional activity Standing balance-Leahy Scale: Poor Standing balance comment: relies on RW for support                             Pertinent Vitals/Pain Pain Assessment: No/denies pain    Home Living Family/patient expects to be discharged to:: Private residence Living Arrangements: Children;Spouse/significant other Available Help at Discharge: Family;Available 24 hours/day Type of  Home: House Home Access: Stairs to enter Entrance Stairs-Rails: Right;Left;Can reach both Entrance Stairs-Number of Steps: 4 Home Layout: Multi-level;Able to live on main level with bedroom/bathroom Home Equipment: Walker - 4 wheels      Prior Function Level of Independence: Independent  with assistive device(s)   Gait / Transfers Assistance Needed: used RW for gait  ADL's / Homemaking Assistance Needed: Pt got down in tub with assist, daughter assists with bathing, meal prep and housekeeping        Hand Dominance   Dominant Hand: Right    Extremity/Trunk Assessment   Upper Extremity Assessment Upper Extremity Assessment: Defer to OT evaluation    Lower Extremity Assessment Lower Extremity Assessment: Generalized weakness    Cervical / Trunk Assessment Cervical / Trunk Assessment: Normal  Communication   Communication: No difficulties  Cognition Arousal/Alertness: Awake/alert Behavior During Therapy: WFL for tasks assessed/performed Overall Cognitive Status: Within Functional Limits for tasks assessed                                        General Comments      Exercises     Assessment/Plan    PT Assessment Patient needs continued PT services  PT Problem List Decreased balance;Decreased activity tolerance;Decreased mobility;Cardiopulmonary status limiting activity;Decreased knowledge of precautions;Decreased safety awareness;Decreased knowledge of use of DME       PT Treatment Interventions DME instruction;Gait training;Functional mobility training;Therapeutic activities;Therapeutic exercise;Stair training;Balance training;Patient/family education    PT Goals (Current goals can be found in the Care Plan section)  Acute Rehab PT Goals Patient Stated Goal: to get better PT Goal Formulation: With patient Time For Goal Achievement: 06/23/17 Potential to Achieve Goals: Good    Frequency Min 3X/week   Barriers to discharge        Co-evaluation               AM-PAC PT "6 Clicks" Daily Activity  Outcome Measure Difficulty turning over in bed (including adjusting bedclothes, sheets and blankets)?: None Difficulty moving from lying on back to sitting on the side of the bed? : A Little Difficulty sitting down on and  standing up from a chair with arms (e.g., wheelchair, bedside commode, etc,.)?: A Little Help needed moving to and from a bed to chair (including a wheelchair)?: A Little Help needed walking in hospital room?: A Little Help needed climbing 3-5 steps with a railing? : A Lot 6 Click Score: 18    End of Session Equipment Utilized During Treatment: Gait belt;Oxygen Activity Tolerance: Patient limited by fatigue Patient left: in chair;with call bell/phone within reach;with chair alarm set Nurse Communication: Mobility status PT Visit Diagnosis: Unsteadiness on feet (R26.81);Muscle weakness (generalized) (M62.81)    Time: [id number] PT Time Calculation (min) (ACUTE ONLY): 19 min   Charges:   PT Evaluation $PT Eval Moderate Complexity: 1 Mod     PT G Codes:        Saad Buhl,PT Acute Rehabilitation [phone] (pager)   Berline Lopesawn F Aftin Lye 06/09/2017, 11:06 AM

## 2017-06-10 DIAGNOSIS — E440 Moderate protein-calorie malnutrition: Secondary | ICD-10-CM

## 2017-06-10 LAB — RENAL FUNCTION PANEL
Albumin: 2.8 g/dL — ABNORMAL LOW (ref 3.5–5.0)
Anion gap: 11 (ref 5–15)
BUN: 21 mg/dL — AB (ref 6–20)
CALCIUM: 8.6 mg/dL — AB (ref 8.9–10.3)
CHLORIDE: 97 mmol/L — AB (ref 101–111)
CO2: 28 mmol/L (ref 22–32)
CREATININE: 4.91 mg/dL — AB (ref 0.44–1.00)
GFR calc Af Amer: 9 mL/min — ABNORMAL LOW (ref >60)
GFR calc non Af Amer: 7 mL/min — ABNORMAL LOW (ref >60)
GLUCOSE: 101 mg/dL — AB (ref 65–99)
Phosphorus: 2.2 mg/dL — ABNORMAL LOW (ref 2.5–4.6)
Potassium: 4.2 mmol/L (ref 3.5–5.1)
SODIUM: 136 mmol/L (ref 135–145)

## 2017-06-10 LAB — GLUCOSE, CAPILLARY
GLUCOSE-CAPILLARY: 118 mg/dL — AB (ref 65–99)
GLUCOSE-CAPILLARY: 129 mg/dL — AB (ref 65–99)
GLUCOSE-CAPILLARY: 157 mg/dL — AB (ref 65–99)

## 2017-06-10 LAB — HEPATITIS B SURFACE ANTIGEN: Hepatitis B Surface Ag: NEGATIVE

## 2017-06-10 MED ORDER — METOPROLOL TARTRATE 25 MG PO TABS: 12.5000 mg | ORAL_TABLET | Freq: Two times a day (BID) | ORAL | 0 refills | Status: AC

## 2017-06-10 MED ORDER — CALCIUM ACETATE (PHOS BINDER) 667 MG PO CAPS: 667.0000 mg | ORAL_CAPSULE | ORAL | Status: DC

## 2017-06-10 MED ORDER — CALCIUM ACETATE (PHOS BINDER) 667 MG PO CAPS: 667.0000 mg | ORAL_CAPSULE | Freq: Three times a day (TID) | ORAL | Status: DC

## 2017-06-10 MED ORDER — DIPHENHYDRAMINE HCL 25 MG PO CAPS
ORAL_CAPSULE | ORAL | Status: AC
  Filled 2017-06-10: qty 1

## 2017-06-10 NOTE — Care Management Note (Signed)
Case Management Note  Patient Details  Name: Nicole Cox MRN: [id number] Date of Birth: 01/30/1932  Subjective/Objective:    Pt admitted with HCAP. She is from home with her daughter, [name]. Per patient she has oxygen at home but no transport tanks.              Action/Plan: [facility] CM provided her choice and she selected [facility]. [facility] and [facility] the [facility].  CM called [name] and she states that the patient does have portable oxygen tanks at home that have to be refilled by the machine they have at home. CM notified her that [facility] and have [facility] provide a portable tank for the patients trip home. CM [facility] and notified her that a portable tank would be needed for the transport home. She will deliver oxygen to the room.  [name] will provide transportation home. CM asked bedside RN to call daughter when patient is ready for d/c.    Expected Discharge Date:  06/10/17               Expected Discharge Plan:  Home w Home Health Services  In-House Referral:     Discharge planning Services  CM Consult  Post Acute Care Choice:  Durable Medical Equipment, Home Health Choice offered to:  Patient, Adult Children  DME Arranged:  Oxygen(portable tank to transport home) DME Agency:  [facility]  HH Arranged:  RN, PT, Nurse's Aide, OT [facility] Agency:  [facility]  Status of Service:  Completed, signed off  If discussed at Long Length of Stay Meetings, dates discussed:    Additional Comments:  Kermit BaloKelli F Lenee Franze, RN 06/10/2017, 4:40 PM

## 2017-06-10 NOTE — Progress Notes (Signed)
Per Dr Andrez Grimeegalodo  Patients prescriptions were called in to [facility] on [address] [place]   Metoprolol 25 MG  0.5 tablets 2 times daily  Nutritional Supplements  237 by mouth 2 times a day  Lantus 5units at bedtime subQ  Phoslo 1334 mg three times a day with meals  Phoslo 667mg  by mouth with snacks

## 2017-06-10 NOTE — Progress Notes (Signed)
  [facility] Progress Note   Assessment/ Plan:   1 Acute hypoxic RF: appears to be pulm edema as evidenced on CXR.  Don't suspect pneumonia at this point- no infectious symptoms.  Emergent HD 3/25, on schedule 3/26. Pt reports EDW at 127 lbs and she will need a lower EDW on d/c.  Plan for HD 3/28 today before d/c   2 ESRD:TTS at [facility]- records pending, have been requested and have not been sent yet despite multiple requests 3 Hypertension: expect to improve with UF, on home lisionpril, procardia, and metop added yesterday 4. Anemia of ESRD: Will get records and dose iron and ESA as appropriate- hgb 12.8, doesn't need ESA now 5. Metabolic Bone Disease: VDRA and binders as appropriate- decreasing phoslo to 1 AC TID and 1 with snacks as phos 2.1 6.  SVT: metop as above 7.  Dispo: Ok from my perspective to d/c after HD today.   Subjective:    No acute events overnight.  Pt still eager to go.     Objective:   BP (!) 170/58 (BP Location: Right Wrist)   Pulse 70   Temp 98.8 F (37.1 C) (Oral)   Resp 18   Ht 5\' 3"  (1.6 m)   Wt 55.3 kg (121 lb 14.6 oz)   SpO2 98%   BMI 21.60 kg/m   Physical Exam: GEN sitting in bed, off [place] O2 HEENT EOMI, PERRL, anicteric NECK no JVD PULM normal WOB CV tachycardic, soft systolic murmur ABD soft, nontender EXT no LLE today NEURO AAO x 3 SKIN no rashes ACCESS: LUE AVF + T/B, some thinned areas    Labs: BMET Recent Labs  Lab 06/07/17 1000 06/08/17 0233 06/09/17 0902 06/10/17 0307  NA 141 136 136 136  K 3.6 3.7 4.2 4.2  CL 101 98* 99* 97*  CO2 26 27 26 28   GLUCOSE 103* 180* 143* 101*  BUN 27* 11 8 21*  CREATININE 6.04* 3.54* 3.44* 4.91*  CALCIUM 8.6* 8.2* 9.0 8.6*  PHOS  --  2.3*  --  2.2*   CBC Recent Labs  Lab 06/07/17 1000 06/08/17 0233 06/09/17 0902  WBC 9.8 8.4 9.2  NEUTROABS 8.1*  --   --   HGB 11.7* 11.1* 12.8  HCT 38.0 36.3 40.9  MCV 91.8 89.4 89.3  PLT 206 151 180    @IMGRELPRIORS @ Medications:     . calcium acetate  1,334 mg Oral TID WC   And  . calcium acetate  667 mg Oral With snacks  . Chlorhexidine Gluconate Cloth  6 each Topical Q0600  . feeding supplement (NEPRO CARB STEADY)  237 mL Oral BID BM  . fluticasone furoate-vilanterol  1 puff Inhalation Daily  . heparin  5,000 Units Subcutaneous Q8H  . insulin aspart  0-9 Units Subcutaneous TID WC  . insulin glargine  5 Units Subcutaneous QHS  . lisinopril  40 mg Oral QPM  . metoprolol tartrate  12.5 mg Oral BID  . mupirocin ointment  1 application Nasal BID  . NIFEdipine  60 mg Oral BID  . pravastatin  40 mg Oral Daily  . sodium chloride flush  3 mL Intravenous Q12H  . tiotropium  1 capsule Inhalation Daily     Bufford ButtnerElizabeth Hilda Wexler MD [facility] pgr [phone] 06/10/2017, 9:41 AM

## 2017-06-10 NOTE — Discharge Summary (Addendum)
Physician Discharge Summary  Nicole Cox ZOX:[id number] DOB: 01/30/1932 DOA: 06/07/2017  PCP: Joana Reamer, MD  Admit date: 06/07/2017 Discharge date: 06/10/2017  Admitted From: Home  Disposition: Home  Recommendations for Outpatient Follow-up:  1. Follow up with PCP in 1-2 weeks 2. Please obtain BMP/CBC in one week 3. Follow with PCP for further adjustment of medications.   Home Health: yes.  Equipment/Devices: yes.   Discharge Condition: Stable.  CODE STATUS: Full code.  Diet recommendation: Heart Healthy  Brief/Interim Summary: Brief Narrative by Jarvis NewcomerSonda Rumble a 84 y.o.femalewitha history of ESRD, HTN, COPD, CAD, HTN, hx CVA who presented to the ED for sudden onset of dyspnea the morning of admission. She reported waking up this morning with severe constant shortness of breath and orthopnea. She initially had wheezing, improved with breathing treatment at home but symptoms persisted. She required CPAP by EMS and was found to be in respiratory distress improved with supplemental oxygen. CXR demonstrated pulmonary edema, BNP 1685, and troponin below baseline at 0.05.  Assessment & Plan:   Principal Problem:   Acute pulmonary edema (HCC) Active Problems:   ESRD (end stage renal disease) (HCC)   Hypertension   Diabetes mellitus (HCC)   COPD (chronic obstructive pulmonary disease) (HCC)   CAD (coronary artery disease)   Acute respiratory failure with hypoxia (HCC)   Hypertensive urgency   Chronic anemia   Pressure injury of skin  1-Acute hypoxic respiratory failure; pulmonary edema non cardiogenic related to ESRD. Marland Kitchen  Received HD 3-25 and again 3-26. Appreciate nephrology.  She will need home oxygen.  ECHO; normal EF, diastolic dysfunction.   Pressure injury, unstageable, buttocks. No open, old pressure ulcer appears to have healed.  Local care.  Present prior to admission.   Mild elevation of troponin; in setting renal failure, pulmonary  edema  2-ESRD; on HD; T, Th, S Has had HD Monday and Tuesday.  plan to discharge today after HD.  COPD; Continue with nebulizer.   DM type 2; hypoglycemia;  No further hypoglycemia, discharge on low dose lantus.   Anemia; Hb stable.   HTN; Continue with Nifedipine, lisinopril.   SVT; mg normal, K normal. Started low dose metoprolol. Observed overnight. No further episodes today. Patient denies chest pain, ECHO normal EF and no wall motion abnormalities.     Discharge Diagnoses:  Principal Problem:   Acute pulmonary edema (HCC) Active Problems:   ESRD (end stage renal disease) (HCC)   Hypertension   Diabetes mellitus (HCC)   COPD (chronic obstructive pulmonary disease) (HCC)   CAD (coronary artery disease)   Acute respiratory failure with hypoxia (HCC)   Hypertensive urgency   Chronic anemia   Pressure injury of skin   Malnutrition of moderate degree    Discharge Instructions  Discharge Instructions    Diet - low sodium heart healthy   Complete by:  As directed    Increase activity slowly   Complete by:  As directed      Allergies as of 06/10/2017   No Known Allergies     Medication List    STOP taking these medications   acetaminophen 500 MG tablet Commonly known as:  TYLENOL   cloNIDine 0.2 mg/24hr patch Commonly known as:  CATAPRES - Dosed in mg/24 hr   cloNIDine 0.3 mg/24hr patch Commonly known as:  CATAPRES - Dosed in mg/24 hr   insulin aspart 100 UNIT/ML injection Commonly known as:  NOVOLOG   ipratropium-albuterol 0.5-2.5 (3) MG/3ML Soln Commonly known as:  DUONEB  predniSONE 10 MG (21) Tbpk tablet Commonly known as:  STERAPRED UNI-PAK 21 TAB     TAKE these medications   albuterol 108 (90 Base) MCG/ACT inhaler Commonly known as:  PROVENTIL HFA;VENTOLIN HFA Inhale 2 puffs into the lungs every 6 (six) hours as needed for wheezing or shortness of breath. What changed:  Another medication with the same name was removed. Continue  taking this medication, and follow the directions you see here.   calcium acetate 667 MG capsule Commonly known as:  PHOSLO Take 2 capsules (1,334 mg total) by mouth 3 (three) times daily with meals. What changed:    how much to take  when to take this  additional instructions   calcium acetate 667 MG capsule Commonly known as:  PHOSLO Take 1 capsule (667 mg total) by mouth with snacks. What changed:  You were already taking a medication with the same name, and this prescription was added. Make sure you understand how and when to take each.   feeding supplement (NEPRO CARB STEADY) Liqd Take 237 mLs by mouth 2 (two) times daily between meals.   fluticasone furoate-vilanterol 100-25 MCG/INH Aepb Commonly known as:  BREO ELLIPTA Inhale 1 puff into the lungs daily.   insulin glargine 100 UNIT/ML injection Commonly known as:  LANTUS Inject 0.05 mLs (5 Units total) into the skin at bedtime. What changed:  how much to take   isosorbide mononitrate 30 MG 24 hr tablet Commonly known as:  IMDUR Take 30 mg by mouth every evening.   lidocaine-prilocaine cream Commonly known as:  EMLA Apply 1 application topically every other day.   lisinopril 40 MG tablet Commonly known as:  PRINIVIL,ZESTRIL Take 40 mg by mouth every evening.   metoprolol tartrate 25 MG tablet Commonly known as:  LOPRESSOR Take 0.5 tablets (12.5 mg total) by mouth 2 (two) times daily.   NIFEdipine 60 MG 24 hr tablet Commonly known as:  PROCARDIA XL/ADALAT-CC Take 60 mg by mouth 2 (two) times daily.   pravastatin 40 MG tablet Commonly known as:  PRAVACHOL Take 40 mg by mouth daily.   SPIRIVA HANDIHALER 18 MCG inhalation capsule Generic drug:  tiotropium Place 1 capsule into inhaler and inhale daily.   traMADol 50 MG tablet Commonly known as:  ULTRAM Take 50 mg by mouth every 8 (eight) hours as needed for pain.   Vitamin D3 1000 units Caps Take 2,000 Units by mouth every other day.             Durable Medical Equipment  (From admission, onward)        Start     Ordered   06/10/17 1526  For home use only DME oxygen  Once    Question Answer Comment  Mode or (Route) Nasal cannula   Liters per Minute 2   Frequency Continuous (stationary and portable oxygen unit needed)   Oxygen delivery system Gas      06/10/17 1525      No Known Allergies  Consultations:  Nephrology    Procedures/Studies: Dg Chest 2 View  Result Date: 06/07/2017 CLINICAL DATA:  Cough and shortness of Breath EXAM: CHEST - 2 VIEW COMPARISON:  10/30/2016 FINDINGS: Cardiac shadow is stable. Aortic calcifications are again seen. Lungs are well aerated bilaterally. Chronic pleural calcifications are noted on the right inferiorly. Diffuse central vascular congestion and interstitial edema is noted. No focal confluent infiltrate is seen. IMPRESSION: Vascular congestion interstitial edema superimposed on chronic changes in the right base. Electronically Signed   By:  Alcide CleverMark  Lukens M.D.   On: 06/07/2017 09:30      Subjective: She denies chest pain or dyspnea. She would want to have heart cath. She is feeling better, she would like to go home after HD>   Discharge Exam: Vitals:   06/10/17 1530 06/10/17 1541  BP: 101/70 (!) 152/44  Pulse: 85 84  Resp:  (!) 21  Temp:  97.8 F (36.6 C)  SpO2:  91%   Vitals:   06/10/17 1430 06/10/17 1500 06/10/17 1530 06/10/17 1541  BP: (!) 87/45 (!) 94/58 101/70 (!) 152/44  Pulse: 79 78 85 84  Resp:    (!) 21  Temp:    97.8 F (36.6 C)  TempSrc:    Oral  SpO2:    91%  Weight:    55.3 kg (121 lb 14.6 oz)  Height:        General: Pt is alert, awake, not in acute distress Cardiovascular: RRR, S1/S2 +, no rubs, no gallops Respiratory: CTA bilaterally, no wheezing, no rhonchi Abdominal: Soft, NT, ND, bowel sounds + Extremities: no edema, no cyanosis    The results of significant diagnostics from this hospitalization (including imaging, microbiology, ancillary  and laboratory) are listed below for reference.     Microbiology: Recent Results (from the past 240 hour(s))  MRSA PCR Screening     Status: Abnormal   Collection Time: 06/08/17  2:40 AM  Result Value Ref Range Status   MRSA by PCR POSITIVE (A) NEGATIVE Final    Comment:        The GeneXpert MRSA Assay (FDA approved for NASAL specimens only), is one component of a comprehensive MRSA colonization surveillance program. It is not intended to diagnose MRSA infection nor to guide or monitor treatment for MRSA infections. RESULT CALLED TO, READ BACK BY AND VERIFIED WITH: Raphael Gibney. GLOMMEN RN, AT [phone] 06/08/17 BY Renato Shin. VANHOOK Performed at [facility], [address] [place], [place] [address]      Labs: BNP (last 3 results) Recent Labs    06/07/17 1000  BNP 1,685.3*   Basic Metabolic Panel: Recent Labs  Lab 06/07/17 1000 06/08/17 0233 06/09/17 0902 06/10/17 0307  NA 141 136 136 136  K 3.6 3.7 4.2 4.2  CL 101 98* 99* 97*  CO2 26 27 26 28   GLUCOSE 103* 180* 143* 101*  BUN 27* 11 8 21*  CREATININE 6.04* 3.54* 3.44* 4.91*  CALCIUM 8.6* 8.2* 9.0 8.6*  MG  --   --  2.0  --   PHOS  --  2.3*  --  2.2*   Liver Function Tests: Recent Labs  Lab 06/07/17 1000 06/08/17 0233 06/10/17 0307  AST 36  --   --   ALT 22  --   --   ALKPHOS 113  --   --   BILITOT 0.6  --   --   PROT 7.6  --   --   ALBUMIN 3.3* 2.8* 2.8*   No results for input(s): LIPASE, AMYLASE in the last 168 hours. No results for input(s): AMMONIA in the last 168 hours. CBC: Recent Labs  Lab 06/07/17 1000 06/08/17 0233 06/09/17 0902  WBC 9.8 8.4 9.2  NEUTROABS 8.1*  --   --   HGB 11.7* 11.1* 12.8  HCT 38.0 36.3 40.9  MCV 91.8 89.4 89.3  PLT 206 151 180   Cardiac Enzymes: Recent Labs  Lab 06/07/17 1000 06/08/17 0734 06/08/17 1302  TROPONINI 0.05* 0.06* 0.07*   BNP: Invalid input(s): POCBNP  CBG: Recent Labs  Lab 06/09/17 1224 06/09/17 1618 06/09/17 2053 06/10/17 0617 06/10/17 0759   GLUCAP 167* 74 199* 118* 129*   D-Dimer No results for input(s): DDIMER in the last 72 hours. Hgb A1c No results for input(s): HGBA1C in the last 72 hours. Lipid Profile No results for input(s): CHOL, HDL, LDLCALC, TRIG, CHOLHDL, LDLDIRECT in the last 72 hours. Thyroid function studies No results for input(s): TSH, T4TOTAL, T3FREE, THYROIDAB in the last 72 hours.  Invalid input(s): FREET3 Anemia work up No results for input(s): VITAMINB12, FOLATE, FERRITIN, TIBC, IRON, RETICCTPCT in the last 72 hours. Urinalysis    Component Value Date/Time   COLORURINE YELLOW 08/11/2012 1917   APPEARANCEUR CLOUDY (A) 08/11/2012 1917   LABSPEC 1.013 08/11/2012 1917   PHURINE 7.5 08/11/2012 1917   GLUCOSEU 100 (A) 08/11/2012 1917   HGBUR TRACE (A) 08/11/2012 1917   BILIRUBINUR NEGATIVE 08/11/2012 1917   KETONESUR NEGATIVE 08/11/2012 1917   PROTEINUR >300 (A) 08/11/2012 1917   UROBILINOGEN 0.2 08/11/2012 1917   NITRITE NEGATIVE 08/11/2012 1917   LEUKOCYTESUR NEGATIVE 08/11/2012 1917   Sepsis Labs Invalid input(s): PROCALCITONIN,  WBC,  LACTICIDVEN Microbiology Recent Results (from the past 240 hour(s))  MRSA PCR Screening     Status: Abnormal   Collection Time: 06/08/17  2:40 AM  Result Value Ref Range Status   MRSA by PCR POSITIVE (A) NEGATIVE Final    Comment:        The GeneXpert MRSA Assay (FDA approved for NASAL specimens only), is one component of a comprehensive MRSA colonization surveillance program. It is not intended to diagnose MRSA infection nor to guide or monitor treatment for MRSA infections. RESULT CALLED TO, READ BACK BY AND VERIFIED WITH: Raphael Gibney RN, AT [phone] 06/08/17 BY Renato Shin Performed at [facility], [address], [place], [place] [address]      Time coordinating discharge: Over 30 minutes  SIGNED:   Alba Cory, MD  [facility] 06/10/2017, 4:49 PM Pager [phone]  If 7PM-7AM, please contact  night-coverage [email or web address] Password [email or web address]

## 2017-06-11 LAB — HEPATITIS B SURFACE ANTIBODY,QUALITATIVE: HEP B S AB: REACTIVE

## 2017-06-11 LAB — HEPATITIS B CORE ANTIBODY, TOTAL: HEP B C TOTAL AB: NEGATIVE

## 2017-07-27 ENCOUNTER — Other Ambulatory Visit: Payer: Self-pay

## 2017-07-27 ENCOUNTER — Emergency Department (HOSPITAL_COMMUNITY): Payer: Medicare HMO

## 2017-07-27 ENCOUNTER — Inpatient Hospital Stay (HOSPITAL_COMMUNITY)
Admission: EM | Admit: 2017-07-27 | Discharge: 2017-08-04 | DRG: 871 | Disposition: A | Discharge status: Skilled Nursing Facility | Payer: Medicare HMO | Attending: Internal Medicine | Admitting: Internal Medicine

## 2017-07-27 ENCOUNTER — Observation Stay (HOSPITAL_COMMUNITY): Payer: Medicare HMO

## 2017-07-27 ENCOUNTER — Encounter (HOSPITAL_COMMUNITY): Payer: Self-pay

## 2017-07-27 DIAGNOSIS — Z7982 Long term (current) use of aspirin: Secondary | ICD-10-CM

## 2017-07-27 DIAGNOSIS — E1122 Type 2 diabetes mellitus with diabetic chronic kidney disease: Secondary | ICD-10-CM | POA: Diagnosis present

## 2017-07-27 DIAGNOSIS — R6510 Systemic inflammatory response syndrome (SIRS) of non-infectious origin without acute organ dysfunction: Secondary | ICD-10-CM | POA: Diagnosis present

## 2017-07-27 DIAGNOSIS — Z841 Family history of disorders of kidney and ureter: Secondary | ICD-10-CM

## 2017-07-27 DIAGNOSIS — Z90710 Acquired absence of both cervix and uterus: Secondary | ICD-10-CM

## 2017-07-27 DIAGNOSIS — Z992 Dependence on renal dialysis: Secondary | ICD-10-CM

## 2017-07-27 DIAGNOSIS — N2581 Secondary hyperparathyroidism of renal origin: Secondary | ICD-10-CM | POA: Diagnosis present

## 2017-07-27 DIAGNOSIS — N186 End stage renal disease: Secondary | ICD-10-CM

## 2017-07-27 DIAGNOSIS — I959 Hypotension, unspecified: Secondary | ICD-10-CM | POA: Diagnosis present

## 2017-07-27 DIAGNOSIS — Z823 Family history of stroke: Secondary | ICD-10-CM

## 2017-07-27 DIAGNOSIS — E1129 Type 2 diabetes mellitus with other diabetic kidney complication: Secondary | ICD-10-CM

## 2017-07-27 DIAGNOSIS — R7881 Bacteremia: Secondary | ICD-10-CM

## 2017-07-27 DIAGNOSIS — Z8673 Personal history of transient ischemic attack (TIA), and cerebral infarction without residual deficits: Secondary | ICD-10-CM

## 2017-07-27 DIAGNOSIS — Z833 Family history of diabetes mellitus: Secondary | ICD-10-CM

## 2017-07-27 DIAGNOSIS — Z87891 Personal history of nicotine dependence: Secondary | ICD-10-CM

## 2017-07-27 DIAGNOSIS — Z79899 Other long term (current) drug therapy: Secondary | ICD-10-CM

## 2017-07-27 DIAGNOSIS — R531 Weakness: Secondary | ICD-10-CM

## 2017-07-27 DIAGNOSIS — A4181 Sepsis due to Enterococcus: Principal | ICD-10-CM | POA: Diagnosis present

## 2017-07-27 DIAGNOSIS — I2510 Atherosclerotic heart disease of native coronary artery without angina pectoris: Secondary | ICD-10-CM | POA: Diagnosis present

## 2017-07-27 DIAGNOSIS — Z9089 Acquired absence of other organs: Secondary | ICD-10-CM

## 2017-07-27 DIAGNOSIS — I132 Hypertensive heart and chronic kidney disease with heart failure and with stage 5 chronic kidney disease, or end stage renal disease: Secondary | ICD-10-CM | POA: Diagnosis present

## 2017-07-27 DIAGNOSIS — D649 Anemia, unspecified: Secondary | ICD-10-CM | POA: Diagnosis present

## 2017-07-27 DIAGNOSIS — Z8249 Family history of ischemic heart disease and other diseases of the circulatory system: Secondary | ICD-10-CM

## 2017-07-27 DIAGNOSIS — I071 Rheumatic tricuspid insufficiency: Secondary | ICD-10-CM | POA: Diagnosis present

## 2017-07-27 DIAGNOSIS — Z794 Long term (current) use of insulin: Secondary | ICD-10-CM

## 2017-07-27 DIAGNOSIS — E11649 Type 2 diabetes mellitus with hypoglycemia without coma: Secondary | ICD-10-CM | POA: Diagnosis present

## 2017-07-27 DIAGNOSIS — I509 Heart failure, unspecified: Secondary | ICD-10-CM | POA: Diagnosis present

## 2017-07-27 DIAGNOSIS — J449 Chronic obstructive pulmonary disease, unspecified: Secondary | ICD-10-CM | POA: Diagnosis present

## 2017-07-27 DIAGNOSIS — D631 Anemia in chronic kidney disease: Secondary | ICD-10-CM | POA: Diagnosis present

## 2017-07-27 DIAGNOSIS — I10 Essential (primary) hypertension: Secondary | ICD-10-CM | POA: Diagnosis present

## 2017-07-27 DIAGNOSIS — Z9049 Acquired absence of other specified parts of digestive tract: Secondary | ICD-10-CM

## 2017-07-27 LAB — COMPREHENSIVE METABOLIC PANEL
ALT: 39 U/L (ref 14–54)
ANION GAP: 13 (ref 5–15)
AST: 47 U/L — ABNORMAL HIGH (ref 15–41)
Albumin: 2.9 g/dL — ABNORMAL LOW (ref 3.5–5.0)
Alkaline Phosphatase: 124 U/L (ref 38–126)
BUN: 18 mg/dL (ref 6–20)
CHLORIDE: 101 mmol/L (ref 101–111)
CO2: 25 mmol/L (ref 22–32)
CREATININE: 4.88 mg/dL — AB (ref 0.44–1.00)
Calcium: 8.3 mg/dL — ABNORMAL LOW (ref 8.9–10.3)
GFR, EST AFRICAN AMERICAN: 9 mL/min — AB (ref >60)
GFR, EST NON AFRICAN AMERICAN: 7 mL/min — AB (ref >60)
Glucose, Bld: 146 mg/dL — ABNORMAL HIGH (ref 65–99)
Potassium: 4.1 mmol/L (ref 3.5–5.1)
Sodium: 139 mmol/L (ref 135–145)
Total Bilirubin: 0.9 mg/dL (ref 0.3–1.2)
Total Protein: 7.5 g/dL (ref 6.5–8.1)

## 2017-07-27 LAB — CBC WITH DIFFERENTIAL/PLATELET
Abs Immature Granulocytes: 0 10*3/uL (ref 0.0–0.1)
BASOS ABS: 0 10*3/uL (ref 0.0–0.1)
BASOS PCT: 0 %
Eosinophils Absolute: 0.1 10*3/uL (ref 0.0–0.7)
Eosinophils Relative: 1 %
HCT: 38.3 % (ref 36.0–46.0)
Hemoglobin: 11.8 g/dL — ABNORMAL LOW (ref 12.0–15.0)
IMMATURE GRANULOCYTES: 0 %
Lymphocytes Relative: 6 %
Lymphs Abs: 0.7 10*3/uL (ref 0.7–4.0)
MCH: 28.2 pg (ref 26.0–34.0)
MCHC: 30.8 g/dL (ref 30.0–36.0)
MCV: 91.4 fL (ref 78.0–100.0)
Monocytes Absolute: 1 10*3/uL (ref 0.1–1.0)
Monocytes Relative: 9 %
NEUTROS ABS: 10.2 10*3/uL — AB (ref 1.7–7.7)
NEUTROS PCT: 84 %
PLATELETS: 277 10*3/uL (ref 150–400)
RBC: 4.19 MIL/uL (ref 3.87–5.11)
RDW: 19.6 % — ABNORMAL HIGH (ref 11.5–15.5)
WBC: 12 10*3/uL — ABNORMAL HIGH (ref 4.0–10.5)

## 2017-07-27 LAB — CBG MONITORING, ED: Glucose-Capillary: 257 mg/dL — ABNORMAL HIGH (ref 65–99)

## 2017-07-27 LAB — PROTIME-INR
INR: 1.09
Prothrombin Time: 14 s (ref 11.4–15.2)

## 2017-07-27 LAB — I-STAT TROPONIN, ED: Troponin i, poc: 0.05 ng/mL (ref 0.00–0.08)

## 2017-07-27 LAB — I-STAT CG4 LACTIC ACID, ED: Lactic Acid, Venous: 1.72 mmol/L (ref 0.5–1.9)

## 2017-07-27 MED ORDER — VANCOMYCIN HCL IN DEXTROSE 1-5 GM/200ML-% IV SOLN
1000.0000 mg | Freq: Once | INTRAVENOUS | Status: AC
  Administered 2017-07-27: 1000 mg via INTRAVENOUS
  Filled 2017-07-27: qty 200

## 2017-07-27 MED ORDER — ACETAMINOPHEN 325 MG PO TABS: 650.0000 mg | ORAL_TABLET | Freq: Once | ORAL | Status: DC

## 2017-07-27 MED ORDER — SODIUM CHLORIDE 0.9 % IV BOLUS
500.0000 mL | Freq: Once | INTRAVENOUS | Status: AC
  Administered 2017-07-27: 500 mL via INTRAVENOUS

## 2017-07-27 MED ORDER — SODIUM CHLORIDE 0.9 % IV SOLN
2.0000 g | Freq: Once | INTRAVENOUS | Status: AC
  Administered 2017-07-27: 2 g via INTRAVENOUS
  Filled 2017-07-27: qty 2

## 2017-07-27 NOTE — ED Triage Notes (Signed)
Patient from home for being weak since dialysis this afternoon.  Usually able to ambulate by herself after dialysis and today not able to.  Fever 13 orally for EMS  tylenol given by EMS PTA.  Patient is alert to self

## 2017-07-27 NOTE — ED Notes (Signed)
Patient does not make urine, MD aware

## 2017-07-27 NOTE — ED Provider Notes (Addendum)
[facility] EMERGENCY DEPARTMENT Provider Note   CSN: [id number] Arrival date & time: 07/27/17  1905     History   Chief Complaint Chief Complaint  Patient presents with  . Weakness    HPI Nicole Cox is a 84 y.o. female.  She presents to the emergency department complaining of no complaints.  When I asked her why she is here she states my family made me come in.  Per EMS the patient usually takes the bus to dialysis and is able to walk to the bus and walk home from the bus.  After dialysis today she was unable to get off the bus because she was so weak.  Family carried her back to the house and called 911 to bring her here.  The patient herself denies any complaints.  Specifically no chest pain no abdominal pain no headache no visual symptoms.  She said her appetite has not been very good.  She will endorse may be a mild cough.  The history is provided by the patient and the EMS personnel.  Weakness  Primary symptoms include no focal weakness, no speech change, no memory loss, no visual change, no auditory change. This is a new problem. The current episode started 1 to 2 hours ago. The problem has not changed since onset.There was no focality noted. The maximum temperature recorded prior to her arrival was 102 to 102.9 F. Fever duration: unknown. Pertinent negatives include no shortness of breath, no chest pain, no vomiting, no altered mental status, no confusion and no headaches. Meds prior to arrival: acetaminophen. Associated medical issues include CVA.    Past Medical History:  Diagnosis Date  . CHF (congestive heart failure) (HCC)   . COPD (chronic obstructive pulmonary disease) (HCC)   . Coronary artery disease   . Diabetes mellitus without complication (HCC)   . Heart disease   . Hypertension   . Renal disorder   . Stroke [facility])     Patient Active Problem List   Diagnosis Date Noted  . Malnutrition of moderate degree 06/10/2017  . Pressure injury of  skin 06/08/2017  . Acute pulmonary edema (HCC) 06/07/2017  . Respiratory failure with hypoxia (HCC) 12/12/2015  . Acute on chronic respiratory failure with hypoxia (HCC) 12/12/2015  . Fluid overload 12/12/2015  . Palliative care encounter   . Acute respiratory failure with hypoxia (HCC) 08/13/2015  . ESRD on hemodialysis (HCC) 08/13/2015  . Hypertensive urgency 08/13/2015  . Chronic anemia 08/13/2015  . Acute respiratory failure (HCC) 08/13/2015  . HCAP (healthcare-associated pneumonia) 08/11/2012  . Hypoxia 08/11/2012  . ESRD (end stage renal disease) (HCC) 08/11/2012  . Hypertension 08/11/2012  . Diabetes mellitus (HCC) 08/11/2012  . C1 cervical fracture (HCC) 08/11/2012  . Abnormal MRI, thoracic spine 08/11/2012  . COPD (chronic obstructive pulmonary disease) (HCC) 08/11/2012  . CAD (coronary artery disease) 08/11/2012  . Fall 08/11/2012    Past Surgical History:  Procedure Laterality Date  . ABDOMINAL HYSTERECTOMY    . CHOLECYSTECTOMY    . TONSILLECTOMY       OB History   None      Home Medications    Prior to Admission medications   Medication Sig Start Date End Date Taking? Authorizing Provider  albuterol (PROVENTIL HFA;VENTOLIN HFA) 108 (90 Base) MCG/ACT inhaler Inhale 2 puffs into the lungs every 6 (six) hours as needed for wheezing or shortness of breath. 08/17/15   Mikhail, Nita Sells, DO  calcium acetate (PHOSLO) 667 MG capsule Take 2 capsules (  1,334 mg total) by mouth 3 (three) times daily with meals. 06/09/17   Regalado, Belkys A, MD  calcium acetate (PHOSLO) 667 MG capsule Take 1 capsule (667 mg total) by mouth with snacks. 06/09/17   Regalado, Belkys A, MD  Cholecalciferol (VITAMIN D3) 1000 units CAPS Take 2,000 Units by mouth every other day.    [provider]  cloNIDine (CATAPRES - DOSED IN MG/24 HR) 0.3 mg/24hr patch Place 1 patch onto the skin every 7 (seven) days. 07/18/17   [provider]  fluticasone furoate-vilanterol (BREO ELLIPTA)  100-25 MCG/INH AEPB Inhale 1 puff into the lungs daily. 11/10/15   [provider]  insulin glargine (LANTUS) 100 UNIT/ML injection Inject 0.05 mLs (5 Units total) into the skin at bedtime. 06/09/17   Regalado, Belkys A, MD  isosorbide mononitrate (IMDUR) 30 MG 24 hr tablet Take 30 mg by mouth every evening. 03/21/17   [provider]  lidocaine-prilocaine (EMLA) cream Apply 1 application topically every other day. 10/21/15   [provider]  lisinopril (PRINIVIL,ZESTRIL) 40 MG tablet Take 40 mg by mouth every evening. 12/02/15   [provider]  metoprolol tartrate (LOPRESSOR) 25 MG tablet Take 0.5 tablets (12.5 mg total) by mouth 2 (two) times daily. 06/10/17   Regalado, Belkys A, MD  NIFEdipine (PROCARDIA XL/ADALAT-CC) 60 MG 24 hr tablet Take 60 mg by mouth 2 (two) times daily. 12/03/15   [provider]  nitroGLYCERIN (NITROSTAT) 0.4 MG SL tablet Place 0.4 mg under the tongue See admin instructions. Mat repeat every 5 minutes. If third tab needed call 911 06/23/17   [provider]  Nutritional Supplements (FEEDING SUPPLEMENT, NEPRO CARB STEADY,) LIQD Take 237 mLs by mouth 2 (two) times daily between meals. 06/09/17   Regalado, Belkys A, MD  pravastatin (PRAVACHOL) 40 MG tablet Take 40 mg by mouth daily.    [provider]  tiotropium (SPIRIVA HANDIHALER) 18 MCG inhalation capsule Place 1 capsule into inhaler and inhale daily. 08/10/14   [provider]  traMADol (ULTRAM) 50 MG tablet Take 50 mg by mouth every 8 (eight) hours as needed for pain.     [provider]  traZODone (DESYREL) 100 MG tablet Take 50-100 mg by mouth at bedtime as needed for sleep. 07/19/17   [provider]    Family History Family History  Problem Relation Age of Onset  . Heart disease Mother   . Hypertension Mother   . Heart disease Father   . Hypertension Father   . Heart disease Sister   . Diabetes Sister   . Stroke Sister   . Kidney  disease Sister   . Hypertension Sister   . Heart disease Sister   . Kidney disease Sister   . Hypertension Sister   . Kidney disease Daughter   . Hypertension Daughter     Social History Social History   Tobacco Use  . Smoking status: Former Games developer  . Smokeless tobacco: Never Used  Substance Use Topics  . Alcohol use: No  . Drug use: No     Allergies   Patient has no known allergies.   Review of Systems Review of Systems  Constitutional: Positive for appetite change. Negative for chills and fever.  HENT: Negative for ear pain and sore throat.   Eyes: Negative for pain and visual disturbance.  Respiratory: Positive for cough. Negative for shortness of breath.   Cardiovascular: Negative for chest pain and palpitations.  Gastrointestinal: Negative for abdominal pain and vomiting.  Genitourinary:  Negative for dysuria, frequency and hematuria.  Musculoskeletal: Negative for arthralgias and back pain.  Skin: Negative for color change and rash.  Neurological: Positive for weakness. Negative for speech change, focal weakness, seizures, syncope and headaches.  Psychiatric/Behavioral: Negative for confusion and memory loss.  All other systems reviewed and are negative.    Physical Exam Updated Vital Signs BP (!) 128/45   Pulse 80   Temp (!) 102 F (38.9 C) (Oral)   Resp (!) 23   Ht  (1.6 m)   Wt 59 kg (130 lb)   SpO2 95%   BMI 23.03 kg/m   Physical Exam  Constitutional: She is oriented to person, place, and time. She appears well-developed and well-nourished. No distress.  HENT:  Head: Normocephalic and atraumatic.  Right Ear: External ear normal.  Left Ear: External ear normal.  Nose: Nose normal.  Mouth/Throat: Oropharynx is clear and moist.  Eyes: Conjunctivae are normal.  Neck: Neck supple.  Cardiovascular: Normal rate, regular rhythm, normal heart sounds and intact distal pulses.  No murmur heard. Pulmonary/Chest: Effort normal and breath sounds  normal. No respiratory distress.  Abdominal: Soft. There is no tenderness.  Musculoskeletal: Normal range of motion. She exhibits no edema, tenderness or deformity.  Neurological: She is alert and oriented to person, place, and time. No sensory deficit. She exhibits normal muscle tone.  Skin: Skin is warm and dry.  Psychiatric: She has a normal mood and affect.  Nursing note and vitals reviewed.    ED Treatments / Results  Labs (all labs ordered are listed, but only abnormal results are displayed) Labs Reviewed  MRSA PCR SCREENING - Abnormal; Notable for the following components:      Result Value   MRSA by PCR POSITIVE (*)    All other components within normal limits  COMPREHENSIVE METABOLIC PANEL - Abnormal; Notable for the following components:   Glucose, Bld 146 (*)    Creatinine, Ser 4.88 (*)    Calcium 8.3 (*)    Albumin 2.9 (*)    AST 47 (*)    GFR calc non Af Amer 7 (*)    GFR calc Af Amer 9 (*)    All other components within normal limits  CBC WITH DIFFERENTIAL/PLATELET - Abnormal; Notable for the following components:   WBC 12.0 (*)    Hemoglobin 11.8 (*)    RDW 19.6 (*)    Neutro Abs 10.2 (*)    All other components within normal limits  CBC WITH DIFFERENTIAL/PLATELET - Abnormal; Notable for the following components:   Hemoglobin 10.8 (*)    HCT 35.9 (*)    RDW 19.7 (*)    All other components within normal limits  COMPREHENSIVE METABOLIC PANEL - Abnormal; Notable for the following components:   Chloride 100 (*)    Glucose, Bld 197 (*)    BUN 23 (*)    Creatinine, Ser 5.38 (*)    Calcium 8.0 (*)    Albumin 2.7 (*)    AST 54 (*)    GFR calc non Af Amer 7 (*)    GFR calc Af Amer 8 (*)    All other components within normal limits  TROPONIN I - Abnormal; Notable for the following components:   Troponin I 0.07 (*)    All other components within normal limits  GLUCOSE, CAPILLARY - Abnormal; Notable for the following components:   Glucose-Capillary 203 (*)     All other components within normal limits  GLUCOSE, CAPILLARY - Abnormal; Notable  for the following components:   Glucose-Capillary 165 (*)    All other components within normal limits  CBG MONITORING, ED - Abnormal; Notable for the following components:   Glucose-Capillary 257 (*)    All other components within normal limits  CULTURE, BLOOD (ROUTINE X 2)  CULTURE, BLOOD (ROUTINE X 2)  BLOOD CULTURE ID PANEL (REFLEXED)  PROTIME-INR  LACTIC ACID, PLASMA  PROCALCITONIN  CK  TSH  GLUCOSE, CAPILLARY  URINALYSIS, ROUTINE W REFLEX MICROSCOPIC  CBC  I-STAT CG4 LACTIC ACID, ED  I-STAT TROPONIN, ED    EKG EKG Interpretation  Date/Time:  Tuesday Jul 27 2017 19:10:13 EDT Ventricular Rate:  83 PR Interval:    QRS Duration: 111 QT Interval:  378 QTC Calculation: 445 R Axis:   10 Text Interpretation:  Sinus rhythm Probable left atrial enlargement LVH with secondary repolarization abnormality Baseline wander in lead(s) II III aVF new T wave inversions v5-6 compared with prior 3/19 Confirmed by Meridee Score [phone]) on 07/27/2017 7:57:33 PM Also confirmed by Meridee Score [phone]), editor Elita Quick ([id number])  on 07/28/2017 6:58:12 AM   Radiology Dg Chest 2 View  Result Date: 07/27/2017 CLINICAL DATA:  Acute onset of generalized weakness and fever. EXAM: CHEST - 2 VIEW COMPARISON:  Chest radiograph performed 06/07/2017 FINDINGS: The lungs are well-aerated. Vascular congestion is noted. Increased interstitial markings raise concern for pulmonary edema. Small bilateral pleural effusions are suspected. Chronic parenchymal changes are again noted at the right lung base. No pneumothorax is seen. The heart is borderline enlarged. No acute osseous abnormalities are seen. IMPRESSION: Vascular congestion and borderline cardiomegaly. Increased interstitial markings raise concern for pulmonary edema. Small bilateral pleural effusions suspected. Electronically Signed   By: Roanna Raider M.D.   On:  07/27/2017 21:11   Ct Head Wo Contrast  Result Date: 07/27/2017 CLINICAL DATA:  Weakness after leaving dialysis today. History of stroke, hypertension and diabetes. EXAM: CT HEAD WITHOUT CONTRAST TECHNIQUE: Contiguous axial images were obtained from the base of the skull through the vertex without intravenous contrast. COMPARISON:  CT HEAD Jul 30, 2015 FINDINGS: BRAIN: No intraparenchymal hemorrhage, mass effect nor midline shift. The ventricles and sulci are normal for age. Patchy supratentorial white matter hypodensities less than expected for patient's age, though non-specific are most compatible with chronic small vessel ischemic disease. No acute large vascular territory infarcts. No abnormal extra-axial fluid collections. Basal cisterns are patent. VASCULAR: Moderate to severe calcific atherosclerosis of the carotid siphons. SKULL: No skull fracture. No significant scalp soft tissue swelling. Partially empty sella. SINUSES/ORBITS: The mastoid air-cells and included paranasal sinuses are well-aerated.The included ocular globes and orbital contents are non-suspicious. Status post bilateral ocular lens implants. OTHER: None. IMPRESSION: Negative noncontrast CT HEAD for age. Electronically Signed   By: Awilda Metro M.D.   On: 07/27/2017 22:53    Procedures .Critical Care Performed by: Terrilee Files, MD Authorized by: Terrilee Files, MD   Critical care provider statement:    Critical care time (minutes):  30   Critical care was necessary to treat or prevent imminent or life-threatening deterioration of the following conditions:  Sepsis   Critical care was time spent personally by me on the following activities:  Development of treatment plan with patient or surrogate, discussions with consultants, evaluation of patient's response to treatment, examination of patient, obtaining history from patient or surrogate, ordering and performing treatments and interventions, ordering and review of  laboratory studies, ordering and review of radiographic studies, pulse oximetry, re-evaluation of patient's condition  and review of old charts   I assumed direction of critical care for this patient from another provider in my specialty: no     (including critical care time)  Medications Ordered in ED Medications  sodium chloride 0.9 % bolus 500 mL (has no administration in time range)  acetaminophen (TYLENOL) tablet 650 mg (has no administration in time range)     Initial Impression / Assessment and Plan / ED Course  I have reviewed the triage vital signs and the nursing notes.  Pertinent labs & imaging results that were available during my care of the patient were reviewed by me and considered in my medical decision making (see chart for details).  Clinical Course as of Jul 29 1247  Tue Jul 27, 2017  2125 But she is a family member here states this is how the patient is active before when she is bacteremic.  Her work-up so far has been fairly unremarkable other than a slightly elevated white count.  She is got some blood cultures so will give her some empiric Vanco and get her admitted to the hospital.   [MB]  2134 Patient is here with fever in the setting of dialysis.  Her chest x-ray does not show an obvious infiltrate.  She still is weak and unable to ambulate.  I have discussed with the hospitalist for admission and they recommend broadening her coverage more than Vanco so we will also cover with cefepime.   [MB]    Clinical Course User Index [MB] Terrilee Files, MD     Final Clinical Impressions(s) / ED Diagnoses   Final diagnoses:  SIRS (systemic inflammatory response syndrome) (HCC)  ESRD (end stage renal disease) on dialysis [facility])  Weakness    ED Discharge Orders    None       Terrilee Files, MD 07/28/17 1250    Terrilee Files, MD 08/10/17 [phone]

## 2017-07-27 NOTE — ED Notes (Signed)
ED provider at bedside.

## 2017-07-28 ENCOUNTER — Encounter (HOSPITAL_COMMUNITY): Payer: Self-pay | Admitting: Internal Medicine

## 2017-07-28 DIAGNOSIS — R6510 Systemic inflammatory response syndrome (SIRS) of non-infectious origin without acute organ dysfunction: Secondary | ICD-10-CM

## 2017-07-28 DIAGNOSIS — R7881 Bacteremia: Secondary | ICD-10-CM

## 2017-07-28 DIAGNOSIS — Z992 Dependence on renal dialysis: Secondary | ICD-10-CM | POA: Diagnosis not present

## 2017-07-28 DIAGNOSIS — N186 End stage renal disease: Secondary | ICD-10-CM | POA: Diagnosis not present

## 2017-07-28 DIAGNOSIS — Z841 Family history of disorders of kidney and ureter: Secondary | ICD-10-CM

## 2017-07-28 DIAGNOSIS — E1121 Type 2 diabetes mellitus with diabetic nephropathy: Secondary | ICD-10-CM | POA: Diagnosis not present

## 2017-07-28 DIAGNOSIS — Z8249 Family history of ischemic heart disease and other diseases of the circulatory system: Secondary | ICD-10-CM | POA: Diagnosis not present

## 2017-07-28 DIAGNOSIS — E1122 Type 2 diabetes mellitus with diabetic chronic kidney disease: Secondary | ICD-10-CM | POA: Diagnosis not present

## 2017-07-28 DIAGNOSIS — Z833 Family history of diabetes mellitus: Secondary | ICD-10-CM

## 2017-07-28 DIAGNOSIS — I69351 Hemiplegia and hemiparesis following cerebral infarction affecting right dominant side: Secondary | ICD-10-CM | POA: Diagnosis not present

## 2017-07-28 DIAGNOSIS — Z87891 Personal history of nicotine dependence: Secondary | ICD-10-CM

## 2017-07-28 DIAGNOSIS — B952 Enterococcus as the cause of diseases classified elsewhere: Secondary | ICD-10-CM

## 2017-07-28 LAB — COMPREHENSIVE METABOLIC PANEL
ALBUMIN: 2.7 g/dL — AB (ref 3.5–5.0)
ALT: 43 U/L (ref 14–54)
AST: 54 U/L — AB (ref 15–41)
Alkaline Phosphatase: 113 U/L (ref 38–126)
Anion gap: 14 (ref 5–15)
BILIRUBIN TOTAL: 0.7 mg/dL (ref 0.3–1.2)
BUN: 23 mg/dL — AB (ref 6–20)
CALCIUM: 8 mg/dL — AB (ref 8.9–10.3)
CO2: 25 mmol/L (ref 22–32)
Chloride: 100 mmol/L — ABNORMAL LOW (ref 101–111)
Creatinine, Ser: 5.38 mg/dL — ABNORMAL HIGH (ref 0.44–1.00)
GFR calc Af Amer: 8 mL/min — ABNORMAL LOW (ref >60)
GFR calc non Af Amer: 7 mL/min — ABNORMAL LOW (ref >60)
GLUCOSE: 197 mg/dL — AB (ref 65–99)
Potassium: 4.1 mmol/L (ref 3.5–5.1)
Sodium: 139 mmol/L (ref 135–145)
TOTAL PROTEIN: 6.9 g/dL (ref 6.5–8.1)

## 2017-07-28 LAB — CBC WITH DIFFERENTIAL/PLATELET
Abs Immature Granulocytes: 0 10*3/uL (ref 0.0–0.1)
Basophils Absolute: 0 10*3/uL (ref 0.0–0.1)
Basophils Relative: 0 %
Eosinophils Absolute: 0.1 10*3/uL (ref 0.0–0.7)
Eosinophils Relative: 1 %
HEMATOCRIT: 35.9 % — AB (ref 36.0–46.0)
HEMOGLOBIN: 10.8 g/dL — AB (ref 12.0–15.0)
Immature Granulocytes: 0 %
LYMPHS ABS: 1.2 10*3/uL (ref 0.7–4.0)
LYMPHS PCT: 12 %
MCH: 27.8 pg (ref 26.0–34.0)
MCHC: 30.1 g/dL (ref 30.0–36.0)
MCV: 92.3 fL (ref 78.0–100.0)
Monocytes Absolute: 0.8 10*3/uL (ref 0.1–1.0)
Monocytes Relative: 8 %
NEUTROS PCT: 79 %
Neutro Abs: 7.4 10*3/uL (ref 1.7–7.7)
Platelets: 247 10*3/uL (ref 150–400)
RBC: 3.89 MIL/uL (ref 3.87–5.11)
RDW: 19.7 % — ABNORMAL HIGH (ref 11.5–15.5)
WBC: 9.5 10*3/uL (ref 4.0–10.5)

## 2017-07-28 LAB — BLOOD CULTURE ID PANEL (REFLEXED)
Acinetobacter baumannii: NOT DETECTED
CANDIDA GLABRATA: NOT DETECTED
Candida albicans: NOT DETECTED
Candida krusei: NOT DETECTED
Candida parapsilosis: NOT DETECTED
Candida tropicalis: NOT DETECTED
ENTEROBACTER CLOACAE COMPLEX: NOT DETECTED
ENTEROCOCCUS SPECIES: DETECTED — AB
Enterobacteriaceae species: NOT DETECTED
Escherichia coli: NOT DETECTED
HAEMOPHILUS INFLUENZAE: NOT DETECTED
Klebsiella oxytoca: NOT DETECTED
Klebsiella pneumoniae: NOT DETECTED
LISTERIA MONOCYTOGENES: NOT DETECTED
NEISSERIA MENINGITIDIS: NOT DETECTED
PROTEUS SPECIES: NOT DETECTED
Pseudomonas aeruginosa: NOT DETECTED
STAPHYLOCOCCUS AUREUS BCID: NOT DETECTED
STREPTOCOCCUS SPECIES: NOT DETECTED
Serratia marcescens: NOT DETECTED
Staphylococcus species: NOT DETECTED
Streptococcus agalactiae: NOT DETECTED
Streptococcus pneumoniae: NOT DETECTED
Streptococcus pyogenes: NOT DETECTED
VANCOMYCIN RESISTANCE: NOT DETECTED

## 2017-07-28 LAB — TROPONIN I: Troponin I: 0.07 ng/mL (ref <0.03)

## 2017-07-28 LAB — MRSA PCR SCREENING: MRSA by PCR: POSITIVE — AB

## 2017-07-28 LAB — GLUCOSE, CAPILLARY
GLUCOSE-CAPILLARY: 93 mg/dL (ref 65–99)
GLUCOSE-CAPILLARY: 165 mg/dL — AB (ref 65–99)
GLUCOSE-CAPILLARY: 143 mg/dL — AB (ref 65–99)
GLUCOSE-CAPILLARY: 161 mg/dL — AB (ref 65–99)
Glucose-Capillary: 203 mg/dL — ABNORMAL HIGH (ref 65–99)

## 2017-07-28 LAB — LACTIC ACID, PLASMA: Lactic Acid, Venous: 1.8 mmol/L (ref 0.5–1.9)

## 2017-07-28 LAB — CK: CK TOTAL: 129 U/L (ref 38–234)

## 2017-07-28 LAB — PROCALCITONIN: Procalcitonin: 1.89 ng/mL

## 2017-07-28 LAB — TSH: TSH: 1.59 u[IU]/mL (ref 0.350–4.500)

## 2017-07-28 MED ORDER — ISOSORBIDE MONONITRATE ER 30 MG PO TB24
30.0000 mg | ORAL_TABLET | Freq: Every evening | ORAL | Status: DC
  Administered 2017-07-28 – 2017-08-03 (×6): 30 mg via ORAL
  Filled 2017-07-28 (×6): qty 1

## 2017-07-28 MED ORDER — INSULIN GLARGINE 100 UNIT/ML ~~LOC~~ SOLN
5.0000 [IU] | Freq: Every day | SUBCUTANEOUS | Status: DC
  Administered 2017-07-28 – 2017-08-03 (×8): 5 [IU] via SUBCUTANEOUS
  Filled 2017-07-28 (×10): qty 0.05

## 2017-07-28 MED ORDER — MUPIROCIN 2 % EX OINT
1.0000 "application " | TOPICAL_OINTMENT | Freq: Two times a day (BID) | CUTANEOUS | Status: AC
  Administered 2017-07-28 – 2017-08-02 (×7): 1 via NASAL
  Filled 2017-07-28 (×3): qty 22

## 2017-07-28 MED ORDER — PIPERACILLIN-TAZOBACTAM 3.375 G IVPB 30 MIN
3.3750 g | Freq: Once | INTRAVENOUS | Status: DC
  Filled 2017-07-28: qty 50

## 2017-07-28 MED ORDER — VANCOMYCIN HCL 500 MG IV SOLR
500.0000 mg | INTRAVENOUS | Status: DC
  Filled 2017-07-28: qty 500

## 2017-07-28 MED ORDER — ACETAMINOPHEN 650 MG RE SUPP: 650.0000 mg | Freq: Four times a day (QID) | RECTAL | Status: DC | PRN

## 2017-07-28 MED ORDER — PIPERACILLIN-TAZOBACTAM 3.375 G IVPB
3.3750 g | Freq: Two times a day (BID) | INTRAVENOUS | Status: DC
  Administered 2017-07-28 (×2): 3.375 g via INTRAVENOUS
  Filled 2017-07-28 (×2): qty 50

## 2017-07-28 MED ORDER — ACETAMINOPHEN 325 MG PO TABS
650.0000 mg | ORAL_TABLET | Freq: Four times a day (QID) | ORAL | Status: DC | PRN
  Administered 2017-07-28 – 2017-08-04 (×5): 650 mg via ORAL
  Filled 2017-07-28 (×6): qty 2

## 2017-07-28 MED ORDER — INSULIN ASPART 100 UNIT/ML ~~LOC~~ SOLN
0.0000 [IU] | Freq: Three times a day (TID) | SUBCUTANEOUS | Status: DC
  Administered 2017-07-28: 2 [IU] via SUBCUTANEOUS
  Administered 2017-07-28 – 2017-07-31 (×3): 1 [IU] via SUBCUTANEOUS
  Administered 2017-08-01 – 2017-08-02 (×2): 2 [IU] via SUBCUTANEOUS
  Administered 2017-08-02: 1 [IU] via SUBCUTANEOUS
  Administered 2017-08-03: 2 [IU] via SUBCUTANEOUS
  Administered 2017-08-04: 1 [IU] via SUBCUTANEOUS
  Administered 2017-08-04: 3 [IU] via SUBCUTANEOUS

## 2017-07-28 MED ORDER — ONDANSETRON HCL 4 MG/2ML IJ SOLN
4.0000 mg | Freq: Four times a day (QID) | INTRAMUSCULAR | Status: DC | PRN
  Administered 2017-08-02: 4 mg via INTRAVENOUS
  Filled 2017-07-28: qty 2

## 2017-07-28 MED ORDER — NITROGLYCERIN 0.4 MG SL SUBL: 0.4000 mg | SUBLINGUAL_TABLET | SUBLINGUAL | Status: DC

## 2017-07-28 MED ORDER — FLUTICASONE FUROATE-VILANTEROL 100-25 MCG/INH IN AEPB
1.0000 | INHALATION_SPRAY | Freq: Every day | RESPIRATORY_TRACT | Status: DC
  Administered 2017-07-30 – 2017-08-04 (×4): 1 via RESPIRATORY_TRACT
  Filled 2017-07-28 (×4): qty 28

## 2017-07-28 MED ORDER — LISINOPRIL 40 MG PO TABS
40.0000 mg | ORAL_TABLET | Freq: Every evening | ORAL | Status: DC
  Administered 2017-07-28 – 2017-08-03 (×6): 40 mg via ORAL
  Filled 2017-07-28 (×6): qty 1

## 2017-07-28 MED ORDER — METOPROLOL TARTRATE 12.5 MG HALF TABLET
12.5000 mg | ORAL_TABLET | Freq: Two times a day (BID) | ORAL | Status: DC
  Administered 2017-07-28 – 2017-08-04 (×10): 12.5 mg via ORAL
  Filled 2017-07-28 (×11): qty 1

## 2017-07-28 MED ORDER — NIFEDIPINE ER OSMOTIC RELEASE 60 MG PO TB24
60.0000 mg | ORAL_TABLET | Freq: Two times a day (BID) | ORAL | Status: DC
  Administered 2017-07-28 – 2017-08-04 (×9): 60 mg via ORAL
  Filled 2017-07-28 (×10): qty 1

## 2017-07-28 MED ORDER — TRAMADOL HCL 50 MG PO TABS: 50.0000 mg | ORAL_TABLET | Freq: Three times a day (TID) | ORAL | Status: DC | PRN

## 2017-07-28 MED ORDER — PRAVASTATIN SODIUM 40 MG PO TABS
40.0000 mg | ORAL_TABLET | Freq: Every day | ORAL | Status: DC
  Administered 2017-07-28 – 2017-08-04 (×7): 40 mg via ORAL
  Filled 2017-07-28 (×7): qty 1

## 2017-07-28 MED ORDER — HEPARIN SODIUM (PORCINE) 5000 UNIT/ML IJ SOLN
5000.0000 [IU] | Freq: Three times a day (TID) | INTRAMUSCULAR | Status: DC
  Administered 2017-07-28 – 2017-08-04 (×16): 5000 [IU] via SUBCUTANEOUS
  Filled 2017-07-28 (×17): qty 1

## 2017-07-28 MED ORDER — ALBUTEROL SULFATE HFA 108 (90 BASE) MCG/ACT IN AERS: 2.0000 | INHALATION_SPRAY | Freq: Four times a day (QID) | RESPIRATORY_TRACT | Status: DC | PRN

## 2017-07-28 MED ORDER — ONDANSETRON HCL 4 MG PO TABS: 4.0000 mg | ORAL_TABLET | Freq: Four times a day (QID) | ORAL | Status: DC | PRN

## 2017-07-28 MED ORDER — CALCIUM ACETATE (PHOS BINDER) 667 MG PO CAPS
1334.0000 mg | ORAL_CAPSULE | Freq: Three times a day (TID) | ORAL | Status: DC
  Administered 2017-07-28 – 2017-08-04 (×18): 1334 mg via ORAL
  Filled 2017-07-28 (×20): qty 2

## 2017-07-28 MED ORDER — NEPRO/CARBSTEADY PO LIQD
237.0000 mL | Freq: Two times a day (BID) | ORAL | Status: DC
  Administered 2017-07-28 – 2017-08-04 (×11): 237 mL via ORAL
  Filled 2017-07-28 (×18): qty 237

## 2017-07-28 MED ORDER — CHLORHEXIDINE GLUCONATE CLOTH 2 % EX PADS
6.0000 | MEDICATED_PAD | Freq: Every day | CUTANEOUS | Status: AC
  Administered 2017-07-28 – 2017-08-01 (×4): 6 via TOPICAL

## 2017-07-28 MED ORDER — TRAMADOL HCL 50 MG PO TABS
50.0000 mg | ORAL_TABLET | Freq: Two times a day (BID) | ORAL | Status: DC | PRN
  Filled 2017-07-28: qty 1

## 2017-07-28 MED ORDER — DOXERCALCIFEROL 2.5 MCG PO CAPS
2.5000 ug | ORAL_CAPSULE | ORAL | Status: DC
  Filled 2017-07-28 (×3): qty 1

## 2017-07-28 MED ORDER — ALBUTEROL SULFATE (2.5 MG/3ML) 0.083% IN NEBU: 2.5000 mg | INHALATION_SOLUTION | Freq: Four times a day (QID) | RESPIRATORY_TRACT | Status: DC | PRN

## 2017-07-28 MED ORDER — CLONIDINE HCL 0.3 MG/24HR TD PTWK
0.3000 mg | MEDICATED_PATCH | TRANSDERMAL | Status: DC
  Administered 2017-07-28: 0.3 mg via TRANSDERMAL
  Filled 2017-07-28: qty 1

## 2017-07-28 MED ORDER — TRAZODONE HCL 50 MG PO TABS
50.0000 mg | ORAL_TABLET | Freq: Every evening | ORAL | Status: DC | PRN
  Administered 2017-07-31 – 2017-08-03 (×3): 100 mg via ORAL
  Filled 2017-07-28 (×3): qty 2

## 2017-07-28 MED ORDER — TIOTROPIUM BROMIDE MONOHYDRATE 18 MCG IN CAPS
1.0000 | ORAL_CAPSULE | Freq: Every day | RESPIRATORY_TRACT | Status: DC
  Administered 2017-07-30 – 2017-08-04 (×4): 18 ug via RESPIRATORY_TRACT
  Filled 2017-07-28 (×4): qty 5

## 2017-07-28 NOTE — Consult Note (Signed)
Reason for Consult: To manage dialysis and dialysis related needs Referring Physician: [name] is an 84 y.o. female with past medical history significant for hypertension, diabetes mellitus, coronary artery disease, history of stroke as well as ESRD-[facility] Joesph July.  Patient was noted to be hospitalized here at the end of March with hypoxic respiratory failure dry weight was lowered at that time.  She was noted to present to the emergency department after dialysis yesterday with complaints of weakness.  She was found to have a temperature of 102, mild troponin elevation-blood cultures positive for gram-positive cocci- enterococcus- placed on antibiotics with much improvement.  Not sure of the source of bacteremia.  Patient has an AV graft.  She said that she felt fine prior to dialysis   [facility]-Dr. Katy Apo hours EDW 129 pounds. HD Bath 2/2.5, Dialyzer 180, Heparin 2000 bolus, then with hourly rate . Access AVG. 15-gauge blood flow rate 400 on Mircera 130 mcg to q. weeks, last given on 5/9.  Hectorol 2.5 q. treatment  Past Medical History:  Diagnosis Date  . CHF (congestive heart failure) ([place])   . COPD (chronic obstructive pulmonary disease) ([place])   . Coronary artery disease   . Diabetes mellitus without complication ([place])   . Heart disease   . Hypertension   . Renal disorder   . Stroke [facility])     Past Surgical History:  Procedure Laterality Date  . ABDOMINAL HYSTERECTOMY    . CHOLECYSTECTOMY    . TONSILLECTOMY      Family History  Problem Relation Age of Onset  . Heart disease Mother   . Hypertension Mother   . Heart disease Father   . Hypertension Father   . Heart disease Sister   . Diabetes Sister   . Stroke Sister   . Kidney disease Sister   . Hypertension Sister   . Heart disease Sister   . Kidney disease Sister   . Hypertension Sister   . Kidney disease Daughter   . Hypertension Daughter      Social History:  reports that she has quit smoking. She has never used smokeless tobacco. She reports that she does not drink alcohol or use drugs.  Allergies: No Known Allergies  Medications: I have reviewed the patient's current medications.   Results for orders placed or performed during the hospital encounter of 07/27/17 (from the past 48 hour(s))  Comprehensive metabolic panel     Status: Abnormal   Collection Time: 07/27/17  7:11 PM  Result Value Ref Range   Sodium 139 135 - 145 mmol/L   Potassium 4.1 3.5 - 5.1 mmol/L   Chloride 101 101 - 111 mmol/L   CO2 25 22 - 32 mmol/L   Glucose, Bld 146 (H) 65 - 99 mg/dL   BUN 18 6 - 20 mg/dL   Creatinine, Ser 4.88 (H) 0.44 - 1.00 mg/dL   Calcium 8.3 (L) 8.9 - 10.3 mg/dL   Total Protein 7.5 6.5 - 8.1 g/dL   Albumin 2.9 (L) 3.5 - 5.0 g/dL   AST 47 (H) 15 - 41 U/L   ALT 39 14 - 54 U/L   Alkaline Phosphatase 124 38 - 126 U/L   Total Bilirubin 0.9 0.3 - 1.2 mg/dL   GFR calc non Af Amer 7 (L) >60 mL/min   GFR calc Af Amer 9 (L) >60 mL/min    Comment: (NOTE) The eGFR has been calculated using the CKD EPI equation.  This calculation has not been validated in all clinical situations. eGFR's persistently <60 mL/min signify possible Chronic Kidney Disease.    Anion gap 13 5 - 15    Comment: Performed at [facility] [address] [place], [place] [address]  CBC with Differential     Status: Abnormal   Collection Time: 07/27/17  7:11 PM  Result Value Ref Range   WBC 12.0 (H) 4.0 - 10.5 K/uL   RBC 4.19 3.87 - 5.11 MIL/uL   Hemoglobin 11.8 (L) 12.0 - 15.0 g/dL   HCT 38.3 36.0 - 46.0 %   MCV 91.4 78.0 - 100.0 fL   MCH 28.2 26.0 - 34.0 pg   MCHC 30.8 30.0 - 36.0 g/dL   RDW 19.6 (H) 11.5 - 15.5 %   Platelets 277 150 - 400 K/uL   Neutrophils Relative % 84 %   Neutro Abs 10.2 (H) 1.7 - 7.7 K/uL   Lymphocytes Relative 6 %   Lymphs Abs 0.7 0.7 - 4.0 K/uL   Monocytes Relative 9 %   Monocytes Absolute 1.0 0.1 - 1.0 K/uL    Eosinophils Relative 1 %   Eosinophils Absolute 0.1 0.0 - 0.7 K/uL   Basophils Relative 0 %   Basophils Absolute 0.0 0.0 - 0.1 K/uL   Immature Granulocytes 0 %   Abs Immature Granulocytes 0.0 0.0 - 0.1 K/uL    Comment: Performed at [facility], [address] [place], [place] [address]  Protime-INR     Status: None   Collection Time: 07/27/17  7:11 PM  Result Value Ref Range   Prothrombin Time 14.0 11.4 - 15.2 seconds   INR 1.09     Comment: Performed at [facility] [address], [place], [place] [address]  Culture, blood (Routine x 2)     Status: None (Preliminary result)   Collection Time: 07/27/17  7:13 PM  Result Value Ref Range   Specimen Description BLOOD RIGHT FOREARM    Special Requests      BOTTLES DRAWN AEROBIC AND ANAEROBIC Blood Culture adequate volume   Culture  Setup Time      GRAM POSITIVE COCCI IN BOTH AEROBIC AND ANAEROBIC BOTTLES CRITICAL RESULT CALLED TO, READ BACK BY AND VERIFIED WITH: PHARMD E T[place]seh 07/28/17 1305 PM BY MORAC Performed at [facility], [place] [address] [place], [place] [address]    Culture GRAM POSITIVE COCCI    Report Status PENDING   Blood Culture ID Panel (Reflexed)     Status: Abnormal   Collection Time: 07/27/17  7:13 PM  Result Value Ref Range   Enterococcus species DETECTED (A) NOT DETECTED    Comment: CRITICAL RESULT CALLED TO, READ BACK BY AND VERIFIED WITH: PHARMD E SINCLARE 07/28/17 AT 1305 BY CM    Vancomycin resistance NOT DETECTED NOT DETECTED   Listeria monocytogenes NOT DETECTED NOT DETECTED   Staphylococcus species NOT DETECTED NOT DETECTED   Staphylococcus aureus NOT DETECTED NOT DETECTED   Streptococcus species NOT DETECTED NOT DETECTED   Streptococcus agalactiae NOT DETECTED NOT DETECTED   Streptococcus pneumoniae NOT DETECTED NOT DETECTED   Streptococcus pyogenes NOT DETECTED NOT DETECTED   Acinetobacter baumannii NOT DETECTED NOT DETECTED   Enterobacteriaceae species NOT DETECTED NOT DETECTED    Enterobacter cloacae complex NOT DETECTED NOT DETECTED   Escherichia coli NOT DETECTED NOT DETECTED   Klebsiella oxytoca NOT DETECTED NOT DETECTED   Klebsiella pneumoniae NOT DETECTED NOT DETECTED   Proteus species NOT DETECTED  NOT DETECTED   Serratia marcescens NOT DETECTED NOT DETECTED   Haemophilus influenzae NOT DETECTED NOT DETECTED   Neisseria meningitidis NOT DETECTED NOT DETECTED   Pseudomonas aeruginosa NOT DETECTED NOT DETECTED   Candida albicans NOT DETECTED NOT DETECTED   Candida glabrata NOT DETECTED NOT DETECTED   Candida krusei NOT DETECTED NOT DETECTED   Candida parapsilosis NOT DETECTED NOT DETECTED   Candida tropicalis NOT DETECTED NOT DETECTED    Comment: Performed at [facility], [place] [address], [place], [place] [address]  Culture, blood (Routine x 2)     Status: None (Preliminary result)   Collection Time: 07/27/17  7:18 PM  Result Value Ref Range   Specimen Description BLOOD RIGHT HAND    Special Requests      BOTTLES DRAWN AEROBIC AND ANAEROBIC Blood Culture adequate volume   Culture  Setup Time      GRAM POSITIVE COCCI IN CHAINS IN BOTH AEROBIC AND ANAEROBIC BOTTLES Performed at [facility], [place] [address] [place], [place] [address]    Culture GRAM POSITIVE COCCI    Report Status PENDING   I-Stat CG4 Lactic Acid, ED     Status: None   Collection Time: 07/27/17  7:24 PM  Result Value Ref Range   Lactic Acid, Venous 1.72 0.5 - 1.9 mmol/L  I-stat troponin, ED     Status: None   Collection Time: 07/27/17 10:25 PM  Result Value Ref Range   Troponin i, poc 0.05 0.00 - 0.08 ng/mL   Comment 3            Comment: Due to the release kinetics of cTnI, a negative result within the first hours of the onset of symptoms does not rule out myocardial infarction with certainty. If myocardial infarction is still suspected, repeat the test at appropriate intervals.   POC CBG, ED     Status: Abnormal   Collection Time: 07/27/17 11:49 PM  Result Value  Ref Range   Glucose-Capillary 257 (H) 65 - 99 mg/dL  MRSA PCR Screening     Status: Abnormal   Collection Time: 07/28/17  1:28 AM  Result Value Ref Range   MRSA by PCR POSITIVE (A) NEGATIVE    Comment:        The GeneXpert MRSA Assay (FDA approved for NASAL specimens only), is one component of a comprehensive MRSA colonization surveillance program. It is not intended to diagnose MRSA infection nor to guide or monitor treatment for MRSA infections. RESULT CALLED TO, READ BACK BY AND VERIFIED WITH: Gypsy Lore RN 07/28/17 0358 JDW Performed at [facility], [place] [address] [place], [place] [address]   Glucose, capillary     Status: Abnormal   Collection Time: 07/28/17  1:42 AM  Result Value Ref Range   Glucose-Capillary 203 (H) 65 - 99 mg/dL  CBC with Differential     Status: Abnormal   Collection Time: 07/28/17  2:03 AM  Result Value Ref Range   WBC 9.5 4.0 - 10.5 K/uL   RBC 3.89 3.87 - 5.11 MIL/uL   Hemoglobin 10.8 (L) 12.0 - 15.0 g/dL   HCT 35.9 (L) 36.0 - 46.0 %   MCV 92.3 78.0 - 100.0 fL   MCH 27.8 26.0 - 34.0 pg   MCHC 30.1 30.0 - 36.0 g/dL   RDW 19.7 (H) 11.5 - 15.5 %   Platelets 247 150 - 400 K/uL   Neutrophils Relative % 79 %   Neutro Abs 7.4 1.7 - 7.7  K/uL   Lymphocytes Relative 12 %   Lymphs Abs 1.2 0.7 - 4.0 K/uL   Monocytes Relative 8 %   Monocytes Absolute 0.8 0.1 - 1.0 K/uL   Eosinophils Relative 1 %   Eosinophils Absolute 0.1 0.0 - 0.7 K/uL   Basophils Relative 0 %   Basophils Absolute 0.0 0.0 - 0.1 K/uL   Immature Granulocytes 0 %   Abs Immature Granulocytes 0.0 0.0 - 0.1 K/uL    Comment: Performed at [facility] [address], [place], [place] [address]  Comprehensive metabolic panel     Status: Abnormal   Collection Time: 07/28/17  2:03 AM  Result Value Ref Range   Sodium 139 135 - 145 mmol/L   Potassium 4.1 3.5 - 5.1 mmol/L   Chloride 100 (L) 101 - 111 mmol/L   CO2 25 22 - 32 mmol/L   Glucose, Bld 197 (H) 65 - 99 mg/dL   BUN 23 (H)  6 - 20 mg/dL   Creatinine, Ser 5.38 (H) 0.44 - 1.00 mg/dL   Calcium 8.0 (L) 8.9 - 10.3 mg/dL   Total Protein 6.9 6.5 - 8.1 g/dL   Albumin 2.7 (L) 3.5 - 5.0 g/dL   AST 54 (H) 15 - 41 U/L   ALT 43 14 - 54 U/L   Alkaline Phosphatase 113 38 - 126 U/L   Total Bilirubin 0.7 0.3 - 1.2 mg/dL   GFR calc non Af Amer 7 (L) >60 mL/min   GFR calc Af Amer 8 (L) >60 mL/min    Comment: (NOTE) The eGFR has been calculated using the CKD EPI equation. This calculation has not been validated in all clinical situations. eGFR's persistently <60 mL/min signify possible Chronic Kidney Disease.    Anion gap 14 5 - 15    Comment: Performed at [facility] [address] [place], [place] [address]  Lactic acid, plasma     Status: None   Collection Time: 07/28/17  2:03 AM  Result Value Ref Range   Lactic Acid, Venous 1.8 0.5 - 1.9 mmol/L    Comment: Performed at [facility] [address], [place], [place] [address]  Procalcitonin     Status: None   Collection Time: 07/28/17  2:03 AM  Result Value Ref Range   Procalcitonin 1.89 ng/mL    Comment:        Interpretation: PCT > 0.5 ng/mL and <= 2 ng/mL: Systemic infection (sepsis) is possible, but other conditions are known to elevate PCT as well. (NOTE)       Sepsis PCT Algorithm           Lower Respiratory Tract                                      Infection PCT Algorithm    ----------------------------     ----------------------------         PCT < 0.25 ng/mL                PCT < 0.10 ng/mL         Strongly encourage             Strongly discourage   discontinuation of antibiotics    initiation of antibiotics    ----------------------------     -----------------------------       PCT 0.25 - 0.50 ng/mL  PCT 0.10 - 0.25 ng/mL               OR       >80% decrease in PCT            Discourage initiation of                                            antibiotics      Encourage discontinuation           of antibiotics     ----------------------------     -----------------------------         PCT >= 0.50 ng/mL              PCT 0.26 - 0.50 ng/mL                AND       <80% decrease in PCT             Encourage initiation of                                             antibiotics       Encourage continuation           of antibiotics    ----------------------------     -----------------------------        PCT >= 0.50 ng/mL                  PCT > 0.50 ng/mL               AND         increase in PCT                  Strongly encourage                                      initiation of antibiotics    Strongly encourage escalation           of antibiotics                                     -----------------------------                                           PCT <= 0.25 ng/mL                                                 OR                                        > 80% decrease in PCT  Discontinue / Do not initiate                                             antibiotics Performed at [facility], [place] [address] [place], [place] [address]   Troponin I     Status: Abnormal   Collection Time: 07/28/17  2:03 AM  Result Value Ref Range   Troponin I 0.07 (HH) <0.03 ng/mL    Comment: CRITICAL RESULT CALLED TO, READ BACK BY AND VERIFIED WITH: D.BENTON,RN 3086 07/28/17 M.CAMPBELL Performed at [facility], [place] [address], [place], [place] [address]   CK     Status: None   Collection Time: 07/28/17  2:03 AM  Result Value Ref Range   Total CK 129 38 - 234 U/L    Comment: Performed at [facility], [place] [address] [place], [place] [address]  TSH     Status: None   Collection Time: 07/28/17  2:03 AM  Result Value Ref Range   TSH 1.590 0.350 - 4.500 uIU/mL    Comment: Performed by a 3rd Generation assay with a functional sensitivity of <=0.01 uIU/mL. Performed at [facility], [place] [address] [place], [place] [address]   Glucose, capillary      Status: None   Collection Time: 07/28/17  8:05 AM  Result Value Ref Range   Glucose-Capillary 93 65 - 99 mg/dL  Glucose, capillary     Status: Abnormal   Collection Time: 07/28/17 11:28 AM  Result Value Ref Range   Glucose-Capillary 165 (H) 65 - 99 mg/dL    Dg Chest 2 View  Result Date: 07/27/2017 CLINICAL DATA:  Acute onset of generalized weakness and fever. EXAM: CHEST - 2 VIEW COMPARISON:  Chest radiograph performed 06/07/2017 FINDINGS: The lungs are well-aerated. Vascular congestion is noted. Increased interstitial markings raise concern for pulmonary edema. Small bilateral pleural effusions are suspected. Chronic parenchymal changes are again noted at the right lung base. No pneumothorax is seen. The heart is borderline enlarged. No acute osseous abnormalities are seen. IMPRESSION: Vascular congestion and borderline cardiomegaly. Increased interstitial markings raise concern for pulmonary edema. Small bilateral pleural effusions suspected. Electronically Signed   By: Garald Balding M.D.   On: 07/27/2017 21:11   Ct Head Wo Contrast  Result Date: 07/27/2017 CLINICAL DATA:  Weakness after leaving dialysis today. History of stroke, hypertension and diabetes. EXAM: CT HEAD WITHOUT CONTRAST TECHNIQUE: Contiguous axial images were obtained from the base of the skull through the vertex without intravenous contrast. COMPARISON:  CT HEAD Jul 30, 2015 FINDINGS: BRAIN: No intraparenchymal hemorrhage, mass effect nor midline shift. The ventricles and sulci are normal for age. Patchy supratentorial white matter hypodensities less than expected for patient's age, though non-specific are most compatible with chronic small vessel ischemic disease. No acute large vascular territory infarcts. No abnormal extra-axial fluid collections. Basal cisterns are patent. VASCULAR: Moderate to severe calcific atherosclerosis of the carotid siphons. SKULL: No skull fracture. No significant scalp soft tissue swelling.  Partially empty sella. SINUSES/ORBITS: The mastoid air-cells and included paranasal sinuses are well-aerated.The included ocular globes and orbital contents are non-suspicious. Status post bilateral ocular lens implants. OTHER: None. IMPRESSION: Negative noncontrast CT HEAD for age. Electronically Signed   By: Elon Alas M.D.   On: 07/27/2017 22:53    ROS: Working with PT and really no complaints right  now.  She said that she only felt weak and feels better from admission Blood pressure (!) 128/39, pulse (!) 55, temperature 98.4 F (36.9 C), temperature source Oral, resp. rate 18, height 5' 3"  (1.6 m), weight 60.9 kg (134 lb 4.2 oz), SpO2 100 %. General appearance: alert and distracted Eyes: conjunctivae/corneas clear. PERRL, EOM's intact. Fundi benign. Resp: clear to auscultation bilaterally Cardio: regular rate and rhythm, S1, S2 normal, no murmur, click, rub or gallop GI: soft, non-tender; bowel sounds normal; no masses,  no organomegaly Extremities: extremities normal, atraumatic, no cyanosis or edema Left AV graft-I removed her bandage.  No erythema and no evidence of abscess  Assessment/Plan: 84 year old black female with ESRD presented with weakness after dialysis-and found to have enterococcal bacteremia 1 Enterococcal bacteremia-has responded fairly quickly to antibiotic therapy, feels to be at her baseline.  ID is involved.  Unknown source of infection.  AV graft does not appear to be the source.  Outside possibility is that she was inoculated with the bacteria at the time of her hemodialysis treatment 2 ESRD: [facility] by Dr. Amelia Jo.  We will plan for her routine dialysis tomorrow 3 Hypertension: Blood pressure seems fine.-She is on a significant regimen of clonidine patch, lisinopril, Lopressor, nifedipine-volume status seems fine.  Unclear why she needs so many blood pressure medications 4. Anemia of ESRD: Hemoglobin actually pretty good.  She  received her Mircera on 5/9.  No plans for dosing further here 5. Bones-  We will continue with Hectorol 2.5 q. treatment.  She is on PhosLo and that has been continued   [place] Zapien A 07/28/2017, 2:20 PM

## 2017-07-28 NOTE — Consult Note (Signed)
Nicole Cox for Infectious Disease    Date of Admission:  07/27/2017   Total days of antibiotics: 1 vanco/zosyn --> vanco               Reason for Consult:  Enterococcal bacteremia    Referring Provider: Shahmehdi   Assessment: Enterococcal bacteremia ESRD DM2  Plan: 1. Has been narrowed to vanco 2. Await further sensi, may be able to change to ampicillin 3. Check TTE (would not check TEE given her age) 39. Would check u/s of her fistula.   Comment-  Her source is unclear. She does not make urine so not sure that checking her UA/UCx would be useful.  Will follow with you.   Thank you so much for this interesting consult,  Principal Problem:   SIRS (systemic inflammatory response syndrome) (HCC) Active Problems:   Hypertension   DM (diabetes mellitus), type 2 with renal complications (HCC)   CAD (coronary artery disease)   ESRD on hemodialysis (HCC)   Chronic anemia   . calcium acetate  1,334 mg Oral TID WC  . Chlorhexidine Gluconate Cloth  6 each Topical Q0600  . cloNIDine  0.3 mg Transdermal Q7 days  . feeding supplement (NEPRO CARB STEADY)  237 mL Oral BID BM  . fluticasone furoate-vilanterol  1 puff Inhalation Daily  . heparin  5,000 Units Subcutaneous Q8H  . insulin aspart  0-9 Units Subcutaneous TID WC  . insulin glargine  5 Units Subcutaneous QHS  . isosorbide mononitrate  30 mg Oral QPM  . lisinopril  40 mg Oral QPM  . metoprolol tartrate  12.5 mg Oral BID  . mupirocin ointment  1 application Nasal BID  . NIFEdipine  60 mg Oral BID  . nitroGLYCERIN  0.4 mg Sublingual See admin instructions  . pravastatin  40 mg Oral Daily  . tiotropium  1 capsule Inhalation Daily    HPI: Nicole Cox is a 84 y.o. female on HD, DM2, prev CVA with R sided weakness, adm on 5-15 with 48h of weakness, starting after HD. In ED, was found to have temp 102,  WBC of 12, and she was started on vanco/zosyn. She has been normo-tensive.  Her BCx are now found to have  non-VRE enterococcus in 4/4.  Her course is further notable for Troponin of 0.07.  She has a LUE fistula which has been present for several years.    Review of Systems: Review of Systems  Constitutional: Negative for chills and fever.  Eyes: Negative for blurred vision.  Respiratory: Negative for shortness of breath.   Gastrointestinal: Negative for constipation and diarrhea.  Genitourinary: Negative for dysuria.  Neurological: Positive for weakness.  anuric Please see HPI. All other systems reviewed and negative.   Past Medical History:  Diagnosis Date  . CHF (congestive heart failure) ([place])   . COPD (chronic obstructive pulmonary disease) ([place])   . Coronary artery disease   . Diabetes mellitus without complication ([place])   . Heart disease   . Hypertension   . Renal disorder   . Stroke [facility])     Social History   Tobacco Use  . Smoking status: Former Research scientist (life sciences)  . Smokeless tobacco: Never Used  Substance Use Topics  . Alcohol use: No  . Drug use: No    Family History  Problem Relation Age of Onset  . Heart disease Mother   . Hypertension Mother   . Heart disease Father   . Hypertension Father   .  Heart disease Sister   . Diabetes Sister   . Stroke Sister   . Kidney disease Sister   . Hypertension Sister   . Heart disease Sister   . Kidney disease Sister   . Hypertension Sister   . Kidney disease Daughter   . Hypertension Daughter      Medications:  Scheduled: . calcium acetate  1,334 mg Oral TID WC  . Chlorhexidine Gluconate Cloth  6 each Topical Q0600  . cloNIDine  0.3 mg Transdermal Q7 days  . [START ON 07/29/2017] doxercalciferol  2.5 mcg Oral Q T,Th,Sa-HD  . feeding supplement (NEPRO CARB STEADY)  237 mL Oral BID BM  . fluticasone furoate-vilanterol  1 puff Inhalation Daily  . heparin  5,000 Units Subcutaneous Q8H  . insulin aspart  0-9 Units Subcutaneous TID WC  . insulin glargine  5 Units Subcutaneous QHS  . isosorbide mononitrate  30 mg Oral QPM    . lisinopril  40 mg Oral QPM  . metoprolol tartrate  12.5 mg Oral BID  . mupirocin ointment  1 application Nasal BID  . NIFEdipine  60 mg Oral BID  . nitroGLYCERIN  0.4 mg Sublingual See admin instructions  . pravastatin  40 mg Oral Daily  . tiotropium  1 capsule Inhalation Daily    Abtx:  Anti-infectives (From admission, onward)   Start     Dose/Rate Route Frequency Ordered Stop   07/29/17 1200  vancomycin (VANCOCIN) 500 mg in sodium chloride 0.9 % 100 mL IVPB     500 mg 100 mL/hr over 60 Minutes Intravenous Every T-Th-Sa (Hemodialysis) 07/28/17 0118     07/28/17 0130  piperacillin-tazobactam (ZOSYN) IVPB 3.375 g  Status:  Discontinued     3.375 g 12.5 mL/hr over 240 Minutes Intravenous Every 12 hours 07/28/17 0118 07/28/17 1325   07/28/17 0115  piperacillin-tazobactam (ZOSYN) IVPB 3.375 g  Status:  Discontinued     3.375 g 100 mL/hr over 30 Minutes Intravenous  Once 07/28/17 0108 07/28/17 0117   07/27/17 2145  ceFEPIme (MAXIPIME) 2 g in sodium chloride 0.9 % 100 mL IVPB     2 g 200 mL/hr over 30 Minutes Intravenous  Once 07/27/17 2134 07/27/17 2248   07/27/17 2145  vancomycin (VANCOCIN) IVPB 1000 mg/200 mL premix     1,000 mg 200 mL/hr over 60 Minutes Intravenous  Once 07/27/17 2134 07/28/17 0135        OBJECTIVE: Blood pressure (!) 128/39, pulse (!) 55, temperature 98.4 F (36.9 C), temperature source Oral, resp. rate 18, height 5' 3"  (1.6 m), weight 60.9 kg (134 lb 4.2 oz), SpO2 100 %.  Physical Exam  Constitutional: No distress.  HENT:  Mouth/Throat: No oropharyngeal exudate.  Eyes: EOM are normal.  Neck: Neck supple.  Cardiovascular: Normal rate, regular rhythm and normal heart sounds.  Pulmonary/Chest: Effort normal and breath sounds normal.  Abdominal: Soft. Bowel sounds are normal. There is no tenderness. There is no guarding.  Musculoskeletal: She exhibits no edema.       Arms: Lymphadenopathy:    She has no cervical adenopathy.  Neurological: She is  alert.    Lab Results Results for orders placed or performed during the hospital encounter of 07/27/17 (from the past 48 hour(s))  Comprehensive metabolic panel     Status: Abnormal   Collection Time: 07/27/17  7:11 PM  Result Value Ref Range   Sodium 139 135 - 145 mmol/L   Potassium 4.1 3.5 - 5.1 mmol/L   Chloride 101 101 - 111  mmol/L   CO2 25 22 - 32 mmol/L   Glucose, Bld 146 (H) 65 - 99 mg/dL   BUN 18 6 - 20 mg/dL   Creatinine, Ser 4.88 (H) 0.44 - 1.00 mg/dL   Calcium 8.3 (L) 8.9 - 10.3 mg/dL   Total Protein 7.5 6.5 - 8.1 g/dL   Albumin 2.9 (L) 3.5 - 5.0 g/dL   AST 47 (H) 15 - 41 U/L   ALT 39 14 - 54 U/L   Alkaline Phosphatase 124 38 - 126 U/L   Total Bilirubin 0.9 0.3 - 1.2 mg/dL   GFR calc non Af Amer 7 (L) >60 mL/min   GFR calc Af Amer 9 (L) >60 mL/min    Comment: (NOTE) The eGFR has been calculated using the CKD EPI equation. This calculation has not been validated in all clinical situations. eGFR's persistently <60 mL/min signify possible Chronic Kidney Disease.    Anion gap 13 5 - 15    Comment: Performed at [facility] [address] [place], [place] [address]  CBC with Differential     Status: Abnormal   Collection Time: 07/27/17  7:11 PM  Result Value Ref Range   WBC 12.0 (H) 4.0 - 10.5 K/uL   RBC 4.19 3.87 - 5.11 MIL/uL   Hemoglobin 11.8 (L) 12.0 - 15.0 g/dL   HCT 38.3 36.0 - 46.0 %   MCV 91.4 78.0 - 100.0 fL   MCH 28.2 26.0 - 34.0 pg   MCHC 30.8 30.0 - 36.0 g/dL   RDW 19.6 (H) 11.5 - 15.5 %   Platelets 277 150 - 400 K/uL   Neutrophils Relative % 84 %   Neutro Abs 10.2 (H) 1.7 - 7.7 K/uL   Lymphocytes Relative 6 %   Lymphs Abs 0.7 0.7 - 4.0 K/uL   Monocytes Relative 9 %   Monocytes Absolute 1.0 0.1 - 1.0 K/uL   Eosinophils Relative 1 %   Eosinophils Absolute 0.1 0.0 - 0.7 K/uL   Basophils Relative 0 %   Basophils Absolute 0.0 0.0 - 0.1 K/uL   Immature Granulocytes 0 %   Abs Immature Granulocytes 0.0 0.0 - 0.1 K/uL    Comment: Performed at  [facility], [address] [place], [place] [address]  Protime-INR     Status: None   Collection Time: 07/27/17  7:11 PM  Result Value Ref Range   Prothrombin Time 14.0 11.4 - 15.2 seconds   INR 1.09     Comment: Performed at [facility] [address], [place], [place] [address]  Culture, blood (Routine x 2)     Status: None (Preliminary result)   Collection Time: 07/27/17  7:13 PM  Result Value Ref Range   Specimen Description BLOOD RIGHT FOREARM    Special Requests      BOTTLES DRAWN AEROBIC AND ANAEROBIC Blood Culture adequate volume   Culture  Setup Time      GRAM POSITIVE COCCI IN BOTH AEROBIC AND ANAEROBIC BOTTLES CRITICAL RESULT CALLED TO, READ BACK BY AND VERIFIED WITH: PHARMD E [place] 07/28/17 1305 PM BY MORAC Performed at [facility], [place] [address], [place], [place] [address]    Culture GRAM POSITIVE COCCI    Report Status PENDING   Blood Culture ID Panel (Reflexed)     Status: Abnormal   Collection Time: 07/27/17  7:13 PM  Result Value Ref Range   Enterococcus species DETECTED (A) NOT DETECTED    Comment: CRITICAL RESULT CALLED TO,  READ BACK BY AND VERIFIED WITH: PHARMD E [place] 07/28/17 AT 1305 BY CM    Vancomycin resistance NOT DETECTED NOT DETECTED   Listeria monocytogenes NOT DETECTED NOT DETECTED   Staphylococcus species NOT DETECTED NOT DETECTED   Staphylococcus aureus NOT DETECTED NOT DETECTED   Streptococcus species NOT DETECTED NOT DETECTED   Streptococcus agalactiae NOT DETECTED NOT DETECTED   Streptococcus pneumoniae NOT DETECTED NOT DETECTED   Streptococcus pyogenes NOT DETECTED NOT DETECTED   Acinetobacter baumannii NOT DETECTED NOT DETECTED   Enterobacteriaceae species NOT DETECTED NOT DETECTED   Enterobacter cloacae complex NOT DETECTED NOT DETECTED   Escherichia coli NOT DETECTED NOT DETECTED   Klebsiella oxytoca NOT DETECTED NOT DETECTED   Klebsiella pneumoniae NOT DETECTED NOT DETECTED   Proteus species NOT DETECTED  NOT DETECTED   Serratia marcescens NOT DETECTED NOT DETECTED   Haemophilus influenzae NOT DETECTED NOT DETECTED   Neisseria meningitidis NOT DETECTED NOT DETECTED   Pseudomonas aeruginosa NOT DETECTED NOT DETECTED   Candida albicans NOT DETECTED NOT DETECTED   Candida glabrata NOT DETECTED NOT DETECTED   Candida krusei NOT DETECTED NOT DETECTED   Candida parapsilosis NOT DETECTED NOT DETECTED   Candida tropicalis NOT DETECTED NOT DETECTED    Comment: Performed at [facility], [address], [place], [place] [address]  Culture, blood (Routine x 2)     Status: None (Preliminary result)   Collection Time: 07/27/17  7:18 PM  Result Value Ref Range   Specimen Description BLOOD RIGHT HAND    Special Requests      BOTTLES DRAWN AEROBIC AND ANAEROBIC Blood Culture adequate volume   Culture  Setup Time      GRAM POSITIVE COCCI IN CHAINS IN BOTH AEROBIC AND ANAEROBIC BOTTLES Performed at [facility], [place] [address] [place], [place] [address]    Culture GRAM POSITIVE COCCI    Report Status PENDING   I-Stat CG4 Lactic Acid, ED     Status: None   Collection Time: 07/27/17  7:24 PM  Result Value Ref Range   Lactic Acid, Venous 1.72 0.5 - 1.9 mmol/L  I-stat troponin, ED     Status: None   Collection Time: 07/27/17 10:25 PM  Result Value Ref Range   Troponin i, poc 0.05 0.00 - 0.08 ng/mL   Comment 3            Comment: Due to the release kinetics of cTnI, a negative result within the first hours of the onset of symptoms does not rule out myocardial infarction with certainty. If myocardial infarction is still suspected, repeat the test at appropriate intervals.   POC CBG, ED     Status: Abnormal   Collection Time: 07/27/17 11:49 PM  Result Value Ref Range   Glucose-Capillary 257 (H) 65 - 99 mg/dL  MRSA PCR Screening     Status: Abnormal   Collection Time: 07/28/17  1:28 AM  Result Value Ref Range   MRSA by PCR POSITIVE (A) NEGATIVE    Comment:        The GeneXpert MRSA  Assay (FDA approved for NASAL specimens only), is one component of a comprehensive MRSA colonization surveillance program. It is not intended to diagnose MRSA infection nor to guide or monitor treatment for MRSA infections. RESULT CALLED TO, READ BACK BY AND VERIFIED WITH: Gypsy Lore RN 07/28/17 0358 JDW Performed at [facility], [place] [address] [place], [place] [address]   Glucose, capillary     Status: Abnormal  Collection Time: 07/28/17  1:42 AM  Result Value Ref Range   Glucose-Capillary 203 (H) 65 - 99 mg/dL  CBC with Differential     Status: Abnormal   Collection Time: 07/28/17  2:03 AM  Result Value Ref Range   WBC 9.5 4.0 - 10.5 K/uL   RBC 3.89 3.87 - 5.11 MIL/uL   Hemoglobin 10.8 (L) 12.0 - 15.0 g/dL   HCT 35.9 (L) 36.0 - 46.0 %   MCV 92.3 78.0 - 100.0 fL   MCH 27.8 26.0 - 34.0 pg   MCHC 30.1 30.0 - 36.0 g/dL   RDW 19.7 (H) 11.5 - 15.5 %   Platelets 247 150 - 400 K/uL   Neutrophils Relative % 79 %   Neutro Abs 7.4 1.7 - 7.7 K/uL   Lymphocytes Relative 12 %   Lymphs Abs 1.2 0.7 - 4.0 K/uL   Monocytes Relative 8 %   Monocytes Absolute 0.8 0.1 - 1.0 K/uL   Eosinophils Relative 1 %   Eosinophils Absolute 0.1 0.0 - 0.7 K/uL   Basophils Relative 0 %   Basophils Absolute 0.0 0.0 - 0.1 K/uL   Immature Granulocytes 0 %   Abs Immature Granulocytes 0.0 0.0 - 0.1 K/uL    Comment: Performed at [facility], [address] [place], [place] [address]  Comprehensive metabolic panel     Status: Abnormal   Collection Time: 07/28/17  2:03 AM  Result Value Ref Range   Sodium 139 135 - 145 mmol/L   Potassium 4.1 3.5 - 5.1 mmol/L   Chloride 100 (L) 101 - 111 mmol/L   CO2 25 22 - 32 mmol/L   Glucose, Bld 197 (H) 65 - 99 mg/dL   BUN 23 (H) 6 - 20 mg/dL   Creatinine, Ser 5.38 (H) 0.44 - 1.00 mg/dL   Calcium 8.0 (L) 8.9 - 10.3 mg/dL   Total Protein 6.9 6.5 - 8.1 g/dL   Albumin 2.7 (L) 3.5 - 5.0 g/dL   AST 54 (H) 15 - 41 U/L   ALT 43 14 - 54 U/L   Alkaline  Phosphatase 113 38 - 126 U/L   Total Bilirubin 0.7 0.3 - 1.2 mg/dL   GFR calc non Af Amer 7 (L) >60 mL/min   GFR calc Af Amer 8 (L) >60 mL/min    Comment: (NOTE) The eGFR has been calculated using the CKD EPI equation. This calculation has not been validated in all clinical situations. eGFR's persistently <60 mL/min signify possible Chronic Kidney Disease.    Anion gap 14 5 - 15    Comment: Performed at [facility] [address] [place], [place] [address]  Lactic acid, plasma     Status: None   Collection Time: 07/28/17  2:03 AM  Result Value Ref Range   Lactic Acid, Venous 1.8 0.5 - 1.9 mmol/L    Comment: Performed at [facility] [address], [place], [place] [address]  Procalcitonin     Status: None   Collection Time: 07/28/17  2:03 AM  Result Value Ref Range   Procalcitonin 1.89 ng/mL    Comment:        Interpretation: PCT > 0.5 ng/mL and <= 2 ng/mL: Systemic infection (sepsis) is possible, but other conditions are known to elevate PCT as well. (NOTE)       Sepsis PCT Algorithm           Lower Respiratory Tract  Infection PCT Algorithm    ----------------------------     ----------------------------         PCT < 0.25 ng/mL                PCT < 0.10 ng/mL         Strongly encourage             Strongly discourage   discontinuation of antibiotics    initiation of antibiotics    ----------------------------     -----------------------------       PCT 0.25 - 0.50 ng/mL            PCT 0.10 - 0.25 ng/mL               OR       >80% decrease in PCT            Discourage initiation of                                            antibiotics      Encourage discontinuation           of antibiotics    ----------------------------     -----------------------------         PCT >= 0.50 ng/mL              PCT 0.26 - 0.50 ng/mL                AND       <80% decrease in PCT             Encourage initiation of                                              antibiotics       Encourage continuation           of antibiotics    ----------------------------     -----------------------------        PCT >= 0.50 ng/mL                  PCT > 0.50 ng/mL               AND         increase in PCT                  Strongly encourage                                      initiation of antibiotics    Strongly encourage escalation           of antibiotics                                     -----------------------------                                           PCT <= 0.25 ng/mL  OR                                        > 80% decrease in PCT                                     Discontinue / Do not initiate                                             antibiotics Performed at [facility], [place] [address] [place], [place] [address]   Troponin I     Status: Abnormal   Collection Time: 07/28/17  2:03 AM  Result Value Ref Range   Troponin I 0.07 (HH) <0.03 ng/mL    Comment: CRITICAL RESULT CALLED TO, READ BACK BY AND VERIFIED WITH: D.BENTON,RN 0347 07/28/17 M.CAMPBELL Performed at [facility], [place] [address], [place], [place] [address]   CK     Status: None   Collection Time: 07/28/17  2:03 AM  Result Value Ref Range   Total CK 129 38 - 234 U/L    Comment: Performed at [facility], [place] [address], [place], [place] [address]  TSH     Status: None   Collection Time: 07/28/17  2:03 AM  Result Value Ref Range   TSH 1.590 0.350 - 4.500 uIU/mL    Comment: Performed by a 3rd Generation assay with a functional sensitivity of <=0.01 uIU/mL. Performed at [facility], [place] [address], [place], [place] [address]   Glucose, capillary     Status: None   Collection Time: 07/28/17  8:05 AM  Result Value Ref Range   Glucose-Capillary 93 65 - 99 mg/dL  Glucose, capillary     Status: Abnormal   Collection Time: 07/28/17 11:28 AM  Result Value Ref Range    Glucose-Capillary 165 (H) 65 - 99 mg/dL      Component Value Date/Time   SDES BLOOD RIGHT HAND 07/27/2017 1918   SPECREQUEST  07/27/2017 1918    BOTTLES DRAWN AEROBIC AND ANAEROBIC Blood Culture adequate volume   CULT GRAM POSITIVE COCCI 07/27/2017 1918   REPTSTATUS PENDING 07/27/2017 1918   Dg Chest 2 View  Result Date: 07/27/2017 CLINICAL DATA:  Acute onset of generalized weakness and fever. EXAM: CHEST - 2 VIEW COMPARISON:  Chest radiograph performed 06/07/2017 FINDINGS: The lungs are well-aerated. Vascular congestion is noted. Increased interstitial markings raise concern for pulmonary edema. Small bilateral pleural effusions are suspected. Chronic parenchymal changes are again noted at the right lung base. No pneumothorax is seen. The heart is borderline enlarged. No acute osseous abnormalities are seen. IMPRESSION: Vascular congestion and borderline cardiomegaly. Increased interstitial markings raise concern for pulmonary edema. Small bilateral pleural effusions suspected. Electronically Signed   By: Garald Balding M.D.   On: 07/27/2017 21:11   Ct Head Wo Contrast  Result Date: 07/27/2017 CLINICAL DATA:  Weakness after leaving dialysis today. History of stroke, hypertension and diabetes. EXAM: CT HEAD WITHOUT CONTRAST TECHNIQUE: Contiguous axial images were obtained from the base of the skull through the vertex without intravenous contrast. COMPARISON:  CT HEAD Jul 30, 2015 FINDINGS: BRAIN: No intraparenchymal hemorrhage, mass effect nor midline shift.  The ventricles and sulci are normal for age. Patchy supratentorial white matter hypodensities less than expected for patient's age, though non-specific are most compatible with chronic small vessel ischemic disease. No acute large vascular territory infarcts. No abnormal extra-axial fluid collections. Basal cisterns are patent. VASCULAR: Moderate to severe calcific atherosclerosis of the carotid siphons. SKULL: No skull fracture. No significant  scalp soft tissue swelling. Partially empty sella. SINUSES/ORBITS: The mastoid air-cells and included paranasal sinuses are well-aerated.The included ocular globes and orbital contents are non-suspicious. Status post bilateral ocular lens implants. OTHER: None. IMPRESSION: Negative noncontrast CT HEAD for age. Electronically Signed   By: Elon Alas M.D.   On: 07/27/2017 22:53   Recent Results (from the past 240 hour(s))  Culture, blood (Routine x 2)     Status: None (Preliminary result)   Collection Time: 07/27/17  7:13 PM  Result Value Ref Range Status   Specimen Description BLOOD RIGHT FOREARM  Final   Special Requests   Final    BOTTLES DRAWN AEROBIC AND ANAEROBIC Blood Culture adequate volume   Culture  Setup Time   Final    GRAM POSITIVE COCCI IN BOTH AEROBIC AND ANAEROBIC BOTTLES CRITICAL RESULT CALLED TO, READ BACK BY AND VERIFIED WITH: PHARMD E [place] 07/28/17 1305 PM BY MORAC Performed at [facility], [place] [address] [place], [place] [address]    Culture GRAM POSITIVE COCCI  Final   Report Status PENDING  Incomplete  Blood Culture ID Panel (Reflexed)     Status: Abnormal   Collection Time: 07/27/17  7:13 PM  Result Value Ref Range Status   Enterococcus species DETECTED (A) NOT DETECTED Final    Comment: CRITICAL RESULT CALLED TO, READ BACK BY AND VERIFIED WITH: PHARMD E [place] 07/28/17 AT 1305 BY CM    Vancomycin resistance NOT DETECTED NOT DETECTED Final   Listeria monocytogenes NOT DETECTED NOT DETECTED Final   Staphylococcus species NOT DETECTED NOT DETECTED Final   Staphylococcus aureus NOT DETECTED NOT DETECTED Final   Streptococcus species NOT DETECTED NOT DETECTED Final   Streptococcus agalactiae NOT DETECTED NOT DETECTED Final   Streptococcus pneumoniae NOT DETECTED NOT DETECTED Final   Streptococcus pyogenes NOT DETECTED NOT DETECTED Final   Acinetobacter baumannii NOT DETECTED NOT DETECTED Final   Enterobacteriaceae species NOT DETECTED NOT DETECTED  Final   Enterobacter cloacae complex NOT DETECTED NOT DETECTED Final   Escherichia coli NOT DETECTED NOT DETECTED Final   Klebsiella oxytoca NOT DETECTED NOT DETECTED Final   Klebsiella pneumoniae NOT DETECTED NOT DETECTED Final   Proteus species NOT DETECTED NOT DETECTED Final   Serratia marcescens NOT DETECTED NOT DETECTED Final   Haemophilus influenzae NOT DETECTED NOT DETECTED Final   Neisseria meningitidis NOT DETECTED NOT DETECTED Final   Pseudomonas aeruginosa NOT DETECTED NOT DETECTED Final   Candida albicans NOT DETECTED NOT DETECTED Final   Candida glabrata NOT DETECTED NOT DETECTED Final   Candida krusei NOT DETECTED NOT DETECTED Final   Candida parapsilosis NOT DETECTED NOT DETECTED Final   Candida tropicalis NOT DETECTED NOT DETECTED Final    Comment: Performed at [facility], [address] [place], [place] [address]  Culture, blood (Routine x 2)     Status: None (Preliminary result)   Collection Time: 07/27/17  7:18 PM  Result Value Ref Range Status   Specimen Description BLOOD RIGHT HAND  Final   Special Requests   Final    BOTTLES DRAWN AEROBIC AND ANAEROBIC Blood Culture adequate volume  Culture  Setup Time   Final    GRAM POSITIVE COCCI IN CHAINS IN BOTH AEROBIC AND ANAEROBIC BOTTLES Performed at [facility], [place] [address] [place], [place] [address]    Culture GRAM POSITIVE COCCI  Final   Report Status PENDING  Incomplete  MRSA PCR Screening     Status: Abnormal   Collection Time: 07/28/17  1:28 AM  Result Value Ref Range Status   MRSA by PCR POSITIVE (A) NEGATIVE Final    Comment:        The GeneXpert MRSA Assay (FDA approved for NASAL specimens only), is one component of a comprehensive MRSA colonization surveillance program. It is not intended to diagnose MRSA infection nor to guide or monitor treatment for MRSA infections. RESULT CALLED TO, READ BACK BY AND VERIFIED WITH: Gypsy Lore RN 07/28/17 0358 JDW Performed at [facility], [place] [address] [place], [place] [address]     Microbiology: Recent Results (from the past 240 hour(s))  Culture, blood (Routine x 2)     Status: None (Preliminary result)   Collection Time: 07/27/17  7:13 PM  Result Value Ref Range Status   Specimen Description BLOOD RIGHT FOREARM  Final   Special Requests   Final    BOTTLES DRAWN AEROBIC AND ANAEROBIC Blood Culture adequate volume   Culture  Setup Time   Final    GRAM POSITIVE COCCI IN BOTH AEROBIC AND ANAEROBIC BOTTLES CRITICAL RESULT CALLED TO, READ BACK BY AND VERIFIED WITH: PHARMD E [place] 07/28/17 1305 PM BY MORAC Performed at [facility], [place] [address], [place], [place] [address]    Culture GRAM POSITIVE COCCI  Final   Report Status PENDING  Incomplete  Blood Culture ID Panel (Reflexed)     Status: Abnormal   Collection Time: 07/27/17  7:13 PM  Result Value Ref Range Status   Enterococcus species DETECTED (A) NOT DETECTED Final    Comment: CRITICAL RESULT CALLED TO, READ BACK BY AND VERIFIED WITH: PHARMD E SINCLARE 07/28/17 AT 1305 BY CM    Vancomycin resistance NOT DETECTED NOT DETECTED Final   Listeria monocytogenes NOT DETECTED NOT DETECTED Final   Staphylococcus species NOT DETECTED NOT DETECTED Final   Staphylococcus aureus NOT DETECTED NOT DETECTED Final   Streptococcus species NOT DETECTED NOT DETECTED Final   Streptococcus agalactiae NOT DETECTED NOT DETECTED Final   Streptococcus pneumoniae NOT DETECTED NOT DETECTED Final   Streptococcus pyogenes NOT DETECTED NOT DETECTED Final   Acinetobacter baumannii NOT DETECTED NOT DETECTED Final   Enterobacteriaceae species NOT DETECTED NOT DETECTED Final   Enterobacter cloacae complex NOT DETECTED NOT DETECTED Final   Escherichia coli NOT DETECTED NOT DETECTED Final   Klebsiella oxytoca NOT DETECTED NOT DETECTED Final   Klebsiella pneumoniae NOT DETECTED NOT DETECTED Final   Proteus species NOT DETECTED NOT DETECTED Final   Serratia marcescens NOT DETECTED NOT  DETECTED Final   Haemophilus influenzae NOT DETECTED NOT DETECTED Final   Neisseria meningitidis NOT DETECTED NOT DETECTED Final   Pseudomonas aeruginosa NOT DETECTED NOT DETECTED Final   Candida albicans NOT DETECTED NOT DETECTED Final   Candida glabrata NOT DETECTED NOT DETECTED Final   Candida krusei NOT DETECTED NOT DETECTED Final   Candida parapsilosis NOT DETECTED NOT DETECTED Final   Candida tropicalis NOT DETECTED NOT DETECTED Final    Comment: Performed at [facility], [address] [place],  [address]  Culture, blood (Routine x 2)     Status:  None (Preliminary result)   Collection Time: 07/27/17  7:18 PM  Result Value Ref Range Status   Specimen Description BLOOD RIGHT HAND  Final   Special Requests   Final    BOTTLES DRAWN AEROBIC AND ANAEROBIC Blood Culture adequate volume   Culture  Setup Time   Final    GRAM POSITIVE COCCI IN CHAINS IN BOTH AEROBIC AND ANAEROBIC BOTTLES Performed at [facility], [place] [address], [place], [place] [address]    Culture GRAM POSITIVE COCCI  Final   Report Status PENDING  Incomplete  MRSA PCR Screening     Status: Abnormal   Collection Time: 07/28/17  1:28 AM  Result Value Ref Range Status   MRSA by PCR POSITIVE (A) NEGATIVE Final    Comment:        The GeneXpert MRSA Assay (FDA approved for NASAL specimens only), is one component of a comprehensive MRSA colonization surveillance program. It is not intended to diagnose MRSA infection nor to guide or monitor treatment for MRSA infections. RESULT CALLED TO, READ BACK BY AND VERIFIED WITH: Gypsy Lore RN 07/28/17 0358 JDW Performed at [facility], [place] [address] [place], [place] [address]     Radiographs and labs were personally reviewed by me.   Bobby Rumpf, MD [facility] [phone] 07/28/2017, 2:15 PM

## 2017-07-28 NOTE — Progress Notes (Signed)
Pharmacy Antibiotic Note  Nicole Cox is a 84 y.o. female admitted on 07/27/2017 with weakness/possible sepsis.  PT with ESRD on HD TTSat.   Pharmacy has been consulted for Vancomycin and Zosyn  Dosing.  Vancomycin 1 g IV given in ED at  2145  Plan: Vancomycin 500 mg IV after each HD Zosyn 3.375 g IV q12h   Height:  (160 cm) Weight: 130 lb (59 kg) IBW/kg (Calculated) : 52.4  Temp (24hrs), Avg:99.9 F (37.7 C), Min:98.7 F (37.1 C), Max:102 F (38.9 C)  Recent Labs  Lab 07/27/17 1911 07/27/17 1924  WBC 12.0*  --   CREATININE 4.88*  --   LATICACIDVEN  --  1.72    Estimated Creatinine Clearance: 7.2 mL/min (A) (by C-G formula based on SCr of 4.88 mg/dL (H)).    No Known Allergies  Eddie Candle 07/28/2017 1:15 AM

## 2017-07-28 NOTE — Progress Notes (Signed)
After asking pt about her belongings. Pt named her clothing and wallet as the only things she brought with her. Pt stated 'the girl who took off my shirt and put me in a gown, took my wallet and said that she would put it up. The nurse called prior nurse in ED and advised them of the misplaced wallet. Was told by prior nurse that they did not find a wallet and that they would contact the EMS workers to see if the wallet was left there.

## 2017-07-28 NOTE — H&P (Signed)
History and Physical    Andreia Gandolfi ZOX:[id number] DOB: 1932-03-08 DOA: 07/27/2017  PCP: Joana Reamer, MD  Patient coming from: Home.  History obtained from patient's daughter and patient in the ER physician.  Chief Complaint: Weakness.  HPI: Nicole Cox is a 84 y.o. female with history of ESRD on hemodialysis, hypertension, CAD, diabetes mellitus and previous stroke was brought to the ER after patient was found to be weak.  As per the daughter patient has been getting weak last 2 days.  Patient goes to dialysis on the bus and following dialysis patient is to be carried back to home because of weakness.  EMS was called and patient was brought to the ER.  Patient denies any headache visual symptoms any focal deficits chest pain shortness of breath nausea vomiting abdominal pain diarrhea.  Denies any change in the medications.  ED Course: In the ER patient is found to be having a fever of 5 F source is not clear.  Chest x-ray unremarkable.  On exam patient appears nonfocal and is eating.  Patient admitted for further observation for SIRS with weakness.  CT of the head was unremarkable.  Review of Systems: As per HPI, rest all negative.   Past Medical History:  Diagnosis Date  . CHF (congestive heart failure) (HCC)   . COPD (chronic obstructive pulmonary disease) (HCC)   . Coronary artery disease   . Diabetes mellitus without complication (HCC)   . Heart disease   . Hypertension   . Renal disorder   . Stroke [facility])     Past Surgical History:  Procedure Laterality Date  . ABDOMINAL HYSTERECTOMY    . CHOLECYSTECTOMY    . TONSILLECTOMY       reports that she has quit smoking. She has never used smokeless tobacco. She reports that she does not drink alcohol or use drugs.  No Known Allergies  Family History  Problem Relation Age of Onset  . Heart disease Mother   . Hypertension Mother   . Heart disease Father   . Hypertension Father   . Heart disease Sister   .  Diabetes Sister   . Stroke Sister   . Kidney disease Sister   . Hypertension Sister   . Heart disease Sister   . Kidney disease Sister   . Hypertension Sister   . Kidney disease Daughter   . Hypertension Daughter     Prior to Admission medications   Medication Sig Start Date End Date Taking? Authorizing Provider  albuterol (PROVENTIL HFA;VENTOLIN HFA) 108 (90 Base) MCG/ACT inhaler Inhale 2 puffs into the lungs every 6 (six) hours as needed for wheezing or shortness of breath. 08/17/15   Edsel Petrin, DO  calcium acetate (PHOSLO) 667 MG capsule Take 2 capsules (1,334 mg total) by mouth 3 (three) times daily with meals. 06/09/17   Regalado, Belkys A, MD  calcium acetate (PHOSLO) 667 MG capsule Take 1 capsule (667 mg total) by mouth with snacks. 06/09/17   Regalado, Belkys A, MD  Cholecalciferol (VITAMIN D3) 1000 units CAPS Take 2,000 Units by mouth every other day.    [provider]  cloNIDine (CATAPRES - DOSED IN MG/24 HR) 0.3 mg/24hr patch Place 1 patch onto the skin every 7 (seven) days. 07/18/17   [provider]  fluticasone furoate-vilanterol (BREO ELLIPTA) 100-25 MCG/INH AEPB Inhale 1 puff into the lungs daily. 11/10/15   [provider]  insulin glargine (LANTUS) 100 UNIT/ML injection Inject 0.05 mLs (5 Units total) into the skin  at bedtime. 06/09/17   Regalado, Belkys A, MD  isosorbide mononitrate (IMDUR) 30 MG 24 hr tablet Take 30 mg by mouth every evening. 03/21/17   [provider]  lidocaine-prilocaine (EMLA) cream Apply 1 application topically every other day. 10/21/15   [provider]  lisinopril (PRINIVIL,ZESTRIL) 40 MG tablet Take 40 mg by mouth every evening. 12/02/15   [provider]  metoprolol tartrate (LOPRESSOR) 25 MG tablet Take 0.5 tablets (12.5 mg total) by mouth 2 (two) times daily. 06/10/17   Regalado, Belkys A, MD  NIFEdipine (PROCARDIA XL/ADALAT-CC) 60 MG 24 hr tablet Take 60 mg by mouth 2 (two) times daily. 12/03/15    [provider]  nitroGLYCERIN (NITROSTAT) 0.4 MG SL tablet Place 0.4 mg under the tongue See admin instructions. Mat repeat every 5 minutes. If third tab needed call 911 06/23/17   [provider]  Nutritional Supplements (FEEDING SUPPLEMENT, NEPRO CARB STEADY,) LIQD Take 237 mLs by mouth 2 (two) times daily between meals. 06/09/17   Regalado, Belkys A, MD  pravastatin (PRAVACHOL) 40 MG tablet Take 40 mg by mouth daily.    [provider]  tiotropium (SPIRIVA HANDIHALER) 18 MCG inhalation capsule Place 1 capsule into inhaler and inhale daily. 08/10/14   [provider]  traMADol (ULTRAM) 50 MG tablet Take 50 mg by mouth every 8 (eight) hours as needed for pain.     [provider]  traZODone (DESYREL) 100 MG tablet Take 50-100 mg by mouth at bedtime as needed for sleep. 07/19/17   [provider]    Physical Exam: Vitals:   07/27/17 2130 07/27/17 2200 07/28/17 0007 07/28/17 0011  BP: (!) 117/42 126/81 (!) 114/43 (!) 114/42  Pulse:   64 64  Resp:   (!) 21   Temp:   98.7 F (37.1 C)   TempSrc:   Oral   SpO2:   99% 100%  Weight:      Height:          Constitutional: Moderately built and nourished. Vitals:   07/27/17 2130 07/27/17 2200 07/28/17 0007 07/28/17 0011  BP: (!) 117/42 126/81 (!) 114/43 (!) 114/42  Pulse:   64 64  Resp:   (!) 21   Temp:   98.7 F (37.1 C)   TempSrc:   Oral   SpO2:   99% 100%  Weight:      Height:       Eyes: Anicteric no pallor. ENMT: No discharge from the ears eyes nose or mouth. Neck: No mass felt.  No neck rigidity.  No JVD appreciated. Respiratory: No rhonchi or crepitations. Cardiovascular: S1-S2 heard no murmurs appreciated. Abdomen: Soft nontender bowel sounds present. Musculoskeletal: No edema.  No joint effusion. Skin: No rash.  Sacral decubitus ulcer history patient states is completely healed. Neurologic: Alert awake oriented to time place and person.  Moves all extremities 5 x 5.  No  facial asymmetry tongue is midline.  Pupils equal and reacting to light. Psychiatric: Appears normal.  Normal affect.   Labs on Admission: I have personally reviewed following labs and imaging studies  CBC: Recent Labs  Lab 07/27/17 1911  WBC 12.0*  NEUTROABS 10.2*  HGB 11.8*  HCT 38.3  MCV 91.4  PLT 277   Basic Metabolic Panel: Recent Labs  Lab 07/27/17 1911  NA 139  K 4.1  CL 101  CO2 25  GLUCOSE 146*  BUN 18  CREATININE 4.88*  CALCIUM 8.3*   GFR: Estimated Creatinine Clearance: 7.2 mL/min (A) (  by C-G formula based on SCr of 4.88 mg/dL (H)). Liver Function Tests: Recent Labs  Lab 07/27/17 1911  AST 47*  ALT 39  ALKPHOS 124  BILITOT 0.9  PROT 7.5  ALBUMIN 2.9*   No results for input(s): LIPASE, AMYLASE in the last 168 hours. No results for input(s): AMMONIA in the last 168 hours. Coagulation Profile: Recent Labs  Lab 07/27/17 1911  INR 1.09   Cardiac Enzymes: No results for input(s): CKTOTAL, CKMB, CKMBINDEX, TROPONINI in the last 168 hours. BNP (last 3 results) No results for input(s): PROBNP in the last 8760 hours. HbA1C: No results for input(s): HGBA1C in the last 72 hours. CBG: Recent Labs  Lab 07/27/17 2349  GLUCAP 257*   Lipid Profile: No results for input(s): CHOL, HDL, LDLCALC, TRIG, CHOLHDL, LDLDIRECT in the last 72 hours. Thyroid Function Tests: No results for input(s): TSH, T4TOTAL, FREET4, T3FREE, THYROIDAB in the last 72 hours. Anemia Panel: No results for input(s): VITAMINB12, FOLATE, FERRITIN, TIBC, IRON, RETICCTPCT in the last 72 hours. Urine analysis:    Component Value Date/Time   COLORURINE YELLOW 08/11/2012 1917   APPEARANCEUR CLOUDY (A) 08/11/2012 1917   LABSPEC 1.013 08/11/2012 1917   PHURINE 7.5 08/11/2012 1917   GLUCOSEU 100 (A) 08/11/2012 1917   HGBUR TRACE (A) 08/11/2012 1917   BILIRUBINUR NEGATIVE 08/11/2012 1917   KETONESUR NEGATIVE 08/11/2012 1917   PROTEINUR >300 (A) 08/11/2012 1917   UROBILINOGEN 0.2  08/11/2012 1917   NITRITE NEGATIVE 08/11/2012 1917   LEUKOCYTESUR NEGATIVE 08/11/2012 1917   Sepsis Labs: (procalcitonin:4,lacticidven:4) )No results found for this or any previous visit (from the past 240 hour(s)).   Radiological Exams on Admission: Dg Chest 2 View  Result Date: 07/27/2017 CLINICAL DATA:  Acute onset of generalized weakness and fever. EXAM: CHEST - 2 VIEW COMPARISON:  Chest radiograph performed 06/07/2017 FINDINGS: The lungs are well-aerated. Vascular congestion is noted. Increased interstitial markings raise concern for pulmonary edema. Small bilateral pleural effusions are suspected. Chronic parenchymal changes are again noted at the right lung base. No pneumothorax is seen. The heart is borderline enlarged. No acute osseous abnormalities are seen. IMPRESSION: Vascular congestion and borderline cardiomegaly. Increased interstitial markings raise concern for pulmonary edema. Small bilateral pleural effusions suspected. Electronically Signed   By: Roanna Raider M.D.   On: 07/27/2017 21:11   Ct Head Wo Contrast  Result Date: 07/27/2017 CLINICAL DATA:  Weakness after leaving dialysis today. History of stroke, hypertension and diabetes. EXAM: CT HEAD WITHOUT CONTRAST TECHNIQUE: Contiguous axial images were obtained from the base of the skull through the vertex without intravenous contrast. COMPARISON:  CT HEAD Jul 30, 2015 FINDINGS: BRAIN: No intraparenchymal hemorrhage, mass effect nor midline shift. The ventricles and sulci are normal for age. Patchy supratentorial white matter hypodensities less than expected for patient's age, though non-specific are most compatible with chronic small vessel ischemic disease. No acute large vascular territory infarcts. No abnormal extra-axial fluid collections. Basal cisterns are patent. VASCULAR: Moderate to severe calcific atherosclerosis of the carotid siphons. SKULL: No skull fracture. No significant scalp soft tissue swelling.  Partially empty sella. SINUSES/ORBITS: The mastoid air-cells and included paranasal sinuses are well-aerated.The included ocular globes and orbital contents are non-suspicious. Status post bilateral ocular lens implants. OTHER: None. IMPRESSION: Negative noncontrast CT HEAD for age. Electronically Signed   By: Awilda Metro M.D.   On: 07/27/2017 22:53    EKG: Independently reviewed.  Normal sinus rhythm LVH.  Assessment/Plan Principal Problem:   SIRS (systemic inflammatory response syndrome) (HCC)  Active Problems:   Hypertension   DM (diabetes mellitus), type 2 with renal complications (HCC)   CAD (coronary artery disease)   ESRD on hemodialysis (HCC)   Chronic anemia    1. SIRS with weakness -source not clear.  Blood cultures have been obtained.  Patient immediately started on antibiotics.  We will get physical therapy consult.  Observe on telemetry we will cycle cardiac markers check CK levels TSH for weakness. 2. ESRD on hemodialysis on Tuesday Thursday and Saturday.  Has had dialysis yesterday.  Closely follow respiratory status and metabolic panel. 3. Diabetes mellitus type 2 -on Lantus insulin 5 units at bedtime with sliding scale coverage at this time.  Patient has had previous episodes of hypoglycemia so closely follow CBGs. 4. Hypertension on clonidine patch, nifedipine lisinopril, Imdur and metoprolol. 5. Anemia likely from renal disease follow CBC. 6. History of CAD denies any chest pain.  On statins beta-blockers Imdur. 7. Previous history of stroke presently appears nonfocal CT head unremarkable.   DVT prophylaxis: Heparin. Code Status: Full code. Family Communication: Patient's daughter. Disposition Plan: Home. Consults called: Physical therapy. Admission status: Observation.   Eduard Clos MD [facility] Pager [phone].  If 7PM-7AM, please contact night-coverage [email or web address] Password [email or web address]  07/28/2017, 1:09 AM

## 2017-07-28 NOTE — Progress Notes (Signed)
CRITICAL VALUE ALERT  Critical Value:  Trop-0.07 MD made aware. Awaiting orders. Will continue to monitor.

## 2017-07-28 NOTE — Progress Notes (Signed)
PHARMACY - PHYSICIAN COMMUNICATION CRITICAL VALUE ALERT - BLOOD CULTURE IDENTIFICATION (BCID)  Nicole Cox is an 85 y.o. female who presented to [facility] on 07/27/2017 with a chief complaint of weakness.   Assessment:  84 year old female with ESRD. Found to have enterococcus in 4/4 blood cultures. Vancomycin resistance was not detected. WBC within normal limits today and Tmax- 102.   Name of physician (or Provider) Contacted: Shahmehdi  Current antibiotics: Vanc/Zosyn  Changes to prescribed antibiotics recommended:  Continue Vancomycin and await susceptibilities. Discontinue Zosyn.  Made changes per [facility] protocol and paged MD.    Results for orders placed or performed during the hospital encounter of 07/27/17  Blood Culture ID Panel (Reflexed) (Collected: 07/27/2017  7:13 PM)  Result Value Ref Range   Enterococcus species DETECTED (A) NOT DETECTED   Vancomycin resistance NOT DETECTED NOT DETECTED   Listeria monocytogenes NOT DETECTED NOT DETECTED   Staphylococcus species NOT DETECTED NOT DETECTED   Staphylococcus aureus NOT DETECTED NOT DETECTED   Streptococcus species NOT DETECTED NOT DETECTED   Streptococcus agalactiae NOT DETECTED NOT DETECTED   Streptococcus pneumoniae NOT DETECTED NOT DETECTED   Streptococcus pyogenes NOT DETECTED NOT DETECTED   Acinetobacter baumannii NOT DETECTED NOT DETECTED   Enterobacteriaceae species NOT DETECTED NOT DETECTED   Enterobacter cloacae complex NOT DETECTED NOT DETECTED   Escherichia coli NOT DETECTED NOT DETECTED   Klebsiella oxytoca NOT DETECTED NOT DETECTED   Klebsiella pneumoniae NOT DETECTED NOT DETECTED   Proteus species NOT DETECTED NOT DETECTED   Serratia marcescens NOT DETECTED NOT DETECTED   Haemophilus influenzae NOT DETECTED NOT DETECTED   Neisseria meningitidis NOT DETECTED NOT DETECTED   Pseudomonas aeruginosa NOT DETECTED NOT DETECTED   Candida albicans NOT DETECTED NOT DETECTED   Candida glabrata NOT DETECTED NOT  DETECTED   Candida krusei NOT DETECTED NOT DETECTED   Candida parapsilosis NOT DETECTED NOT DETECTED   Candida tropicalis NOT DETECTED NOT DETECTED    Sharin Mons, PharmD, BCPS PGY2 Infectious Diseases Pharmacy Resident Pager: [phone]  07/28/2017  1:10 PM

## 2017-07-28 NOTE — Care Management Obs Status (Signed)
MEDICARE OBSERVATION STATUS NOTIFICATION   Patient Details  Name: Nicole Cox MRN: [id number] Date of Birth: 10-20-1931   Medicare Observation Status Notification Given:  Yes    Lawerance Sabal, RN 07/28/2017, 3:51 PM

## 2017-07-28 NOTE — Progress Notes (Signed)
New Admission Note:  Arrival Method: Via stretcher from ED Mental Orientation: Alert & Oriented x3 Telemetry: CCMD verified.  Assessment: Completed Skin: refer to flowsheet IV: Right Forearm Pain: 0/10 Tubes: Safety Measures: Safety Fall Prevention Plan discussed with patient. Admission: Completed [facility] [place] Orientation: Patient has been orientated to the room, unit and the staff. Family: At bedside.  Orders have been reviewed and implemented. Will continue to monitor the patient. Call light has been placed within reach and bed alarm has been activated.   Aram Candela, RN  Phone Number: [phone]

## 2017-07-28 NOTE — Progress Notes (Signed)
PROGRESS NOTE    Patient: Nicole Cox     PCP: Joana Reamer, MD                    DOB: Feb 28, 1932            DOA: 07/27/2017 ZOX:[id number]             DOS: 07/28/2017, 1:20 PM   LOS: 0 days   Date of Service: The patient was seen and examined on 07/28/2017  Subjective:  Examined this morning, afebrile, normotensive.  Awake alert oriented, still complaining of diffuse generalized weaknesses. Headaches, visual changes or asymmetric weaknesses. ----------------------------------------------------------------------------------------------------------------------  Brief Narrative:  Nicole Cox is a 84 y.o. female with history of ESRD on hemodialysis, hypertension, CAD, diabetes mellitus and previous stroke was brought to the ER after patient was found to be weak.  As per the daughter patient has been getting weak last 2 days.  Following dialysis patient had progressive weakness was brought to the ED for further evaluation.  She was found to have a temperature of 102.0,  But the initial criteria for SIRS, borderline hypotensive.  Subsequently admitted, cultures were obtained, initiated on spectrum antibiotics  Principal Problem:   SIRS (systemic inflammatory response syndrome) (HCC) Active Problems:   Hypertension   DM (diabetes mellitus), type 2 with renal complications (HCC)   CAD (coronary artery disease)   ESRD on hemodialysis (HCC)   Chronic anemia   Assessment & Plan:   SIRS/possible bacteremia 1 out of 2 blood cultures growing coccus -Really stable, afebrile, normotensive -Admission patient was on vancomycin and Zosyn,  -DC Zosyn, continue vancomycin -I D consulted  ESRD - on hemodialysis on Tuesday Thursday and Saturday.  [place] - Has had dialysis yesterday.   -Closely follow respiratory status and metabolic panel. -Nephrology for hemodialysis possibly in a.m.  Diabetes mellitus type  II -on Lantus insulin 5 units at bedtime with sliding scale coverage at  this time.  Patient has had previous episodes of hypoglycemia so closely follow CBGs.  Hypertension - on clonidine patch, nifedipine lisinopril, Imdur and metoprolol.  History of chronic anemia/chronic disease, CKD -Monitoring H&H closely   History of CAD  -denies any chest pain.   -on statins beta-blockers Imdur.  Previous history of stroke  -presently appears nonfocal CT head unremarkable  Consultants:   Nephro Kathrene Bongo   ID   Procedures:  Dialysis    Antimicrobials:  Anti-infectives (From admission, onward)   Start     Dose/Rate Route Frequency Ordered Stop   07/29/17 1200  vancomycin (VANCOCIN) 500 mg in sodium chloride 0.9 % 100 mL IVPB     500 mg 100 mL/hr over 60 Minutes Intravenous Every T-Th-Sa (Hemodialysis) 07/28/17 0118     07/28/17 0130  piperacillin-tazobactam (ZOSYN) IVPB 3.375 g     3.375 g 12.5 mL/hr over 240 Minutes Intravenous Every 12 hours 07/28/17 0118     07/28/17 0115  piperacillin-tazobactam (ZOSYN) IVPB 3.375 g  Status:  Discontinued     3.375 g 100 mL/hr over 30 Minutes Intravenous  Once 07/28/17 0108 07/28/17 0117   07/27/17 2145  ceFEPIme (MAXIPIME) 2 g in sodium chloride 0.9 % 100 mL IVPB     2 g 200 mL/hr over 30 Minutes Intravenous  Once 07/27/17 2134 07/27/17 2248   07/27/17 2145  vancomycin (VANCOCIN) IVPB 1000 mg/200 mL premix     1,000 mg 200 mL/hr over 60 Minutes Intravenous  Once 07/27/17 2134 07/28/17 0135  Objective: Vitals:   07/28/17 0011 07/28/17 0142 07/28/17 0143 07/28/17 0808  BP: (!) 114/42 (!) 110/43  (!) 128/39  Pulse: 64 (!) 55  (!) 55  Resp:    18  Temp:    98.4 F (36.9 C)  TempSrc:    Oral  SpO2: 100% 100%  100%  Weight:   60.9 kg (134 lb 4.2 oz)   Height:        Intake/Output Summary (Last 24 hours) at 07/28/2017 1320 Last data filed at 07/28/2017 1001 Gross per 24 hour  Intake 830.2 ml  Output 0 ml  Net 830.2 ml   Filed Weights   07/27/17 1915 07/28/17 0007 07/28/17 0143  Weight: 59  kg (130 lb) 60.9 kg (134 lb 4.2 oz) 60.9 kg (134 lb 4.2 oz)    Examination:  General exam: Appears calm and comfortable  Psychiatry: Judgement and insight appear norma.. Mood & affect appropriate. HEENT: WNLs Respiratory system: Clear to auscultation. Respiratory effort normal. Cardiovascular system: S1 & S2 heard, RRR. No JVD, murmurs, rubs, gallops or clicks. No pedal edema. Gastrointestinal system: Abd. nondistended, soft and nontender. No organomegaly or masses felt. Normal bowel sounds heard. Central nervous system: Alert and oriented. No focal neurological deficits. Extremities: Generalized weaknesses, symmetric 5 x 5 power. Skin: No rashes, lesions or ulcers Arm fistula  Data Reviewed: I have personally reviewed following labs and imaging studies  CBC: Recent Labs  Lab 07/27/17 1911 07/28/17 0203  WBC 12.0* 9.5  NEUTROABS 10.2* 7.4  HGB 11.8* 10.8*  HCT 38.3 35.9*  MCV 91.4 92.3  PLT 277 247   Basic Metabolic Panel: Recent Labs  Lab 07/27/17 1911 07/28/17 0203  NA 139 139  K 4.1 4.1  CL 101 100*  CO2 25 25  GLUCOSE 146* 197*  BUN 18 23*  CREATININE 4.88* 5.38*  CALCIUM 8.3* 8.0*   GFR: Estimated Creatinine Clearance: 6.6 mL/min (A) (by C-G formula based on SCr of 5.38 mg/dL (H)). Liver Function Tests: Recent Labs  Lab 07/27/17 1911 07/28/17 0203  AST 47* 54*  ALT 39 43  ALKPHOS 124 113  BILITOT 0.9 0.7  PROT 7.5 6.9  ALBUMIN 2.9* 2.7*   No results for input(s): LIPASE, AMYLASE in the last 168 hours. No results for input(s): AMMONIA in the last 168 hours. Coagulation Profile: Recent Labs  Lab 07/27/17 1911  INR 1.09   Cardiac Enzymes: Recent Labs  Lab 07/28/17 0203  CKTOTAL 129  TROPONINI 0.07*   BNP (last 3 results) No results for input(s): PROBNP in the last 8760 hours. HbA1C: No results for input(s): HGBA1C in the last 72 hours. CBG: Recent Labs  Lab 07/27/17 2349 07/28/17 0142 07/28/17 0805 07/28/17 1128  GLUCAP 257* 203*  93 165*   Lipid Profile: No results for input(s): CHOL, HDL, LDLCALC, TRIG, CHOLHDL, LDLDIRECT in the last 72 hours. Thyroid Function Tests: Recent Labs    07/28/17 0203  TSH 1.590   Anemia Panel: No results for input(s): VITAMINB12, FOLATE, FERRITIN, TIBC, IRON, RETICCTPCT in the last 72 hours. Sepsis Labs: Recent Labs  Lab 07/27/17 1924 07/28/17 0203  PROCALCITON  --  1.89  LATICACIDVEN 1.72 1.8    Recent Results (from the past 240 hour(s))  Culture, blood (Routine x 2)     Status: None (Preliminary result)   Collection Time: 07/27/17  7:13 PM  Result Value Ref Range Status   Specimen Description BLOOD RIGHT FOREARM  Final   Special Requests   Final    BOTTLES DRAWN  AEROBIC AND ANAEROBIC Blood Culture adequate volume   Culture  Setup Time   Final    GRAM POSITIVE COCCI IN BOTH AEROBIC AND ANAEROBIC BOTTLES CRITICAL RESULT CALLED TO, READ BACK BY AND VERIFIED WITH: PHARMD E SINCLARE 07/28/17 1305 PM BY [facility] Performed at [facility], [address], [place], [place] [address]    Culture GRAM POSITIVE COCCI  Final   Report Status PENDING  Incomplete  Blood Culture ID Panel (Reflexed)     Status: Abnormal   Collection Time: 07/27/17  7:13 PM  Result Value Ref Range Status   Enterococcus species DETECTED (A) NOT DETECTED Final    Comment: CRITICAL RESULT CALLED TO, READ BACK BY AND VERIFIED WITH: PHARMD E SINCLARE 07/28/17 AT 1305 BY CM    Vancomycin resistance NOT DETECTED NOT DETECTED Final   Listeria monocytogenes NOT DETECTED NOT DETECTED Final   Staphylococcus species NOT DETECTED NOT DETECTED Final   Staphylococcus aureus NOT DETECTED NOT DETECTED Final   Streptococcus species NOT DETECTED NOT DETECTED Final   Streptococcus agalactiae NOT DETECTED NOT DETECTED Final   Streptococcus pneumoniae NOT DETECTED NOT DETECTED Final   Streptococcus pyogenes NOT DETECTED NOT DETECTED Final   Acinetobacter baumannii NOT DETECTED NOT DETECTED Final    Enterobacteriaceae species NOT DETECTED NOT DETECTED Final   Enterobacter cloacae complex NOT DETECTED NOT DETECTED Final   Escherichia coli NOT DETECTED NOT DETECTED Final   Klebsiella oxytoca NOT DETECTED NOT DETECTED Final   Klebsiella pneumoniae NOT DETECTED NOT DETECTED Final   Proteus species NOT DETECTED NOT DETECTED Final   Serratia marcescens NOT DETECTED NOT DETECTED Final   Haemophilus influenzae NOT DETECTED NOT DETECTED Final   Neisseria meningitidis NOT DETECTED NOT DETECTED Final   Pseudomonas aeruginosa NOT DETECTED NOT DETECTED Final   Candida albicans NOT DETECTED NOT DETECTED Final   Candida glabrata NOT DETECTED NOT DETECTED Final   Candida krusei NOT DETECTED NOT DETECTED Final   Candida parapsilosis NOT DETECTED NOT DETECTED Final   Candida tropicalis NOT DETECTED NOT DETECTED Final    Comment: Performed at [facility], [address], [place], [place] [address]  Culture, blood (Routine x 2)     Status: None (Preliminary result)   Collection Time: 07/27/17  7:18 PM  Result Value Ref Range Status   Specimen Description BLOOD RIGHT HAND  Final   Special Requests   Final    BOTTLES DRAWN AEROBIC AND ANAEROBIC Blood Culture adequate volume   Culture  Setup Time   Final    GRAM POSITIVE COCCI IN CHAINS IN BOTH AEROBIC AND ANAEROBIC BOTTLES Performed at [facility], [address], [place], [place] [address]    Culture GRAM POSITIVE COCCI  Final   Report Status PENDING  Incomplete  MRSA PCR Screening     Status: Abnormal   Collection Time: 07/28/17  1:28 AM  Result Value Ref Range Status   MRSA by PCR POSITIVE (A) NEGATIVE Final    Comment:        The GeneXpert MRSA Assay (FDA approved for NASAL specimens only), is one component of a comprehensive MRSA colonization surveillance program. It is not intended to diagnose MRSA infection nor to guide or monitor treatment for MRSA infections. RESULT CALLED TO, READ BACK BY AND VERIFIED WITH: Anson [place]  RN 07/28/17 0358 JDW Performed at [facility], [address] [place], [place] [address]       Radiology Studies: Dg Chest 2 View  Result  Date: 07/27/2017 CLINICAL DATA:  Acute onset of generalized weakness and fever. EXAM: CHEST - 2 VIEW COMPARISON:  Chest radiograph performed 06/07/2017 FINDINGS: The lungs are well-aerated. Vascular congestion is noted. Increased interstitial markings raise concern for pulmonary edema. Small bilateral pleural effusions are suspected. Chronic parenchymal changes are again noted at the right lung base. No pneumothorax is seen. The heart is borderline enlarged. No acute osseous abnormalities are seen. IMPRESSION: Vascular congestion and borderline cardiomegaly. Increased interstitial markings raise concern for pulmonary edema. Small bilateral pleural effusions suspected. Electronically Signed   By: Roanna Raider M.D.   On: 07/27/2017 21:11   Ct Head Wo Contrast  Result Date: 07/27/2017 CLINICAL DATA:  Weakness after leaving dialysis today. History of stroke, hypertension and diabetes. EXAM: CT HEAD WITHOUT CONTRAST TECHNIQUE: Contiguous axial images were obtained from the base of the skull through the vertex without intravenous contrast. COMPARISON:  CT HEAD Jul 30, 2015 FINDINGS: BRAIN: No intraparenchymal hemorrhage, mass effect nor midline shift. The ventricles and sulci are normal for age. Patchy supratentorial white matter hypodensities less than expected for patient's age, though non-specific are most compatible with chronic small vessel ischemic disease. No acute large vascular territory infarcts. No abnormal extra-axial fluid collections. Basal cisterns are patent. VASCULAR: Moderate to severe calcific atherosclerosis of the carotid siphons. SKULL: No skull fracture. No significant scalp soft tissue swelling. Partially empty sella. SINUSES/ORBITS: The mastoid air-cells and included paranasal sinuses are well-aerated.The included ocular globes and orbital  contents are non-suspicious. Status post bilateral ocular lens implants. OTHER: None. IMPRESSION: Negative noncontrast CT HEAD for age. Electronically Signed   By: Awilda Metro M.D.   On: 07/27/2017 22:53    Scheduled Meds: . calcium acetate  1,334 mg Oral TID WC  . Chlorhexidine Gluconate Cloth  6 each Topical Q0600  . cloNIDine  0.3 mg Transdermal Q7 days  . feeding supplement (NEPRO CARB STEADY)  237 mL Oral BID BM  . fluticasone furoate-vilanterol  1 puff Inhalation Daily  . heparin  5,000 Units Subcutaneous Q8H  . insulin aspart  0-9 Units Subcutaneous TID WC  . insulin glargine  5 Units Subcutaneous QHS  . isosorbide mononitrate  30 mg Oral QPM  . lisinopril  40 mg Oral QPM  . metoprolol tartrate  12.5 mg Oral BID  . mupirocin ointment  1 application Nasal BID  . NIFEdipine  60 mg Oral BID  . nitroGLYCERIN  0.4 mg Sublingual See admin instructions  . pravastatin  40 mg Oral Daily  . tiotropium  1 capsule Inhalation Daily   Continuous Infusions: . piperacillin-tazobactam (ZOSYN)  IV 3.375 g (07/28/17 1016)  . [START ON 07/29/2017] vancomycin      Time spent: >25 minutes  Kendell Bane, MD [facility],  Pager [phone]  If 7PM-7AM, please contact night-coverage [email or web address]   Password [email or web address]  07/28/2017, 1:20 PM

## 2017-07-28 NOTE — Evaluation (Signed)
Physical Therapy Evaluation Patient Details Name: Nicole Cox MRN: [id number] DOB: 04/27/1931 Today's Date: 07/28/2017   History of Present Illness  Nicole Cox is a 84 y.o. female with a history of SIRS, ESRD, HTN, COPD, CAD, HTN, hx CVA who presented to the ED for sudden onset of dyspnea the morning of admission. She reported waking up this morning with severe constant shortness of breath and orthopnea. She initially had wheezing, improved with breathing treatment at home but symptoms persisted. She required CPAP by EMS and was found to be in respiratory distress improved with supplemental oxygen.   Clinical Impression  Pt pleasant and agreeable to participate with therapy. Pt reports using 2L O2 at all times in home but does not use O2 when leaving the home. Pt reportedly ambulates to city bus stop for HD treatments. Based on this report, pt has experienced significant functional decline as she demonstrates difficulty with bed mobility and inability to safely ambulate secondary to difficulty moving LLE and bouncing/oscillating trunk upon standing. SNF currently recommended due to inaccessible home environment and amount of physical assistance currently needed for safety    Follow Up Recommendations SNF;Supervision/Assistance - 24 hour    Equipment Recommendations       Recommendations for Other Services       Precautions / Restrictions Precautions Precautions: Fall Precaution Comments: Pt demonstrated instability with standing and stand pivot bed<>chair Restrictions Weight Bearing Restrictions: No      Mobility  Bed Mobility Overal bed mobility: Needs Assistance Bed Mobility: Supine to Sit     Supine to sit: HOB elevated;Min assist     General bed mobility comments: Pt performed supine>sit EOB with HOB elevated with heavy reliance on R bed rail. Pt able to bring BLE off bed but required Min A to bring trunk upright as pt has decreased functional UE  strength  Transfers Overall transfer level: Needs assistance Equipment used: Rolling walker (2 wheeled) Transfers: Sit to/from [employer] Sit to Stand: Min guard Stand pivot transfers: Min guard;Mod assist       General transfer comment: Pt able to perform sit<>stand with RW min guard x 2. When attempting to perform standing marches, pt able to lift RLE but unable to lift LLE. Pt had difficulty with motor planning related to stand pivot transfer from bed>recliner chair as  pt required increased time and mod A to shuffle LLE into position to sit. PT had to assist pt to move RW into proper position  Ambulation/Gait Ambulation/Gait assistance: +2 safety/equipment(Ambulation not attempted due to LLE deficits. Chair follow likely needed for ambulation) Ambulation Distance (Feet): 0 Feet(Pivotal steps bed to chair)            Stairs            Wheelchair Mobility    Modified Rankin (Stroke Patients Only)       Balance Overall balance assessment: Needs assistance Sitting-balance support: No upper extremity supported Sitting balance-Leahy Scale: Fair Sitting balance - Comments: Pt able to sit EOB but has difficulty finding midline with trunk and sits with excessive trunk and cervical flexion   Standing balance support: Bilateral upper extremity supported Standing balance-Leahy Scale: Poor Standing balance comment: Pt able to stand with UE support but began bouncing/oscillating at trunk in standing. Pt reports that this has been occurring off and on for some time  Pertinent Vitals/Pain Pain Assessment: No/denies pain    Home Living Family/patient expects to be discharged to:: Private residence Living Arrangements: Children;Spouse/significant other(2 daughters and son) Available Help at Discharge: Available 24 hours/day Type of Home: House Home Access: Stairs to enter Entrance Stairs-Rails: Right;Left;Can reach  both Entrance Stairs-Number of Steps: 3 Home Layout: Multi-level;Able to live on main level with bedroom/bathroom Home Equipment: Walker - 4 wheels;Other (comment)(2 L O2 all the time. Does not use O2 when leaving home)      Prior Function Level of Independence: Independent with assistive device(s);Needs assistance   Gait / Transfers Assistance Needed: Pt reports ambulating to bus stop and taking bus to HD appointments  ADL's / Homemaking Assistance Needed: Pt needs assistance with meal preparation  Comments: 1 fall in past year when tripping on cord     Hand Dominance        Extremity/Trunk Assessment   Upper Extremity Assessment Upper Extremity Assessment: Generalized weakness    Lower Extremity Assessment Lower Extremity Assessment: Generalized weakness;RLE deficits/detail;LLE deficits/detail RLE Deficits / Details: Pt able to perform bilateral ankle pumps, LAQ, marches, hip abd, hip add WFL in sitting but demonstrates instability and motor planning deficits with functional activity such as standing and stand pivot transfers LLE Deficits / Details: Pt able to perform bilateral ankle pumps, LAQ, marches, hip abd, hip add WFL in sitting but demonstrates instability and motor planning deficits with functional activity such as standing and stand pivot transfers    Cervical / Trunk Assessment Cervical / Trunk Assessment: Kyphotic  Communication   Communication: No difficulties  Cognition Arousal/Alertness: Awake/alert Behavior During Therapy: WFL for tasks assessed/performed Overall Cognitive Status: Within Functional Limits for tasks assessed                                 General Comments: Pt answers questions appropriately but slowly      General Comments      Exercises     Assessment/Plan    PT Assessment Patient needs continued PT services  PT Problem List Decreased strength;Decreased activity tolerance;Decreased balance;Decreased  mobility;Decreased coordination;Decreased knowledge of use of DME;Decreased safety awareness       PT Treatment Interventions DME instruction;Gait training;Stair training;Functional mobility training;Therapeutic activities;Therapeutic exercise;Balance training;Neuromuscular re-education;Patient/family education    PT Goals (Current goals can be found in the Care Plan section)  Acute Rehab PT Goals Patient Stated Goal: To return home PT Goal Formulation: With patient Time For Goal Achievement: 08/11/17 Potential to Achieve Goals: Fair    Frequency Min 2X/week   Barriers to discharge Inaccessible home environment;Decreased caregiver support Pt currently requires 24 hour supervision and is not currently appropriate for stairs    Co-evaluation               AM-PAC PT "6 Clicks" Daily Activity  Outcome Measure Difficulty turning over in bed (including adjusting bedclothes, sheets and blankets)?: A Lot Difficulty moving from lying on back to sitting on the side of the bed? : Unable Difficulty sitting down on and standing up from a chair with arms (e.g., wheelchair, bedside commode, etc,.)?: A Little Help needed moving to and from a bed to chair (including a wheelchair)?: A Lot Help needed walking in hospital room?: Total Help needed climbing 3-5 steps with a railing? : Total 6 Click Score: 10    End of Session Equipment Utilized During Treatment: Gait belt;Oxygen(2LO2 via [place]) Activity Tolerance: Patient tolerated treatment well Patient left:  in chair;with call bell/phone within reach(Chair alarm box not in room; RN notified) Nurse Communication: Mobility status;Other (comment)(Chair alarm box) PT Visit Diagnosis: Unsteadiness on feet (R26.81);Other abnormalities of gait and mobility (R26.89);History of falling (Z91.81)    Time: 1352-1440 PT Time Calculation (min) (ACUTE ONLY): 48 min   Charges:   PT Evaluation $PT Eval Moderate Complexity: 1 Mod PT  Treatments $Therapeutic Activity: 8-22 mins   PT G Codes:       Gabe Christinna Sprung, SPT  [employer] 07/28/2017, 4:25 PM

## 2017-07-29 ENCOUNTER — Observation Stay (HOSPITAL_BASED_OUTPATIENT_CLINIC_OR_DEPARTMENT_OTHER): Payer: Medicare HMO

## 2017-07-29 ENCOUNTER — Other Ambulatory Visit (HOSPITAL_COMMUNITY): Payer: Medicare HMO

## 2017-07-29 DIAGNOSIS — E1122 Type 2 diabetes mellitus with diabetic chronic kidney disease: Secondary | ICD-10-CM | POA: Diagnosis not present

## 2017-07-29 DIAGNOSIS — A4181 Sepsis due to Enterococcus: Principal | ICD-10-CM

## 2017-07-29 DIAGNOSIS — N186 End stage renal disease: Secondary | ICD-10-CM | POA: Diagnosis not present

## 2017-07-29 DIAGNOSIS — I10 Essential (primary) hypertension: Secondary | ICD-10-CM | POA: Diagnosis not present

## 2017-07-29 DIAGNOSIS — R509 Fever, unspecified: Secondary | ICD-10-CM | POA: Diagnosis not present

## 2017-07-29 LAB — GLUCOSE, CAPILLARY
GLUCOSE-CAPILLARY: 60 mg/dL — AB (ref 65–99)
GLUCOSE-CAPILLARY: 91 mg/dL (ref 65–99)
Glucose-Capillary: 141 mg/dL — ABNORMAL HIGH (ref 65–99)
Glucose-Capillary: 220 mg/dL — ABNORMAL HIGH (ref 65–99)
Glucose-Capillary: 112 mg/dL — ABNORMAL HIGH (ref 65–99)

## 2017-07-29 LAB — HEPATITIS B SURFACE ANTIGEN: Hepatitis B Surface Ag: NEGATIVE

## 2017-07-29 LAB — RENAL FUNCTION PANEL
ALBUMIN: 2.4 g/dL — AB (ref 3.5–5.0)
Anion gap: 16 — ABNORMAL HIGH (ref 5–15)
BUN: 44 mg/dL — ABNORMAL HIGH (ref 6–20)
CO2: 24 mmol/L (ref 22–32)
Calcium: 8.1 mg/dL — ABNORMAL LOW (ref 8.9–10.3)
Chloride: 100 mmol/L — ABNORMAL LOW (ref 101–111)
Creatinine, Ser: 7.4 mg/dL — ABNORMAL HIGH (ref 0.44–1.00)
GFR, EST AFRICAN AMERICAN: 5 mL/min — AB (ref >60)
GFR, EST NON AFRICAN AMERICAN: 5 mL/min — AB (ref >60)
Glucose, Bld: 145 mg/dL — ABNORMAL HIGH (ref 65–99)
PHOSPHORUS: 5.8 mg/dL — AB (ref 2.5–4.6)
POTASSIUM: 4.5 mmol/L (ref 3.5–5.1)
Sodium: 140 mmol/L (ref 135–145)

## 2017-07-29 LAB — CBC
HEMATOCRIT: 34.3 % — AB (ref 36.0–46.0)
Hemoglobin: 10.5 g/dL — ABNORMAL LOW (ref 12.0–15.0)
MCH: 27.9 pg (ref 26.0–34.0)
MCHC: 30.6 g/dL (ref 30.0–36.0)
MCV: 91 fL (ref 78.0–100.0)
PLATELETS: 231 10*3/uL (ref 150–400)
RBC: 3.77 MIL/uL — AB (ref 3.87–5.11)
RDW: 19.2 % — ABNORMAL HIGH (ref 11.5–15.5)
WBC: 8.6 10*3/uL (ref 4.0–10.5)

## 2017-07-29 MED ORDER — LIDOCAINE-PRILOCAINE 2.5-2.5 % EX CREA: 1.0000 "application " | TOPICAL_CREAM | CUTANEOUS | Status: DC | PRN

## 2017-07-29 MED ORDER — SODIUM CHLORIDE 0.9 % IV SOLN: 100.0000 mL | INTRAVENOUS | Status: DC | PRN

## 2017-07-29 MED ORDER — DOXERCALCIFEROL 4 MCG/2ML IV SOLN
INTRAVENOUS | Status: AC
  Administered 2017-07-29: 2.5 ug
  Filled 2017-07-29: qty 2

## 2017-07-29 MED ORDER — VANCOMYCIN HCL IN DEXTROSE 500-5 MG/100ML-% IV SOLN
INTRAVENOUS | Status: AC
  Administered 2017-07-29: 500 mg
  Filled 2017-07-29: qty 100

## 2017-07-29 MED ORDER — HEPARIN SODIUM (PORCINE) 1000 UNIT/ML DIALYSIS: 1000.0000 [IU] | INTRAMUSCULAR | Status: DC | PRN

## 2017-07-29 MED ORDER — HEPARIN SODIUM (PORCINE) 1000 UNIT/ML DIALYSIS: 20.0000 [IU]/kg | INTRAMUSCULAR | Status: DC | PRN

## 2017-07-29 MED ORDER — CLONIDINE HCL 0.2 MG/24HR TD PTWK
0.2000 mg | MEDICATED_PATCH | TRANSDERMAL | Status: DC
  Administered 2017-08-04: 0.2 mg via TRANSDERMAL
  Filled 2017-07-29 (×2): qty 1

## 2017-07-29 MED ORDER — ALTEPLASE 2 MG IJ SOLR: 2.0000 mg | Freq: Once | INTRAMUSCULAR | Status: DC | PRN

## 2017-07-29 MED ORDER — LIDOCAINE HCL (PF) 1 % IJ SOLN: 5.0000 mL | INTRAMUSCULAR | Status: DC | PRN

## 2017-07-29 MED ORDER — PENTAFLUOROPROP-TETRAFLUOROETH EX AERO: 1.0000 "application " | INHALATION_SPRAY | CUTANEOUS | Status: DC | PRN

## 2017-07-29 NOTE — Care Management Note (Addendum)
Case Management Note  Patient Details  Name: Nicole Cox MRN: [id number] Date of Birth: June 23, 1931  Subjective/Objective: History of ESRD on hemodialysis, hypertension, CAD, diabetes mellitus and previous stroke. Admitted for SIRS (Systemic Inflammatory Response Syndrome).  PCP noted.           Action/Plan: Once patient is medically stable for discharge and bed available plan to discharge to SNF, per CSW "[name]" arrangements.   Expected Discharge Date:   To Be Determined               Expected Discharge Plan:  Skilled Nursing Facility  In-House Referral:  Clinical Social Work  Discharge planning Services  CM Consult  Status of Service:  In process, will continue to follow  Yancey Flemings, RN 07/29/2017, 1:04 PM

## 2017-07-29 NOTE — Progress Notes (Signed)
Subjective:  Seen on HD- no temp overnight- sens to enterococcus not back yet - says is dpressed "they dont know what is wrong with me"  Objective Vital signs in last 24 hours: Vitals:   07/29/17 0821 07/29/17 0835 07/29/17 0840 07/29/17 0900  BP: (!) (P) 126/56 (!) (P) 135/56  (!) (P) 137/46  Pulse: (P) 68 (P) 64 (P) 70 (P) 66  Resp: (!) (P) 22 (!) (P) 21 (!) (P) 23 (!) (P) 22  Temp: (P) 98.4 F (36.9 C)     TempSrc: (P) Oral     SpO2: (P) 100%     Weight:      Height:       Weight change: 3.832 kg (8 lb 7.2 oz)  Intake/Output Summary (Last 24 hours) at 07/29/2017 0917 Last data filed at 07/29/2017 0600 Gross per 24 hour  Intake 840 ml  Output 0 ml  Net 840 ml    Dialyzes at [facility]-Dr. Richard Miu hours EDW 129 pounds. HD Bath 2/2.5, Dialyzer 180, Heparin 2000 bolus, then with hourly rate . Access AVG. 15-gauge blood flow rate 400 on Mircera 130 mcg to q. weeks, last given on 5/9.  Hectorol 2.5 q. treatment   Assessment/Plan: 84 year old black female with ESRD presented with weakness after dialysis-and found to have enterococcal bacteremia 1 Enterococcal bacteremia-has responded fairly quickly to antibiotic therapy, feels to be at her baseline.  ID is involved.  Unknown source of infection.  AV graft does not appear to be the source.  Outside possibility is that she was inoculated with the bacteria at the time of her hemodialysis treatment.  Would be great if can find antibiotic that she could get in HD 2 ESRD: [facility] by Dr. Jackalyn Lombard.  We will plan for her routine dialysis today, next Sat if still here 3 Hypertension: Blood pressure seems fine.-She is on a significant regimen of clonidine patch, lisinopril, Lopressor, nifedipine-volume status seems fine.  Unclear why she needs so many blood pressure medications- will taqke clonidine down to 0.2  4. Anemia of ESRD: Hemoglobin actually pretty good.  She received her Mircera on 5/9.  No plans for dosing further  here 5. Bones-  We will continue with Hectorol 2.5 q. treatment.  She is on PhosLo and that has been continued     Osmara Drummonds A    Labs: Basic Metabolic Panel: Recent Labs  Lab 07/27/17 1911 07/28/17 0203  NA 139 139  K 4.1 4.1  CL 101 100*  CO2 25 25  GLUCOSE 146* 197*  BUN 18 23*  CREATININE 4.88* 5.38*  CALCIUM 8.3* 8.0*   Liver Function Tests: Recent Labs  Lab 07/27/17 1911 07/28/17 0203  AST 47* 54*  ALT 39 43  ALKPHOS 124 113  BILITOT 0.9 0.7  PROT 7.5 6.9  ALBUMIN 2.9* 2.7*   No results for input(s): LIPASE, AMYLASE in the last 168 hours. No results for input(s): AMMONIA in the last 168 hours. CBC: Recent Labs  Lab 07/27/17 1911 07/28/17 0203 07/29/17 0858  WBC 12.0* 9.5 8.6  NEUTROABS 10.2* 7.4  --   HGB 11.8* 10.8* 10.5*  HCT 38.3 35.9* 34.3*  MCV 91.4 92.3 91.0  PLT 277 247 231   Cardiac Enzymes: Recent Labs  Lab 07/28/17 0203  CKTOTAL 129  TROPONINI 0.07*   CBG: Recent Labs  Lab 07/28/17 0805 07/28/17 1128 07/28/17 1647 07/28/17 2208 07/29/17 0728  GLUCAP 93 165* 143* 161* 141*    Iron Studies: No results for  input(s): IRON, TIBC, TRANSFERRIN, FERRITIN in the last 72 hours. Studies/Results: Dg Chest 2 View  Result Date: 07/27/2017 CLINICAL DATA:  Acute onset of generalized weakness and fever. EXAM: CHEST - 2 VIEW COMPARISON:  Chest radiograph performed 06/07/2017 FINDINGS: The lungs are well-aerated. Vascular congestion is noted. Increased interstitial markings raise concern for pulmonary edema. Small bilateral pleural effusions are suspected. Chronic parenchymal changes are again noted at the right lung base. No pneumothorax is seen. The heart is borderline enlarged. No acute osseous abnormalities are seen. IMPRESSION: Vascular congestion and borderline cardiomegaly. Increased interstitial markings raise concern for pulmonary edema. Small bilateral pleural effusions suspected. Electronically Signed   By: Roanna Raider  M.D.   On: 07/27/2017 21:11   Ct Head Wo Contrast  Result Date: 07/27/2017 CLINICAL DATA:  Weakness after leaving dialysis today. History of stroke, hypertension and diabetes. EXAM: CT HEAD WITHOUT CONTRAST TECHNIQUE: Contiguous axial images were obtained from the base of the skull through the vertex without intravenous contrast. COMPARISON:  CT HEAD Jul 30, 2015 FINDINGS: BRAIN: No intraparenchymal hemorrhage, mass effect nor midline shift. The ventricles and sulci are normal for age. Patchy supratentorial white matter hypodensities less than expected for patient's age, though non-specific are most compatible with chronic small vessel ischemic disease. No acute large vascular territory infarcts. No abnormal extra-axial fluid collections. Basal cisterns are patent. VASCULAR: Moderate to severe calcific atherosclerosis of the carotid siphons. SKULL: No skull fracture. No significant scalp soft tissue swelling. Partially empty sella. SINUSES/ORBITS: The mastoid air-cells and included paranasal sinuses are well-aerated.The included ocular globes and orbital contents are non-suspicious. Status post bilateral ocular lens implants. OTHER: None. IMPRESSION: Negative noncontrast CT HEAD for age. Electronically Signed   By: Awilda Metro M.D.   On: 07/27/2017 22:53   Medications: Infusions: . sodium chloride    . sodium chloride    . vancomycin      Scheduled Medications: . calcium acetate  1,334 mg Oral TID WC  . Chlorhexidine Gluconate Cloth  6 each Topical Q0600  . cloNIDine  0.3 mg Transdermal Q7 days  . doxercalciferol  2.5 mcg Oral Q T,Th,Sa-HD  . feeding supplement (NEPRO CARB STEADY)  237 mL Oral BID BM  . fluticasone furoate-vilanterol  1 puff Inhalation Daily  . heparin  5,000 Units Subcutaneous Q8H  . insulin aspart  0-9 Units Subcutaneous TID WC  . insulin glargine  5 Units Subcutaneous QHS  . isosorbide mononitrate  30 mg Oral QPM  . lisinopril  40 mg Oral QPM  . metoprolol tartrate   12.5 mg Oral BID  . mupirocin ointment  1 application Nasal BID  . NIFEdipine  60 mg Oral BID  . nitroGLYCERIN  0.4 mg Sublingual See admin instructions  . pravastatin  40 mg Oral Daily  . tiotropium  1 capsule Inhalation Daily    have reviewed scheduled and prn medications.  Physical Exam: General: NAD Heart: RRR- HR 60-s Lungs: mostly clear Abdomen: soft,  Nontender Extremities: no edema Dialysis Access: left AVG- accessed    07/29/2017,9:17 AM  LOS: 0 days

## 2017-07-29 NOTE — Procedures (Signed)
Patient was seen on dialysis and the procedure was supervised.  BFR 400  Via AVG BP is  137/46.   Patient appears to be tolerating treatment well  Analilia Geddis A 07/29/2017

## 2017-07-29 NOTE — Progress Notes (Signed)
Pt sitting up eating with assistance.  D50 25 ml given.

## 2017-07-29 NOTE — Progress Notes (Signed)
[facility]  [facility] Coordinator will follow Ms. Endres with ID Team for possible long term IV ABX if needed at DC to home.  If patient discharges after hours, please call [phone].   Sedalia Muta 07/29/2017, 8:35 AM

## 2017-07-29 NOTE — Progress Notes (Signed)
[facility]    Date of Admission:  07/27/2017   Total days of antibiotics 3        Day 3 vancomycin           ID: Nicole Cox is a 84 y.o. female with ESRD with enterococcal bacteremia, of unclear source Principal Problem:   SIRS (systemic inflammatory response syndrome) (HCC) Active Problems:   Hypertension   DM (diabetes mellitus), type 2 with renal complications (HCC)   CAD (coronary artery disease)   ESRD on hemodialysis (HCC)   Chronic anemia    Subjective: Afebrile.  Medications:  . calcium acetate  1,334 mg Oral TID WC  . Chlorhexidine Gluconate Cloth  6 each Topical Q0600  . [START ON 08/04/2017] cloNIDine  0.2 mg Transdermal Q7 days  . doxercalciferol  2.5 mcg Oral Q T,Th,Sa-HD  . feeding supplement (NEPRO CARB STEADY)  237 mL Oral BID BM  . fluticasone furoate-vilanterol  1 puff Inhalation Daily  . heparin  5,000 Units Subcutaneous Q8H  . insulin aspart  0-9 Units Subcutaneous TID WC  . insulin glargine  5 Units Subcutaneous QHS  . isosorbide mononitrate  30 mg Oral QPM  . lisinopril  40 mg Oral QPM  . metoprolol tartrate  12.5 mg Oral BID  . mupirocin ointment  1 application Nasal BID  . NIFEdipine  60 mg Oral BID  . nitroGLYCERIN  0.4 mg Sublingual See admin instructions  . pravastatin  40 mg Oral Daily  . tiotropium  1 capsule Inhalation Daily    Objective: Vital signs in last 24 hours: Temp:  [98.2 F (36.8 C)-98.8 F (37.1 C)] 98.4 F (36.9 C) (05/16 1230) Pulse Rate:  [62-83] 78 (05/16 1317) Resp:  [16-23] 18 (05/16 1317) BP: (120-177)/(41-64) 177/52 (05/16 1317) SpO2:  [99 %-100 %] 100 % (05/16 1317) Weight:  [132 lb 4.4 oz (60 kg)-138 lb 7.2 oz (62.8 kg)] 136 lb 11 oz (62 kg) (05/16 1230) Physical Exam  Constitutional:  oriented to person, place. appears stated age and well-nourished. No distress.  HENT: [place]/AT, PERRLA, no scleral icterus Mouth/Throat: Oropharynx is clear and moist. No oropharyngeal exudate.    Cardiovascular: Normal rate, regular rhythm and normal heart sounds. Exam reveals no gallop and no friction rub.  No murmur heard.  Pulmonary/Chest: Effort normal and breath sounds normal. No respiratory distress.  has no wheezes.  Neck = supple, no nuchal rigidity Abdominal: Soft. Bowel sounds are normal.  exhibits no distension. There is no tenderness.  Lymphadenopathy: no cervical adenopathy. No axillary adenopathy Neurological: alert and oriented to person, place, and time.  Skin: Skin is warm and dry. No rash noted. No erythema.  Psychiatric: a normal mood and affect.  behavior is normal.    Lab Results Recent Labs    07/28/17 0203 07/29/17 0858  WBC 9.5 8.6  HGB 10.8* 10.5*  HCT 35.9* 34.3*  NA 139 140  K 4.1 4.5  CL 100* 100*  CO2 25 24  BUN 23* 44*  CREATININE 5.38* 7.40*   Liver Panel Recent Labs    07/27/17 1911 07/28/17 0203 07/29/17 0858  PROT 7.5 6.9  --   ALBUMIN 2.9* 2.7* 2.4*  AST 47* 54*  --   ALT 39 43  --   ALKPHOS 124 113  --   BILITOT 0.9 0.7  --     Microbiology: 5/14 blood cx enterococcus amp S 5/15 blood cx pending  MRSA pending Studies/Results: Dg Chest 2 View  Result  Date: 07/27/2017 CLINICAL DATA:  Acute onset of generalized weakness and fever. EXAM: CHEST - 2 VIEW COMPARISON:  Chest radiograph performed 06/07/2017 FINDINGS: The lungs are well-aerated. Vascular congestion is noted. Increased interstitial markings raise concern for pulmonary edema. Small bilateral pleural effusions are suspected. Chronic parenchymal changes are again noted at the right lung base. No pneumothorax is seen. The heart is borderline enlarged. No acute osseous abnormalities are seen. IMPRESSION: Vascular congestion and borderline cardiomegaly. Increased interstitial markings raise concern for pulmonary edema. Small bilateral pleural effusions suspected. Electronically Signed   By: Roanna Raider M.D.   On: 07/27/2017 21:11   Ct Head Wo Contrast  Result Date:  07/27/2017 CLINICAL DATA:  Weakness after leaving dialysis today. History of stroke, hypertension and diabetes. EXAM: CT HEAD WITHOUT CONTRAST TECHNIQUE: Contiguous axial images were obtained from the base of the skull through the vertex without intravenous contrast. COMPARISON:  CT HEAD Jul 30, 2015 FINDINGS: BRAIN: No intraparenchymal hemorrhage, mass effect nor midline shift. The ventricles and sulci are normal for age. Patchy supratentorial white matter hypodensities less than expected for patient's age, though non-specific are most compatible with chronic small vessel ischemic disease. No acute large vascular territory infarcts. No abnormal extra-axial fluid collections. Basal cisterns are patent. VASCULAR: Moderate to severe calcific atherosclerosis of the carotid siphons. SKULL: No skull fracture. No significant scalp soft tissue swelling. Partially empty sella. SINUSES/ORBITS: The mastoid air-cells and included paranasal sinuses are well-aerated.The included ocular globes and orbital contents are non-suspicious. Status post bilateral ocular lens implants. OTHER: None. IMPRESSION: Negative noncontrast CT HEAD for age. Electronically Signed   By: Awilda Metro M.D.   On: 07/27/2017 22:53     Assessment/Plan: Enterococcal bacteremia = continue with vancomycin with HD. Awaiting sensitivities. Repeat cx is pending  Source is unknown. Would recommend we get TEE to decide course of therapy  [facility] Infectious Diseases Cell: [phone] Pager: [phone]  07/29/2017, 2:51 PM

## 2017-07-29 NOTE — Progress Notes (Signed)
Preliminary results by tech - Left Upper extremity evaluation for fluid/abscess around fistula is completed. Patient just returned from dialysis. Limited evaluation of the upper extremity due to tape in place. There appears to be no obvious fluid or abscess around the AVF. Nicole Cox, BS, RDMS, RVT

## 2017-07-29 NOTE — Progress Notes (Signed)
PROGRESS NOTE    Patient: Nicole Cox     PCP: Joana Reamer, MD                    DOB: November 16, 1931            DOA: 07/27/2017 [id number]             DOS: 07/29/2017, 11:59 AM   LOS: 0 days   Date of Service: The patient was seen and examined on 07/29/2017  Subjective:   Patient was seen and examined this morning, afebrile, normotensive.   Patient was seen comfortably in bed receiving hemodialysis.  No issues overnight. Per nursing staff no issues overnight.  Patient was informed that 1 of her 2 blood cultures are growing Therefore infectious disease team was consulted. Therefore she needs to stay for further evaluation and continue IV antibiotics till source of infection and current infection is treated accurately.. She nodded in understanding.  ----------------------------------------------------------------------------------------------------------------------  Brief Narrative:  Nicole Cox is a 84 y.o. female with history of ESRD on hemodialysis, hypertension, CAD, diabetes mellitus and previous stroke was brought to the ER after patient was found to be weak.  As per the daughter patient has been getting weak last 2 days.  Following dialysis patient had progressive weakness was brought to the ED for further evaluation.  She was found to have a temperature of 102.0,  But the initial criteria for SIRS, borderline hypotensive.  Subsequently admitted, cultures were obtained, initiated on spectrum antibiotics  Principal Problem:   SIRS (systemic inflammatory response syndrome) (HCC) Active Problems:   Hypertension   DM (diabetes mellitus), type 2 with renal complications (HCC)   CAD (coronary artery disease)   ESRD on hemodialysis (HCC)   Chronic anemia   Assessment & Plan:   SIRS/possible bacteremia - -1/2  blood cultures growing enterococcus -Currently afebrile, normotensive -Admission patient was on vancomycin and Zosyn,  -DC Zosyn, continue  vancomycin -Repeated blood cultures obtained, infectious disease team following  ESRD - on hemodialysis on Tuesday Thursday and Saturday.  [place] -On hemodialysis today.   -Closely follow respiratory status and metabolic panel. -Nephrology for hemodialysis possibly in a.m.  Diabetes mellitus type  II -on Lantus insulin 5 units at bedtime with sliding scale coverage at this time.  Patient has had previous episodes of hypoglycemia so closely follow CBGs.  Hypertension - on clonidine patch, nifedipine lisinopril, Imdur and metoprolol.  History of chronic anemia/chronic disease, CKD -Monitoring H&H closely   History of CAD  -denies any chest pain.   -on statins beta-blockers Imdur.  Previous history of stroke  -presently appears nonfocal CT head unremarkable  Consultants:   Nephro Kathrene Bongo   ID   Procedures:  Dialysis    Antimicrobials:  Anti-infectives (From admission, onward)   Start     Dose/Rate Route Frequency Ordered Stop   07/29/17 1200  vancomycin (VANCOCIN) 500 mg in sodium chloride 0.9 % 100 mL IVPB     500 mg 100 mL/hr over 60 Minutes Intravenous Every T-Th-Sa (Hemodialysis) 07/28/17 0118     07/29/17 1130  vancomycin (VANCOCIN) 500-5 MG/100ML-% IVPB    Note to Pharmacy:  Cay Schillings   : cabinet override      07/29/17 1130 07/29/17 1130   07/28/17 0130  piperacillin-tazobactam (ZOSYN) IVPB 3.375 g  Status:  Discontinued     3.375 g 12.5 mL/hr over 240 Minutes Intravenous Every 12 hours 07/28/17 0118 07/28/17 1325   07/28/17 0115  piperacillin-tazobactam (ZOSYN) IVPB 3.375  g  Status:  Discontinued     3.375 g 100 mL/hr over 30 Minutes Intravenous  Once 07/28/17 0108 07/28/17 0117   07/27/17 2145  ceFEPIme (MAXIPIME) 2 g in sodium chloride 0.9 % 100 mL IVPB     2 g 200 mL/hr over 30 Minutes Intravenous  Once 07/27/17 2134 07/27/17 2248   07/27/17 2145  vancomycin (VANCOCIN) IVPB 1000 mg/200 mL premix     1,000 mg 200 mL/hr over 60 Minutes  Intravenous  Once 07/27/17 2134 07/28/17 0135        Objective:     Intake/Output Summary (Last 24 hours) at 07/29/2017 1159 Last data filed at 07/29/2017 0600 Gross per 24 hour  Intake 660 ml  Output 0 ml  Net 660 ml   Filed Weights   07/28/17 0007 07/28/17 0143 07/29/17 0540  Weight: 60.9 kg (134 lb 4.2 oz) 60.9 kg (134 lb 4.2 oz) 62.8 kg (138 lb 7.2 oz)      BP (!) 129/53   Pulse 72   Temp 98.4 F (36.9 C) (Oral)   Resp 20   Ht  (1.6 m)   Wt 62.8 kg (138 lb 7.2 oz)   SpO2 100%   BMI 24.53 kg/m    Physical Exam  Constitution:  Alert, cooperative, no distress,  Psychiatric: Normal and stable mood and affect, cognition intact,   HEENT: Normocephalic, PERRL, otherwise with in Normal limits  Cardio vascular:  S1/S2, RRR, No murmure, No Rubs or Gallops  Chest/pulmonary: Clear to auscultation bilaterally, respirations unlabored  Chest symmetric Abdomen: Soft, non-tender, non-distended, bowel sounds,no masses, no organomegaly Muscular skeletal: Limited exam - in bed, able to move all 4 extremities, Normal strength,  Neuro: CNII-XII intact. , normal motor and sensation, reflexes intact  Extremities: No pitting edema lower extremities, +2 pulses  Skin: Dry, warm to touch, negative for any Rashes, No open wounds     Data Reviewed: I have personally reviewed following labs and imaging studies  CBC: Recent Labs  Lab 07/27/17 1911 07/28/17 0203 07/29/17 0858  WBC 12.0* 9.5 8.6  NEUTROABS 10.2* 7.4  --   HGB 11.8* 10.8* 10.5*  HCT 38.3 35.9* 34.3*  MCV 91.4 92.3 91.0  PLT 277 247 231   Basic Metabolic Panel: Recent Labs  Lab 07/27/17 1911 07/28/17 0203 07/29/17 0858  NA 139 139 140  K 4.1 4.1 4.5  CL 101 100* 100*  CO2 GLUCOSE 146* 197* 145*  BUN 18 23* 44*  CREATININE 4.88* 5.38* 7.40*  CALCIUM 8.3* 8.0* 8.1*  PHOS  --   --  5.8*   GFR: Estimated Creatinine Clearance: 4.8 mL/min (A) (by C-G formula based on SCr of 7.4 mg/dL  (H)). Liver Function Tests: Recent Labs  Lab 07/27/17 1911 07/28/17 0203 07/29/17 0858  AST 47* 54*  --   ALT 39 43  --   ALKPHOS 124 113  --   BILITOT 0.9 0.7  --   PROT 7.5 6.9  --   ALBUMIN 2.9* 2.7* 2.4*   No results for input(s): LIPASE, AMYLASE in the last 168 hours. No results for input(s): AMMONIA in the last 168 hours. Coagulation Profile: Recent Labs  Lab 07/27/17 1911  INR 1.09   Cardiac Enzymes: Recent Labs  Lab 07/28/17 0203  CKTOTAL 129  TROPONINI 0.07*   BNP (last 3 results) No results for input(s): PROBNP in the last 8760 hours. HbA1C: No results for input(s): HGBA1C in the last 72 hours. CBG: Recent Labs  Lab 07/28/17 0805 07/28/17 1128 07/28/17 1647 07/28/17 2208 07/29/17 0728  GLUCAP 93 165* 143* 161* 141*   Lipid Profile: No results for input(s): CHOL, HDL, LDLCALC, TRIG, CHOLHDL, LDLDIRECT in the last 72 hours. Thyroid Function Tests: Recent Labs    07/28/17 0203  TSH 1.590   Anemia Panel: No results for input(s): VITAMINB12, FOLATE, FERRITIN, TIBC, IRON, RETICCTPCT in the last 72 hours. Sepsis Labs: Recent Labs  Lab 07/27/17 1924 07/28/17 0203  PROCALCITON  --  1.89  LATICACIDVEN 1.72 1.8    Recent Results (from the past 240 hour(s))  Culture, blood (Routine x 2)     Status: None (Preliminary result)   Collection Time: 07/27/17  7:13 PM  Result Value Ref Range Status   Specimen Description BLOOD RIGHT FOREARM  Final   Special Requests   Final    BOTTLES DRAWN AEROBIC AND ANAEROBIC Blood Culture adequate volume   Culture  Setup Time   Final    GRAM POSITIVE COCCI IN BOTH AEROBIC AND ANAEROBIC BOTTLES CRITICAL RESULT CALLED TO, READ BACK BY AND VERIFIED WITH: PHARMD E SINCLARE 07/28/17 1305 PM BY MORAC    Culture   Final    GRAM POSITIVE COCCI CULTURE REINCUBATED FOR BETTER GROWTH Performed at [facility], [address], [place], [place] [address]    Report Status PENDING  Incomplete  Blood Culture ID Panel  (Reflexed)     Status: Abnormal   Collection Time: 07/27/17  7:13 PM  Result Value Ref Range Status   Enterococcus species DETECTED (A) NOT DETECTED Final    Comment: CRITICAL RESULT CALLED TO, READ BACK BY AND VERIFIED WITH: PHARMD E SINCLARE 07/28/17 AT 1305 BY CM    Vancomycin resistance NOT DETECTED NOT DETECTED Final   Listeria monocytogenes NOT DETECTED NOT DETECTED Final   Staphylococcus species NOT DETECTED NOT DETECTED Final   Staphylococcus aureus NOT DETECTED NOT DETECTED Final   Streptococcus species NOT DETECTED NOT DETECTED Final   Streptococcus agalactiae NOT DETECTED NOT DETECTED Final   Streptococcus pneumoniae NOT DETECTED NOT DETECTED Final   Streptococcus pyogenes NOT DETECTED NOT DETECTED Final   Acinetobacter baumannii NOT DETECTED NOT DETECTED Final   Enterobacteriaceae species NOT DETECTED NOT DETECTED Final   Enterobacter cloacae complex NOT DETECTED NOT DETECTED Final   Escherichia coli NOT DETECTED NOT DETECTED Final   Klebsiella oxytoca NOT DETECTED NOT DETECTED Final   Klebsiella pneumoniae NOT DETECTED NOT DETECTED Final   Proteus species NOT DETECTED NOT DETECTED Final   Serratia marcescens NOT DETECTED NOT DETECTED Final   Haemophilus influenzae NOT DETECTED NOT DETECTED Final   Neisseria meningitidis NOT DETECTED NOT DETECTED Final   Pseudomonas aeruginosa NOT DETECTED NOT DETECTED Final   Candida albicans NOT DETECTED NOT DETECTED Final   Candida glabrata NOT DETECTED NOT DETECTED Final   Candida krusei NOT DETECTED NOT DETECTED Final   Candida parapsilosis NOT DETECTED NOT DETECTED Final   Candida tropicalis NOT DETECTED NOT DETECTED Final    Comment: Performed at [facility], [address], [place], [place] [address]  Culture, blood (Routine x 2)     Status: None (Preliminary result)   Collection Time: 07/27/17  7:18 PM  Result Value Ref Range Status   Specimen Description BLOOD RIGHT HAND  Final   Special Requests   Final    BOTTLES  DRAWN AEROBIC AND ANAEROBIC Blood Culture adequate volume   Culture  Setup Time   Final    GRAM POSITIVE COCCI IN CHAINS  IN BOTH AEROBIC AND ANAEROBIC BOTTLES CRITICAL VALUE NOTED.  VALUE IS CONSISTENT WITH PREVIOUSLY REPORTED AND CALLED VALUE.    Culture   Final    GRAM POSITIVE COCCI IDENTIFICATION AND SUSCEPTIBILITIES TO FOLLOW Performed at [facility], [address], [place], [place] [address]    Report Status PENDING  Incomplete  MRSA PCR Screening     Status: Abnormal   Collection Time: 07/28/17  1:28 AM  Result Value Ref Range Status   MRSA by PCR POSITIVE (A) NEGATIVE Final    Comment:        The GeneXpert MRSA Assay (FDA approved for NASAL specimens only), is one component of a comprehensive MRSA colonization surveillance program. It is not intended to diagnose MRSA infection nor to guide or monitor treatment for MRSA infections. RESULT CALLED TO, READ BACK BY AND VERIFIED WITH: Anson Oregon RN 07/28/17 0358 JDW Performed at [facility], [address], [place], [place] [address]   Culture, blood (Routine X 2) w Reflex to ID Panel     Status: None (Preliminary result)   Collection Time: 07/28/17  3:37 PM  Result Value Ref Range Status   Specimen Description BLOOD RIGHT ANTECUBITAL  Final   Special Requests   Final    BOTTLES DRAWN AEROBIC ONLY Blood Culture adequate volume   Culture   Final    NO GROWTH < 24 HOURS Performed at [facility], [address] [place], [place] [address]    Report Status PENDING  Incomplete      Radiology Studies: Dg Chest 2 View  Result Date: 07/27/2017 CLINICAL DATA:  Acute onset of generalized weakness and fever. EXAM: CHEST - 2 VIEW COMPARISON:  Chest radiograph performed 06/07/2017 FINDINGS: The lungs are well-aerated. Vascular congestion is noted. Increased interstitial markings raise concern for pulmonary edema. Small bilateral pleural effusions are suspected. Chronic parenchymal changes are again noted at the  right lung base. No pneumothorax is seen. The heart is borderline enlarged. No acute osseous abnormalities are seen. IMPRESSION: Vascular congestion and borderline cardiomegaly. Increased interstitial markings raise concern for pulmonary edema. Small bilateral pleural effusions suspected. Electronically Signed   By: Roanna Raider M.D.   On: 07/27/2017 21:11   Ct Head Wo Contrast  Result Date: 07/27/2017 CLINICAL DATA:  Weakness after leaving dialysis today. History of stroke, hypertension and diabetes. EXAM: CT HEAD WITHOUT CONTRAST TECHNIQUE: Contiguous axial images were obtained from the base of the skull through the vertex without intravenous contrast. COMPARISON:  CT HEAD Jul 30, 2015 FINDINGS: BRAIN: No intraparenchymal hemorrhage, mass effect nor midline shift. The ventricles and sulci are normal for age. Patchy supratentorial white matter hypodensities less than expected for patient's age, though non-specific are most compatible with chronic small vessel ischemic disease. No acute large vascular territory infarcts. No abnormal extra-axial fluid collections. Basal cisterns are patent. VASCULAR: Moderate to severe calcific atherosclerosis of the carotid siphons. SKULL: No skull fracture. No significant scalp soft tissue swelling. Partially empty sella. SINUSES/ORBITS: The mastoid air-cells and included paranasal sinuses are well-aerated.The included ocular globes and orbital contents are non-suspicious. Status post bilateral ocular lens implants. OTHER: None. IMPRESSION: Negative noncontrast CT HEAD for age. Electronically Signed   By: Awilda Metro M.D.   On: 07/27/2017 22:53    Scheduled Meds: . calcium acetate  1,334 mg Oral TID WC  . Chlorhexidine Gluconate Cloth  6 each Topical Q0600  . [START ON 08/04/2017] cloNIDine  0.2 mg Transdermal Q7 days  . doxercalciferol  2.5 mcg Oral Q T,Th,Sa-HD  . feeding supplement (NEPRO CARB STEADY)  237 mL Oral BID BM  . fluticasone furoate-vilanterol  1  puff Inhalation Daily  . heparin  5,000 Units Subcutaneous Q8H  . insulin aspart  0-9 Units Subcutaneous TID WC  . insulin glargine  5 Units Subcutaneous QHS  . isosorbide mononitrate  30 mg Oral QPM  . lisinopril  40 mg Oral QPM  . metoprolol tartrate  12.5 mg Oral BID  . mupirocin ointment  1 application Nasal BID  . NIFEdipine  60 mg Oral BID  . nitroGLYCERIN  0.4 mg Sublingual See admin instructions  . pravastatin  40 mg Oral Daily  . tiotropium  1 capsule Inhalation Daily   Continuous Infusions: . sodium chloride    . sodium chloride    . vancomycin      Time spent: >25 minutes  Kendell Bane, MD [facility],  Pager [phone]  If 7PM-7AM, please contact night-coverage [email or web address]   Password [email or web address]  07/29/2017, 11:59 AM

## 2017-07-30 ENCOUNTER — Observation Stay (HOSPITAL_COMMUNITY): Payer: Medicare HMO

## 2017-07-30 ENCOUNTER — Encounter (HOSPITAL_COMMUNITY): Payer: Self-pay | Admitting: Cardiology

## 2017-07-30 DIAGNOSIS — Z794 Long term (current) use of insulin: Secondary | ICD-10-CM | POA: Diagnosis not present

## 2017-07-30 DIAGNOSIS — Z9049 Acquired absence of other specified parts of digestive tract: Secondary | ICD-10-CM | POA: Diagnosis not present

## 2017-07-30 DIAGNOSIS — I10 Essential (primary) hypertension: Secondary | ICD-10-CM | POA: Diagnosis not present

## 2017-07-30 DIAGNOSIS — Z79899 Other long term (current) drug therapy: Secondary | ICD-10-CM | POA: Diagnosis not present

## 2017-07-30 DIAGNOSIS — I071 Rheumatic tricuspid insufficiency: Secondary | ICD-10-CM | POA: Diagnosis present

## 2017-07-30 DIAGNOSIS — J449 Chronic obstructive pulmonary disease, unspecified: Secondary | ICD-10-CM | POA: Diagnosis present

## 2017-07-30 DIAGNOSIS — R7881 Bacteremia: Secondary | ICD-10-CM | POA: Diagnosis not present

## 2017-07-30 DIAGNOSIS — I959 Hypotension, unspecified: Secondary | ICD-10-CM | POA: Diagnosis present

## 2017-07-30 DIAGNOSIS — Z8673 Personal history of transient ischemic attack (TIA), and cerebral infarction without residual deficits: Secondary | ICD-10-CM | POA: Diagnosis not present

## 2017-07-30 DIAGNOSIS — I2510 Atherosclerotic heart disease of native coronary artery without angina pectoris: Secondary | ICD-10-CM | POA: Diagnosis present

## 2017-07-30 DIAGNOSIS — E11649 Type 2 diabetes mellitus with hypoglycemia without coma: Secondary | ICD-10-CM | POA: Diagnosis present

## 2017-07-30 DIAGNOSIS — Z833 Family history of diabetes mellitus: Secondary | ICD-10-CM | POA: Diagnosis not present

## 2017-07-30 DIAGNOSIS — Z823 Family history of stroke: Secondary | ICD-10-CM | POA: Diagnosis not present

## 2017-07-30 DIAGNOSIS — N2581 Secondary hyperparathyroidism of renal origin: Secondary | ICD-10-CM | POA: Diagnosis present

## 2017-07-30 DIAGNOSIS — Z8249 Family history of ischemic heart disease and other diseases of the circulatory system: Secondary | ICD-10-CM | POA: Diagnosis not present

## 2017-07-30 DIAGNOSIS — D631 Anemia in chronic kidney disease: Secondary | ICD-10-CM | POA: Diagnosis present

## 2017-07-30 DIAGNOSIS — E1121 Type 2 diabetes mellitus with diabetic nephropathy: Secondary | ICD-10-CM | POA: Diagnosis not present

## 2017-07-30 DIAGNOSIS — Z992 Dependence on renal dialysis: Secondary | ICD-10-CM | POA: Diagnosis not present

## 2017-07-30 DIAGNOSIS — Z90710 Acquired absence of both cervix and uterus: Secondary | ICD-10-CM | POA: Diagnosis not present

## 2017-07-30 DIAGNOSIS — Z87891 Personal history of nicotine dependence: Secondary | ICD-10-CM | POA: Diagnosis not present

## 2017-07-30 DIAGNOSIS — I132 Hypertensive heart and chronic kidney disease with heart failure and with stage 5 chronic kidney disease, or end stage renal disease: Secondary | ICD-10-CM | POA: Diagnosis present

## 2017-07-30 DIAGNOSIS — E1122 Type 2 diabetes mellitus with diabetic chronic kidney disease: Secondary | ICD-10-CM | POA: Diagnosis present

## 2017-07-30 DIAGNOSIS — I340 Nonrheumatic mitral (valve) insufficiency: Secondary | ICD-10-CM | POA: Diagnosis not present

## 2017-07-30 DIAGNOSIS — I509 Heart failure, unspecified: Secondary | ICD-10-CM | POA: Diagnosis present

## 2017-07-30 DIAGNOSIS — B952 Enterococcus as the cause of diseases classified elsewhere: Secondary | ICD-10-CM | POA: Diagnosis not present

## 2017-07-30 DIAGNOSIS — N186 End stage renal disease: Secondary | ICD-10-CM | POA: Diagnosis present

## 2017-07-30 DIAGNOSIS — Z841 Family history of disorders of kidney and ureter: Secondary | ICD-10-CM | POA: Diagnosis not present

## 2017-07-30 DIAGNOSIS — A4181 Sepsis due to Enterococcus: Secondary | ICD-10-CM | POA: Diagnosis present

## 2017-07-30 DIAGNOSIS — D649 Anemia, unspecified: Secondary | ICD-10-CM | POA: Diagnosis not present

## 2017-07-30 DIAGNOSIS — Z9089 Acquired absence of other organs: Secondary | ICD-10-CM | POA: Diagnosis not present

## 2017-07-30 LAB — CULTURE, BLOOD (ROUTINE X 2): SPECIAL REQUESTS: ADEQUATE

## 2017-07-30 LAB — GLUCOSE, CAPILLARY
GLUCOSE-CAPILLARY: 114 mg/dL — AB (ref 65–99)
GLUCOSE-CAPILLARY: 116 mg/dL — AB (ref 65–99)
GLUCOSE-CAPILLARY: 175 mg/dL — AB (ref 65–99)
Glucose-Capillary: 132 mg/dL — ABNORMAL HIGH (ref 65–99)

## 2017-07-30 MED ORDER — SODIUM CHLORIDE 0.9 % IV SOLN
2.0000 g | Freq: Two times a day (BID) | INTRAVENOUS | Status: DC
  Filled 2017-07-30: qty 2000

## 2017-07-30 MED ORDER — SODIUM CHLORIDE 0.9 % IV SOLN: 2.0000 g | INTRAVENOUS | Status: DC

## 2017-07-30 MED ORDER — SODIUM CHLORIDE 0.9 % IV SOLN
3.0000 g | Freq: Two times a day (BID) | INTRAVENOUS | Status: DC
  Administered 2017-07-30 – 2017-08-04 (×9): 3 g via INTRAVENOUS
  Filled 2017-07-30 (×11): qty 3

## 2017-07-30 NOTE — Progress Notes (Signed)
PROGRESS NOTE    Patient: Nicole Cox     PCP: Joana Reamer, MD                    DOB: 1931-07-13            DOA: 07/27/2017 [id number]             DOS: 07/30/2017, 2:45 PM   LOS: 0 days   Date of Service: The patient was seen and examined on 07/30/2017  Subjective:   Patient was seen and examined this morning, afebrile, normotensive.   She was comfortably sitting in a chair, in no distress. Status post hemodialysis yesterday.  We informed the patient that we would like to pursue with TEE as her cultures are positive for enterococcus bacteria... Unfortunately would not be done until Monday. She expresses dissatisfaction, agrees to pursue with the procedure.  ----------------------------------------------------------------------------------------------------------------------  Brief Narrative:  Nicole Cox is a 84 y.o. female with history of ESRD on hemodialysis, hypertension, CAD, diabetes mellitus and previous stroke was brought to the ER after patient was found to be weak.  As per the daughter patient has been getting weak last 2 days.  Following dialysis patient had progressive weakness was brought to the ED for further evaluation.  She was found to have a temperature of 102.0,  But the initial criteria for SIRS, borderline hypotensive.  Subsequently admitted, cultures were obtained, initiated on spectrum antibiotics  Principal Problem:   SIRS (systemic inflammatory response syndrome) (HCC) Active Problems:   Hypertension   DM (diabetes mellitus), type 2 with renal complications (HCC)   CAD (coronary artery disease)   ESRD on hemodialysis (HCC)   Chronic anemia   Assessment & Plan:   SIRS/possible bacteremia - -1/2  blood cultures growing enterococcus -Currently afebrile, normotensive -Admission patient was on vancomycin and Zosyn,  -DC Zosyn, continue vancomycin with HD -Repeated blood cultures obtained, infectious disease team following -Recommending to  pursue with TEE  -cardiology team consulted reported cannot be completed until Monday.  [facility] - on hemodialysis on Tuesday Thursday and Saturday.  [place] -On hemodialysis today.   -Closely follow respiratory status and metabolic panel. -Nephrology -continuing with hemodialysis   Diabetes mellitus type  II -on Lantus insulin 5 units at bedtime with sliding scale coverage at this time.  Patient has had previous episodes of hypoglycemia so closely follow CBGs.  Hypertension - on clonidine patch, nifedipine lisinopril, Imdur and metoprolol.  History of chronic anemia/chronic disease, CKD -Monitoring H&H closely   History of CAD  -denies any chest pain.   -on statins beta-blockers Imdur.  Previous history of stroke  -presently appears nonfocal CT head unremarkable  Consultants:   Nephro Kathrene Bongo   ID   Procedures:  Dialysis    Antimicrobials:  Anti-infectives (From admission, onward)   Start     Dose/Rate Route Frequency Ordered Stop   07/30/17 2200  ampicillin (OMNIPEN) 2 g in sodium chloride 0.9 % 100 mL IVPB  Status:  Discontinued     2 g 300 mL/hr over 20 Minutes Intravenous Every 24 hours 07/30/17 1142 07/30/17 1246   07/30/17 2200  ampicillin (OMNIPEN) 2 g in sodium chloride 0.9 % 100 mL IVPB     2 g 300 mL/hr over 20 Minutes Intravenous 2 times daily 07/30/17 1246     07/29/17 1200  vancomycin (VANCOCIN) 500 mg in sodium chloride 0.9 % 100 mL IVPB  Status:  Discontinued     500 mg 100 mL/hr over 60  Minutes Intravenous Every T-Th-Sa (Hemodialysis) 07/28/17 0118 07/30/17 1142   07/29/17 1130  vancomycin (VANCOCIN) 500-5 MG/100ML-% IVPB    Note to Pharmacy:  Cay Schillings   : cabinet override      07/29/17 1130 07/29/17 1130   07/28/17 0130  piperacillin-tazobactam (ZOSYN) IVPB 3.375 g  Status:  Discontinued     3.375 g 12.5 mL/hr over 240 Minutes Intravenous Every 12 hours 07/28/17 0118 07/28/17 1325   07/28/17 0115  piperacillin-tazobactam (ZOSYN) IVPB  3.375 g  Status:  Discontinued     3.375 g 100 mL/hr over 30 Minutes Intravenous  Once 07/28/17 0108 07/28/17 0117   07/27/17 2145  ceFEPIme (MAXIPIME) 2 g in sodium chloride 0.9 % 100 mL IVPB     2 g 200 mL/hr over 30 Minutes Intravenous  Once 07/27/17 2134 07/27/17 2248   07/27/17 2145  vancomycin (VANCOCIN) IVPB 1000 mg/200 mL premix     1,000 mg 200 mL/hr over 60 Minutes Intravenous  Once 07/27/17 2134 07/28/17 0135        Objective:     Intake/Output Summary (Last 24 hours) at 07/30/2017 1445 Last data filed at 07/30/2017 1444 Gross per 24 hour  Intake 494 ml  Output 2 ml  Net 492 ml   Filed Weights   07/29/17 0821 07/29/17 1230 07/29/17 2014  Weight: 60 kg (132 lb 4.4 oz) 62 kg (136 lb 11 oz) 59 kg (130 lb 1.1 oz)     BP (!) 183/61 (BP Location: Right Arm)   Pulse 82   Temp 98.4 F (36.9 C) (Oral)   Resp 20   Ht  (1.6 m)   Wt 59 kg (130 lb 1.1 oz)   SpO2 93%   BMI 23.04 kg/m    Physical Exam  Constitution:  Alert, cooperative, no distress,  Psychiatric: Normal and stable mood and affect, cognition intact,   HEENT: Normocephalic, PERRL, otherwise with in Normal limits  Cardio vascular:  S1/S2, RRR, No murmure, No Rubs or Gallops  Chest/pulmonary: Clear to auscultation bilaterally, respirations unlabored  Chest symmetric Abdomen: Soft, non-tender, non-distended, bowel sounds,no masses, no organomegaly Muscular skeletal: Limited exam - in bed, able to move all 4 extremities, Normal strength,  Neuro: CNII-XII intact. , normal motor and sensation, reflexes intact  Extremities: No pitting edema lower extremities, +2 pulses  Skin: Dry, warm to touch, negative for any Rashes, No open wounds      Data Reviewed: I have personally reviewed following labs and imaging studies  CBC: Recent Labs  Lab 07/27/17 1911 07/28/17 0203 07/29/17 0858  WBC 12.0* 9.5 8.6  NEUTROABS 10.2* 7.4  --   HGB 11.8* 10.8* 10.5*  HCT 38.3 35.9* 34.3*  MCV 91.4 92.3 91.0    PLT 277 247 231   Basic Metabolic Panel: Recent Labs  Lab 07/27/17 1911 07/28/17 0203 07/29/17 0858  NA 139 139 140  K 4.1 4.1 4.5  CL 101 100* 100*  CO2 GLUCOSE 146* 197* 145*  BUN 18 23* 44*  CREATININE 4.88* 5.38* 7.40*  CALCIUM 8.3* 8.0* 8.1*  PHOS  --   --  5.8*   GFR: Estimated Creatinine Clearance: 4.8 mL/min (A) (by C-G formula based on SCr of 7.4 mg/dL (H)). Liver Function Tests: Recent Labs  Lab 07/27/17 1911 07/28/17 0203 07/29/17 0858  AST 47* 54*  --   ALT 39 43  --   ALKPHOS 124 113  --   BILITOT 0.9 0.7  --   PROT 7.5  6.9  --   ALBUMIN 2.9* 2.7* 2.4*  Coagulation Profile: Recent Labs  Lab 07/27/17 1911  INR 1.09   Cardiac Enzymes: Recent Labs  Lab 07/28/17 0203  CKTOTAL 129  TROPONINI 0.07*   BNP (last 3 results) No results for input(s): PROBNP in the last 8760 hours. HbA1C: No results for input(s): HGBA1C in the last 72 hours. CBG: Recent Labs  Lab 07/29/17 1357 07/29/17 1624 07/29/17 2112 07/30/17 0739 07/30/17 1241  GLUCAP 91 220* 112* 114* 132*  Thyroid Function Tests: Recent Labs    07/28/17 0203  TSH 1.590    Recent Labs  Lab 07/27/17 1924 07/28/17 0203  PROCALCITON  --  1.89  LATICACIDVEN 1.72 1.8       Radiology Studies: No results found.  Scheduled Meds: . calcium acetate  1,334 mg Oral TID WC  . Chlorhexidine Gluconate Cloth  6 each Topical Q0600  . [START ON 08/04/2017] cloNIDine  0.2 mg Transdermal Q7 days  . doxercalciferol  2.5 mcg Oral Q T,Th,Sa-HD  . feeding supplement (NEPRO CARB STEADY)  237 mL Oral BID BM  . fluticasone furoate-vilanterol  1 puff Inhalation Daily  . heparin  5,000 Units Subcutaneous Q8H  . insulin aspart  0-9 Units Subcutaneous TID WC  . insulin glargine  5 Units Subcutaneous QHS  . isosorbide mononitrate  30 mg Oral QPM  . lisinopril  40 mg Oral QPM  . metoprolol tartrate  12.5 mg Oral BID  . mupirocin ointment  1 application Nasal BID  . NIFEdipine  60 mg Oral  BID  . nitroGLYCERIN  0.4 mg Sublingual See admin instructions  . pravastatin  40 mg Oral Daily  . tiotropium  1 capsule Inhalation Daily   Continuous Infusions: . ampicillin (OMNIPEN) IV      Time spent: >25 minutes  Kendell Bane, MD [facility],  Pager [phone]  If 7PM-7AM, please contact night-coverage [email or web address]   Password [email or web address]  07/30/2017, 2:45 PM

## 2017-07-30 NOTE — Progress Notes (Signed)
Subjective:   no temp overnight-  Enterococcus faecalis- sens to amp and vanc - she feels fine- ID wants to do TEE- pt nervous  Objective Vital signs in last 24 hours: Vitals:   07/29/17 2014 07/30/17 0514 07/30/17 0917 07/30/17 1029  BP: (!) 175/53 (!) 162/65  (!) 183/61  Pulse: 84 73  82  Resp: Temp: 99 F (37.2 C) 98 F (36.7 C)  98.4 F (36.9 C)  TempSrc: Oral Oral  Oral  SpO2: 100% 100% 92% 93%  Weight: 59 kg (130 lb 1.1 oz)     Height:       Weight change: -2.8 kg (-6 lb 2.8 oz)  Intake/Output Summary (Last 24 hours) at 07/30/2017 1303 Last data filed at 07/30/2017 1030 Gross per 24 hour  Intake 480 ml  Output 2 ml  Net 478 ml    Dialyzes at [facility]-Dr. Richard Miu hours EDW 129 pounds. HD Bath 2/2.5, Dialyzer 180, Heparin 2000 bolus, then with hourly rate . Access AVG. 15-gauge blood flow rate 400 on Mircera 130 mcg to q. weeks, last given on 5/9.  Hectorol 2.5 q. treatment   Assessment/Plan: 84 year old black female with ESRD presented with weakness after dialysis-and found to have enterococcal bacteremia 1 Enterococcal bacteremia-has responded fairly quickly to antibiotic therapy, feels to be at her baseline.  ID is involved.  Unknown source of infection.  AV graft does not appear to be the source.  Outside possibility is that she was inoculated with the bacteria at the time of her hemodialysis treatment.  Would be great if can find antibiotic that she could get in HD- seems like can change to vanc when is OP.  I would just pick a course of 4 weeks and not put her thru a TEE- can get follow up blood cultures at her HD unit and then she could be discharged now 2 ESRD: Normally TTS [facility] followed by Dr. Jackalyn Lombard via AVG.  We will plan for her routine dialysis tomorrow /Sat if still here 3 Hypertension: Blood pressure seems fine.-She is on a significant regimen of clonidine patch, lisinopril, Lopressor, nifedipine-volume status seems fine.  Unclear why she needs  so many blood pressure medications- have taken clonidine down to 0.2 - can challenge EDW with HD tomorrow if still here  4. Anemia of ESRD: Hemoglobin actually pretty good.  She received her Mircera on 5/9.  No plans for dosing further here 5. Bones-  We will continue with Hectorol 2.5 q. treatment.  She is on PhosLo and that has been continued     Giovanne Nickolson A    Labs: Basic Metabolic Panel: Recent Labs  Lab 07/27/17 1911 07/28/17 0203 07/29/17 0858  NA 139 139 140  K 4.1 4.1 4.5  CL 101 100* 100*  CO2 GLUCOSE 146* 197* 145*  BUN 18 23* 44*  CREATININE 4.88* 5.38* 7.40*  CALCIUM 8.3* 8.0* 8.1*  PHOS  --   --  5.8*   Liver Function Tests: Recent Labs  Lab 07/27/17 1911 07/28/17 0203 07/29/17 0858  AST 47* 54*  --   ALT 39 43  --   ALKPHOS 124 113  --   BILITOT 0.9 0.7  --   PROT 7.5 6.9  --   ALBUMIN 2.9* 2.7* 2.4*   No results for input(s): LIPASE, AMYLASE in the last 168 hours. No results for input(s): AMMONIA in the last 168 hours. CBC: Recent Labs  Lab 07/27/17 1911 07/28/17  0203 07/29/17 0858  WBC 12.0* 9.5 8.6  NEUTROABS 10.2* 7.4  --   HGB 11.8* 10.8* 10.5*  HCT 38.3 35.9* 34.3*  MCV 91.4 92.3 91.0  PLT 277 247 231   Cardiac Enzymes: Recent Labs  Lab 07/28/17 0203  CKTOTAL 129  TROPONINI 0.07*   CBG: Recent Labs  Lab 07/29/17 1357 07/29/17 1624 07/29/17 2112 07/30/17 0739 07/30/17 1241  GLUCAP 91 220* 112* 114* 132*    Iron Studies: No results for input(s): IRON, TIBC, TRANSFERRIN, FERRITIN in the last 72 hours. Studies/Results: No results found. Medications: Infusions: . ampicillin (OMNIPEN) IV      Scheduled Medications: . calcium acetate  1,334 mg Oral TID WC  . Chlorhexidine Gluconate Cloth  6 each Topical Q0600  . [START ON 08/04/2017] cloNIDine  0.2 mg Transdermal Q7 days  . doxercalciferol  2.5 mcg Oral Q T,Th,Sa-HD  . feeding supplement (NEPRO CARB STEADY)  237 mL Oral BID BM  . fluticasone  furoate-vilanterol  1 puff Inhalation Daily  . heparin  5,000 Units Subcutaneous Q8H  . insulin aspart  0-9 Units Subcutaneous TID WC  . insulin glargine  5 Units Subcutaneous QHS  . isosorbide mononitrate  30 mg Oral QPM  . lisinopril  40 mg Oral QPM  . metoprolol tartrate  12.5 mg Oral BID  . mupirocin ointment  1 application Nasal BID  . NIFEdipine  60 mg Oral BID  . nitroGLYCERIN  0.4 mg Sublingual See admin instructions  . pravastatin  40 mg Oral Daily  . tiotropium  1 capsule Inhalation Daily    have reviewed scheduled and prn medications.  Physical Exam: General: NAD Heart: RRR- HR 60-s Lungs: mostly clear Abdomen: soft,  Nontender Extremities: no edema Dialysis Access: left AVG- no issues    07/30/2017,1:03 PM  LOS: 0 days

## 2017-07-30 NOTE — Progress Notes (Signed)
[facility]    Date of Admission:  07/27/2017   Total days of antibiotics 4        Day 1 ampicillin           ID: Nicole Cox is a 84 y.o. female with SIRS, ESRD found to have enterococcal bacteremia Principal Problem:   SIRS (systemic inflammatory response syndrome) (HCC) Active Problems:   Hypertension   DM (diabetes mellitus), type 2 with renal complications (HCC)   CAD (coronary artery disease)   ESRD on hemodialysis (HCC)   Chronic anemia    Subjective: Afebrile, ate breakfast without difficulty. Had 2 loose BM this morning. She is feeling back to her baseline.  Blood cx now showing enterococcus plus acinetobacter  Medications:  . calcium acetate  1,334 mg Oral TID WC  . Chlorhexidine Gluconate Cloth  6 each Topical Q0600  . [START ON 08/04/2017] cloNIDine  0.2 mg Transdermal Q7 days  . doxercalciferol  2.5 mcg Oral Q T,Th,Sa-HD  . feeding supplement (NEPRO CARB STEADY)  237 mL Oral BID BM  . fluticasone furoate-vilanterol  1 puff Inhalation Daily  . heparin  5,000 Units Subcutaneous Q8H  . insulin aspart  0-9 Units Subcutaneous TID WC  . insulin glargine  5 Units Subcutaneous QHS  . isosorbide mononitrate  30 mg Oral QPM  . lisinopril  40 mg Oral QPM  . metoprolol tartrate  12.5 mg Oral BID  . mupirocin ointment  1 application Nasal BID  . NIFEdipine  60 mg Oral BID  . nitroGLYCERIN  0.4 mg Sublingual See admin instructions  . pravastatin  40 mg Oral Daily  . tiotropium  1 capsule Inhalation Daily    Objective: Vital signs in last 24 hours: Temp:  [98 F (36.7 C)-99 F (37.2 C)] 98.4 F (36.9 C) (05/17 1029) Pulse Rate:  [73-84] 82 (05/17 1029) Resp:  [15-20] 20 (05/17 1029) BP: (162-183)/(53-65) 183/61 (05/17 1029) SpO2:  [92 %-100 %] 93 % (05/17 1029) Weight:  [130 lb 1.1 oz (59 kg)] 130 lb 1.1 oz (59 kg) (05/16 2014)  Physical Exam  Constitutional:  oriented to person, place.Marland Kitchen appears stated age, chronically illand  well-nourished. No distress.  HENT: [place]/AT, PERRLA, no scleral icterus Mouth/Throat: Oropharynx is clear and moist. No oropharyngeal exudate.  Cardiovascular: Normal rate, regular rhythm and normal heart sounds. Exam reveals no gallop and no friction rub.  No murmur heard.  Pulmonary/Chest: Effort normal and breath sounds normal. No respiratory distress.  has no wheezes.  Neck = supple, no nuchal rigidity Abdominal: Soft. Bowel sounds are normal.  exhibits no distension. There is no tenderness.  Lymphadenopathy: no cervical adenopathy. No axillary adenopathy Neurological: alert and oriented to person, place, and time.  Skin: Skin is warm and dry. No rash noted. No erythema.  Psychiatric: a normal mood and affect.  behavior is normal.   Lab Results Recent Labs    07/28/17 0203 07/29/17 0858  WBC 9.5 8.6  HGB 10.8* 10.5*  HCT 35.9* 34.3*  NA 139 140  K 4.1 4.5  CL 100* 100*  CO2 25 24  BUN 23* 44*  CREATININE 5.38* 7.40*   Liver Panel Recent Labs    07/27/17 1911 07/28/17 0203 07/29/17 0858  PROT 7.5 6.9  --   ALBUMIN 2.9* 2.7* 2.4*  AST 47* 54*  --   ALT 39 43  --   ALKPHOS 124 113  --   BILITOT 0.9 0.7  --     Microbiology:  5/14 blood cx amp S enterococcus x 2 sets 5/15 blood cx pending  Studies/Results: [place] of AVG is not showing any fluid collection  Assessment/Plan: Polymicrobial bacteremia with Enterococcus species and acinetobacter in ESRD patient = source unclear, though she now reports having diarrhea. She appears to have resolved quickly, repeat blood cx is NGTD at 48hrs.  -plan on getting TTE (not TEE) to start. If repeat blood cx are negative, then would treat for 2 wks. For now, need to determine what abtx to treat acinetobacter.   - recommend to treat with amp/sub for the time being. Will try to devise regimen that she can receive in HD but we are awaiting acinetobactere sensitivities.  [facility] Cell:  [phone] Pager: [phone]  07/30/2017, 2:47 PM

## 2017-07-30 NOTE — Progress Notes (Signed)
Physical Therapy Treatment Patient Details Name: Nicole Cox MRN: [id number] DOB: October 09, 1931 Today's Date: 07/30/2017    History of Present Illness Nicole Cox is a 84 y.o. female with a history of SIRS, ESRD, HTN, COPD, CAD, HTN, hx CVA who presented to the ED for sudden onset of dyspnea the morning of admission. She reported waking up this morning with severe constant shortness of breath and orthopnea. She initially had wheezing, improved with breathing treatment at home but symptoms persisted. She required CPAP by EMS and was found to be in respiratory distress improved with supplemental oxygen.     PT Comments    Patient progressing towards therapy goals. Able to transition from sit to stand with RW/min guard assist and march in place. Further gait deferred due patient declining secondary to fatigue from sitting up in chair all morning. Patient states she continues to have "bouncing," when moving but none noted this session. Rest of session focused on functional strengthening. Continue to recommend short term rehab prior to discharge home to maximize functional independence and decrease caregiver burden.    Follow Up Recommendations  SNF;Supervision/Assistance - 24 hour     Equipment Recommendations       Recommendations for Other Services       Precautions / Restrictions Precautions Precautions: Fall Restrictions Weight Bearing Restrictions: No    Mobility  Bed Mobility Overal bed mobility: Modified Independent Bed Mobility: Supine to Sit     Supine to sit: Modified independent (Device/Increase time)     General bed mobility comments: HOB elevated  Transfers Overall transfer level: Needs assistance Equipment used: Rolling walker (2 wheeled) Transfers: Sit to/from Stand Sit to Stand: Min guard         General transfer comment: Min guard for sit to stands from bed to RW. Increased time to achieve upright positioning. Prefers to push up with both hands on RW  while PT stabilized   Ambulation/Gait Ambulation/Gait assistance: Min guard   Assistive device: Rolling walker (2 wheeled)       General Gait Details: Marching in place with RW x 5. Patient deferring further gait at this time   Stairs             Wheelchair Mobility    Modified Rankin (Stroke Patients Only)       Balance Overall balance assessment: Needs assistance Sitting-balance support: No upper extremity supported Sitting balance-Leahy Scale: Fair     Standing balance support: Bilateral upper extremity supported Standing balance-Leahy Scale: Poor Standing balance comment: reliant on UE support                            Cognition Arousal/Alertness: Awake/alert Behavior During Therapy: WFL for tasks assessed/performed Overall Cognitive Status: Within Functional Limits for tasks assessed                                 General Comments: Pt answers questions appropriately but slowly      Exercises General Exercises - Lower Extremity Long Arc Quad: 20 reps;Both;Seated Hip Flexion/Marching: 20 reps;Both;Seated Other Exercises Other Exercises: 5 sit to stands from bed to RW for functional strengthening Other Exercises: Tactile cueing for anterior pelvic tilt in sitting    General Comments        Pertinent Vitals/Pain Pain Assessment: Faces Faces Pain Scale: Hurts a little bit Pain Location: R hand (IV site) Pain Descriptors / Indicators: Discomfort  Pain Intervention(s): Other (comment)(RN aware)    Home Living                      Prior Function            PT Goals (current goals can now be found in the care plan section) Acute Rehab PT Goals Patient Stated Goal: To return home PT Goal Formulation: With patient Time For Goal Achievement: 08/11/17 Potential to Achieve Goals: Fair Progress towards PT goals: Progressing toward goals    Frequency    Min 2X/week      PT Plan Current plan remains  appropriate    Co-evaluation              AM-PAC PT "6 Clicks" Daily Activity  Outcome Measure  Difficulty turning over in bed (including adjusting bedclothes, sheets and blankets)?: None Difficulty moving from lying on back to sitting on the side of the bed? : A Little Difficulty sitting down on and standing up from a chair with arms (e.g., wheelchair, bedside commode, etc,.)?: A Little Help needed moving to and from a bed to chair (including a wheelchair)?: A Little Help needed walking in hospital room?: A Lot Help needed climbing 3-5 steps with a railing? : Total 6 Click Score: 16    End of Session Equipment Utilized During Treatment: Gait belt Activity Tolerance: Patient tolerated treatment well Patient left: in bed;with call bell/phone within reach Nurse Communication: Mobility status PT Visit Diagnosis: Unsteadiness on feet (R26.81);Other abnormalities of gait and mobility (R26.89);History of falling (Z91.81)     Time: 1610-9604 PT Time Calculation (min) (ACUTE ONLY): 16 min  Charges:  $Therapeutic Exercise: 8-22 mins                    G Codes:       Laurina Bustle, PT, DPT Acute Rehabilitation Services  Pager: [phone]    Vanetta Mulders 07/30/2017, 2:49 PM

## 2017-07-30 NOTE — Progress Notes (Addendum)
Pharmacy Antibiotic Note  Fizza Scales is a 84 y.o. female admitted on 07/27/2017 with enterococcus faecalis bacteremia.  Sensitivities resulted and it is pan-sensitive, including to ampicillin, so the vancomycin was transitioned off.  ECHO TEE planned today (5/17) to rule out endocarditis. Source unknown. Afebrile, WBC wnl, stable.  Addendum: Second blood culture set on 5/14 is growing Acinetobacter. Will switch from ampicillin to ampicillin/sulbactam for empiric coverage.  Plan: Stop vancomycin and ampicillin Start ampicillin/sulbactam (Unasyn) 3g Q12hrs Follow TEE results, repeat cultures, monitor clinical status, LOT   Height:  (160 cm) Weight: 130 lb 1.1 oz (59 kg) IBW/kg (Calculated) : 52.4  Temp (24hrs), Avg:98.5 F (36.9 C), Min:98 F (36.7 C), Max:99 F (37.2 C)  Recent Labs  Lab 07/27/17 1911 07/27/17 1924 07/28/17 0203 07/29/17 0858  WBC 12.0*  --  9.5 8.6  CREATININE 4.88*  --  5.38* 7.40*  LATICACIDVEN  --  1.72 1.8  --     Estimated Creatinine Clearance: 4.8 mL/min (A) (by C-G formula based on SCr of 7.4 mg/dL (H)).    No Known Allergies  Antimicrobials this admission: Ampicillin 5/17>> Vanc 5/14 >>5/17 * Doses: 1g (5/14 @ 2145) 500 mg (5/16 @ 1130) Cefepime 5/14 x 1 >> Zosyn 5/15 x1  Microbiology results: 5/14 BCx >> 2/2 E faecalis (pan-S) 5/15 MRSA PCR >> positive 5/15 BCx x 1 >> ngtd   Nolen Mu PharmD PGY1 Pharmacy Practice Resident 07/30/2017 11:52 AM Pager: [phone]

## 2017-07-30 NOTE — Progress Notes (Signed)
    [facility] has been requested to perform a transesophageal echocardiogram on Nicole Cox for Bactermia.  After careful review of history and examination, the risks and benefits of transesophageal echocardiogram have been explained including risks of esophageal damage, perforation (1:10,000 risk), bleeding, pharyngeal hematoma as well as other potential complications associated with conscious sedation including aspiration, arrhythmia, respiratory failure and death. Alternatives to treatment were discussed, questions were answered.   Pt would like to discuss with all her children prior to decision.   I discussed with her daughter Efraim Kaufmann -- she will give answer on 07/31/17    Nada Boozer, NP  07/30/2017 3:31 PM

## 2017-07-30 NOTE — Progress Notes (Signed)
PHARMACY - PHYSICIAN COMMUNICATION CRITICAL VALUE ALERT - BLOOD CULTURE IDENTIFICATION (BCID)  Nicole Cox is an 84 y.o. female who presented to [facility] on 07/27/2017 with a chief complaint of weakness.   Assessment:  Update on previous blood culture. 1 blood culture is now growing Acinetobacter baumanii complex in addition to enterococcus. Coag negative staph has also been visualized on plate per miccrobiology.   Name of physician (or Provider) Contacted: [facility]  Current antibiotics:  Ampicillin   Changes to prescribed antibiotics recommended:  Switch to Unasyn- Discussed with Dr. Drue Second  Results for orders placed or performed during the hospital encounter of 07/27/17  Blood Culture ID Panel (Reflexed) (Collected: 07/27/2017  7:13 PM)  Result Value Ref Range   Enterococcus species DETECTED (A) NOT DETECTED   Vancomycin resistance NOT DETECTED NOT DETECTED   Listeria monocytogenes NOT DETECTED NOT DETECTED   Staphylococcus species NOT DETECTED NOT DETECTED   Staphylococcus aureus NOT DETECTED NOT DETECTED   Streptococcus species NOT DETECTED NOT DETECTED   Streptococcus agalactiae NOT DETECTED NOT DETECTED   Streptococcus pneumoniae NOT DETECTED NOT DETECTED   Streptococcus pyogenes NOT DETECTED NOT DETECTED   Acinetobacter baumannii NOT DETECTED NOT DETECTED   Enterobacteriaceae species NOT DETECTED NOT DETECTED   Enterobacter cloacae complex NOT DETECTED NOT DETECTED   Escherichia coli NOT DETECTED NOT DETECTED   Klebsiella oxytoca NOT DETECTED NOT DETECTED   Klebsiella pneumoniae NOT DETECTED NOT DETECTED   Proteus species NOT DETECTED NOT DETECTED   Serratia marcescens NOT DETECTED NOT DETECTED   Haemophilus influenzae NOT DETECTED NOT DETECTED   Neisseria meningitidis NOT DETECTED NOT DETECTED   Pseudomonas aeruginosa NOT DETECTED NOT DETECTED   Candida albicans NOT DETECTED NOT DETECTED   Candida glabrata NOT DETECTED NOT DETECTED   Candida krusei NOT DETECTED  NOT DETECTED   Candida parapsilosis NOT DETECTED NOT DETECTED   Candida tropicalis NOT DETECTED NOT DETECTED   Sharin Mons, PharmD, BCPS PGY2 Infectious Diseases Pharmacy Resident Pager: [phone]  07/30/2017  2:58 PM

## 2017-07-31 DIAGNOSIS — D649 Anemia, unspecified: Secondary | ICD-10-CM

## 2017-07-31 LAB — GLUCOSE, CAPILLARY
Glucose-Capillary: 101 mg/dL — ABNORMAL HIGH (ref 65–99)
Glucose-Capillary: 142 mg/dL — ABNORMAL HIGH (ref 65–99)
Glucose-Capillary: 164 mg/dL — ABNORMAL HIGH (ref 65–99)

## 2017-07-31 LAB — CULTURE, BLOOD (ROUTINE X 2): Special Requests: ADEQUATE

## 2017-07-31 MED ORDER — ASPIRIN EC 81 MG PO TBEC
81.0000 mg | DELAYED_RELEASE_TABLET | Freq: Every day | ORAL | Status: DC
  Administered 2017-07-31 – 2017-08-04 (×4): 81 mg via ORAL
  Filled 2017-07-31 (×4): qty 1

## 2017-07-31 NOTE — Progress Notes (Signed)
Subjective:   no temp overnight-  Enterococcus faecalis and now acinetobacter - sens to most - she feels fine- ID now saying could get TTE and follow up blood cultures   Objective Vital signs in last 24 hours: Vitals:   07/30/17 2030 07/31/17 0327 07/31/17 1013 07/31/17 1123  BP: (!) 158/61 (!) 175/56 (!) 165/51   Pulse: 77 71 70 77  Resp: Temp: 99.3 F (37.4 C) (!) 97.4 F (36.3 C) 98.8 F (37.1 C)   TempSrc: Oral Oral Oral   SpO2: 99% 97% 98% 96%  Weight: 61 kg (134 lb 7.7 oz)     Height:       Weight change: 1 kg (2 lb 3.3 oz)  Intake/Output Summary (Last 24 hours) at 07/31/2017 1250 Last data filed at 07/31/2017 0600 Gross per 24 hour  Intake 596 ml  Output 0 ml  Net 596 ml    Dialyzes at [facility]-Dr. Richard Miu hours EDW 129 pounds. HD Bath 2/2.5, Dialyzer 180, Heparin 2000 bolus, then with hourly rate . Access AVG. 15-gauge blood flow rate 400 on Mircera 130 mcg to q. weeks, last given on 5/9.  Hectorol 2.5 q. treatment   Assessment/Plan: 84 year old black female with ESRD presented with weakness after dialysis-and found to have enterococcal bacteremia 1 Enterococcal bacteremia-has responded fairly quickly to antibiotic therapy, feels to be at her baseline.  ID is involved.  Unknown source of infection.  AV graft does not appear to be the source.  Outside possibility is that she was inoculated with the bacteria at the time of her hemodialysis treatment.  Would be great if can find antibiotic that she could get in HD- ID now more open to not do TEE 2 ESRD: Normally TTS [facility] followed by Dr. Jackalyn Lombard via AVG.  We will plan for her routine dialysis today 3 Hypertension: Blood pressure seems fine.-She is on a significant regimen of clonidine patch, lisinopril, Lopressor, nifedipine-volume status seems fine.  Unclear why she needs so many blood pressure medications- have taken clonidine down to 0.2 - can challenge EDW with HD  4. Anemia of ESRD: Hemoglobin actually  pretty good.  She received her Mircera on 5/9.  No plans for dosing further here 5. Bones-  We will continue with Hectorol 2.5 q. treatment.  She is on PhosLo and that has been continued     Byrdie Miyazaki A    Labs: Basic Metabolic Panel: Recent Labs  Lab 07/27/17 1911 07/28/17 0203 07/29/17 0858  NA 139 139 140  K 4.1 4.1 4.5  CL 101 100* 100*  CO2 GLUCOSE 146* 197* 145*  BUN 18 23* 44*  CREATININE 4.88* 5.38* 7.40*  CALCIUM 8.3* 8.0* 8.1*  PHOS  --   --  5.8*   Liver Function Tests: Recent Labs  Lab 07/27/17 1911 07/28/17 0203 07/29/17 0858  AST 47* 54*  --   ALT 39 43  --   ALKPHOS 124 113  --   BILITOT 0.9 0.7  --   PROT 7.5 6.9  --   ALBUMIN 2.9* 2.7* 2.4*   No results for input(s): LIPASE, AMYLASE in the last 168 hours. No results for input(s): AMMONIA in the last 168 hours. CBC: Recent Labs  Lab 07/27/17 1911 07/28/17 0203 07/29/17 0858  WBC 12.0* 9.5 8.6  NEUTROABS 10.2* 7.4  --   HGB 11.8* 10.8* 10.5*  HCT 38.3 35.9* 34.3*  MCV 91.4 92.3 91.0  PLT 277 247 231  Cardiac Enzymes: Recent Labs  Lab 07/28/17 0203  CKTOTAL 129  TROPONINI 0.07*   CBG: Recent Labs  Lab 07/30/17 0739 07/30/17 1241 07/30/17 1631 07/30/17 2033 07/31/17 1136  GLUCAP 114* 132* 116* 175* 101*    Iron Studies: No results for input(s): IRON, TIBC, TRANSFERRIN, FERRITIN in the last 72 hours. Studies/Results: No results found. Medications: Infusions: . ampicillin-sulbactam (UNASYN) IV Stopped (07/31/17 1059)    Scheduled Medications: . calcium acetate  1,334 mg Oral TID WC  . Chlorhexidine Gluconate Cloth  6 each Topical Q0600  . [START ON 08/04/2017] cloNIDine  0.2 mg Transdermal Q7 days  . doxercalciferol  2.5 mcg Oral Q T,Th,Sa-HD  . feeding supplement (NEPRO CARB STEADY)  237 mL Oral BID BM  . fluticasone furoate-vilanterol  1 puff Inhalation Daily  . heparin  5,000 Units Subcutaneous Q8H  . insulin aspart  0-9 Units Subcutaneous TID  WC  . insulin glargine  5 Units Subcutaneous QHS  . isosorbide mononitrate  30 mg Oral QPM  . lisinopril  40 mg Oral QPM  . metoprolol tartrate  12.5 mg Oral BID  . mupirocin ointment  1 application Nasal BID  . NIFEdipine  60 mg Oral BID  . nitroGLYCERIN  0.4 mg Sublingual See admin instructions  . pravastatin  40 mg Oral Daily  . tiotropium  1 capsule Inhalation Daily    have reviewed scheduled and prn medications.  Physical Exam: General: NAD Heart: RRR- HR 60-s Lungs: mostly clear Abdomen: soft,  Nontender Extremities: no edema Dialysis Access: left AVG- no issues    07/31/2017,12:50 PM  LOS: 1 day

## 2017-07-31 NOTE — Clinical Social Work Note (Signed)
Clinical Social Work Assessment  Patient Details  Name: Nicole Cox MRN: [id number] Date of Birth: 1931/12/20  Date of referral:  07/31/17               Reason for consult:  Facility Placement                Permission sought to share information with:  Family Supports Permission granted to share information::     Name::     NWGNFAO  Agency::  [facility]  Relationship::  Daughter  Contact Information:  [phone]  Housing/Transportation Living arrangements for the past 2 months:  Single Family Home Source of Information:  Adult Children Patient Interpreter Needed:  None Criminal Activity/Legal Involvement Pertinent to Current Situation/Hospitalization:  No - Comment as needed Significant Relationships:  Adult Children Lives with:  Self Do you feel safe going back to the place where you live?  (Pt not alert and oriented) Need for family participation in patient care:  No (Coment)  Care giving concerns:  Pt is only alert to self and place. CSW spoke with pt's daughter Nicole Cox via telephone.   Social Worker assessment / plan:  CSW spoke with pt's daughter via telephone. Pt lives alone. Pt's daughter is unsure of what pt will think about going to SNF. CSW explained that pt is only alert to self and place and CSW typically reaches out to family to determine dispo plan. Pt's daughter wants to talk to her mom about going to SNF however, pt's daughter is agreeable to [place] and [place] f/o. CSW to follow up with pt's daughter.   Employment status:  Retired Database administrator PT Recommendations:  Skilled Nursing Facility Information / Referral to community resources:  Skilled Nursing Facility  Patient/Family's Response to care:  Pt's daughter verbalized understanding of CSW role and expressed appreciation for support. Pt daughter denies any concern regarding pt care at this time.   Patient/Family's Understanding of and Emotional  Response to Diagnosis, Current Treatment, and Prognosis:  Pt's daughter understanding and realistic regarding physical limitations. Pt's daughter understands the need for SNF placement at d/c--pt's daughter unsure of what pt will think. CSW will continue to provide support and facilitate d/c needs.   Emotional Assessment Appearance:  Appears stated age Attitude/Demeanor/Rapport:  Unable to Assess Affect (typically observed):  Unable to Assess Orientation:  Oriented to Self, Oriented to Place Alcohol / Substance use:  Not Applicable Psych involvement (Current and /or in the community):  No (Comment)  Discharge Needs  Concerns to be addressed:  Basic Needs, Care Coordination Readmission within the last 30 days:  No Current discharge risk:  Dependent with Mobility Barriers to Discharge:  Continued Medical Work up   [employer], LCSW 07/31/2017, 4:39 PM

## 2017-07-31 NOTE — Progress Notes (Signed)
[facility] PROGRESS NOTE  Nicole Cox ZOX:[id number] DOB: [birth year] DOA: 07/27/2017  PCP: Joana Reamer, MD  Brief History/Interval Summary: Nicole Cox a 84 y.o.femalewithhistory of ESRD on hemodialysis, hypertension, CAD, diabetes mellitus and previous stroke was brought to the ER after patient was found to be weak. As per the daughter patient has been getting weak last 2 days.  Following dialysis patient had progressive weakness was brought to the ED for further evaluation.  She was found to have a temperature of 102.0,  But the initial criteria for SIRS, borderline hypotensive. Subsequently admitted, cultures were obtained, initiated on spectrum antibiotics  Reason for Visit: Enterococcus bacteremia  Consultants: Infectious disease nephrology  Procedures: Hemodialysis  Antibiotics: Previously on vancomycin and Zosyn.  Currently on Unasyn.  Subjective/Interval History: Patient feels well.  Denies any complaints whatsoever.  ROS: Eyes any chest pain shortness of breath nausea or vomiting  Objective:  Vital Signs  Vitals:   07/30/17 2030 07/31/17 0327 07/31/17 1013 07/31/17 1123  BP: (!) 158/61 (!) 175/56 (!) 165/51   Pulse: 77 71 70 77  Resp: Temp: 99.3 F (37.4 C) (!) 97.4 F (36.3 C) 98.8 F (37.1 C)   TempSrc: Oral Oral Oral   SpO2: 99% 97% 98% 96%  Weight: 61 kg (134 lb 7.7 oz)     Height:        Intake/Output Summary (Last 24 hours) at 07/31/2017 1250 Last data filed at 07/31/2017 0600 Gross per 24 hour  Intake 596 ml  Output 0 ml  Net 596 ml   Filed Weights   07/29/17 1230 07/29/17 2014 07/30/17 2030  Weight: 62 kg (136 lb 11 oz) 59 kg (130 lb 1.1 oz) 61 kg (134 lb 7.7 oz)    General appearance: alert, cooperative, appears stated age and no distress Head: Normocephalic, without obvious abnormality, atraumatic Resp: clear to auscultation bilaterally Cardio: regular rate and rhythm, S1, S2 normal, no murmur, click,  rub or gallop GI: soft, non-tender; bowel sounds normal; no masses,  no organomegaly Extremities: extremities normal, atraumatic, no cyanosis or edema Neurologic: No obvious focal neurological deficits.  Lab Results:  Data Reviewed: I have personally reviewed following labs and imaging studies  CBC: Recent Labs  Lab 07/27/17 1911 07/28/17 0203 07/29/17 0858  WBC 12.0* 9.5 8.6  NEUTROABS 10.2* 7.4  --   HGB 11.8* 10.8* 10.5*  HCT 38.3 35.9* 34.3*  MCV 91.4 92.3 91.0  PLT 277 247 231    Basic Metabolic Panel: Recent Labs  Lab 07/27/17 1911 07/28/17 0203 07/29/17 0858  NA 139 139 140  K 4.1 4.1 4.5  CL 101 100* 100*  CO2 GLUCOSE 146* 197* 145*  BUN 18 23* 44*  CREATININE 4.88* 5.38* 7.40*  CALCIUM 8.3* 8.0* 8.1*  PHOS  --   --  5.8*    GFR: Estimated Creatinine Clearance: 4.8 mL/min (A) (by C-G formula based on SCr of 7.4 mg/dL (H)).  Liver Function Tests: Recent Labs  Lab 07/27/17 1911 07/28/17 0203 07/29/17 0858  AST 47* 54*  --   ALT 39 43  --   ALKPHOS 124 113  --   BILITOT 0.9 0.7  --   PROT 7.5 6.9  --   ALBUMIN 2.9* 2.7* 2.4*    Coagulation Profile: Recent Labs  Lab 07/27/17 1911  INR 1.09    Cardiac Enzymes: Recent Labs  Lab 07/28/17 0203  CKTOTAL 129  TROPONINI 0.07*    CBG: Recent  Labs  Lab 07/30/17 0739 07/30/17 1241 07/30/17 1631 07/30/17 2033 07/31/17 1136  GLUCAP 114* 132* 116* 175* 101*     Recent Results (from the past 240 hour(s))  Culture, blood (Routine x 2)     Status: Abnormal (Preliminary result)   Collection Time: 07/27/17  7:13 PM  Result Value Ref Range Status   Specimen Description BLOOD RIGHT FOREARM  Final   Special Requests   Final    BOTTLES DRAWN AEROBIC AND ANAEROBIC Blood Culture adequate volume   Culture  Setup Time   Final    GRAM POSITIVE COCCI IN BOTH AEROBIC AND ANAEROBIC BOTTLES CRITICAL RESULT CALLED TO, READ BACK BY AND VERIFIED WITH: PHARMD E SINCLARE 07/28/17 1305 PM BY  MORAC    Culture (A)  Final    ENTEROCOCCUS FAECALIS SUSCEPTIBILITIES PERFORMED ON PREVIOUS CULTURE WITHIN THE LAST 5 DAYS. ACINETOBACTER CALCOACETICUS/BAUMANNII COMPLEX CRITICAL RESULT CALLED TO, READ BACK BY AND VERIFIED WITH: E SINCLAIR,PHARMD AT 1450 07/30/17 BY L BENFIELD CONCERNING GROWTH ON CULTURE Performed at [facility], [address], [place], [place] [address]    Report Status PENDING  Incomplete   Organism ID, Bacteria ACINETOBACTER CALCOACETICUS/BAUMANNII COMPLEX  Final      Susceptibility   Acinetobacter calcoaceticus/baumannii complex - MIC*    CEFTAZIDIME 4 SENSITIVE Sensitive     CEFTRIAXONE 16 INTERMEDIATE Intermediate     CIPROFLOXACIN <=0.25 SENSITIVE Sensitive     GENTAMICIN <=1 SENSITIVE Sensitive     IMIPENEM <=0.25 SENSITIVE Sensitive     PIP/TAZO <=4 SENSITIVE Sensitive     TRIMETH/SULFA <=20 SENSITIVE Sensitive     CEFEPIME 2 SENSITIVE Sensitive     AMPICILLIN/SULBACTAM <=2 SENSITIVE Sensitive     * ACINETOBACTER CALCOACETICUS/BAUMANNII COMPLEX  Blood Culture ID Panel (Reflexed)     Status: Abnormal   Collection Time: 07/27/17  7:13 PM  Result Value Ref Range Status   Enterococcus species DETECTED (A) NOT DETECTED Final    Comment: CRITICAL RESULT CALLED TO, READ BACK BY AND VERIFIED WITH: PHARMD E SINCLARE 07/28/17 AT 1305 BY CM    Vancomycin resistance NOT DETECTED NOT DETECTED Final   Listeria monocytogenes NOT DETECTED NOT DETECTED Final   Staphylococcus species NOT DETECTED NOT DETECTED Final   Staphylococcus aureus NOT DETECTED NOT DETECTED Final   Streptococcus species NOT DETECTED NOT DETECTED Final   Streptococcus agalactiae NOT DETECTED NOT DETECTED Final   Streptococcus pneumoniae NOT DETECTED NOT DETECTED Final   Streptococcus pyogenes NOT DETECTED NOT DETECTED Final   Acinetobacter baumannii NOT DETECTED NOT DETECTED Final   Enterobacteriaceae species NOT DETECTED NOT DETECTED Final   Enterobacter cloacae complex NOT DETECTED NOT  DETECTED Final   Escherichia coli NOT DETECTED NOT DETECTED Final   Klebsiella oxytoca NOT DETECTED NOT DETECTED Final   Klebsiella pneumoniae NOT DETECTED NOT DETECTED Final   Proteus species NOT DETECTED NOT DETECTED Final   Serratia marcescens NOT DETECTED NOT DETECTED Final   Haemophilus influenzae NOT DETECTED NOT DETECTED Final   Neisseria meningitidis NOT DETECTED NOT DETECTED Final   Pseudomonas aeruginosa NOT DETECTED NOT DETECTED Final   Candida albicans NOT DETECTED NOT DETECTED Final   Candida glabrata NOT DETECTED NOT DETECTED Final   Candida krusei NOT DETECTED NOT DETECTED Final   Candida parapsilosis NOT DETECTED NOT DETECTED Final   Candida tropicalis NOT DETECTED NOT DETECTED Final    Comment: Performed at [facility], [address] [place], [place] [address]  Culture, blood (Routine x 2)  Status: Abnormal   Collection Time: 07/27/17  7:18 PM  Result Value Ref Range Status   Specimen Description BLOOD RIGHT HAND  Final   Special Requests   Final    BOTTLES DRAWN AEROBIC AND ANAEROBIC Blood Culture adequate volume   Culture  Setup Time   Final    GRAM POSITIVE COCCI IN CHAINS IN BOTH AEROBIC AND ANAEROBIC BOTTLES CRITICAL VALUE NOTED.  VALUE IS CONSISTENT WITH PREVIOUSLY REPORTED AND CALLED VALUE. Performed at [facility], [address], [place], [place] [address]    Culture ENTEROCOCCUS FAECALIS (A)  Final   Report Status 07/30/2017 FINAL  Final   Organism ID, Bacteria ENTEROCOCCUS FAECALIS  Final      Susceptibility   Enterococcus faecalis - MIC*    AMPICILLIN <=2 SENSITIVE Sensitive     VANCOMYCIN 2 SENSITIVE Sensitive     GENTAMICIN SYNERGY SENSITIVE Sensitive     * ENTEROCOCCUS FAECALIS  MRSA PCR Screening     Status: Abnormal   Collection Time: 07/28/17  1:28 AM  Result Value Ref Range Status   MRSA by PCR POSITIVE (A) NEGATIVE Final    Comment:        The GeneXpert MRSA Assay (FDA approved for NASAL specimens only), is one  component of a comprehensive MRSA colonization surveillance program. It is not intended to diagnose MRSA infection nor to guide or monitor treatment for MRSA infections. RESULT CALLED TO, READ BACK BY AND VERIFIED WITH: Anson [place] RN 07/28/17 0358 JDW Performed at [facility], [address], [place], [place] [address]   Culture, blood (Routine X 2) w Reflex to ID Panel     Status: None (Preliminary result)   Collection Time: 07/28/17  3:37 PM  Result Value Ref Range Status   Specimen Description BLOOD RIGHT ANTECUBITAL  Final   Special Requests   Final    BOTTLES DRAWN AEROBIC ONLY Blood Culture adequate volume   Culture   Final    NO GROWTH 3 DAYS Performed at [facility], [address], [place], [place] [address]    Report Status PENDING  Incomplete      Radiology Studies: No results found.   Medications:  Scheduled: . calcium acetate  1,334 mg Oral TID WC  . Chlorhexidine Gluconate Cloth  6 each Topical Q0600  . [START ON 08/04/2017] cloNIDine  0.2 mg Transdermal Q7 days  . doxercalciferol  2.5 mcg Oral Q T,Th,Sa-HD  . feeding supplement (NEPRO CARB STEADY)  237 mL Oral BID BM  . fluticasone furoate-vilanterol  1 puff Inhalation Daily  . heparin  5,000 Units Subcutaneous Q8H  . insulin aspart  0-9 Units Subcutaneous TID WC  . insulin glargine  5 Units Subcutaneous QHS  . isosorbide mononitrate  30 mg Oral QPM  . lisinopril  40 mg Oral QPM  . metoprolol tartrate  12.5 mg Oral BID  . mupirocin ointment  1 application Nasal BID  . NIFEdipine  60 mg Oral BID  . nitroGLYCERIN  0.4 mg Sublingual See admin instructions  . pravastatin  40 mg Oral Daily  . tiotropium  1 capsule Inhalation Daily   Continuous: . ampicillin-sulbactam (UNASYN) IV Stopped (07/31/17 1059)   VQQ:[id number] **OR** acetaminophen, albuterol, ondansetron **OR** ondansetron (ZOFRAN) IV, traMADol, traZODone  Assessment/Plan:    Bacteremia One set of blood culture positive for  enterococcus.  Other set is positive for Acinetobacter.  Etiology remains unclear.  Patient was on vancomycin and Zosyn.  Changed over to Unasyn  by infectious disease.  TEE has been recommended and to be done on Monday.  She is afebrile.  Her WBC was normal on the 16th.  Repeat labs tomorrow.    ESRD On hemodialysis on Tuesday Thursday and Saturday.  [place].  Management per nephrology.  Diabetes mellitus type  II On Lantus insulin 5 units at bedtime.  Also on sliding scale coverage.  Monitor CBGs closely.    Essential hypertension On clonidine patch, nifedipine lisinopril, Imdur and metoprolol.   Anemia of chronic kidney disease Hemoglobin has been stable.  We will repeat labs tomorrow.  History of CAD  No chest pain.  Continue home medications including nitrates, beta-blocker, ACE inhibitor. Also on statin.  Previous history of stroke  Presently appears nonfocal. CT head unremarkable.  Not noted to be on any antiplatelet agent here.  Home medication list reviewed.  She was on aspirin which will be resumed.  DVT Prophylaxis: Subcutaneous heparin    Code Status: Full code Family Communication: Discussed with the patient. Disposition Plan: Management as outlined above.  Await TEE.    LOS: 1 day   Osvaldo Shipper  [facility] Pager [phone] 07/31/2017, 12:50 PM  If 7PM-7AM, please contact night-coverage at [email or web address], password [facility]

## 2017-07-31 NOTE — [place] FL2 (Addendum)
[place] MEDICAID FL2 LEVEL OF CARE SCREENING TOOL     IDENTIFICATION  Patient Name: Nicole Cox Birthdate: September 17, 1931 Sex: female Admission Date (Current Location): 07/27/2017  [facility] and [place] Number:  Producer, television/film/video and Address:  [facility] [place], [address] [place], [place] [address]      Provider Number: [id number]  Attending Physician Name and Address:  Osvaldo Shipper, MD  Relative Name and Phone Number:       Current Level of Care: Hospital Recommended Level of Care: Skilled Nursing Facility Prior Approval Number:    Date Approved/Denied:   PASRR Number: [id number]  Discharge Plan: SNF    Current Diagnoses: Patient Active Problem List   Diagnosis Date Noted  . SIRS (systemic inflammatory response syndrome) (HCC) 07/27/2017  . Malnutrition of moderate degree 06/10/2017  . Pressure injury of skin 06/08/2017  . Acute pulmonary edema (HCC) 06/07/2017  . Respiratory failure with hypoxia (HCC) 12/12/2015  . Acute on chronic respiratory failure with hypoxia (HCC) 12/12/2015  . Fluid overload 12/12/2015  . Palliative care encounter   . Acute respiratory failure with hypoxia (HCC) 08/13/2015  . ESRD on hemodialysis (HCC) 08/13/2015  . Hypertensive urgency 08/13/2015  . Chronic anemia 08/13/2015  . Acute respiratory failure (HCC) 08/13/2015  . HCAP (healthcare-associated pneumonia) 08/11/2012  . Hypoxia 08/11/2012  . ESRD (end stage renal disease) (HCC) 08/11/2012  . Hypertension 08/11/2012  . DM (diabetes mellitus), type 2 with renal complications (HCC) 08/11/2012  . C1 cervical fracture (HCC) 08/11/2012  . Abnormal MRI, thoracic spine 08/11/2012  . COPD (chronic obstructive pulmonary disease) (HCC) 08/11/2012  . CAD (coronary artery disease) 08/11/2012  . Fall 08/11/2012    Orientation RESPIRATION BLADDER Height & Weight     Self, Place  O2(Nasal Cannula) Continent Weight: 134 lb 7.7 oz (61 kg) Height:   (160 cm)   BEHAVIORAL SYMPTOMS/MOOD NEUROLOGICAL BOWEL NUTRITION STATUS      Continent Diet(NPO, subject to change)  AMBULATORY STATUS COMMUNICATION OF NEEDS Skin   Limited Assist Verbally Normal                       Personal Care Assistance Level of Assistance  Bathing, Feeding, Dressing Bathing Assistance: Limited assistance Feeding assistance: Independent Dressing Assistance: Limited assistance     Functional Limitations Info  Sight, Hearing, Speech Sight Info: Adequate Hearing Info: Adequate Speech Info: Adequate    SPECIAL CARE FACTORS FREQUENCY  OT (By licensed OT), PT (By licensed PT)     PT Frequency: 2x OT Frequency: 2x            Contractures Contractures Info: Not present    Additional Factors Info  Code Status, Allergies Code Status Info: Full COde Allergies Info: No known allergies           Current Medications (07/31/2017):  This is the current hospital active medication list Current Facility-Administered Medications  Medication Dose Route Frequency Provider Last Rate Last Dose  . acetaminophen (TYLENOL) tablet 650 mg  650 mg Oral Q6H PRN Eduard Clos, MD   650 mg at 07/30/17 0423   Or  . acetaminophen (TYLENOL) suppository 650 mg  650 mg Rectal Q6H PRN Eduard Clos, MD      . albuterol (PROVENTIL) (2.5 MG/3ML) 0.083% nebulizer solution 2.5 mg  2.5 mg Nebulization Q6H PRN Eduard Clos, MD      . Ampicillin-Sulbactam (UNASYN) 3 g in sodium chloride 0.9 % 100 mL IVPB  3 g Intravenous Q12H Judyann Munson, MD   Stopped at 07/31/17 1059  . aspirin EC tablet 81 mg  81 mg Oral Daily Osvaldo Shipper, MD      . calcium acetate (PHOSLO) capsule 1,334 mg  1,334 mg Oral TID WC Eduard Clos, MD   1,334 mg at 07/31/17 1420  . Chlorhexidine Gluconate Cloth 2 % PADS 6 each  6 each Topical Q0600 Eduard Clos, MD   6 each at 07/31/17 0700  . [START ON 08/04/2017] cloNIDine (CATAPRES - Dosed in mg/24 hr) patch 0.2 mg  0.2 mg  Transdermal Q7 days Annie Sable, MD      . doxercalciferol (HECTOROL) capsule 2.5 mcg  2.5 mcg Oral Q T,Th,Sa-HD Annie Sable, MD      . feeding supplement (NEPRO CARB STEADY) liquid 237 mL  237 mL Oral BID BM Eduard Clos, MD   237 mL at 07/31/17 1413  . fluticasone furoate-vilanterol (BREO ELLIPTA) 100-25 MCG/INH 1 puff  1 puff Inhalation Daily Eduard Clos, MD   1 puff at 07/31/17 1123  . heparin injection 5,000 Units  5,000 Units Subcutaneous Q8H Eduard Clos, MD   5,000 Units at 07/31/17 1411  . insulin aspart (novoLOG) injection 0-9 Units  0-9 Units Subcutaneous TID WC Eduard Clos, MD   1 Units at 07/30/17 1423  . insulin glargine (LANTUS) injection 5 Units  5 Units Subcutaneous QHS Eduard Clos, MD   5 Units at 07/30/17 2352  . isosorbide mononitrate (IMDUR) 24 hr tablet 30 mg  30 mg Oral QPM Eduard Clos, MD   30 mg at 07/30/17 1803  . lisinopril (PRINIVIL,ZESTRIL) tablet 40 mg  40 mg Oral QPM Eduard Clos, MD   40 mg at 07/30/17 1803  . metoprolol tartrate (LOPRESSOR) tablet 12.5 mg  12.5 mg Oral BID Eduard Clos, MD   12.5 mg at 07/30/17 1153  . mupirocin ointment (BACTROBAN) 2 % 1 application  1 application Nasal BID Eduard Clos, MD   1 application at 07/31/17 1414  . NIFEdipine (PROCARDIA XL/ADALAT-CC) 24 hr tablet 60 mg  60 mg Oral BID Eduard Clos, MD   60 mg at 07/30/17 1153  . nitroGLYCERIN (NITROSTAT) SL tablet 0.4 mg  0.4 mg Sublingual See admin instructions Eduard Clos, MD      . ondansetron [facility]) tablet 4 mg  4 mg Oral Q6H PRN Eduard Clos, MD       Or  . ondansetron [facility]) injection 4 mg  4 mg Intravenous Q6H PRN Eduard Clos, MD      . pravastatin (PRAVACHOL) tablet 40 mg  40 mg Oral Daily Eduard Clos, MD   40 mg at 07/30/17 1153  . tiotropium (SPIRIVA) inhalation capsule 18 mcg  1 capsule Inhalation Daily Eduard Clos, MD   18 mcg at  07/31/17 1123  . traMADol (ULTRAM) tablet 50 mg  50 mg Oral Q12H PRN Eduard Clos, MD      . traZODone (DESYREL) tablet 50-100 mg  50-100 mg Oral QHS PRN Eduard Clos, MD         Discharge Medications: Please see discharge summary for a list of discharge medications.  Relevant Imaging Results:  Relevant Lab Results:   Additional Information SSN: [id number]  Contact precautions: MRSA  Mordche Hedglin A Shalandra Leu, LCSW

## 2017-08-01 DIAGNOSIS — I10 Essential (primary) hypertension: Secondary | ICD-10-CM

## 2017-08-01 LAB — CBC
HCT: 34.8 % — ABNORMAL LOW (ref 36.0–46.0)
HCT: 34.9 % — ABNORMAL LOW (ref 36.0–46.0)
HEMOGLOBIN: 10.9 g/dL — AB (ref 12.0–15.0)
Hemoglobin: 10.8 g/dL — ABNORMAL LOW (ref 12.0–15.0)
MCH: 28.3 pg (ref 26.0–34.0)
MCH: 27.9 pg (ref 26.0–34.0)
MCHC: 30.9 g/dL (ref 30.0–36.0)
MCHC: 31.3 g/dL (ref 30.0–36.0)
MCV: 89 fL (ref 78.0–100.0)
MCV: 91.4 fL (ref 78.0–100.0)
PLATELETS: 236 10*3/uL (ref 150–400)
PLATELETS: 234 10*3/uL (ref 150–400)
RBC: 3.82 MIL/uL — ABNORMAL LOW (ref 3.87–5.11)
RBC: 3.91 MIL/uL (ref 3.87–5.11)
RDW: 18.9 % — ABNORMAL HIGH (ref 11.5–15.5)
RDW: 19 % — AB (ref 11.5–15.5)
WBC: 11.7 10*3/uL — ABNORMAL HIGH (ref 4.0–10.5)
WBC: 11.9 10*3/uL — ABNORMAL HIGH (ref 4.0–10.5)

## 2017-08-01 LAB — RENAL FUNCTION PANEL
Albumin: 2.3 g/dL — ABNORMAL LOW (ref 3.5–5.0)
Anion gap: 14 (ref 5–15)
BUN: 64 mg/dL — ABNORMAL HIGH (ref 6–20)
CALCIUM: 9 mg/dL (ref 8.9–10.3)
CO2: 26 mmol/L (ref 22–32)
CREATININE: 9.69 mg/dL — AB (ref 0.44–1.00)
Chloride: 97 mmol/L — ABNORMAL LOW (ref 101–111)
GFR calc Af Amer: 4 mL/min — ABNORMAL LOW (ref >60)
GFR calc non Af Amer: 3 mL/min — ABNORMAL LOW (ref >60)
GLUCOSE: 158 mg/dL — AB (ref 65–99)
Phosphorus: 3.7 mg/dL (ref 2.5–4.6)
Potassium: 4.9 mmol/L (ref 3.5–5.1)
SODIUM: 137 mmol/L (ref 135–145)

## 2017-08-01 LAB — GLUCOSE, CAPILLARY
GLUCOSE-CAPILLARY: 93 mg/dL (ref 65–99)
GLUCOSE-CAPILLARY: 118 mg/dL — AB (ref 65–99)
GLUCOSE-CAPILLARY: 158 mg/dL — AB (ref 65–99)
Glucose-Capillary: 188 mg/dL — ABNORMAL HIGH (ref 65–99)

## 2017-08-01 MED ORDER — HEPARIN SODIUM (PORCINE) 1000 UNIT/ML DIALYSIS: 20.0000 [IU]/kg | INTRAMUSCULAR | Status: DC | PRN

## 2017-08-01 NOTE — Progress Notes (Signed)
[facility] PROGRESS NOTE  Nicole Cox WGN:[id number] DOB: [birth year] DOA: 07/27/2017  PCP: Joana Reamer, MD  Brief History/Interval Summary: Nicole Cox a 84 y.o.femalewithhistory of ESRD on hemodialysis, hypertension, CAD, diabetes mellitus and previous stroke was brought to the ER after patient was found to be weak. As per the daughter patient has been getting weak last 2 days.  Following dialysis patient had progressive weakness was brought to the ED for further evaluation.  She was found to have a temperature of 102.0,  But the initial criteria for SIRS, borderline hypotensive. Subsequently admitted, cultures were obtained, initiated on spectrum antibiotics  Reason for Visit: Enterococcus bacteremia  Consultants: Infectious disease nephrology  Procedures: Hemodialysis  Antibiotics: Previously on vancomycin and Zosyn.  Currently on Unasyn.  Subjective/Interval History: Patient feels like she is wheezing.  Denies any shortness of breath otherwise.  No other complaints.  ROS: Denies any nausea or vomiting  Objective:  Vital Signs  Vitals:   07/31/17 1123 07/31/17 1620 07/31/17 2027 08/01/17 0632  BP:  (!) 179/61 (!) 170/55 (!) 171/55  Pulse: 77 89 84 78  Resp: Temp:  98.9 F (37.2 C) 98.9 F (37.2 C) 98.9 F (37.2 C)  TempSrc:  Oral Oral Oral  SpO2: 96% 95% 95% 100%  Weight:   62.1 kg (136 lb 14.5 oz)   Height:        Intake/Output Summary (Last 24 hours) at 08/01/2017 0921 Last data filed at 08/01/2017 0600 Gross per 24 hour  Intake 360 ml  Output 1 ml  Net 359 ml   Filed Weights   07/29/17 2014 07/30/17 2030 07/31/17 2027  Weight: 59 kg (130 lb 1.1 oz) 61 kg (134 lb 7.7 oz) 62.1 kg (136 lb 14.5 oz)    General appearance: Awake alert.  In no distress. Resp: Clear to auscultation bilaterally.  No wheezing rales or rhonchi. Cardio: S1-S2 is normal regular.  No S3-S4.  No rubs murmurs or bruit GI: Abdomen is soft.   Nontender nondistended.  Bowel sounds are present.  No masses organomegaly Extremities: Minimal edema in the legs Neurologic: No focal neurological deficits  Lab Results:  Data Reviewed: I have personally reviewed following labs and imaging studies  CBC: Recent Labs  Lab 07/27/17 1911 07/28/17 0203 07/29/17 0858 08/01/17 0454  WBC 12.0* 9.5 8.6 11.9*  NEUTROABS 10.2* 7.4  --   --   HGB 11.8* 10.8* 10.5* 10.8*  HCT 38.3 35.9* 34.3* 34.9*  MCV 91.4 92.3 91.0 91.4  PLT 277 247 231 236    Basic Metabolic Panel: Recent Labs  Lab 07/27/17 1911 07/28/17 0203 07/29/17 0858  NA 139 139 140  K 4.1 4.1 4.5  CL 101 100* 100*  CO2 GLUCOSE 146* 197* 145*  BUN 18 23* 44*  CREATININE 4.88* 5.38* 7.40*  CALCIUM 8.3* 8.0* 8.1*  PHOS  --   --  5.8*    GFR: Estimated Creatinine Clearance: 4.8 mL/min (A) (by C-G formula based on SCr of 7.4 mg/dL (H)).  Liver Function Tests: Recent Labs  Lab 07/27/17 1911 07/28/17 0203 07/29/17 0858  AST 47* 54*  --   ALT 39 43  --   ALKPHOS 124 113  --   BILITOT 0.9 0.7  --   PROT 7.5 6.9  --   ALBUMIN 2.9* 2.7* 2.4*    Coagulation Profile: Recent Labs  Lab 07/27/17 1911  INR 1.09    Cardiac Enzymes: Recent Labs  Lab  07/28/17 0203  CKTOTAL 129  TROPONINI 0.07*    CBG: Recent Labs  Lab 07/30/17 2033 07/31/17 1136 07/31/17 1709 07/31/17 2155 08/01/17 0746  GLUCAP 175* 101* 142* 164* 93     Recent Results (from the past 240 hour(s))  Culture, blood (Routine x 2)     Status: Abnormal   Collection Time: 07/27/17  7:13 PM  Result Value Ref Range Status   Specimen Description BLOOD RIGHT FOREARM  Final   Special Requests   Final    BOTTLES DRAWN AEROBIC AND ANAEROBIC Blood Culture adequate volume   Culture  Setup Time   Final    GRAM POSITIVE COCCI IN BOTH AEROBIC AND ANAEROBIC BOTTLES CRITICAL RESULT CALLED TO, READ BACK BY AND VERIFIED WITH: PHARMD E SINCLARE 07/28/17 1305 PM BY MORAC    Culture (A)   Final    ENTEROCOCCUS FAECALIS SUSCEPTIBILITIES PERFORMED ON PREVIOUS CULTURE WITHIN THE LAST 5 DAYS. ACINETOBACTER CALCOACETICUS/BAUMANNII COMPLEX STAPHYLOCOCCUS SPECIES (COAGULASE NEGATIVE) THE SIGNIFICANCE OF ISOLATING THIS ORGANISM FROM A SINGLE SET OF BLOOD CULTURES WHEN MULTIPLE SETS ARE DRAWN IS UNCERTAIN. PLEASE NOTIFY THE MICROBIOLOGY DEPARTMENT WITHIN ONE WEEK IF SPECIATION AND SENSITIVITIES ARE REQUIRED. CRITICAL RESULT CALLED TO, READ BACK BY AND VERIFIED WITH: E SINCLAIR,PHARMD AT 1450 07/30/17 BY L BENFIELD CONCERNING GROWTH ON CULTURE Performed at [facility], [address] [place], [place] [address]    Report Status 07/31/2017 FINAL  Final   Organism ID, Bacteria ACINETOBACTER CALCOACETICUS/BAUMANNII COMPLEX  Final      Susceptibility   Acinetobacter calcoaceticus/baumannii complex - MIC*    CEFTAZIDIME 4 SENSITIVE Sensitive     CEFTRIAXONE 16 INTERMEDIATE Intermediate     CIPROFLOXACIN <=0.25 SENSITIVE Sensitive     GENTAMICIN <=1 SENSITIVE Sensitive     IMIPENEM <=0.25 SENSITIVE Sensitive     PIP/TAZO <=4 SENSITIVE Sensitive     TRIMETH/SULFA <=20 SENSITIVE Sensitive     CEFEPIME 2 SENSITIVE Sensitive     AMPICILLIN/SULBACTAM <=2 SENSITIVE Sensitive     * ACINETOBACTER CALCOACETICUS/BAUMANNII COMPLEX  Blood Culture ID Panel (Reflexed)     Status: Abnormal   Collection Time: 07/27/17  7:13 PM  Result Value Ref Range Status   Enterococcus species DETECTED (A) NOT DETECTED Final    Comment: CRITICAL RESULT CALLED TO, READ BACK BY AND VERIFIED WITH: PHARMD E SINCLARE 07/28/17 AT 1305 BY CM    Vancomycin resistance NOT DETECTED NOT DETECTED Final   Listeria monocytogenes NOT DETECTED NOT DETECTED Final   Staphylococcus species NOT DETECTED NOT DETECTED Final   Staphylococcus aureus NOT DETECTED NOT DETECTED Final   Streptococcus species NOT DETECTED NOT DETECTED Final   Streptococcus agalactiae NOT DETECTED NOT DETECTED Final   Streptococcus pneumoniae NOT  DETECTED NOT DETECTED Final   Streptococcus pyogenes NOT DETECTED NOT DETECTED Final   Acinetobacter baumannii NOT DETECTED NOT DETECTED Final   Enterobacteriaceae species NOT DETECTED NOT DETECTED Final   Enterobacter cloacae complex NOT DETECTED NOT DETECTED Final   Escherichia coli NOT DETECTED NOT DETECTED Final   Klebsiella oxytoca NOT DETECTED NOT DETECTED Final   Klebsiella pneumoniae NOT DETECTED NOT DETECTED Final   Proteus species NOT DETECTED NOT DETECTED Final   Serratia marcescens NOT DETECTED NOT DETECTED Final   Haemophilus influenzae NOT DETECTED NOT DETECTED Final   Neisseria meningitidis NOT DETECTED NOT DETECTED Final   Pseudomonas aeruginosa NOT DETECTED NOT DETECTED Final   Candida albicans NOT DETECTED NOT DETECTED Final   Candida glabrata NOT DETECTED NOT DETECTED Final   Candida krusei  NOT DETECTED NOT DETECTED Final   Candida parapsilosis NOT DETECTED NOT DETECTED Final   Candida tropicalis NOT DETECTED NOT DETECTED Final    Comment: Performed at [facility], [address], [place], [place] [address]  Culture, blood (Routine x 2)     Status: Abnormal   Collection Time: 07/27/17  7:18 PM  Result Value Ref Range Status   Specimen Description BLOOD RIGHT HAND  Final   Special Requests   Final    BOTTLES DRAWN AEROBIC AND ANAEROBIC Blood Culture adequate volume   Culture  Setup Time   Final    GRAM POSITIVE COCCI IN CHAINS IN BOTH AEROBIC AND ANAEROBIC BOTTLES CRITICAL VALUE NOTED.  VALUE IS CONSISTENT WITH PREVIOUSLY REPORTED AND CALLED VALUE. Performed at [facility], [address] [place], [place] [address]    Culture ENTEROCOCCUS FAECALIS (A)  Final   Report Status 07/30/2017 FINAL  Final   Organism ID, Bacteria ENTEROCOCCUS FAECALIS  Final      Susceptibility   Enterococcus faecalis - MIC*    AMPICILLIN <=2 SENSITIVE Sensitive     VANCOMYCIN 2 SENSITIVE Sensitive     GENTAMICIN SYNERGY SENSITIVE Sensitive     * ENTEROCOCCUS FAECALIS    MRSA PCR Screening     Status: Abnormal   Collection Time: 07/28/17  1:28 AM  Result Value Ref Range Status   MRSA by PCR POSITIVE (A) NEGATIVE Final    Comment:        The GeneXpert MRSA Assay (FDA approved for NASAL specimens only), is one component of a comprehensive MRSA colonization surveillance program. It is not intended to diagnose MRSA infection nor to guide or monitor treatment for MRSA infections. RESULT CALLED TO, READ BACK BY AND VERIFIED WITH: Anson Oregon RN 07/28/17 0358 JDW Performed at [facility], [address] [place], [place] [address]   Culture, blood (Routine X 2) w Reflex to ID Panel     Status: None (Preliminary result)   Collection Time: 07/28/17  3:37 PM  Result Value Ref Range Status   Specimen Description BLOOD RIGHT ANTECUBITAL  Final   Special Requests   Final    BOTTLES DRAWN AEROBIC ONLY Blood Culture adequate volume   Culture   Final    NO GROWTH 3 DAYS Performed at [facility], [address], [place], [place] [address]    Report Status PENDING  Incomplete      Radiology Studies: No results found.   Medications:  Scheduled: . aspirin EC  81 mg Oral Daily  . calcium acetate  1,334 mg Oral TID WC  . Chlorhexidine Gluconate Cloth  6 each Topical Q0600  . [START ON 08/04/2017] cloNIDine  0.2 mg Transdermal Q7 days  . doxercalciferol  2.5 mcg Oral Q T,Th,Sa-HD  . feeding supplement (NEPRO CARB STEADY)  237 mL Oral BID BM  . fluticasone furoate-vilanterol  1 puff Inhalation Daily  . heparin  5,000 Units Subcutaneous Q8H  . insulin aspart  0-9 Units Subcutaneous TID WC  . insulin glargine  5 Units Subcutaneous QHS  . isosorbide mononitrate  30 mg Oral QPM  . lisinopril  40 mg Oral QPM  . metoprolol tartrate  12.5 mg Oral BID  . mupirocin ointment  1 application Nasal BID  . NIFEdipine  60 mg Oral BID  . nitroGLYCERIN  0.4 mg Sublingual See admin instructions  . pravastatin  40 mg Oral Daily  . tiotropium  1 capsule  Inhalation Daily  Continuous: . ampicillin-sulbactam (UNASYN) IV Stopped (07/31/17 1059)   WUJ:[id number] **OR** acetaminophen, albuterol, ondansetron **OR** ondansetron (ZOFRAN) IV, traMADol, traZODone  Assessment/Plan:    Bacteremia One set of blood culture positive for enterococcus.  Other set is positive for Acinetobacter.  Etiology remains unclear.  Patient was on vancomycin and Zosyn.  Changed over to Unasyn by infectious disease.  TEE has been recommended and to be done on Monday.  Remains afebrile.  WBC noted to be slightly high this morning.     ESRD On hemodialysis on Tuesday Thursday and Saturday at [facility].  Management per nephrology.  Diabetes mellitus type  II On Lantus insulin 5 units at bedtime.  One episode of hypoglycemia noted yesterday afternoon.  Continue to monitor CBGs.    Essential hypertension On clonidine patch, nifedipine lisinopril, Imdur and metoprolol.  Blood pressure noted to be elevated.  Will not make any further adjustments to medications at this time.  Anemia of chronic kidney disease Hemoglobin is stable.  History of CAD  Stable.  No chest pain.  Continue home medications including nitrates, beta-blocker, ACE inhibitor. Also on statin.  Previous history of stroke  Presently appears nonfocal. CT head unremarkable.  Continue aspirin.  DVT Prophylaxis: Subcutaneous heparin    Code Status: Full code Family Communication: Discussed with the patient. Disposition Plan: Waiting on TEE.  And then ID to determine antibiotic regiment in this patient on dialysis.    LOS: 2 days   Osvaldo Shipper  [facility] Pager [phone] 08/01/2017, 9:21 AM  If 7PM-7AM, please contact night-coverage at [email or web address], password [facility]

## 2017-08-01 NOTE — Progress Notes (Signed)
Patient arrived to unit per bed.  Reviewed treatment plan and this RN agrees.  Report received from bedside RN, Inetta Fermo.  Consent obtained.  Patient A & O X 4. Lung sounds diminished to ausculation in all fields. Generalized non pitting edema. Cardiac: NSR.  Prepped LUAVG with alcohol and cannulated with two 15 gauge needles.  Pulsation of blood noted.  Flushed access well with saline per protocol.  Connected and secured lines and initiated tx at 2300.  UF goal of 3500 mL and net fluid removal of 3000 mL.  Will continue to monitor.

## 2017-08-01 NOTE — Progress Notes (Signed)
Waymon Budge, RN on 35M called the pt's HD treatment was moved to 08/01/2017.

## 2017-08-01 NOTE — H&P (View-Only) (Signed)
Subjective:   no temp overnight-  Enterococcus faecalis and now acinetobacter - sens to most - she feels fine- ID now saying could get TTE and follow up blood cultures - HD got bumped to today due to high pt census  Objective Vital signs in last 24 hours: Vitals:   07/31/17 1620 07/31/17 2027 08/01/17 0632 08/01/17 0947  BP: (!) 179/61 (!) 170/55 (!) 171/55 (!) 182/57  Pulse: 89 84 78 78  Resp: 18 19 16 18  Temp: 98.9 F (37.2 C) 98.9 F (37.2 C) 98.9 F (37.2 C) 98.3 F (36.8 C)  TempSrc: Oral Oral Oral   SpO2: 95% 95% 100% 92%  Weight:  62.1 kg (136 lb 14.5 oz)    Height:       Weight change: 1.1 kg (2 lb 6.8 oz)  Intake/Output Summary (Last 24 hours) at 08/01/2017 1035 Last data filed at 08/01/2017 0900 Gross per 24 hour  Intake 600 ml  Output 1 ml  Net 599 ml    Dialyzes at [facility]-Dr. Zekan-3 hours EDW 129 pounds. HD Bath 2/2.5, Dialyzer 180, Heparin 2000 bolus, then with hourly rate . Access AVG. 15-gauge blood flow rate 400 on Mircera 130 mcg to q. weeks, last given on 5/9.  Hectorol 2.5 q. treatment   Assessment/Plan: 84-year-old black female with ESRD presented with weakness after dialysis-and found to have enterococcal bacteremia 1 Enterococcal bacteremia-has responded fairly quickly to antibiotic therapy, feels to be at her baseline.  ID is involved.  Unknown source of infection.  AV graft does not appear to be the source.  Outside possibility is that she was inoculated with the bacteria at the time of her hemodialysis treatment.  Would be great if can find antibiotic that she could get in HD- ID now more open to not do TEE, maybe TTE 2 ESRD: [facility] by Dr. Zekhan via AVG.  We will plan for her routine dialysis today- Sun instead of Sat- next on Tuesday  3 Hypertension: Blood pressure seems fine.-She is on a significant regimen of clonidine patch, lisinopril, Lopressor, nifedipine-volume status seems fine.  Unclear why she needs so many blood  pressure medications- have taken clonidine down to 0.2 - can challenge EDW with HD  4. Anemia of ESRD: Hemoglobin actually pretty good.  She received her Mircera on 5/9.  No plans for dosing further here 5. Bones-  We will continue with Hectorol 2.5 q. treatment.  She is on PhosLo and that has been continued     Nicole Cox A    Labs: Basic Metabolic Panel: Recent Labs  Lab 07/27/17 1911 07/28/17 0203 07/29/17 0858  NA 139 139 140  K 4.1 4.1 4.5  CL 101 100* 100*  CO2 25 25 24  GLUCOSE 146* 197* 145*  BUN 18 23* 44*  CREATININE 4.88* 5.38* 7.40*  CALCIUM 8.3* 8.0* 8.1*  PHOS  --   --  5.8*   Liver Function Tests: Recent Labs  Lab 07/27/17 1911 07/28/17 0203 07/29/17 0858  AST 47* 54*  --   ALT 39 43  --   ALKPHOS 124 113  --   BILITOT 0.9 0.7  --   PROT 7.5 6.9  --   ALBUMIN 2.9* 2.7* 2.4*   No results for input(s): LIPASE, AMYLASE in the last 168 hours. No results for input(s): AMMONIA in the last 168 hours. CBC: Recent Labs  Lab 07/27/17 1911 07/28/17 0203 07/29/17 0858 08/01/17 0454  WBC 12.0* 9.5 8.6 11.9*    NEUTROABS 10.2* 7.4  --   --   HGB 11.8* 10.8* 10.5* 10.8*  HCT 38.3 35.9* 34.3* 34.9*  MCV 91.4 92.3 91.0 91.4  PLT 277 247 231 236   Cardiac Enzymes: Recent Labs  Lab 07/28/17 0203  CKTOTAL 129  TROPONINI 0.07*   CBG: Recent Labs  Lab 07/30/17 2033 07/31/17 1136 07/31/17 1709 07/31/17 2155 08/01/17 0746  GLUCAP 175* 101* 142* 164* 93    Iron Studies: No results for input(s): IRON, TIBC, TRANSFERRIN, FERRITIN in the last 72 hours. Studies/Results: No results found. Medications: Infusions: . ampicillin-sulbactam (UNASYN) IV Stopped (07/31/17 1059)    Scheduled Medications: . aspirin EC  81 mg Oral Daily  . calcium acetate  1,334 mg Oral TID WC  . Chlorhexidine Gluconate Cloth  6 each Topical Q0600  . [START ON 08/04/2017] cloNIDine  0.2 mg Transdermal Q7 days  . doxercalciferol  2.5 mcg Oral Q T,Th,Sa-HD  . feeding  supplement (NEPRO CARB STEADY)  237 mL Oral BID BM  . fluticasone furoate-vilanterol  1 puff Inhalation Daily  . heparin  5,000 Units Subcutaneous Q8H  . insulin aspart  0-9 Units Subcutaneous TID WC  . insulin glargine  5 Units Subcutaneous QHS  . isosorbide mononitrate  30 mg Oral QPM  . lisinopril  40 mg Oral QPM  . metoprolol tartrate  12.5 mg Oral BID  . mupirocin ointment  1 application Nasal BID  . NIFEdipine  60 mg Oral BID  . nitroGLYCERIN  0.4 mg Sublingual See admin instructions  . pravastatin  40 mg Oral Daily  . tiotropium  1 capsule Inhalation Daily    have reviewed scheduled and prn medications.  Physical Exam: General: NAD Heart: RRR- HR 70-s Lungs: mostly clear Abdomen: soft,  Nontender Extremities: no edema Dialysis Access: left AVG- no issues    08/01/2017,10:35 AM  LOS: 2 days

## 2017-08-01 NOTE — Progress Notes (Signed)
Subjective:   no temp overnight-  Enterococcus faecalis and now acinetobacter - sens to most - she feels fine- ID now saying could get TTE and follow up blood cultures - HD got bumped to today due to high pt census  Objective Vital signs in last 24 hours: Vitals:   07/31/17 1620 07/31/17 2027 08/01/17 0632 08/01/17 0947  BP: (!) 179/61 (!) 170/55 (!) 171/55 (!) 182/57  Pulse: 89 84 78 78  Resp: Temp: 98.9 F (37.2 C) 98.9 F (37.2 C) 98.9 F (37.2 C) 98.3 F (36.8 C)  TempSrc: Oral Oral Oral   SpO2: 95% 95% 100% 92%  Weight:  62.1 kg (136 lb 14.5 oz)    Height:       Weight change: 1.1 kg (2 lb 6.8 oz)  Intake/Output Summary (Last 24 hours) at 08/01/2017 1035 Last data filed at 08/01/2017 0900 Gross per 24 hour  Intake 600 ml  Output 1 ml  Net 599 ml    Dialyzes at [facility]-Dr. Richard Miu hours EDW 129 pounds. HD Bath 2/2.5, Dialyzer 180, Heparin 2000 bolus, then with hourly rate . Access AVG. 15-gauge blood flow rate 400 on Mircera 130 mcg to q. weeks, last given on 5/9.  Hectorol 2.5 q. treatment   Assessment/Plan: 84 year old black female with ESRD presented with weakness after dialysis-and found to have enterococcal bacteremia 1 Enterococcal bacteremia-has responded fairly quickly to antibiotic therapy, feels to be at her baseline.  ID is involved.  Unknown source of infection.  AV graft does not appear to be the source.  Outside possibility is that she was inoculated with the bacteria at the time of her hemodialysis treatment.  Would be great if can find antibiotic that she could get in HD- ID now more open to not do TEE, maybe TTE 2 ESRD: [facility] followed by Dr. Jackalyn Lombard via AVG.  We will plan for her routine dialysis today- Sun instead of Sat- next on Tuesday  3 Hypertension: Blood pressure seems fine.-She is on a significant regimen of clonidine patch, lisinopril, Lopressor, nifedipine-volume status seems fine.  Unclear why she needs so many blood  pressure medications- have taken clonidine down to 0.2 - can challenge EDW with HD  4. Anemia of ESRD: Hemoglobin actually pretty good.  She received her Mircera on 5/9.  No plans for dosing further here 5. Bones-  We will continue with Hectorol 2.5 q. treatment.  She is on PhosLo and that has been continued     Jamonta Goerner A    Labs: Basic Metabolic Panel: Recent Labs  Lab 07/27/17 1911 07/28/17 0203 07/29/17 0858  NA 139 139 140  K 4.1 4.1 4.5  CL 101 100* 100*  CO2 GLUCOSE 146* 197* 145*  BUN 18 23* 44*  CREATININE 4.88* 5.38* 7.40*  CALCIUM 8.3* 8.0* 8.1*  PHOS  --   --  5.8*   Liver Function Tests: Recent Labs  Lab 07/27/17 1911 07/28/17 0203 07/29/17 0858  AST 47* 54*  --   ALT 39 43  --   ALKPHOS 124 113  --   BILITOT 0.9 0.7  --   PROT 7.5 6.9  --   ALBUMIN 2.9* 2.7* 2.4*   No results for input(s): LIPASE, AMYLASE in the last 168 hours. No results for input(s): AMMONIA in the last 168 hours. CBC: Recent Labs  Lab 07/27/17 1911 07/28/17 0203 07/29/17 0858 08/01/17 0454  WBC 12.0* 9.5 8.6 11.9*  NEUTROABS 10.2* 7.4  --   --   HGB 11.8* 10.8* 10.5* 10.8*  HCT 38.3 35.9* 34.3* 34.9*  MCV 91.4 92.3 91.0 91.4  PLT 277 247 231 236   Cardiac Enzymes: Recent Labs  Lab 07/28/17 0203  CKTOTAL 129  TROPONINI 0.07*   CBG: Recent Labs  Lab 07/30/17 2033 07/31/17 1136 07/31/17 1709 07/31/17 2155 08/01/17 0746  GLUCAP 175* 101* 142* 164* 93    Iron Studies: No results for input(s): IRON, TIBC, TRANSFERRIN, FERRITIN in the last 72 hours. Studies/Results: No results found. Medications: Infusions: . ampicillin-sulbactam (UNASYN) IV Stopped (07/31/17 1059)    Scheduled Medications: . aspirin EC  81 mg Oral Daily  . calcium acetate  1,334 mg Oral TID WC  . Chlorhexidine Gluconate Cloth  6 each Topical Q0600  . [START ON 08/04/2017] cloNIDine  0.2 mg Transdermal Q7 days  . doxercalciferol  2.5 mcg Oral Q T,Th,Sa-HD  . feeding  supplement (NEPRO CARB STEADY)  237 mL Oral BID BM  . fluticasone furoate-vilanterol  1 puff Inhalation Daily  . heparin  5,000 Units Subcutaneous Q8H  . insulin aspart  0-9 Units Subcutaneous TID WC  . insulin glargine  5 Units Subcutaneous QHS  . isosorbide mononitrate  30 mg Oral QPM  . lisinopril  40 mg Oral QPM  . metoprolol tartrate  12.5 mg Oral BID  . mupirocin ointment  1 application Nasal BID  . NIFEdipine  60 mg Oral BID  . nitroGLYCERIN  0.4 mg Sublingual See admin instructions  . pravastatin  40 mg Oral Daily  . tiotropium  1 capsule Inhalation Daily    have reviewed scheduled and prn medications.  Physical Exam: General: NAD Heart: RRR- HR 70-s Lungs: mostly clear Abdomen: soft,  Nontender Extremities: no edema Dialysis Access: left AVG- no issues    08/01/2017,10:35 AM  LOS: 2 days

## 2017-08-02 ENCOUNTER — Encounter (HOSPITAL_COMMUNITY)
Admission: EM | Disposition: A | Discharge status: Skilled Nursing Facility | Payer: Self-pay | Source: Home / Self Care | Attending: Internal Medicine

## 2017-08-02 ENCOUNTER — Inpatient Hospital Stay (HOSPITAL_COMMUNITY): Payer: Medicare HMO | Admitting: Certified Registered Nurse Anesthetist

## 2017-08-02 ENCOUNTER — Encounter (HOSPITAL_COMMUNITY): Payer: Self-pay | Admitting: *Deleted

## 2017-08-02 ENCOUNTER — Other Ambulatory Visit (HOSPITAL_COMMUNITY): Payer: Medicare HMO

## 2017-08-02 ENCOUNTER — Inpatient Hospital Stay (HOSPITAL_COMMUNITY): Payer: Medicare HMO

## 2017-08-02 DIAGNOSIS — R7881 Bacteremia: Secondary | ICD-10-CM

## 2017-08-02 DIAGNOSIS — I340 Nonrheumatic mitral (valve) insufficiency: Secondary | ICD-10-CM

## 2017-08-02 HISTORY — PX: TEE WITHOUT CARDIOVERSION: SHX5443

## 2017-08-02 LAB — CULTURE, BLOOD (ROUTINE X 2)
Culture: NO GROWTH
Special Requests: ADEQUATE

## 2017-08-02 LAB — GLUCOSE, CAPILLARY
GLUCOSE-CAPILLARY: 107 mg/dL — AB (ref 65–99)
GLUCOSE-CAPILLARY: 104 mg/dL — AB (ref 65–99)
GLUCOSE-CAPILLARY: 152 mg/dL — AB (ref 65–99)
Glucose-Capillary: 132 mg/dL — ABNORMAL HIGH (ref 65–99)
Glucose-Capillary: 149 mg/dL — ABNORMAL HIGH (ref 65–99)

## 2017-08-02 SURGERY — ECHOCARDIOGRAM, TRANSESOPHAGEAL
Anesthesia: Monitor Anesthesia Care

## 2017-08-02 MED ORDER — PROPOFOL 500 MG/50ML IV EMUL
INTRAVENOUS | Status: DC | PRN
  Administered 2017-08-02: 50 ug/kg/min via INTRAVENOUS

## 2017-08-02 MED ORDER — PROPOFOL 10 MG/ML IV BOLUS
INTRAVENOUS | Status: DC | PRN
  Administered 2017-08-02: 40 mg via INTRAVENOUS
  Administered 2017-08-02 (×2): 30 mg via INTRAVENOUS

## 2017-08-02 MED ORDER — LIDOCAINE 2% (20 MG/ML) 5 ML SYRINGE
INTRAMUSCULAR | Status: DC | PRN
  Administered 2017-08-02: 100 mg via INTRAVENOUS

## 2017-08-02 MED ORDER — SODIUM CHLORIDE 0.9 % IV SOLN
INTRAVENOUS | Status: DC
  Administered 2017-08-02: 09:00:00 via INTRAVENOUS

## 2017-08-02 NOTE — Progress Notes (Signed)
[facility] PROGRESS NOTE  Nicole Cox ZOX:[id number] DOB: 02/14/1932 DOA: 07/27/2017  PCP: Joana Reamer, MD  Brief History/Interval Summary: Nicole Cox a 84 y.o.femalewithhistory of ESRD on hemodialysis, hypertension, CAD, diabetes mellitus and previous stroke was brought to the ER after patient was found to be weak. As per the daughter patient has been getting weak last 2 days.  Following dialysis patient had progressive weakness was brought to the ED for further evaluation.  She was found to have a temperature of 102.0,  But the initial criteria for SIRS, borderline hypotensive. Subsequently admitted, cultures were obtained, initiated on spectrum antibiotics  Reason for Visit: Enterococcus bacteremia  Consultants: Infectious disease nephrology  Procedures: Hemodialysis   TEE - Left ventricle: Systolic function was normal. The estimated ejection fraction was in the range of 55% to 60%. Wall motion was normal; there were no regional wall motion abnormalities. - Aorta: There was moderate non-mobile atheroma. - Mitral valve: There was mild regurgitation. - Left atrium: No evidence of thrombus in the atrial cavity or appendage. No evidence of thrombus in the atrial cavity or appendage. No evidence of thrombus in the appendage. - Right ventricle: Systolic function was normal. - Right atrium: No evidence of thrombus in the atrial cavity or appendage. - Tricuspid valve: There was moderate regurgitation. - Pulmonary arteries: Systolic pressure was severely increased. PA peak pressure: 87 mm Hg (S). - Pericardium, extracardiac: There was no pericardial effusion. Impressions: - There was no evidence of a vegetation.    Antibiotics: Previously on vancomycin and Zosyn.  Currently on Unasyn.  Subjective/Interval History: Patient was dialyzed very late in the night last night.  During her dialysis her blood pressure dropped significantly.   Patient feels exhausted this morning.  However she denies any chest pain or shortness of breath.  ROS: No nausea or vomiting  Objective:  Vital Signs  Vitals:   08/02/17 0900 08/02/17 1000 08/02/17 1015 08/02/17 1040  BP: (!) 182/53 (!) 171/43 (!) 178/80 (!) 162/56  Pulse:    64  Resp: (!) 26 (!) 27 (!) 21 16  Temp: 99.3 F (37.4 C)   98.2 F (36.8 C)  TempSrc: Oral     SpO2: 99% 100% 100% 98%  Weight: 60.8 kg (134 lb)     Height:  (1.6 m)       Intake/Output Summary (Last 24 hours) at 08/02/2017 1215 Last data filed at 08/02/2017 1100 Gross per 24 hour  Intake 1020 ml  Output 2386 ml  Net -1366 ml   Filed Weights   08/01/17 2255 08/02/17 0200 08/02/17 0900  Weight: 64.3 kg (141 lb 12.1 oz) 61 kg (134 lb 7.7 oz) 60.8 kg (134 lb)    General appearance: alert, cooperative, appears stated age and no distress Resp: clear to auscultation bilaterally Cardio: regular rate and rhythm, S1, S2 normal, no murmur, click, rub or gallop GI: soft, non-tender; bowel sounds normal; no masses,  no organomegaly Extremities: extremities normal, atraumatic, no cyanosis or edema Neurologic: No focal neurological deficits.  Lab Results:  Data Reviewed: I have personally reviewed following labs and imaging studies  CBC: Recent Labs  Lab 07/27/17 1911 07/28/17 0203 07/29/17 0858 08/01/17 0454 08/01/17 2332  WBC 12.0* 9.5 8.6 11.9* 11.7*  NEUTROABS 10.2* 7.4  --   --   --   HGB 11.8* 10.8* 10.5* 10.8* 10.9*  HCT 38.3 35.9* 34.3* 34.9* 34.8*  MCV 91.4 92.3 91.0 91.4 89.0  PLT 277 247 231 236 234  Basic Metabolic Panel: Recent Labs  Lab 07/27/17 1911 07/28/17 0203 07/29/17 0858 08/01/17 2300  NA 139 139 140 137  K 4.1 4.1 4.5 4.9  CL 101 100* 100* 97*  CO2 GLUCOSE 146* 197* 145* 158*  BUN 18 23* 44* 64*  CREATININE 4.88* 5.38* 7.40* 9.69*  CALCIUM 8.3* 8.0* 8.1* 9.0  PHOS  --   --  5.8* 3.7    GFR: Estimated Creatinine Clearance: 3.6 mL/min (A)  (by C-G formula based on SCr of 9.69 mg/dL (H)).  Liver Function Tests: Recent Labs  Lab 07/27/17 1911 07/28/17 0203 07/29/17 0858 08/01/17 2300  AST 47* 54*  --   --   ALT 39 43  --   --   ALKPHOS 124 113  --   --   BILITOT 0.9 0.7  --   --   PROT 7.5 6.9  --   --   ALBUMIN 2.9* 2.7* 2.4* 2.3*    Coagulation Profile: Recent Labs  Lab 07/27/17 1911  INR 1.09    Cardiac Enzymes: Recent Labs  Lab 07/28/17 0203  CKTOTAL 129  TROPONINI 0.07*    CBG: Recent Labs  Lab 08/01/17 1645 08/01/17 2246 08/02/17 0242 08/02/17 0732 08/02/17 1136  GLUCAP 118* 158* 107* 104* 132*     Recent Results (from the past 240 hour(s))  Culture, blood (Routine x 2)     Status: Abnormal   Collection Time: 07/27/17  7:13 PM  Result Value Ref Range Status   Specimen Description BLOOD RIGHT FOREARM  Final   Special Requests   Final    BOTTLES DRAWN AEROBIC AND ANAEROBIC Blood Culture adequate volume   Culture  Setup Time   Final    GRAM POSITIVE COCCI IN BOTH AEROBIC AND ANAEROBIC BOTTLES CRITICAL RESULT CALLED TO, READ BACK BY AND VERIFIED WITH: PHARMD E SINCLARE 07/28/17 1305 PM BY MORAC    Culture (A)  Final    ENTEROCOCCUS FAECALIS SUSCEPTIBILITIES PERFORMED ON PREVIOUS CULTURE WITHIN THE LAST 5 DAYS. ACINETOBACTER CALCOACETICUS/BAUMANNII COMPLEX STAPHYLOCOCCUS SPECIES (COAGULASE NEGATIVE) THE SIGNIFICANCE OF ISOLATING THIS ORGANISM FROM A SINGLE SET OF BLOOD CULTURES WHEN MULTIPLE SETS ARE DRAWN IS UNCERTAIN. PLEASE NOTIFY THE MICROBIOLOGY DEPARTMENT WITHIN ONE WEEK IF SPECIATION AND SENSITIVITIES ARE REQUIRED. CRITICAL RESULT CALLED TO, READ BACK BY AND VERIFIED WITH: E SINCLAIR,PHARMD AT 1450 07/30/17 BY L BENFIELD CONCERNING GROWTH ON CULTURE Performed at [facility], [address], [place], [place] [address]    Report Status 07/31/2017 FINAL  Final   Organism ID, Bacteria ACINETOBACTER CALCOACETICUS/BAUMANNII COMPLEX  Final      Susceptibility   Acinetobacter  calcoaceticus/baumannii complex - MIC*    CEFTAZIDIME 4 SENSITIVE Sensitive     CEFTRIAXONE 16 INTERMEDIATE Intermediate     CIPROFLOXACIN <=0.25 SENSITIVE Sensitive     GENTAMICIN <=1 SENSITIVE Sensitive     IMIPENEM <=0.25 SENSITIVE Sensitive     PIP/TAZO <=4 SENSITIVE Sensitive     TRIMETH/SULFA <=20 SENSITIVE Sensitive     CEFEPIME 2 SENSITIVE Sensitive     AMPICILLIN/SULBACTAM <=2 SENSITIVE Sensitive     * ACINETOBACTER CALCOACETICUS/BAUMANNII COMPLEX  Blood Culture ID Panel (Reflexed)     Status: Abnormal   Collection Time: 07/27/17  7:13 PM  Result Value Ref Range Status   Enterococcus species DETECTED (A) NOT DETECTED Final    Comment: CRITICAL RESULT CALLED TO, READ BACK BY AND VERIFIED WITH: PHARMD E SINCLARE 07/28/17 AT 1305 BY CM    Vancomycin resistance NOT DETECTED  NOT DETECTED Final   Listeria monocytogenes NOT DETECTED NOT DETECTED Final   Staphylococcus species NOT DETECTED NOT DETECTED Final   Staphylococcus aureus NOT DETECTED NOT DETECTED Final   Streptococcus species NOT DETECTED NOT DETECTED Final   Streptococcus agalactiae NOT DETECTED NOT DETECTED Final   Streptococcus pneumoniae NOT DETECTED NOT DETECTED Final   Streptococcus pyogenes NOT DETECTED NOT DETECTED Final   Acinetobacter baumannii NOT DETECTED NOT DETECTED Final   Enterobacteriaceae species NOT DETECTED NOT DETECTED Final   Enterobacter cloacae complex NOT DETECTED NOT DETECTED Final   Escherichia coli NOT DETECTED NOT DETECTED Final   Klebsiella oxytoca NOT DETECTED NOT DETECTED Final   Klebsiella pneumoniae NOT DETECTED NOT DETECTED Final   Proteus species NOT DETECTED NOT DETECTED Final   Serratia marcescens NOT DETECTED NOT DETECTED Final   Haemophilus influenzae NOT DETECTED NOT DETECTED Final   Neisseria meningitidis NOT DETECTED NOT DETECTED Final   Pseudomonas aeruginosa NOT DETECTED NOT DETECTED Final   Candida albicans NOT DETECTED NOT DETECTED Final   Candida glabrata NOT DETECTED  NOT DETECTED Final   Candida krusei NOT DETECTED NOT DETECTED Final   Candida parapsilosis NOT DETECTED NOT DETECTED Final   Candida tropicalis NOT DETECTED NOT DETECTED Final    Comment: Performed at [facility], [address], [place], [place] [address]  Culture, blood (Routine x 2)     Status: Abnormal   Collection Time: 07/27/17  7:18 PM  Result Value Ref Range Status   Specimen Description BLOOD RIGHT HAND  Final   Special Requests   Final    BOTTLES DRAWN AEROBIC AND ANAEROBIC Blood Culture adequate volume   Culture  Setup Time   Final    GRAM POSITIVE COCCI IN CHAINS IN BOTH AEROBIC AND ANAEROBIC BOTTLES CRITICAL VALUE NOTED.  VALUE IS CONSISTENT WITH PREVIOUSLY REPORTED AND CALLED VALUE. Performed at [facility], [address], [place], [place] [address]    Culture ENTEROCOCCUS FAECALIS (A)  Final   Report Status 07/30/2017 FINAL  Final   Organism ID, Bacteria ENTEROCOCCUS FAECALIS  Final      Susceptibility   Enterococcus faecalis - MIC*    AMPICILLIN <=2 SENSITIVE Sensitive     VANCOMYCIN 2 SENSITIVE Sensitive     GENTAMICIN SYNERGY SENSITIVE Sensitive     * ENTEROCOCCUS FAECALIS  MRSA PCR Screening     Status: Abnormal   Collection Time: 07/28/17  1:28 AM  Result Value Ref Range Status   MRSA by PCR POSITIVE (A) NEGATIVE Final    Comment:        The GeneXpert MRSA Assay (FDA approved for NASAL specimens only), is one component of a comprehensive MRSA colonization surveillance program. It is not intended to diagnose MRSA infection nor to guide or monitor treatment for MRSA infections. RESULT CALLED TO, READ BACK BY AND VERIFIED WITH: Anson Oregon RN 07/28/17 0358 JDW Performed at [facility], [address] [place], [place] [address]   Culture, blood (Routine X 2) w Reflex to ID Panel     Status: None (Preliminary result)   Collection Time: 07/28/17  3:37 PM  Result Value Ref Range Status   Specimen Description BLOOD RIGHT ANTECUBITAL  Final     Special Requests   Final    BOTTLES DRAWN AEROBIC ONLY Blood Culture adequate volume   Culture   Final    NO GROWTH 4 DAYS Performed at [facility], [address], [place], [place] [address]  Report Status PENDING  Incomplete      Radiology Studies: No results found.   Medications:  Scheduled: . aspirin EC  81 mg Oral Daily  . calcium acetate  1,334 mg Oral TID WC  . [START ON 08/04/2017] cloNIDine  0.2 mg Transdermal Q7 days  . doxercalciferol  2.5 mcg Oral Q T,Th,Sa-HD  . feeding supplement (NEPRO CARB STEADY)  237 mL Oral BID BM  . fluticasone furoate-vilanterol  1 puff Inhalation Daily  . heparin  5,000 Units Subcutaneous Q8H  . insulin aspart  0-9 Units Subcutaneous TID WC  . insulin glargine  5 Units Subcutaneous QHS  . isosorbide mononitrate  30 mg Oral QPM  . lisinopril  40 mg Oral QPM  . metoprolol tartrate  12.5 mg Oral BID  . NIFEdipine  60 mg Oral BID  . nitroGLYCERIN  0.4 mg Sublingual See admin instructions  . pravastatin  40 mg Oral Daily  . tiotropium  1 capsule Inhalation Daily   Continuous: . ampicillin-sulbactam (UNASYN) IV 3 g (08/02/17 1057)   ZOX:[id number] **OR** acetaminophen, albuterol, ondansetron **OR** ondansetron (ZOFRAN) IV, traMADol, traZODone  Assessment/Plan:  Bacteremia One set of blood culture positive for enterococcus. ?Other set is positive for Acinetobacter. ?Etiology remains unclear. ?Patient was on vancomycin and Zosyn. ?Changed over to Unasyn by infectious disease. ?TEE has been done today and does not show any vegetation.  Management per infectious disease.  ??  ESRD On hemodialysis on Tuesday Thursday and Saturday?at?[facility]. ?Management per nephrology.  Diabetes mellitus type ?II On Lantus insulin 5 units at bedtime.?No further episodes of hypoglycemia.  Continue to monitor closely.    Essential hypertension Patient with very labile blood pressures.  Dropped down into the 90s with dialysis yesterday.  Now  improved.  Continue current medications.  On clonidine patch, nifedipine lisinopril, Imdur and metoprolol.  Anemia of chronic kidney disease Hemoglobin is stable.  History of CAD? Stable.?No chest pain. ?Continue home medications including nitrates, beta-blocker, ACE inhibitor. Also on statin.  Previous history of stroke  Presently appears nonfocal. CT head unremarkable.?Continue aspirin.  DVT Prophylaxis: Subcutaneous heparin    Code Status: Full code Family Communication: Discussed with the patient and her daughter Disposition Plan: Patient is not certain about short-term rehab at a skilled nursing facility.  She will discuss with family.  Await final recommendations from infectious disease.    LOS: 3 days   Osvaldo Shipper  [facility] Pager [phone] 08/02/2017, 12:15 PM  If 7PM-7AM, please contact night-coverage at [email or web address], password [facility]

## 2017-08-02 NOTE — Anesthesia Postprocedure Evaluation (Signed)
Anesthesia Post Note  Patient: Nicole Cox  Procedure(s) Performed: TRANSESOPHAGEAL ECHOCARDIOGRAM (TEE) (N/A )     Patient location during evaluation: PACU Anesthesia Type: MAC Level of consciousness: awake and alert Pain management: pain level controlled Vital Signs Assessment: post-procedure vital signs reviewed and stable Respiratory status: spontaneous breathing, nonlabored ventilation, respiratory function stable and patient connected to nasal cannula oxygen Cardiovascular status: stable and blood pressure returned to baseline Postop Assessment: no apparent nausea or vomiting Anesthetic complications: no    Last Vitals:  Vitals:   08/02/17 1015 08/02/17 1040  BP: (!) 178/80 (!) 162/56  Pulse:  64  Resp: (!) 21 16  Temp:  36.8 C  SpO2: 100% 98%    Last Pain:  Vitals:   08/02/17 1100  TempSrc:   PainSc: 0-No pain                 Romin Divita DAVID

## 2017-08-02 NOTE — Progress Notes (Signed)
Dialysis treatment terminated with 30 minutes remaining in scheduled treatment.  Terminated early d/t S/S of hypotension including non responsive episode.  UF discontinued, NS bolus of 200 mL administered and blood returned.  Nephrology notified.  3085 mL ultrafiltrated and net fluid removal 2385 mL.    Patient status unchanged. Lung sounds clear to ausculation in all fields. Generalized edema. Cardiac: NSR.  Disconnected lines and removed needles.  Pressure held for 10 minutes and band aid/gauze dressing applied.  Report given to bedside RN, Inetta Fermo.

## 2017-08-02 NOTE — Interval H&P Note (Signed)
History and Physical Interval Note:  08/02/2017 8:37 AM  Nicole Cox  has presented today for surgery, with the diagnosis of BACTEREMIA  The various methods of treatment have been discussed with the patient and family. After consideration of risks, benefits and other options for treatment, the patient has consented to  Procedure(s): TRANSESOPHAGEAL ECHOCARDIOGRAM (TEE) (N/A) as a surgical intervention .  The patient's history has been reviewed, patient examined, no change in status, stable for surgery.  I have reviewed the patient's chart and labs.  Questions were answered to the patient's satisfaction.     Tobias Alexander

## 2017-08-02 NOTE — Progress Notes (Signed)
[facility] Rounding Note  Dialyzes at [facility]-Dr. Richard Miu hours EDW 129 pounds. HD Bath 2/2.5, Dialyzer 180, Heparin 2000 bolus, then with hourly rate . Access AVG. 15-gauge blood flow rate 400 on Mircera 130 mcg to q. weeks, last given on 5/9.  Hectorol 2.5 q. treatment  Background 84 year old black female PMH HTN, CAD, DM, prior stroke, ESRD TTS with [facility]. Admitted w/ weakness after dialysis, febrile, borderline hypotensive, found to have 1/2 BC+ enterococcus, the other for Acinetobacter. Current ATB is Unasyn. TEE neg for vegetation.   Assessment/Recommendations 1. Enterococcal and Acinetobacter bacteremia - 1 BC + for each. ID is involved.  Unknown source of infection.  AV graft does not appear to be the source.  Outside possibility is that she was inoculated with the bacteria at the time of her hemodialysis treatment.  Would be great if can find antibiotic that she could get in HD. Currently on Unasyn (cannot get this in outpt HD) 2. ESRD: Normally [facility] followed by Dr. Leretha Dykes via AVG.  Finished HD early hours this AM terminated sl early 2/2 hypotensive episode. Next HD tomorrow.  3. Hypertension: On a significant regimen of clonidine patch, lisinopril, Lopressor, nifedipine.   Unclear why she needs so many blood pressure medications. Clonidine has been reduced. Sig hypotensive episode with HD yesterday, has rebounded back up now.  4. Anemia of ESRD: Last Mircera on 5/9 as outpt.  No plans for dosing further here 5. Secondary HPT - Hectorol 2.5 q. treatment.  She is on PhosLo and that has been continued   Subjective:    Had sig BP drop w/HD last PM into early this AM (d/t high pt census).  Exhausted 2/2 this. Next HD due tomorrow 5/21 ATB changed to Unasyn with 2 different organisms on [facility] - Enterococcus faecalis and now acinetobacter  TEE neg   Objective Vital signs in last 24 hours: Vitals:   08/02/17 0900 08/02/17 1000 08/02/17 1015 08/02/17 1040  BP: (!) 182/53 (!) 171/43  (!) 178/80 (!) 162/56  Pulse:    64  Resp: (!) 26 (!) 27 (!) 21 16  Temp: 99.3 F (37.4 C)   98.2 F (36.8 C)  TempSrc: Oral     SpO2: 99% 100% 100% 98%  Weight: 60.8 kg (134 lb)     Height:  (1.6 m)      Weight change: 2.2 kg (4 lb 13.6 oz)  Intake/Output Summary (Last 24 hours) at 08/02/2017 1512 Last data filed at 08/02/2017 1100 Gross per 24 hour  Intake 540 ml  Output 2386 ml  Net -1846 ml   Physical Exam: NAD RRR- HR 70-s Lungs clear Abd soft, not tender No LE edema L AVG + bruit   Recent Labs  Lab 07/28/17 0203 07/29/17 0858 08/01/17 2300  NA 139 140 137  K 4.1 4.5 4.9  CL 100* 100* 97*  CO2 GLUCOSE 197* 145* 158*  BUN 23* 44* 64*  CREATININE 5.38* 7.40* 9.69*  CALCIUM 8.0* 8.1* 9.0  PHOS  --  5.8* 3.7    Recent Labs  Lab 07/27/17 1911 07/28/17 0203 07/29/17 0858 08/01/17 2300  AST 47* 54*  --   --   ALT 39 43  --   --   ALKPHOS 124 113  --   --   BILITOT 0.9 0.7  --   --   PROT 7.5 6.9  --   --   ALBUMIN 2.9* 2.7* 2.4* 2.3*    Recent  Labs  Lab 07/27/17 1911 07/28/17 0203 07/29/17 0858 08/01/17 0454 08/01/17 2332  WBC 12.0* 9.5 8.6 11.9* 11.7*  NEUTROABS 10.2* 7.4  --   --   --   HGB 11.8* 10.8* 10.5* 10.8* 10.9*  HCT 38.3 35.9* 34.3* 34.9* 34.8*  MCV 91.4 92.3 91.0 91.4 89.0  PLT 277 247 231 236 234    Recent Labs  Lab 07/28/17 0203  CKTOTAL 129  TROPONINI 0.07*    Recent Labs  Lab 08/01/17 1645 08/01/17 2246 08/02/17 0242 08/02/17 0732 08/02/17 1136  GLUCAP 118* 158* 107* 104* 132*    Medications: Infusions: . ampicillin-sulbactam (UNASYN) IV Stopped (08/02/17 1218)    Scheduled Medications: . aspirin EC  81 mg Oral Daily  . calcium acetate  1,334 mg Oral TID WC  . [START ON 08/04/2017] cloNIDine  0.2 mg Transdermal Q7 days  . doxercalciferol  2.5 mcg Oral Q T,Th,Sa-HD  . feeding supplement (NEPRO CARB STEADY)  237 mL Oral BID BM  . fluticasone furoate-vilanterol  1 puff Inhalation Daily  .  heparin  5,000 Units Subcutaneous Q8H  . insulin aspart  0-9 Units Subcutaneous TID WC  . insulin glargine  5 Units Subcutaneous QHS  . isosorbide mononitrate  30 mg Oral QPM  . lisinopril  40 mg Oral QPM  . metoprolol tartrate  12.5 mg Oral BID  . NIFEdipine  60 mg Oral BID  . nitroGLYCERIN  0.4 mg Sublingual See admin instructions  . pravastatin  40 mg Oral Daily  . tiotropium  1 capsule Inhalation Daily   Camille Bal, MD [facility] [phone] Pager 08/02/2017, 3:16 PM

## 2017-08-02 NOTE — Transfer of Care (Signed)
Immediate Anesthesia Transfer of Care Note  Patient: Nicole Cox  Procedure(s) Performed: TRANSESOPHAGEAL ECHOCARDIOGRAM (TEE) (N/A )  Patient Location: Endoscopy Unit  Anesthesia Type:MAC  Level of Consciousness: drowsy  Airway & Oxygen Therapy: Patient Spontanous Breathing and Patient connected to nasal cannula oxygen  Post-op Assessment: Report given to RN and Post -op Vital signs reviewed and stable  Post vital signs: Reviewed and stable  Last Vitals:  Vitals Value Taken Time  BP    Temp    Pulse    Resp    SpO2      Last Pain:  Vitals:   08/02/17 0900  TempSrc: Oral  PainSc: 0-No pain         Complications: No apparent anesthesia complications

## 2017-08-02 NOTE — Progress Notes (Addendum)
[facility]    Date of Admission:  07/27/2017   Total days of antibiotics 7           Day 4 amp/sub   ID: Nicole Cox is a 84 y.o. female with SIRS found to have polymicrobial bacteremia Principal Problem:   SIRS (systemic inflammatory response syndrome) (HCC) Active Problems:   Hypertension   DM (diabetes mellitus), type 2 with renal complications (HCC)   CAD (coronary artery disease)   ESRD on hemodialysis (HCC)   Chronic anemia   Bacteremia    Subjective: Afebrile. Had TEE this morning which ruled out endocarditis  Medications:  . aspirin EC  81 mg Oral Daily  . calcium acetate  1,334 mg Oral TID WC  . [START ON 08/04/2017] cloNIDine  0.2 mg Transdermal Q7 days  . doxercalciferol  2.5 mcg Oral Q T,Th,Sa-HD  . feeding supplement (NEPRO CARB STEADY)  237 mL Oral BID BM  . fluticasone furoate-vilanterol  1 puff Inhalation Daily  . heparin  5,000 Units Subcutaneous Q8H  . insulin aspart  0-9 Units Subcutaneous TID WC  . insulin glargine  5 Units Subcutaneous QHS  . isosorbide mononitrate  30 mg Oral QPM  . lisinopril  40 mg Oral QPM  . metoprolol tartrate  12.5 mg Oral BID  . NIFEdipine  60 mg Oral BID  . nitroGLYCERIN  0.4 mg Sublingual See admin instructions  . pravastatin  40 mg Oral Daily  . tiotropium  1 capsule Inhalation Daily    Objective: Vital signs in last 24 hours: Temp:  [98 F (36.7 C)-99.3 F (37.4 C)] 98.2 F (36.8 C) (05/20 1659) Pulse Rate:  [59-94] 94 (05/20 1659) Resp:  [16-27] 18 (05/20 1637) BP: (90-210)/(42-89) 114/87 (05/20 1659) SpO2:  [94 %-100 %] 100 % (05/20 1659) Weight:  [134 lb (60.8 kg)-141 lb 12.1 oz (64.3 kg)] 134 lb (60.8 kg) (05/20 0900) Physical Exam  Constitutional:  oriented to person, place. appears stated age and well-nourished. No distress.  HENT: [place]/AT, PERRLA, no scleral icterus Mouth/Throat: Oropharynx is clear and moist. No oropharyngeal exudate.  Cardiovascular: Normal rate, regular  rhythm and normal heart sounds. Exam reveals no gallop and no friction rub.  No murmur heard.  Pulmonary/Chest: Effort normal and breath sounds normal. No respiratory distress.  has no wheezes.  Neurological: alert and oriented to person, place Skin: Skin is warm and dry. No rash noted. No erythema.  Psychiatric: a normal mood and affect.  behavior is normal.   Lab Results Recent Labs    08/01/17 0454 08/01/17 2300 08/01/17 2332  WBC 11.9*  --  11.7*  HGB 10.8*  --  10.9*  HCT 34.9*  --  34.8*  NA  --  137  --   K  --  4.9  --   CL  --  97*  --   CO2  --  26  --   BUN  --  64*  --   CREATININE  --  9.69*  --    Liver Panel Recent Labs    08/01/17 2300  ALBUMIN 2.3*    Microbiology: 5/15 blood cx ngtd 5/14 blood cx x 1 enterococcus 5/14 blood cx x 1 enterococcus, acinetobacter,staph species Studies/Results: No results found.   Assessment/Plan: Enterococcal bacteremia = plan to treat for 14 days. She is currently on day 6 of treatment. Continue on amp/sub while hospitalized.Can finish the remaining days with vancomycin post HD until may 28. Endocarditis ruled out.  Acinetobacter= possible contaminant given that it was also isolated with staph species and enterococcus in 1 set. She has been receiving treatment. Continue on amp/sub while hospitalized and Can finish with ceftaz post HD -until may 28  Will sign off.  [facility] Cell: [phone] Pager: [phone]  08/02/2017, 5:20 PM

## 2017-08-02 NOTE — Progress Notes (Signed)
  Echocardiogram Echocardiogram Transesophageal has been performed.  Nicole Cox 08/02/2017, 10:07 AM

## 2017-08-02 NOTE — Anesthesia Preprocedure Evaluation (Signed)
Anesthesia Evaluation  Patient identified by MRN, date of birth, ID band Patient awake    Reviewed: Allergy & Precautions, NPO status , Patient's Chart, lab work & pertinent test results  Airway Mallampati: II  TM Distance: >3 FB Neck ROM: Full    Dental   Pulmonary COPD, former smoker,    Pulmonary exam normal        Cardiovascular hypertension, Pt. on medications + CAD and +CHF  Normal cardiovascular exam     Neuro/Psych CVA    GI/Hepatic   Endo/Other  diabetes, Type 2, Oral Hypoglycemic Agents  Renal/GU Dialysis and ESRFRenal disease     Musculoskeletal   Abdominal   Peds  Hematology   Anesthesia Other Findings   Reproductive/Obstetrics                             Anesthesia Physical Anesthesia Plan  ASA: III  Anesthesia Plan: MAC   Post-op Pain Management:    Induction: Intravenous  PONV Risk Score and Plan: 2  Airway Management Planned: Simple Face Mask  Additional Equipment:   Intra-op Plan:   Post-operative Plan:   Informed Consent: I have reviewed the patients History and Physical, chart, labs and discussed the procedure including the risks, benefits and alternatives for the proposed anesthesia with the patient or authorized representative who has indicated his/her understanding and acceptance.     Plan Discussed with: CRNA and Surgeon  Anesthesia Plan Comments:         Anesthesia Quick Evaluation

## 2017-08-02 NOTE — CV Procedure (Addendum)
     Transesophageal Echocardiogram Note  Claudine Stallings [id number] January 13, 1932  Procedure: Transesophageal Echocardiogram Indications: Bacteremia  Procedure Details Consent: Obtained Time Out: Verified patient identification, verified procedure, site/side was marked, verified correct patient position, special equipment/implants available, Radiology Safety Procedures followed,  medications/allergies/relevent history reviewed, required imaging and test results available.  Performed  Medications: During this procedure the patient is administered a total of propofol 160 mg and lidocaine 100 mg administred by anesthesia staff to achieve and maintain moderate conscious sedation.  The patient's heart rate, blood pressure, and oxygen saturation are monitored continuously during the procedure. The period of conscious sedation is 30 minutes, of which I was present face-to-face 100% of this time.  - Left ventricle: Systolic function was normal. The estimated   ejection fraction was in the range of 55% to 60%. Wall motion was   normal; there were no regional wall motion abnormalities. - Aorta: There was moderate non-mobile atheroma. - Mitral valve: There was mild regurgitation. - Left atrium: No evidence of thrombus in the atrial cavity or   appendage. No evidence of thrombus in the atrial cavity or   appendage. No evidence of thrombus in the appendage. - Right ventricle: Systolic function was normal. - Right atrium: No evidence of thrombus in the atrial cavity or   appendage. - Tricuspid valve: There was moderate regurgitation. - Pulmonary arteries: Systolic pressure was severely increased. PA   peak pressure: 87 mm Hg (S). - Pericardium, extracardiac: There was no pericardial effusion.  Impressions:  - There was no evidence of a vegetation.  Complications: No apparent complications Patient did tolerate procedure well.  Tobias Alexander, MD, [facility] 08/02/2017, 8:39 AM

## 2017-08-02 NOTE — Progress Notes (Addendum)
Pharmacy Antibiotic Note  Nicole Cox is a 84 y.o. female on Unasyn for enterococcal  and acinetobacter bacteremia. Pharmacy consulted to change to vancomycin (last dose was 5/16 at 11:30am) and fortaz. Plan is to treat for a total of 14 days (last day will be 5/28).  Spoke with Dr. Drue Second: plan is to change to vancomycin and fortaz at discharge. Continue Unasyn as inpatient.   Plan: -Continue Unasyn for hospital stay at 3gm IV q12h -Change to vancomycin and fortaz at discharge   Height:  (160 cm) Weight: 134 lb (60.8 kg) IBW/kg (Calculated) : 52.4  Temp (24hrs), Avg:98.6 F (37 C), Min:98 F (36.7 C), Max:99.3 F (37.4 C)  Recent Labs  Lab 07/27/17 1911 07/27/17 1924 07/28/17 0203 07/29/17 0858 08/01/17 0454 08/01/17 2300 08/01/17 2332  WBC 12.0*  --  9.5 8.6 11.9*  --  11.7*  CREATININE 4.88*  --  5.38* 7.40*  --  9.69*  --   LATICACIDVEN  --  1.72 1.8  --   --   --   --     Estimated Creatinine Clearance: 3.6 mL/min (A) (by C-G formula based on SCr of 9.69 mg/dL (H)).    No Known Allergies  Antimicrobials this admission: Unasyn 5/17>> Vanc 5/14 >>5/17 (Doses: 1g (5/14 @ 2145) 500 mg (5/16 @ 1130) Zosyn 5/15 >>5/15  Dose adjustments this admission:  Microbiology results: 5/14 BCx >> 2/2 E faecalis (pan-S) 5/14 Bcx >> Acinetobacter (I-CTX, S-all else including unasyn) 5/15 MRSA PCR >> positive 5/15 BCx x 1: neg  Thank you for allowing pharmacy to be a part of this patient's care.  Harland German, PharmD Clinical Pharmacist 08/02/2017 6:12 PM

## 2017-08-03 LAB — GLUCOSE, CAPILLARY
GLUCOSE-CAPILLARY: 79 mg/dL (ref 65–99)
GLUCOSE-CAPILLARY: 147 mg/dL — AB (ref 65–99)
Glucose-Capillary: 151 mg/dL — ABNORMAL HIGH (ref 65–99)
Glucose-Capillary: 155 mg/dL — ABNORMAL HIGH (ref 65–99)

## 2017-08-03 LAB — RENAL FUNCTION PANEL
Albumin: 2.2 g/dL — ABNORMAL LOW (ref 3.5–5.0)
Anion gap: 12 (ref 5–15)
BUN: 42 mg/dL — AB (ref 6–20)
CALCIUM: 8.6 mg/dL — AB (ref 8.9–10.3)
CO2: 29 mmol/L (ref 22–32)
CREATININE: 7.09 mg/dL — AB (ref 0.44–1.00)
Chloride: 97 mmol/L — ABNORMAL LOW (ref 101–111)
GFR, EST AFRICAN AMERICAN: 5 mL/min — AB (ref >60)
GFR, EST NON AFRICAN AMERICAN: 5 mL/min — AB (ref >60)
Glucose, Bld: 124 mg/dL — ABNORMAL HIGH (ref 65–99)
Phosphorus: 4 mg/dL (ref 2.5–4.6)
Potassium: 4.3 mmol/L (ref 3.5–5.1)
SODIUM: 138 mmol/L (ref 135–145)

## 2017-08-03 LAB — CBC
HCT: 31.2 % — ABNORMAL LOW (ref 36.0–46.0)
Hemoglobin: 9.6 g/dL — ABNORMAL LOW (ref 12.0–15.0)
MCH: 27.9 pg (ref 26.0–34.0)
MCHC: 30.8 g/dL (ref 30.0–36.0)
MCV: 90.7 fL (ref 78.0–100.0)
Platelets: 223 10*3/uL (ref 150–400)
RBC: 3.44 MIL/uL — AB (ref 3.87–5.11)
RDW: 18.9 % — AB (ref 11.5–15.5)
WBC: 9.7 10*3/uL (ref 4.0–10.5)

## 2017-08-03 LAB — VANCOMYCIN, RANDOM: Vancomycin Rm: 10

## 2017-08-03 MED ORDER — TRAZODONE HCL 100 MG PO TABS: 50.0000 mg | ORAL_TABLET | Freq: Every evening | ORAL | 0 refills | Status: DC | PRN

## 2017-08-03 MED ORDER — CLONIDINE 0.2 MG/24HR TD PTWK: 0.2000 mg | MEDICATED_PATCH | TRANSDERMAL | 2 refills | Status: DC

## 2017-08-03 MED ORDER — TRAMADOL HCL 50 MG PO TABS: 50.0000 mg | ORAL_TABLET | Freq: Three times a day (TID) | ORAL | 0 refills | Status: DC | PRN

## 2017-08-03 MED ORDER — DEXTROSE 5 % IV SOLN: INTRAVENOUS | Status: DC

## 2017-08-03 MED ORDER — VANCOMYCIN HCL 500 MG IV SOLR: INTRAVENOUS | Status: DC

## 2017-08-03 MED ORDER — DOXERCALCIFEROL 4 MCG/2ML IV SOLN
INTRAVENOUS | Status: AC
  Administered 2017-08-03: 2 ug
  Filled 2017-08-03: qty 2

## 2017-08-03 MED ORDER — DOXERCALCIFEROL 2.5 MCG PO CAPS: 2.5000 ug | ORAL_CAPSULE | ORAL | 0 refills | Status: AC

## 2017-08-03 NOTE — Clinical Social Work Note (Signed)
CSW secured a bed for patient at [facility] in [facility] and they will transport patient to [facility] on [facility], as they also take one other patient on Tuesdays per admissions staff person Vikki Ports [phone]). Insurance authorization with Arlys John and MD advised that authorization not received today. Vikki Ports at [facility] contacted and updated and indicated that patient can d/c to their facility once authorization received. Patient's nurse contacted MD and provided update. CSW will continue to follow and facilitate discharge d/c to facility once authorization received.  Genelle Bal, MSW, LCSW Licensed Clinical Social Worker Clinical Social Work Department [employer] [phone]

## 2017-08-03 NOTE — Progress Notes (Signed)
Paged Dr. Rito Ehrlich to advise pt will not be leaving today due to insurance authorization.  Dr. Rito Ehrlich returned page.  He stated he will leave discharge in, but she will not be leaving tonight.

## 2017-08-03 NOTE — Progress Notes (Signed)
Physical Therapy Treatment Patient Details Name: Nicole Cox MRN: [id number] DOB: 05-29-1931 Today's Date: 08/03/2017    History of Present Illness Nicole Cox is a 84 y.o. female with a history of SIRS, ESRD, HTN, COPD, CAD, HTN, hx CVA who presented to the ED for sudden onset of dyspnea the morning of admission. She reported waking up this morning with severe constant shortness of breath and orthopnea. She initially had wheezing, improved with breathing treatment at home but symptoms persisted. She required CPAP by EMS and was found to be in respiratory distress improved with supplemental oxygen.     PT Comments    Continuing work on functional mobility and activity tolerance;  Focused on the act of walking, second person present to push chair behind to boost pt's confidence and allow for increased distance. Noting overall good progress; also, initially upon standing pt tends to flex at the hips, with a "bouncing" motion of her trunk; this unstable motion tends to clear up when Ms. Mill is given a clear goal (such as "walk to the door")  Follow Up Recommendations  SNF;Supervision/Assistance - 24 hour     Equipment Recommendations  Rolling walker with 5" wheels;3in1 (PT)    Recommendations for Other Services       Precautions / Restrictions Precautions Precautions: Fall    Mobility  Bed Mobility Overal bed mobility: Needs Assistance Bed Mobility: Supine to Sit     Supine to sit: Supervision;Min assist     General bed mobility comments: Good initiation to get up; somewhat inefficient, and opted to provide assist to save energy for walking  Transfers Overall transfer level: Needs assistance Equipment used: Rolling walker (2 wheeled) Transfers: Sit to/from Stand Sit to Stand: Min guard         General transfer comment: Min guard for sit to stands from bed to RW. Increased time to achieve upright positioning. Prefers to push up with both hands on RW while PT  stabilized   Ambulation/Gait Ambulation/Gait assistance: Min guard;+2 safety/equipment Ambulation Distance (Feet): 35 Feet Assistive device: Rolling walker (2 wheeled) Gait Pattern/deviations: Step-through pattern;Decreased step length - right;Decreased step length - left;Decreased stride length;Trunk flexed Gait velocity: slowed   General Gait Details: Cues and encouragement to keep walking; tactile cueing for posture and minguard for safety   Stairs             Wheelchair Mobility    Modified Rankin (Stroke Patients Only)       Balance     Sitting balance-Leahy Scale: Fair       Standing balance-Leahy Scale: Poor Standing balance comment: reliant on UE support                            Cognition Arousal/Alertness: Awake/alert Behavior During Therapy: WFL for tasks assessed/performed Overall Cognitive Status: Within Functional Limits for tasks assessed                                        Exercises      General Comments        Pertinent Vitals/Pain Pain Assessment: No/denies pain    Home Living                      Prior Function            PT Goals (current goals can now  be found in the care plan section) Acute Rehab PT Goals Patient Stated Goal: To return home PT Goal Formulation: With patient Time For Goal Achievement: 08/11/17 Potential to Achieve Goals: Fair Progress towards PT goals: Progressing toward goals    Frequency    Min 2X/week      PT Plan Current plan remains appropriate    Co-evaluation              AM-PAC PT "6 Clicks" Daily Activity  Outcome Measure  Difficulty turning over in bed (including adjusting bedclothes, sheets and blankets)?: None Difficulty moving from lying on back to sitting on the side of the bed? : A Little Difficulty sitting down on and standing up from a chair with arms (e.g., wheelchair, bedside commode, etc,.)?: A Little Help needed moving to and  from a bed to chair (including a wheelchair)?: A Little Help needed walking in hospital room?: A Lot Help needed climbing 3-5 steps with a railing? : A Lot 6 Click Score: 17    End of Session Equipment Utilized During Treatment: Gait belt Activity Tolerance: Patient tolerated treatment well Patient left: in chair;with call bell/phone within reach Nurse Communication: Mobility status PT Visit Diagnosis: Unsteadiness on feet (R26.81);Other abnormalities of gait and mobility (R26.89);History of falling (Z91.81)     Time: 1610-9604 PT Time Calculation (min) (ACUTE ONLY): 17 min  Charges:  $Gait Training: 8-22 mins                    G Codes:       Van Clines, PT  Acute Rehabilitation Services Pager [phone] Office [phone]    Levi Aland 08/03/2017, 12:18 PM

## 2017-08-03 NOTE — Discharge Summary (Addendum)
[facility]  Physician Discharge Summary   Patient ID: Nicole Cox MRN: [id number] DOB/AGE: 09-29-1931 84 y.o.  Admit date: 07/27/2017 Discharge date: 08/03/2017  PCP: Joana Reamer, MD  DISCHARGE DIAGNOSES:  Bacteremia  RECOMMENDATIONS FOR OUTPATIENT FOLLOW UP: 1. Patient to get vancomycin and ceftazidime with dialysis through May 28 2. Continue outpatient hemodialysis on Tuesday Thursday Saturday schedule.   DISCHARGE CONDITION: fair  Diet recommendation: Modified carbohydrate  Filed Weights   08/02/17 0200 08/02/17 0900 08/02/17 1959  Weight: 61 kg (134 lb 7.7 oz) 60.8 kg (134 lb) 62 kg (136 lb 11 oz)    INITIAL HISTORY: Nicole Wilsonis a 84 y.o.femalewithhistory of ESRD on hemodialysis, hypertension, CAD, diabetes mellitus and previous stroke was brought to the ER after patient was found to be weak. As per the daughter patient has been getting weak last 2 days. Following dialysis patient had progressive weakness was brought to the ED for further evaluation. She was found to have a temperature of 102.0,  But the initial criteria for SIRS, borderline hypotensive. Subsequently admitted, cultures were obtained, initiated on spectrum antibiotics  Reason for Visit:Enterococcus bacteremia  Consultants:Infectious disease nephrology  Procedures:  Hemodialysis  TEE - Left ventricle: Systolic function was normal. The estimated ejection fraction was in the range of 55% to 60%. Wall motion was normal; there were no regional wall motion abnormalities. - Aorta: There was moderate non-mobile atheroma. - Mitral valve: There was mild regurgitation. - Left atrium: No evidence of thrombus in the atrial cavity or appendage. No evidence of thrombus in the atrial cavity or appendage. No evidence of thrombus in the appendage. - Right ventricle: Systolic function was normal. - Right atrium: No evidence of thrombus in the atrial cavity  or appendage. - Tricuspid valve: There was moderate regurgitation. - Pulmonary arteries: Systolic pressure was severely increased. PA peak pressure: 87 mm Hg (S). - Pericardium, extracardiac: There was no pericardial effusion. Impressions: - There was no evidence of a vegetation.   HOSPITAL COURSE:   Bacteremia One set of blood culture positive for enterococcus. ?Other set is positive for Acinetobacter. ?Etiology remains unclear. ?Patient was initially placed on vancomycin and Zosyn. ?Changed over to Unasyn by infectious disease. ?TEE has been done and does not show any vegetation.    Infectious disease recommends vancomycin and ceftazidime with dialysis through May 28. ??  ESRD On hemodialysis on Tuesday Thursday and Saturday?at?[facility]. ?Management per nephrology.  Diabetes mellitus type ?II On Lantus insulin 5 units at bedtime.?No further episodes of hypoglycemia.    Continue to monitor CBGs.  Essential hypertension Patient with very labile blood pressures.  Dropped down into the 90s with dialysis.  Dose of clonidine was reduced.  Continue with other medications as outlined below.  Anemia of chronic kidney disease Hemoglobin is stable.  History of CAD? Stable.?No chest pain. ?Continue home medications.  Previous history of stroke  Presently appears nonfocal. CT head unremarkable.?Continue aspirin.  Overall stable.  Okay for discharge to skilled nursing facility after dialysis today.    PERTINENT LABS:  The results of significant diagnostics from this hospitalization (including imaging, microbiology, ancillary and laboratory) are listed below for reference.    Microbiology: Recent Results (from the past 240 hour(s))  Culture, blood (Routine x 2)     Status: Abnormal   Collection Time: 07/27/17  7:13 PM  Result Value Ref Range Status   Specimen Description BLOOD RIGHT FOREARM  Final   Special Requests   Final    BOTTLES DRAWN AEROBIC AND ANAEROBIC  Blood  Culture adequate volume   Culture  Setup Time   Final    GRAM POSITIVE COCCI IN BOTH AEROBIC AND ANAEROBIC BOTTLES CRITICAL RESULT CALLED TO, READ BACK BY AND VERIFIED WITH: PHARMD E SINCLARE 07/28/17 1305 PM BY MORAC    Culture (A)  Final    ENTEROCOCCUS FAECALIS SUSCEPTIBILITIES PERFORMED ON PREVIOUS CULTURE WITHIN THE LAST 5 DAYS. ACINETOBACTER CALCOACETICUS/BAUMANNII COMPLEX STAPHYLOCOCCUS SPECIES (COAGULASE NEGATIVE) THE SIGNIFICANCE OF ISOLATING THIS ORGANISM FROM A SINGLE SET OF BLOOD CULTURES WHEN MULTIPLE SETS ARE DRAWN IS UNCERTAIN. PLEASE NOTIFY THE MICROBIOLOGY DEPARTMENT WITHIN ONE WEEK IF SPECIATION AND SENSITIVITIES ARE REQUIRED. CRITICAL RESULT CALLED TO, READ BACK BY AND VERIFIED WITH: E SINCLAIR,PHARMD AT 1450 07/30/17 BY L BENFIELD CONCERNING GROWTH ON CULTURE Performed at [facility], [address] [place], [place] [address]    Report Status 07/31/2017 FINAL  Final   Organism ID, Bacteria ACINETOBACTER CALCOACETICUS/BAUMANNII COMPLEX  Final      Susceptibility   Acinetobacter calcoaceticus/baumannii complex - MIC*    CEFTAZIDIME 4 SENSITIVE Sensitive     CEFTRIAXONE 16 INTERMEDIATE Intermediate     CIPROFLOXACIN <=0.25 SENSITIVE Sensitive     GENTAMICIN <=1 SENSITIVE Sensitive     IMIPENEM <=0.25 SENSITIVE Sensitive     PIP/TAZO <=4 SENSITIVE Sensitive     TRIMETH/SULFA <=20 SENSITIVE Sensitive     CEFEPIME 2 SENSITIVE Sensitive     AMPICILLIN/SULBACTAM <=2 SENSITIVE Sensitive     * ACINETOBACTER CALCOACETICUS/BAUMANNII COMPLEX  Blood Culture ID Panel (Reflexed)     Status: Abnormal   Collection Time: 07/27/17  7:13 PM  Result Value Ref Range Status   Enterococcus species DETECTED (A) NOT DETECTED Final    Comment: CRITICAL RESULT CALLED TO, READ BACK BY AND VERIFIED WITH: PHARMD E SINCLARE 07/28/17 AT 1305 BY CM    Vancomycin resistance NOT DETECTED NOT DETECTED Final   Listeria monocytogenes NOT DETECTED NOT DETECTED Final   Staphylococcus species NOT  DETECTED NOT DETECTED Final   Staphylococcus aureus NOT DETECTED NOT DETECTED Final   Streptococcus species NOT DETECTED NOT DETECTED Final   Streptococcus agalactiae NOT DETECTED NOT DETECTED Final   Streptococcus pneumoniae NOT DETECTED NOT DETECTED Final   Streptococcus pyogenes NOT DETECTED NOT DETECTED Final   Acinetobacter baumannii NOT DETECTED NOT DETECTED Final   Enterobacteriaceae species NOT DETECTED NOT DETECTED Final   Enterobacter cloacae complex NOT DETECTED NOT DETECTED Final   Escherichia coli NOT DETECTED NOT DETECTED Final   Klebsiella oxytoca NOT DETECTED NOT DETECTED Final   Klebsiella pneumoniae NOT DETECTED NOT DETECTED Final   Proteus species NOT DETECTED NOT DETECTED Final   Serratia marcescens NOT DETECTED NOT DETECTED Final   Haemophilus influenzae NOT DETECTED NOT DETECTED Final   Neisseria meningitidis NOT DETECTED NOT DETECTED Final   Pseudomonas aeruginosa NOT DETECTED NOT DETECTED Final   Candida albicans NOT DETECTED NOT DETECTED Final   Candida glabrata NOT DETECTED NOT DETECTED Final   Candida krusei NOT DETECTED NOT DETECTED Final   Candida parapsilosis NOT DETECTED NOT DETECTED Final   Candida tropicalis NOT DETECTED NOT DETECTED Final    Comment: Performed at [facility], [address], [place], [place] [address]  Culture, blood (Routine x 2)     Status: Abnormal   Collection Time: 07/27/17  7:18 PM  Result Value Ref Range Status   Specimen Description BLOOD RIGHT HAND  Final   Special Requests   Final    BOTTLES DRAWN AEROBIC AND ANAEROBIC Blood Culture adequate volume  Culture  Setup Time   Final    GRAM POSITIVE COCCI IN CHAINS IN BOTH AEROBIC AND ANAEROBIC BOTTLES CRITICAL VALUE NOTED.  VALUE IS CONSISTENT WITH PREVIOUSLY REPORTED AND CALLED VALUE. Performed at [facility], [address] [place], [place] [address]    Culture ENTEROCOCCUS FAECALIS (A)  Final   Report Status 07/30/2017 FINAL  Final   Organism ID, Bacteria  ENTEROCOCCUS FAECALIS  Final      Susceptibility   Enterococcus faecalis - MIC*    AMPICILLIN <=2 SENSITIVE Sensitive     VANCOMYCIN 2 SENSITIVE Sensitive     GENTAMICIN SYNERGY SENSITIVE Sensitive     * ENTEROCOCCUS FAECALIS  MRSA PCR Screening     Status: Abnormal   Collection Time: 07/28/17  1:28 AM  Result Value Ref Range Status   MRSA by PCR POSITIVE (A) NEGATIVE Final    Comment:        The GeneXpert MRSA Assay (FDA approved for NASAL specimens only), is one component of a comprehensive MRSA colonization surveillance program. It is not intended to diagnose MRSA infection nor to guide or monitor treatment for MRSA infections. RESULT CALLED TO, READ BACK BY AND VERIFIED WITH: Anson Oregon RN 07/28/17 0358 JDW Performed at [facility], [address] [place], [place] [address]   Culture, blood (Routine X 2) w Reflex to ID Panel     Status: None   Collection Time: 07/28/17  3:37 PM  Result Value Ref Range Status   Specimen Description BLOOD RIGHT ANTECUBITAL  Final   Special Requests   Final    BOTTLES DRAWN AEROBIC ONLY Blood Culture adequate volume   Culture   Final    NO GROWTH 5 DAYS Performed at [facility], [address], [place], [place] [address]    Report Status 08/02/2017 FINAL  Final     Labs: Basic Metabolic Panel: Recent Labs  Lab 07/27/17 1911 07/28/17 0203 07/29/17 0858 08/01/17 2300  NA 139 139 140 137  K 4.1 4.1 4.5 4.9  CL 101 100* 100* 97*  CO2 25 25 24 26   GLUCOSE 146* 197* 145* 158*  BUN 18 23* 44* 64*  CREATININE 4.88* 5.38* 7.40* 9.69*  CALCIUM 8.3* 8.0* 8.1* 9.0  PHOS  --   --  5.8* 3.7   Liver Function Tests: Recent Labs  Lab 07/27/17 1911 07/28/17 0203 07/29/17 0858 08/01/17 2300  AST 47* 54*  --   --   ALT 39 43  --   --   ALKPHOS 124 113  --   --   BILITOT 0.9 0.7  --   --   PROT 7.5 6.9  --   --   ALBUMIN 2.9* 2.7* 2.4* 2.3*   CBC: Recent Labs  Lab 07/27/17 1911 07/28/17 0203 07/29/17 0858  08/01/17 0454 08/01/17 2332  WBC 12.0* 9.5 8.6 11.9* 11.7*  NEUTROABS 10.2* 7.4  --   --   --   HGB 11.8* 10.8* 10.5* 10.8* 10.9*  HCT 38.3 35.9* 34.3* 34.9* 34.8*  MCV 91.4 92.3 91.0 91.4 89.0  PLT 277 247 231 236 234   Cardiac Enzymes: Recent Labs  Lab 07/28/17 0203  CKTOTAL 129  TROPONINI 0.07*    CBG: Recent Labs  Lab 08/02/17 1136 08/02/17 1642 08/02/17 2206 08/03/17 0755 08/03/17 1145  GLUCAP 132* 152* 149* 151* 155*     IMAGING STUDIES Dg Chest 2 View  Result Date: 07/27/2017 CLINICAL DATA:  Acute onset of generalized weakness and  fever. EXAM: CHEST - 2 VIEW COMPARISON:  Chest radiograph performed 06/07/2017 FINDINGS: The lungs are well-aerated. Vascular congestion is noted. Increased interstitial markings raise concern for pulmonary edema. Small bilateral pleural effusions are suspected. Chronic parenchymal changes are again noted at the right lung base. No pneumothorax is seen. The heart is borderline enlarged. No acute osseous abnormalities are seen. IMPRESSION: Vascular congestion and borderline cardiomegaly. Increased interstitial markings raise concern for pulmonary edema. Small bilateral pleural effusions suspected. Electronically Signed   By: Roanna Raider M.D.   On: 07/27/2017 21:11   Ct Head Wo Contrast  Result Date: 07/27/2017 CLINICAL DATA:  Weakness after leaving dialysis today. History of stroke, hypertension and diabetes. EXAM: CT HEAD WITHOUT CONTRAST TECHNIQUE: Contiguous axial images were obtained from the base of the skull through the vertex without intravenous contrast. COMPARISON:  CT HEAD Jul 30, 2015 FINDINGS: BRAIN: No intraparenchymal hemorrhage, mass effect nor midline shift. The ventricles and sulci are normal for age. Patchy supratentorial white matter hypodensities less than expected for patient's age, though non-specific are most compatible with chronic small vessel ischemic disease. No acute large vascular territory infarcts. No abnormal  extra-axial fluid collections. Basal cisterns are patent. VASCULAR: Moderate to severe calcific atherosclerosis of the carotid siphons. SKULL: No skull fracture. No significant scalp soft tissue swelling. Partially empty sella. SINUSES/ORBITS: The mastoid air-cells and included paranasal sinuses are well-aerated.The included ocular globes and orbital contents are non-suspicious. Status post bilateral ocular lens implants. OTHER: None. IMPRESSION: Negative noncontrast CT HEAD for age. Electronically Signed   By: Awilda Metro M.D.   On: 07/27/2017 22:53    DISCHARGE EXAMINATION: Vitals:   08/02/17 1659 08/02/17 1959 08/03/17 0640 08/03/17 0757  BP: 114/87 (!) 164/96 (!) 121/55 (!) 125/46  Pulse: 94 91 78 73  Resp:  16  18  Temp: 98.2 F (36.8 C) 99.1 F (37.3 C) (!) 97.3 F (36.3 C) 98.3 F (36.8 C)  TempSrc: Oral Oral Oral   SpO2: 100% 95% 96% 98%  Weight:  62 kg (136 lb 11 oz)    Height:       General appearance: alert, cooperative, appears stated age and no distress Resp: clear to auscultation bilaterally Cardio: regular rate and rhythm, S1, S2 normal, no murmur, click, rub or gallop GI: soft, non-tender; bowel sounds normal; no masses,  no organomegaly  DISPOSITION: SNF  Discharge Instructions    Call MD for:  difficulty breathing, headache or visual disturbances   Complete by:  As directed    Call MD for:  extreme fatigue   Complete by:  As directed    Call MD for:  persistant dizziness or light-headedness   Complete by:  As directed    Call MD for:  persistant nausea and vomiting   Complete by:  As directed    Call MD for:  severe uncontrolled pain   Complete by:  As directed    Call MD for:  temperature >100.4   Complete by:  As directed    Discharge instructions   Complete by:  As directed    Please review instructions on the discharge summary.  You were cared for by a hospitalist during your hospital stay. If you have any questions about your discharge  medications or the care you received while you were in the hospital after you are discharged, you can call the unit and asked to speak with the hospitalist on call if the hospitalist that took care of you is not available. Once you are discharged,  your primary care physician will handle any further medical issues. Please note that NO REFILLS for any discharge medications will be authorized once you are discharged, as it is imperative that you return to your primary care physician (or establish a relationship with a primary care physician if you do not have one) for your aftercare needs so that they can reassess your need for medications and monitor your lab values. If you do not have a primary care physician, you can call [phone] for a physician referral.   Increase activity slowly   Complete by:  As directed         Allergies as of 08/03/2017   No Known Allergies     Medication List    STOP taking these medications   cloNIDine 0.3 mg/24hr patch Commonly known as:  CATAPRES - Dosed in mg/24 hr Replaced by:  cloNIDine 0.2 mg/24hr patch     TAKE these medications   albuterol 108 (90 Base) MCG/ACT inhaler Commonly known as:  PROVENTIL HFA;VENTOLIN HFA Inhale 2 puffs into the lungs every 6 (six) hours as needed for wheezing or shortness of breath.   aspirin EC 81 MG tablet Take 81 mg by mouth daily.   calcium acetate 667 MG capsule Commonly known as:  PHOSLO Take 2 capsules (1,334 mg total) by mouth 3 (three) times daily with meals. What changed:    how much to take  when to take this   cefTAZidime in dextrose 5 % 50 mL With hemodialysis until Aug 10, 2017   cloNIDine 0.2 mg/24hr patch Commonly known as:  CATAPRES - Dosed in mg/24 hr Place 1 patch (0.2 mg total) onto the skin every 7 (seven) days. Start taking on:  08/04/2017 Replaces:  cloNIDine 0.3 mg/24hr patch   diphenhydrAMINE 25 mg capsule Commonly known as:  BENADRYL Take 25 mg by mouth every morning.    doxercalciferol 2.5 MCG capsule Commonly known as:  HECTOROL Take 1 capsule (2.5 mcg total) by mouth Every Tuesday,Thursday,and Saturday with dialysis.   feeding supplement (NEPRO CARB STEADY) Liqd Take 237 mLs by mouth 2 (two) times daily between meals.   fluticasone furoate-vilanterol 100-25 MCG/INH Aepb Commonly known as:  BREO ELLIPTA Inhale 1 puff into the lungs daily.   insulin glargine 100 UNIT/ML injection Commonly known as:  LANTUS Inject 0.05 mLs (5 Units total) into the skin at bedtime. What changed:  how much to take   isosorbide mononitrate 30 MG 24 hr tablet Commonly known as:  IMDUR Take 30 mg by mouth every evening.   lidocaine-prilocaine cream Commonly known as:  EMLA Apply 1 application topically every other day.   lisinopril 40 MG tablet Commonly known as:  PRINIVIL,ZESTRIL Take 40 mg by mouth every evening.   metoprolol tartrate 25 MG tablet Commonly known as:  LOPRESSOR Take 0.5 tablets (12.5 mg total) by mouth 2 (two) times daily. What changed:  when to take this   NIFEdipine 60 MG 24 hr tablet Commonly known as:  PROCARDIA XL/ADALAT-CC Take 60 mg by mouth 2 (two) times daily.   nitroGLYCERIN 0.4 MG SL tablet Commonly known as:  NITROSTAT Place 0.4 mg under the tongue See admin instructions. Mat repeat every 5 minutes. If third tab needed call 911   pravastatin 40 MG tablet Commonly known as:  PRAVACHOL Take 40 mg by mouth daily.   SPIRIVA HANDIHALER 18 MCG inhalation capsule Generic drug:  tiotropium Place 1 capsule into inhaler and inhale daily.   traMADol 50 MG tablet Commonly known as:  ULTRAM Take 1 tablet (50 mg total) by mouth every 8 (eight) hours as needed. What changed:  reasons to take this   traZODone 100 MG tablet Commonly known as:  DESYREL Take 0.5-1 tablets (50-100 mg total) by mouth at bedtime as needed for sleep.   vancomycin in sodium chloride 0.9 % 100 mL With hemodialysis until Aug 10, 2017        Follow-up  Information    Joana Reamer, MD. Schedule an appointment as soon as possible for a visit in 1 week(s).   Specialty:  Obstetrics and Gynecology Contact information: [address] [place] [place] [address] [phone]           TOTAL DISCHARGE TIME: 35 mins  Osvaldo Shipper  [facility] Pager [phone]  08/03/2017, 12:35 PM

## 2017-08-03 NOTE — Evaluation (Signed)
Occupational Therapy Evaluation Patient Details Name: Nicole Cox MRN: [id number] DOB: 28-Apr-1931 Today's Date: 08/03/2017    History of Present Illness Nicole Cox is a 84 y.o. female with a history of SIRS, ESRD, HTN, COPD, CAD, HTN, hx CVA who presented to the ED for sudden onset of dyspnea the morning of admission. She reported waking up this morning with severe constant shortness of breath and orthopnea. She initially had wheezing, improved with breathing treatment at home but symptoms persisted. She required CPAP by EMS and was found to be in respiratory distress improved with supplemental oxygen.    Clinical Impression   Pt admitted with the above diagnosis. Pt currently with functional limitations due to the deficits listed below (see OT Problem List).  Pt will benefit from skilled OT to increase their safety and independence with ADL and functional mobility for ADL to facilitate discharge to venue listed below.      Follow Up Recommendations  SNF - pt would benefit from post acute rehab to increase I prior to returning home   Equipment Recommendations  None recommended by OT    Recommendations for Other Services       Precautions / Restrictions Precautions Precautions: Fall      Mobility Bed Mobility Overal bed mobility: Needs Assistance Bed Mobility: Supine to Sit     Supine to sit: Min assist     General bed mobility comments: Good initiation to get up; somewhat inefficient, and opted to provide assist to save energy for walking  Transfers Overall transfer level: Needs assistance Equipment used: Rolling walker (2 wheeled) Transfers: Sit to/from Stand Sit to Stand: Min assist         General transfer comment: VC for hand placement    Balance Overall balance assessment: Needs assistance Sitting-balance support: Feet supported;Single extremity supported Sitting balance-Leahy Scale: Good     Standing balance support: Bilateral upper extremity  supported Standing balance-Leahy Scale: Poor Standing balance comment: reliant on UE support                           ADL either performed or assessed with clinical judgement   ADL Overall ADL's : Needs assistance/impaired Eating/Feeding: Set up;Sitting Eating/Feeding Details (indicate cue type and reason): able to open crackers  Grooming: Standing;Minimal assistance   Upper Body Bathing: Minimal assistance;Sitting   Lower Body Bathing: Maximal assistance;Sit to/from stand   Upper Body Dressing : Moderate assistance   Lower Body Dressing: Maximal assistance;Sit to/from stand                 General ADL Comments: eval ended at pt needed to go to dialysis     Vision Patient Visual Report: No change from baseline       Perception     Praxis      Pertinent Vitals/Pain Pain Assessment: No/denies pain     Hand Dominance     Extremity/Trunk Assessment Upper Extremity Assessment Upper Extremity Assessment: Generalized weakness           Communication Communication Communication: No difficulties   Cognition Arousal/Alertness: Awake/alert Behavior During Therapy: WFL for tasks assessed/performed Overall Cognitive Status: Within Functional Limits for tasks assessed                                                Home Living Family/patient  expects to be discharged to:: Private residence Living Arrangements: Children;Spouse/significant other(2 daughters and son) Available Help at Discharge: Available 24 hours/day Type of Home: House Home Access: Stairs to enter [employer] of Steps: 3 Entrance Stairs-Rails: Right;Left;Can reach both Home Layout: Multi-level;Able to live on main level with bedroom/bathroom     Bathroom Shower/Tub: Chief Strategy Officer: Standard     Home Equipment: Environmental consultant - 4 wheels;Other (comment)(2 L O2 all the time. Does not use O2 when leaving home)          Prior  Functioning/Environment Level of Independence: Independent with assistive device(s);Needs assistance  Gait / Transfers Assistance Needed: Pt reports ambulating to bus stop and taking bus to HD appointments ADL's / Homemaking Assistance Needed: Pt needs assistance with meal preparation   Comments: 1 fall in past year when tripping on cord        OT Problem List: Decreased strength;Decreased activity tolerance;Impaired balance (sitting and/or standing);Decreased knowledge of use of DME or AE      OT Treatment/Interventions: Self-care/ADL training;Patient/family education;DME and/or AE instruction;Therapeutic exercise;Therapeutic activities;Energy conservation    OT Goals(Current goals can be found in the care plan section) Acute Rehab OT Goals Patient Stated Goal: To return home OT Goal Formulation: With patient Time For Goal Achievement: 08/10/17  OT Frequency: Min 2X/week    AM-PAC PT "6 Clicks" Daily Activity     Outcome Measure Help from another person eating meals?: None Help from another person taking care of personal grooming?: A Little Help from another person toileting, which includes using toliet, bedpan, or urinal?: A Lot Help from another person bathing (including washing, rinsing, drying)?: A Lot Help from another person to put on and taking off regular upper body clothing?: A Little Help from another person to put on and taking off regular lower body clothing?: A Lot 6 Click Score: 16   End of Session Equipment Utilized During Treatment: Rolling walker Nurse Communication: Mobility status  Activity Tolerance: Patient limited by fatigue;Other (comment)(pt needed to leave for dialysis) Patient left: in bed;with call bell/phone within reach  OT Visit Diagnosis: Unsteadiness on feet (R26.81);Muscle weakness (generalized) (M62.81);Other abnormalities of gait and mobility (R26.89);History of falling (Z91.81)                Time: 1235-1250 OT Time Calculation (min): 15  min Charges:  OT General Charges $OT Visit: 1 Visit OT Evaluation $OT Eval Moderate Complexity: 1 Mod G-Codes:     Lise Auer, [place] [phone]  Einar Crow D 08/03/2017, 1:00 PM

## 2017-08-03 NOTE — Procedures (Signed)
I have personally attended this patient's dialysis session.  Using L AVG no issues Weights seem off - pre standing weight about 65 kg but clear lungs and no edema Will UF goal 1.5 liters Labs are pending - 2K bath at this time Says "thinks may have to go to rehab"  Camille Bal, MD [facility] [phone] Pager 08/03/2017, 1:37 PM

## 2017-08-03 NOTE — Progress Notes (Signed)
Pt gone to dialysis via bed on 2 lpm via [place].

## 2017-08-03 NOTE — Progress Notes (Signed)
Pharmacy Antibiotic Note  Nicole Cox is a 84 y.o. female on Unasyn for enterococcal  and acinetobacter bacteremia. Pharmacy consulted to change to vancomycin and Fortaz when patient is discharged so antibiotics can be given at outpatient HD center. Plan is to treat for a total of 14 days (last day will be 5/28). To continue Unasyn while inpatient.  Vancomycin random level today is 70mcg/mL. Patient's EDW from renal notes is 59kg.  Plan: -Continue Unasyn for hospital stay at 3g IV q12h -Plan for vancomycin  IV qHD + ceftazidime 2g IV qHD at discharge- will continue to follow HD schedule/tolerance and weights and will adjust this recommendation if clinical picture warrants   Height:  (160 cm) Weight: 136 lb 11 oz (62 kg) IBW/kg (Calculated) : 52.4  Temp (24hrs), Avg:98.3 F (36.8 C), Min:97.3 F (36.3 C), Max:99.3 F (37.4 C)  Recent Labs  Lab 07/27/17 1911 07/27/17 1924 07/28/17 0203 07/29/17 0858 08/01/17 0454 08/01/17 2300 08/01/17 2332 08/03/17 0542  WBC 12.0*  --  9.5 8.6 11.9*  --  11.7*  --   CREATININE 4.88*  --  5.38* 7.40*  --  9.69*  --   --   LATICACIDVEN  --  1.72 1.8  --   --   --   --   --   VANCORANDOM  --   --   --   --   --   --   --  10    Estimated Creatinine Clearance: 3.6 mL/min (A) (by C-G formula based on SCr of 9.69 mg/dL (H)).    No Known Allergies  Antimicrobials this admission: Unasyn 5/17>> Vanc 5/14 >>5/17 (Doses: 1g (5/14 @ 2145) 500 mg (5/16 @ 1130) Zosyn 5/15 >>5/15   Microbiology results: 5/14 BCx >> 2/2 E faecalis (pan-S) 5/14 Bcx >> Acinetobacter (I-CTX, S-all else including unasyn) 5/15 MRSA PCR >> positive 5/15 BCx x 1: neg  Thank you for allowing pharmacy to be a part of this patient's care.  Nada Godley D. Kieley Akter, PharmD, BCPS Clinical Pharmacist [phone] 08/03/2017 8:44 AM

## 2017-08-03 NOTE — Progress Notes (Addendum)
[facility] Rounding Note  Dialyzes at [facility]-Dr. Richard Miu hours EDW 129 pounds. HD Bath 2/2.5, Dialyzer 180, Heparin 2000 bolus, then with hourly rate . Access AVG. 15-gauge blood flow rate 400 on Mircera 130 mcg to q. weeks, last given on 5/9.  Hectorol 2.5 q. treatment  Background 84 year old black female PMH HTN, CAD, DM, prior stroke, ESRD TTS with [facility]. Admitted w/ weakness after dialysis, febrile, borderline hypotensive, found to have 1/2 BC+ enterococcus, the other for Acinetobacter. Current ATB is Unasyn. TEE neg for vegetation.   Assessment/Recommendations 1. Enterococcal and Acinetobacter bacteremia - 1 BC + for each. ID is involved.  Unknown source of infection.  AV graft does not appear to be the source.  Currently on Unasyn - ID has said OK to change to vanco and fortaz to end 08/10/17 and can get these at her outpt HD unit. Ask pharmacy for vanco dosing. Elita Quick will be 2 grams. (Primary has not yet ordered) 2. ESRD: Normally TTS at [facility] unit followed by Dr. Leretha Dykes via AVG. HD today. 3. Hypertension: On a significant regimen of clonidine patch, lisinopril, Lopressor, nifedipine. Unclear why she needs so many blood pressure medications. Clonidine has been reduced. Sig hypotensive episode with last HD, has rebounded back up   4. Anemia of ESRD: Last Mircera on 5/9 as outpt.  No plans for dosing further here as all Hb's >10.  5. Secondary HPT - Hectorol 2.5 q. treatment.  She is on PhosLo and that has been continued 6. Dispo - ATB plan in place (just need pharmacy to clarify dosing). Pt says may have to go Rehab and OT/PT both recommending SNF.   Camille Bal, MD [facility] [phone] Pager 08/03/2017, 12:32 PM  Subjective:    ID has said OK to change ATB's from unasyn to V/F For HD today Decisions now re possible rehab  Objective Vital signs in last 24 hours: Vitals:   08/02/17 1659 08/02/17 1959 08/03/17 0640 08/03/17 0757  BP: 114/87 (!) 164/96 (!) 121/55 (!) 125/46   Pulse: 94 91 78 73  Resp:  16  18  Temp: 98.2 F (36.8 C) 99.1 F (37.3 C) (!) 97.3 F (36.3 C) 98.3 F (36.8 C)  TempSrc: Oral Oral Oral   SpO2: 100% 95% 96% 98%  Weight:  62 kg (136 lb 11 oz)    Height:       Weight change: -3.518 kg (-7 lb 12.1 oz)  Intake/Output Summary (Last 24 hours) at 08/03/2017 1232 Last data filed at 08/03/2017 0945 Gross per 24 hour  Intake 720 ml  Output -  Net 720 ml   Physical Exam: Extremely fatigued. Seen in HD  Much more alert than yesterday VS as noted Regular S1S2 No S3 Lungs clear Soft NT abd No edema LE's L AVG + bruit currently cannulated   Recent Labs  Lab 07/28/17 0203 07/29/17 0858 08/01/17 2300  NA 139 140 137  K 4.1 4.5 4.9  CL 100* 100* 97*  CO2 GLUCOSE 197* 145* 158*  BUN 23* 44* 64*  CREATININE 5.38* 7.40* 9.69*  CALCIUM 8.0* 8.1* 9.0  PHOS  --  5.8* 3.7    Recent Labs  Lab 07/27/17 1911 07/28/17 0203 07/29/17 0858 08/01/17 2300  AST 47* 54*  --   --   ALT 39 43  --   --   ALKPHOS 124 113  --   --   BILITOT 0.9 0.7  --   --   PROT 7.5 6.9  --   --  ALBUMIN 2.9* 2.7* 2.4* 2.3*    Recent Labs  Lab 07/27/17 1911 07/28/17 0203 07/29/17 0858 08/01/17 0454 08/01/17 2332  WBC 12.0* 9.5 8.6 11.9* 11.7*  NEUTROABS 10.2* 7.4  --   --   --   HGB 11.8* 10.8* 10.5* 10.8* 10.9*  HCT 38.3 35.9* 34.3* 34.9* 34.8*  MCV 91.4 92.3 91.0 91.4 89.0  PLT 277 247 231 236 234    Recent Labs  Lab 07/28/17 0203  CKTOTAL 129  TROPONINI 0.07*    Recent Labs  Lab 08/02/17 1136 08/02/17 1642 08/02/17 2206 08/03/17 0755 08/03/17 1145  GLUCAP 132* 152* 149* 151* 155*    Medications: Infusions: . ampicillin-sulbactam (UNASYN) IV 3 g (08/03/17 1111)    Scheduled Medications: . aspirin EC  81 mg Oral Daily  . calcium acetate  1,334 mg Oral TID WC  . [START ON 08/04/2017] cloNIDine  0.2 mg Transdermal Q7 days  . doxercalciferol  2.5 mcg Oral Q T,Th,Sa-HD  . feeding supplement (NEPRO CARB  STEADY)  237 mL Oral BID BM  . fluticasone furoate-vilanterol  1 puff Inhalation Daily  . heparin  5,000 Units Subcutaneous Q8H  . insulin aspart  0-9 Units Subcutaneous TID WC  . insulin glargine  5 Units Subcutaneous QHS  . isosorbide mononitrate  30 mg Oral QPM  . lisinopril  40 mg Oral QPM  . metoprolol tartrate  12.5 mg Oral BID  . NIFEdipine  60 mg Oral BID  . nitroGLYCERIN  0.4 mg Sublingual See admin instructions  . pravastatin  40 mg Oral Daily  . tiotropium  1 capsule Inhalation Daily

## 2017-08-04 LAB — GLUCOSE, CAPILLARY
GLUCOSE-CAPILLARY: 134 mg/dL — AB (ref 65–99)
Glucose-Capillary: 246 mg/dL — ABNORMAL HIGH (ref 65–99)

## 2017-08-04 MED ORDER — DARBEPOETIN ALFA 100 MCG/0.5ML IJ SOSY
100.0000 ug | PREFILLED_SYRINGE | INTRAMUSCULAR | Status: DC
  Filled 2017-08-04: qty 0.5

## 2017-08-04 MED ORDER — DARBEPOETIN ALFA 100 MCG/0.5ML IJ SOSY: 100.0000 ug | PREFILLED_SYRINGE | INTRAMUSCULAR | Status: DC

## 2017-08-04 NOTE — Progress Notes (Addendum)
[facility] Rounding Note  Dialyzes at [facility]-Dr. Richard Miu hours EDW 129 pounds. HD Bath 2/2.5, Dialyzer 180, Heparin 2000 bolus, then with hourly rate . Access AVG. 15-gauge blood flow rate 400 on Mircera 130 mcg to q. weeks, last given on 5/9.  Hectorol 2.5 q. treatment  Background 84 year old black female PMH HTN, CAD, DM, prior stroke, ESRD TTS with [facility]. Admitted w/ weakness after dialysis, febrile, borderline hypotensive, found to have 1/2 BC+ enterococcus, the other for Acinetobacter. TEE neg for vegetation.   Assessment/Recommendations 1. Enterococcal and Acinetobacter bacteremia - 1 BC + for each. ID is involved.  Unknown source of infection.  AV graft does not appear to be the source.  ID has said OK to change Unasyn to vanco and fortaz to end 08/10/17 and can get these at her outpt HD unit.  2. ESRD: TTS at [facility] unit followed by Dr. Leretha Dykes via AVG. Last HD 5/21. 3. Hypertension: On a significant regimen of clonidine patch, lisinopril, Lopressor, nifedipine. Unclear why she needs so many blood pressure medications. Clonidine has been reduced.  4. Anemia of ESRD: Last Mircera on 5/9 as outpt.  Hb at HD yesterday returned at 9.6. If still here tomorrow 5/23 will dose with Aranesp. (had not required ESA dosing here as all prior Hb were >10) 5. Secondary HPT - Hectorol 2.5 q. treatment.  She is on PhosLo and that has been continued  Dispo - ATB plan in place, pharmacy has clarified dosing. Waiting on SNF placement. Will put in orders for tomorrow AM in case not discharged today.   Camille Bal, MD [facility] [phone] Pager 08/04/2017, 7:39 AM  Subjective:    SNF placement pending for rehab Discharge was delayed HD yesterday well tolerated Post weight 62 kg.  Objective Vital signs in last 24 hours: Vitals:   08/03/17 1615 08/03/17 1735 08/03/17 2024 08/04/17 0452  BP: (!) 141/64 (!) 159/58 138/73 (!) 170/58  Pulse: 78 82 86 82  Resp: Temp: (!) 96.9 F (36.1  C) 98.2 F (36.8 C) 98.4 F (36.9 C) 98.6 F (37 C)  TempSrc: Oral Oral Oral Oral  SpO2: 100% 100% 100% 98%  Weight:   62 kg (136 lb 11 oz)   Height:       Weight change: 4.218 kg (9 lb 4.8 oz)  Intake/Output Summary (Last 24 hours) at 08/04/2017 0739 Last data filed at 08/04/2017 0200 Gross per 24 hour  Intake 760 ml  Output 1600 ml  Net -840 ml   Physical Exam: NAD VS as noted Regular S1S2 No S3 Lungs clear Soft NT abd No edema LE's L AVG + bruit    Recent Labs  Lab 07/29/17 0858 08/01/17 2300 08/03/17 1431  NA 140 137 138  K 4.5 4.9 4.3  CL 100* 97* 97*  CO2 GLUCOSE 145* 158* 124*  BUN 44* 64* 42*  CREATININE 7.40* 9.69* 7.09*  CALCIUM 8.1* 9.0 8.6*  PHOS 5.8* 3.7 4.0    Recent Labs  Lab 07/29/17 0858 08/01/17 2300 08/03/17 1431  ALBUMIN 2.4* 2.3* 2.2*    Recent Labs  Lab 07/29/17 0858 08/01/17 0454 08/01/17 2332 08/03/17 1431  WBC 8.6 11.9* 11.7* 9.7  HGB 10.5* 10.8* 10.9* 9.6*  HCT 34.3* 34.9* 34.8* 31.2*  MCV 91.0 91.4 89.0 90.7  PLT 231 236 234 223    No results for input(s): CKTOTAL, CKMB, CKMBINDEX, TROPONINI in the last 168 hours.  Recent Labs  Lab 08/02/17 2206 08/03/17  6578 08/03/17 1145 08/03/17 1734 08/03/17 2107  GLUCAP 149* 151* 155* 79 147*    Medications: Infusions: . ampicillin-sulbactam (UNASYN) IV Stopped (08/03/17 2346)    Scheduled Medications: . aspirin EC  81 mg Oral Daily  . calcium acetate  1,334 mg Oral TID WC  . cloNIDine  0.2 mg Transdermal Q7 days  . doxercalciferol  2.5 mcg Oral Q T,Th,Sa-HD  . feeding supplement (NEPRO CARB STEADY)  237 mL Oral BID BM  . fluticasone furoate-vilanterol  1 puff Inhalation Daily  . heparin  5,000 Units Subcutaneous Q8H  . insulin aspart  0-9 Units Subcutaneous TID WC  . insulin glargine  5 Units Subcutaneous QHS  . isosorbide mononitrate  30 mg Oral QPM  . lisinopril  40 mg Oral QPM  . metoprolol tartrate  12.5 mg Oral BID  . NIFEdipine  60 mg Oral  BID  . nitroGLYCERIN  0.4 mg Sublingual See admin instructions  . pravastatin  40 mg Oral Daily  . tiotropium  1 capsule Inhalation Daily

## 2017-08-04 NOTE — Clinical Social Work Placement (Signed)
   CLINICAL SOCIAL WORK PLACEMENT  NOTE 08/04/17 DISCHARGED TO [facility] BY DAUGHTER  Date:  08/04/2017  Patient Details  Name: Nicole Cox MRN: [id number] Date of Birth: 1932/01/10  Clinical Social Work is seeking post-discharge placement for this patient at the Skilled  Nursing Facility level of care (*CSW will initial, date and re-position this form in  chart as items are completed):  Yes   Patient/family provided with [place] Clinical Social Work Department's list of facilities offering this level of care within the geographic area requested by the patient (or if unable, by the patient's family).  Yes   Patient/family informed of their freedom to choose among providers that offer the needed level of care, that participate in Medicare, Medicaid or managed care program needed by the patient, have an available bed and are willing to accept the patient.  Yes   Patient/family informed of [place]'s ownership interest in [facility] and [facility], as well as of the fact that they are under no obligation to receive care at these facilities.  PASRR submitted to EDS on 07/31/17     PASRR number received on 07/31/17     Existing PASRR number confirmed on       FL2 transmitted to all facilities in geographic area requested by pt/family on 07/31/17     FL2 transmitted to all facilities within larger geographic area on       Patient informed that his/her managed care company has contracts with or will negotiate with certain facilities, including the following:        Yes   Patient/family informed of bed offers received.  Patient chooses bed at [facility], [facility]     Physician recommends and patient chooses bed at      Patient to be transferred to [facility] on 08/04/17.  Patient to be transferred to facility by [name]     Patient family notified on 08/04/17 of transfer.  Name of family member  notified:  [name] [phone])     PHYSICIAN       Additional Comment:    _______________________________________________ Cristobal Goldmann, LCSW 08/04/2017, 12:17 PM

## 2017-08-04 NOTE — Progress Notes (Addendum)
Patient is discharged to SNF, spoke with family member [name] about transportation. Family elected to pick patient up and agreed to bring 02 tank at that time. Patients daughter Darylene Price arrived to pick up patient without 02 tank. Patient 02 sats were 86% on room air. Discussed transporting without 02 would not be a safe discharge plan, but both daughters refused to go get patients take and did not want to use [facility]. Chartered loss adjuster. Plan to loan 02 tank to [employer] and a family member will return tank.   Patients daughter, Darylene Price is taking a MC5W 02 tank and will bring it back 5/23.  Can be contacted at [phone]

## 2017-08-04 NOTE — Progress Notes (Signed)
Titrated FIO2 due to sats of 100%.

## 2017-08-04 NOTE — Progress Notes (Signed)
[facility]                                                                              Patient Demographics  Nicole Cox, is a 84 y.o. female, DOB - [birth year], SNK:[id number]  Admit date - 07/27/2017   Admitting Physician Rise Patience, MD  Outpatient Primary MD for the patient is Geraldo Docker, MD  Outpatient specialists:   LOS - 5  days   Medical records reviewed and are as summarized below:    Chief Complaint  Patient presents with  . Weakness       Brief summary  Nicole Wilsonis a 84 y.o.femalewithhistory of ESRD on hemodialysis, hypertension, CAD, diabetes mellitus and previous stroke was brought to the ER after patient was found to be weak. As per the daughter patient has been getting weak last 2 days. Following dialysis patient had progressive weakness was brought to the ED for further evaluation. She was found to have a temperature of 102.0, met criteria for SIRS, borderline hypotensive. Subsequently admitted, cultures were obtained, initiated on spectrum antibiotics  Assessment & Plan    Principal Problem:   SIRS (systemic inflammatory response syndrome) (HCC) with enterococcus bacteremia Blood cultures 1 set positive for enterococcus, other set positive for Acinetobacter Etiology unclear, initially placed on vancomycin and Zosyn.  Subsequently changed to Unasyn by ID. TEE did not show any vegetations ID recommended vancomycin and Fortaz with hemodialysis through May 28.  Active Problems:   Hypertension -Currently stable however labile BP during the hospitalizations, dropping down to 90s with dialysis.  Clonidine was reduced, continue other medications.    DM (diabetes mellitus), type 2 with renal complications (HCC) -On Lantus insulin 5 units at bedtime, no further episodes of hypoglycemia    CAD (coronary artery disease) -Stable, continue home medications, no chest pain     ESRD on hemodialysis (HCC) On hemodialysis TTS,  continue vancomycin and Fortaz with hemodialysis through May 28   anemia of chronic kidney disease:  - H&H currently stable  Prior history of CVA Currently stable, continue aspirin, CT head unremarkable  Code Status: Full code DVT Prophylaxis: Heparin subcu Family Communication: Discussed in detail with the patient, all imaging results, lab results explained to the patient    Disposition Plan: DC to skilled nursing facility today, discharge summary done by Dr. [place] Pink on 5/21, no changes. Stable for discharge.   Time Spent in minutes  25 minutes  Procedures:  2D echo:  EF 55 to 60% Consultants:   Nephrology ID  Antimicrobials:      Medications  Scheduled Meds: . aspirin EC  81 mg Oral Daily  . calcium acetate  1,334 mg Oral TID WC  . cloNIDine  0.2 mg Transdermal Q7 days  . darbepoetin (ARANESP) injection - DIALYSIS  100 mcg Intravenous Q Wed-HD  . doxercalciferol  2.5 mcg Oral Q T,Th,Sa-HD  . feeding supplement (NEPRO CARB STEADY)  237 mL Oral BID BM  . fluticasone furoate-vilanterol  1 puff Inhalation Daily  . heparin  5,000 Units Subcutaneous Q8H  . insulin aspart  0-9 Units Subcutaneous TID WC  . insulin glargine  5 Units  Subcutaneous QHS  . isosorbide mononitrate  30 mg Oral QPM  . lisinopril  40 mg Oral QPM  . metoprolol tartrate  12.5 mg Oral BID  . NIFEdipine  60 mg Oral BID  . nitroGLYCERIN  0.4 mg Sublingual See admin instructions  . pravastatin  40 mg Oral Daily  . tiotropium  1 capsule Inhalation Daily   Continuous Infusions: . ampicillin-sulbactam (UNASYN) IV Stopped (08/03/17 2346)   PRN Meds:.acetaminophen **OR** acetaminophen, albuterol, ondansetron **OR** ondansetron (ZOFRAN) IV, traMADol, traZODone   Antibiotics   Anti-infectives (From admission, onward)   Start     Dose/Rate Route Frequency Ordered Stop   08/03/17 0000  vancomycin in sodium chloride 0.9 % 100 mL     100 mL/hr over 60 Minutes   08/03/17 0921     08/03/17 0000   cefTAZidime in dextrose 5 % 50 mL     100 mL/hr over 30 Minutes   08/03/17 0921     07/30/17 2200  ampicillin (OMNIPEN) 2 g in sodium chloride 0.9 % 100 mL IVPB  Status:  Discontinued     2 g 300 mL/hr over 20 Minutes Intravenous Every 24 hours 07/30/17 1142 07/30/17 1246   07/30/17 2200  ampicillin (OMNIPEN) 2 g in sodium chloride 0.9 % 100 mL IVPB  Status:  Discontinued     2 g 300 mL/hr over 20 Minutes Intravenous 2 times daily 07/30/17 1246 07/30/17 1514   07/30/17 2200  Ampicillin-Sulbactam (UNASYN) 3 g in sodium chloride 0.9 % 100 mL IVPB     3 g 200 mL/hr over 30 Minutes Intravenous Every 12 hours 07/30/17 1514     07/29/17 1200  vancomycin (VANCOCIN) 500 mg in sodium chloride 0.9 % 100 mL IVPB  Status:  Discontinued     500 mg 100 mL/hr over 60 Minutes Intravenous Every T-Th-Sa (Hemodialysis) 07/28/17 0118 07/30/17 1142   07/29/17 1130  vancomycin (VANCOCIN) 500-5 MG/100ML-% IVPB    Note to Pharmacy:  Kennon Rounds   : cabinet override      07/29/17 1130 07/29/17 1130   07/28/17 0130  piperacillin-tazobactam (ZOSYN) IVPB 3.375 g  Status:  Discontinued     3.375 g 12.5 mL/hr over 240 Minutes Intravenous Every 12 hours 07/28/17 0118 07/28/17 1325   07/28/17 0115  piperacillin-tazobactam (ZOSYN) IVPB 3.375 g  Status:  Discontinued     3.375 g 100 mL/hr over 30 Minutes Intravenous  Once 07/28/17 0108 07/28/17 0117   07/27/17 2145  ceFEPIme (MAXIPIME) 2 g in sodium chloride 0.9 % 100 mL IVPB     2 g 200 mL/hr over 30 Minutes Intravenous  Once 07/27/17 2134 07/27/17 2248   07/27/17 2145  vancomycin (VANCOCIN) IVPB 1000 mg/200 mL premix     1,000 mg 200 mL/hr over 60 Minutes Intravenous  Once 07/27/17 2134 07/28/17 0135        Subjective:   Nicole Cox was seen and examined today.  Talking on the phone at the time of my encounter, alert and oriented, denies any specific complaints, hoping to get discharged today.  No acute events overnight.    Objective:   Vitals:    08/04/17 0452 08/04/17 0944 08/04/17 1039 08/04/17 1043  BP: (!) 170/58 (!) 166/57    Pulse: 82 87 89   Resp: 16 20 16    Temp: 98.6 F (37 C) 98.7 F (37.1 C)    TempSrc: Oral Oral    SpO2: 98% 100% 100% 100%  Weight:      Height:  Intake/Output Summary (Last 24 hours) at 08/04/2017 1051 Last data filed at 08/04/2017 0700 Gross per 24 hour  Intake 520 ml  Output 1600 ml  Net -1080 ml     Wt Readings from Last 3 Encounters:  08/03/17 62 kg (136 lb 11 oz)  06/10/17 55.3 kg (121 lb 14.6 oz)  12/14/15 69.2 kg (152 lb 8.9 oz)     Exam  General: Alert and oriented x 3, NAD  Eyes:   HEENT:  Atraumatic, normocephalic, normal oropharynx  Cardiovascular: S1 S2 auscultated, . Regular rate and rhythm.  Respiratory: Clear to auscultation bilaterally, no wheezing, rales or rhonchi  Gastrointestinal: Soft, nontender, nondistended, + bowel sounds  Ext: no pedal edema bilaterally  Neuro: no new FND  Musculoskeletal: No digital cyanosis, clubbing  Skin: No rashes  Psych: Normal affect and demeanor, alert and oriented x3    Data Reviewed:  I have personally reviewed following labs and imaging studies  Micro Results Recent Results (from the past 240 hour(s))  Culture, blood (Routine x 2)     Status: Abnormal   Collection Time: 07/27/17  7:13 PM  Result Value Ref Range Status   Specimen Description BLOOD RIGHT FOREARM  Final   Special Requests   Final    BOTTLES DRAWN AEROBIC AND ANAEROBIC Blood Culture adequate volume   Culture  Setup Time   Final    GRAM POSITIVE COCCI IN BOTH AEROBIC AND ANAEROBIC BOTTLES CRITICAL RESULT CALLED TO, READ BACK BY AND VERIFIED WITH: PHARMD E Odin 07/28/17 1305 PM BY MORAC    Culture (A)  Final    ENTEROCOCCUS FAECALIS SUSCEPTIBILITIES PERFORMED ON PREVIOUS CULTURE WITHIN THE LAST 5 DAYS. ACINETOBACTER CALCOACETICUS/BAUMANNII COMPLEX STAPHYLOCOCCUS SPECIES (COAGULASE NEGATIVE) THE SIGNIFICANCE OF ISOLATING THIS ORGANISM FROM  A SINGLE SET OF BLOOD CULTURES WHEN MULTIPLE SETS ARE DRAWN IS UNCERTAIN. PLEASE NOTIFY THE MICROBIOLOGY DEPARTMENT WITHIN ONE WEEK IF SPECIATION AND SENSITIVITIES ARE REQUIRED. CRITICAL RESULT CALLED TO, READ BACK BY AND VERIFIED WITH: E SINCLAIR,PHARMD AT 1450 07/30/17 BY L BENFIELD CONCERNING GROWTH ON CULTURE Performed at [facility], [place] [address] [place], [place] [address]    Report Status 07/31/2017 FINAL  Final   Organism ID, Bacteria ACINETOBACTER CALCOACETICUS/BAUMANNII COMPLEX  Final      Susceptibility   Acinetobacter calcoaceticus/baumannii complex - MIC*    CEFTAZIDIME 4 SENSITIVE Sensitive     CEFTRIAXONE 16 INTERMEDIATE Intermediate     CIPROFLOXACIN <=0.25 SENSITIVE Sensitive     GENTAMICIN <=1 SENSITIVE Sensitive     IMIPENEM <=0.25 SENSITIVE Sensitive     PIP/TAZO <=4 SENSITIVE Sensitive     TRIMETH/SULFA <=20 SENSITIVE Sensitive     CEFEPIME 2 SENSITIVE Sensitive     AMPICILLIN/SULBACTAM <=2 SENSITIVE Sensitive     * ACINETOBACTER CALCOACETICUS/BAUMANNII COMPLEX  Blood Culture ID Panel (Reflexed)     Status: Abnormal   Collection Time: 07/27/17  7:13 PM  Result Value Ref Range Status   Enterococcus species DETECTED (A) NOT DETECTED Final    Comment: CRITICAL RESULT CALLED TO, READ BACK BY AND VERIFIED WITH: PHARMD E SINCLARE 07/28/17 AT 1305 BY CM    Vancomycin resistance NOT DETECTED NOT DETECTED Final   Listeria monocytogenes NOT DETECTED NOT DETECTED Final   Staphylococcus species NOT DETECTED NOT DETECTED Final   Staphylococcus aureus NOT DETECTED NOT DETECTED Final   Streptococcus species NOT DETECTED NOT DETECTED Final   Streptococcus agalactiae NOT DETECTED NOT DETECTED Final   Streptococcus pneumoniae NOT DETECTED NOT DETECTED Final   Streptococcus pyogenes  NOT DETECTED NOT DETECTED Final   Acinetobacter baumannii NOT DETECTED NOT DETECTED Final   Enterobacteriaceae species NOT DETECTED NOT DETECTED Final   Enterobacter cloacae complex NOT  DETECTED NOT DETECTED Final   Escherichia coli NOT DETECTED NOT DETECTED Final   Klebsiella oxytoca NOT DETECTED NOT DETECTED Final   Klebsiella pneumoniae NOT DETECTED NOT DETECTED Final   Proteus species NOT DETECTED NOT DETECTED Final   Serratia marcescens NOT DETECTED NOT DETECTED Final   Haemophilus influenzae NOT DETECTED NOT DETECTED Final   Neisseria meningitidis NOT DETECTED NOT DETECTED Final   Pseudomonas aeruginosa NOT DETECTED NOT DETECTED Final   Candida albicans NOT DETECTED NOT DETECTED Final   Candida glabrata NOT DETECTED NOT DETECTED Final   Candida krusei NOT DETECTED NOT DETECTED Final   Candida parapsilosis NOT DETECTED NOT DETECTED Final   Candida tropicalis NOT DETECTED NOT DETECTED Final    Comment: Performed at [facility], [place] [address], [place], [place] [address]  Culture, blood (Routine x 2)     Status: Abnormal   Collection Time: 07/27/17  7:18 PM  Result Value Ref Range Status   Specimen Description BLOOD RIGHT HAND  Final   Special Requests   Final    BOTTLES DRAWN AEROBIC AND ANAEROBIC Blood Culture adequate volume   Culture  Setup Time   Final    GRAM POSITIVE COCCI IN CHAINS IN BOTH AEROBIC AND ANAEROBIC BOTTLES CRITICAL VALUE NOTED.  VALUE IS CONSISTENT WITH PREVIOUSLY REPORTED AND CALLED VALUE. Performed at [facility], [place] [address], [place], [place] [address]    Culture ENTEROCOCCUS FAECALIS (A)  Final   Report Status 07/30/2017 FINAL  Final   Organism ID, Bacteria ENTEROCOCCUS FAECALIS  Final      Susceptibility   Enterococcus faecalis - MIC*    AMPICILLIN <=2 SENSITIVE Sensitive     VANCOMYCIN 2 SENSITIVE Sensitive     GENTAMICIN SYNERGY SENSITIVE Sensitive     * ENTEROCOCCUS FAECALIS  MRSA PCR Screening     Status: Abnormal   Collection Time: 07/28/17  1:28 AM  Result Value Ref Range Status   MRSA by PCR POSITIVE (A) NEGATIVE Final    Comment:        The GeneXpert MRSA Assay (FDA approved for NASAL specimens only),  is one component of a comprehensive MRSA colonization surveillance program. It is not intended to diagnose MRSA infection nor to guide or monitor treatment for MRSA infections. RESULT CALLED TO, READ BACK BY AND VERIFIED WITH: Gypsy Lore RN 07/28/17 0358 JDW Performed at [facility], [place] [address] [place], [place] [address]   Culture, blood (Routine X 2) w Reflex to ID Panel     Status: None   Collection Time: 07/28/17  3:37 PM  Result Value Ref Range Status   Specimen Description BLOOD RIGHT ANTECUBITAL  Final   Special Requests   Final    BOTTLES DRAWN AEROBIC ONLY Blood Culture adequate volume   Culture   Final    NO GROWTH 5 DAYS Performed at [facility], [place] [address] [place], [place] [address]    Report Status 08/02/2017 FINAL  Final    Radiology Reports Dg Chest 2 View  Result Date: 07/27/2017 CLINICAL DATA:  Acute onset of generalized weakness and fever. EXAM: CHEST - 2 VIEW COMPARISON:  Chest radiograph performed 06/07/2017 FINDINGS: The lungs are well-aerated. Vascular congestion is noted. Increased interstitial markings raise concern for pulmonary edema. Small bilateral pleural effusions are suspected.  Chronic parenchymal changes are again noted at the right lung base. No pneumothorax is seen. The heart is borderline enlarged. No acute osseous abnormalities are seen. IMPRESSION: Vascular congestion and borderline cardiomegaly. Increased interstitial markings raise concern for pulmonary edema. Small bilateral pleural effusions suspected. Electronically Signed   By: Garald Balding M.D.   On: 07/27/2017 21:11   Ct Head Wo Contrast  Result Date: 07/27/2017 CLINICAL DATA:  Weakness after leaving dialysis today. History of stroke, hypertension and diabetes. EXAM: CT HEAD WITHOUT CONTRAST TECHNIQUE: Contiguous axial images were obtained from the base of the skull through the vertex without intravenous contrast. COMPARISON:  CT HEAD Jul 30, 2015 FINDINGS: BRAIN: No  intraparenchymal hemorrhage, mass effect nor midline shift. The ventricles and sulci are normal for age. Patchy supratentorial white matter hypodensities less than expected for patient's age, though non-specific are most compatible with chronic small vessel ischemic disease. No acute large vascular territory infarcts. No abnormal extra-axial fluid collections. Basal cisterns are patent. VASCULAR: Moderate to severe calcific atherosclerosis of the carotid siphons. SKULL: No skull fracture. No significant scalp soft tissue swelling. Partially empty sella. SINUSES/ORBITS: The mastoid air-cells and included paranasal sinuses are well-aerated.The included ocular globes and orbital contents are non-suspicious. Status post bilateral ocular lens implants. OTHER: None. IMPRESSION: Negative noncontrast CT HEAD for age. Electronically Signed   By: Elon Alas M.D.   On: 07/27/2017 22:53    Lab Data:  CBC: Recent Labs  Lab 07/29/17 0858 08/01/17 0454 08/01/17 2332 08/03/17 1431  WBC 8.6 11.9* 11.7* 9.7  HGB 10.5* 10.8* 10.9* 9.6*  HCT 34.3* 34.9* 34.8* 31.2*  MCV 91.0 91.4 89.0 90.7  PLT 231 236 234 753   Basic Metabolic Panel: Recent Labs  Lab 07/29/17 0858 08/01/17 2300 08/03/17 1431  NA 140 137 138  K 4.5 4.9 4.3  CL 100* 97* 97*  CO2 24 26 29   GLUCOSE 145* 158* 124*  BUN 44* 64* 42*  CREATININE 7.40* 9.69* 7.09*  CALCIUM 8.1* 9.0 8.6*  PHOS 5.8* 3.7 4.0   GFR: Estimated Creatinine Clearance: 5 mL/min (A) (by C-G formula based on SCr of 7.09 mg/dL (H)). Liver Function Tests: Recent Labs  Lab 07/29/17 0858 08/01/17 2300 08/03/17 1431  ALBUMIN 2.4* 2.3* 2.2*   No results for input(s): LIPASE, AMYLASE in the last 168 hours. No results for input(s): AMMONIA in the last 168 hours. Coagulation Profile: No results for input(s): INR, PROTIME in the last 168 hours. Cardiac Enzymes: No results for input(s): CKTOTAL, CKMB, CKMBINDEX, TROPONINI in the last 168 hours. BNP (last 3  results) No results for input(s): PROBNP in the last 8760 hours. HbA1C: No results for input(s): HGBA1C in the last 72 hours. CBG: Recent Labs  Lab 08/03/17 0755 08/03/17 1145 08/03/17 1734 08/03/17 2107 08/04/17 0825  GLUCAP 151* 155* 79 147* 246*   Lipid Profile: No results for input(s): CHOL, HDL, LDLCALC, TRIG, CHOLHDL, LDLDIRECT in the last 72 hours. Thyroid Function Tests: No results for input(s): TSH, T4TOTAL, FREET4, T3FREE, THYROIDAB in the last 72 hours. Anemia Panel: No results for input(s): VITAMINB12, FOLATE, FERRITIN, TIBC, IRON, RETICCTPCT in the last 72 hours. Urine analysis:    Component Value Date/Time   COLORURINE YELLOW 08/11/2012 1917   APPEARANCEUR CLOUDY (A) 08/11/2012 1917   LABSPEC 1.013 08/11/2012 1917   PHURINE 7.5 08/11/2012 1917   GLUCOSEU 100 (A) 08/11/2012 1917   HGBUR TRACE (A) 08/11/2012 [place] 08/11/2012 [place] 08/11/2012 1917   PROTEINUR >300 (  A) 08/11/2012 1917   UROBILINOGEN 0.2 08/11/2012 1917   NITRITE NEGATIVE 08/11/2012 1917   LEUKOCYTESUR NEGATIVE 08/11/2012 1917     Challen Spainhour M.D. [facility] 08/04/2017, 10:51 AM  Pager: [phone] Between 7am to 7pm - call Pager - [phone]  After 7pm go to [email or web address] - password [email or web address]  Call night coverage person covering after 7pm

## 2017-08-04 NOTE — Progress Notes (Signed)
Attempted to call report x2, name and number left with Elita Quick

## 2017-08-04 NOTE — Progress Notes (Addendum)
Was brought my attention by Staff RN Luther Parody, that patient is being transported by daughter at discharge and is here to pick-up daughter without DME: oxygen; and they have refused transport via EMS. NCM advised nurse in order to request DME from [facility] requires active account prior to admission or current order with narrative.  NCM also reminded Luther Parody that there is a process to be completed by [facility] for DME if there is time is of the essence. Caitlin verbalized understanding.  Nicole Cox, BSN, RN Nurse Case Production designer, theatre/television/film

## 2017-09-25 ENCOUNTER — Emergency Department (HOSPITAL_COMMUNITY): Payer: Medicare HMO

## 2017-09-25 ENCOUNTER — Other Ambulatory Visit: Payer: Self-pay

## 2017-09-25 ENCOUNTER — Encounter (HOSPITAL_COMMUNITY): Payer: Self-pay | Admitting: Internal Medicine

## 2017-09-25 ENCOUNTER — Inpatient Hospital Stay (HOSPITAL_COMMUNITY)
Admission: EM | Admit: 2017-09-25 | Discharge: 2017-10-03 | DRG: 871 | Disposition: A | Discharge status: Skilled Nursing Facility | Payer: Medicare HMO | Attending: Internal Medicine | Admitting: Internal Medicine

## 2017-09-25 DIAGNOSIS — Z79899 Other long term (current) drug therapy: Secondary | ICD-10-CM

## 2017-09-25 DIAGNOSIS — E1121 Type 2 diabetes mellitus with diabetic nephropathy: Secondary | ICD-10-CM | POA: Diagnosis not present

## 2017-09-25 DIAGNOSIS — J449 Chronic obstructive pulmonary disease, unspecified: Secondary | ICD-10-CM | POA: Diagnosis not present

## 2017-09-25 DIAGNOSIS — Z833 Family history of diabetes mellitus: Secondary | ICD-10-CM | POA: Diagnosis not present

## 2017-09-25 DIAGNOSIS — A4159 Other Gram-negative sepsis: Secondary | ICD-10-CM | POA: Diagnosis present

## 2017-09-25 DIAGNOSIS — I5032 Chronic diastolic (congestive) heart failure: Secondary | ICD-10-CM | POA: Diagnosis present

## 2017-09-25 DIAGNOSIS — D631 Anemia in chronic kidney disease: Secondary | ICD-10-CM | POA: Diagnosis present

## 2017-09-25 DIAGNOSIS — I2510 Atherosclerotic heart disease of native coronary artery without angina pectoris: Secondary | ICD-10-CM | POA: Diagnosis present

## 2017-09-25 DIAGNOSIS — Z794 Long term (current) use of insulin: Secondary | ICD-10-CM

## 2017-09-25 DIAGNOSIS — Z8249 Family history of ischemic heart disease and other diseases of the circulatory system: Secondary | ICD-10-CM

## 2017-09-25 DIAGNOSIS — R410 Disorientation, unspecified: Secondary | ICD-10-CM | POA: Diagnosis present

## 2017-09-25 DIAGNOSIS — J440 Chronic obstructive pulmonary disease with acute lower respiratory infection: Secondary | ICD-10-CM | POA: Diagnosis present

## 2017-09-25 DIAGNOSIS — I132 Hypertensive heart and chronic kidney disease with heart failure and with stage 5 chronic kidney disease, or end stage renal disease: Secondary | ICD-10-CM | POA: Diagnosis present

## 2017-09-25 DIAGNOSIS — N186 End stage renal disease: Secondary | ICD-10-CM

## 2017-09-25 DIAGNOSIS — Z90710 Acquired absence of both cervix and uterus: Secondary | ICD-10-CM | POA: Diagnosis not present

## 2017-09-25 DIAGNOSIS — Z23 Encounter for immunization: Secondary | ICD-10-CM

## 2017-09-25 DIAGNOSIS — J690 Pneumonitis due to inhalation of food and vomit: Secondary | ICD-10-CM | POA: Diagnosis present

## 2017-09-25 DIAGNOSIS — J189 Pneumonia, unspecified organism: Secondary | ICD-10-CM | POA: Diagnosis present

## 2017-09-25 DIAGNOSIS — M79609 Pain in unspecified limb: Secondary | ICD-10-CM | POA: Diagnosis not present

## 2017-09-25 DIAGNOSIS — J156 Pneumonia due to other aerobic Gram-negative bacteria: Secondary | ICD-10-CM | POA: Diagnosis not present

## 2017-09-25 DIAGNOSIS — R509 Fever, unspecified: Secondary | ICD-10-CM | POA: Diagnosis present

## 2017-09-25 DIAGNOSIS — I10 Essential (primary) hypertension: Secondary | ICD-10-CM | POA: Diagnosis not present

## 2017-09-25 DIAGNOSIS — Y95 Nosocomial condition: Secondary | ICD-10-CM | POA: Diagnosis present

## 2017-09-25 DIAGNOSIS — Z7951 Long term (current) use of inhaled steroids: Secondary | ICD-10-CM | POA: Diagnosis not present

## 2017-09-25 DIAGNOSIS — R1310 Dysphagia, unspecified: Secondary | ICD-10-CM | POA: Diagnosis present

## 2017-09-25 DIAGNOSIS — A419 Sepsis, unspecified organism: Secondary | ICD-10-CM | POA: Diagnosis not present

## 2017-09-25 DIAGNOSIS — Z823 Family history of stroke: Secondary | ICD-10-CM

## 2017-09-25 DIAGNOSIS — R6510 Systemic inflammatory response syndrome (SIRS) of non-infectious origin without acute organ dysfunction: Secondary | ICD-10-CM | POA: Diagnosis present

## 2017-09-25 DIAGNOSIS — G92 Toxic encephalopathy: Secondary | ICD-10-CM | POA: Diagnosis present

## 2017-09-25 DIAGNOSIS — E1129 Type 2 diabetes mellitus with other diabetic kidney complication: Secondary | ICD-10-CM | POA: Diagnosis present

## 2017-09-25 DIAGNOSIS — J9601 Acute respiratory failure with hypoxia: Secondary | ICD-10-CM | POA: Diagnosis present

## 2017-09-25 DIAGNOSIS — Z992 Dependence on renal dialysis: Secondary | ICD-10-CM

## 2017-09-25 DIAGNOSIS — Z6821 Body mass index (BMI) 21.0-21.9, adult: Secondary | ICD-10-CM

## 2017-09-25 DIAGNOSIS — Z87891 Personal history of nicotine dependence: Secondary | ICD-10-CM

## 2017-09-25 DIAGNOSIS — R0902 Hypoxemia: Secondary | ICD-10-CM

## 2017-09-25 DIAGNOSIS — E1122 Type 2 diabetes mellitus with diabetic chronic kidney disease: Secondary | ICD-10-CM | POA: Diagnosis present

## 2017-09-25 DIAGNOSIS — Z8673 Personal history of transient ischemic attack (TIA), and cerebral infarction without residual deficits: Secondary | ICD-10-CM

## 2017-09-25 DIAGNOSIS — E43 Unspecified severe protein-calorie malnutrition: Secondary | ICD-10-CM | POA: Diagnosis present

## 2017-09-25 DIAGNOSIS — Z7982 Long term (current) use of aspirin: Secondary | ICD-10-CM | POA: Diagnosis not present

## 2017-09-25 LAB — COMPREHENSIVE METABOLIC PANEL
ALK PHOS: 106 U/L (ref 38–126)
ALT: 45 U/L — ABNORMAL HIGH (ref 0–44)
ANION GAP: 11 (ref 5–15)
AST: 111 U/L — ABNORMAL HIGH (ref 15–41)
Albumin: 2.3 g/dL — ABNORMAL LOW (ref 3.5–5.0)
BILIRUBIN TOTAL: 1.2 mg/dL (ref 0.3–1.2)
BUN: 14 mg/dL (ref 8–23)
CALCIUM: 7.6 mg/dL — AB (ref 8.9–10.3)
CO2: 25 mmol/L (ref 22–32)
Chloride: 102 mmol/L (ref 98–111)
Creatinine, Ser: 3.83 mg/dL — ABNORMAL HIGH (ref 0.44–1.00)
GFR, EST AFRICAN AMERICAN: 12 mL/min — AB (ref >60)
GFR, EST NON AFRICAN AMERICAN: 10 mL/min — AB (ref >60)
Glucose, Bld: 86 mg/dL (ref 70–99)
Potassium: 5 mmol/L (ref 3.5–5.1)
SODIUM: 138 mmol/L (ref 135–145)
TOTAL PROTEIN: 6.5 g/dL (ref 6.5–8.1)

## 2017-09-25 LAB — CBC WITH DIFFERENTIAL/PLATELET
Abs Immature Granulocytes: 0.3 10*3/uL — ABNORMAL HIGH (ref 0.0–0.1)
BASOS ABS: 0.1 10*3/uL (ref 0.0–0.1)
Basophils Relative: 0 %
EOS PCT: 0 %
Eosinophils Absolute: 0.1 10*3/uL (ref 0.0–0.7)
HCT: 31.6 % — ABNORMAL LOW (ref 36.0–46.0)
HEMOGLOBIN: 9.5 g/dL — AB (ref 12.0–15.0)
Immature Granulocytes: 1 %
LYMPHS PCT: 5 %
Lymphs Abs: 1.1 10*3/uL (ref 0.7–4.0)
MCH: 26.9 pg (ref 26.0–34.0)
MCHC: 30.1 g/dL (ref 30.0–36.0)
MCV: 89.5 fL (ref 78.0–100.0)
MONO ABS: 1 10*3/uL (ref 0.1–1.0)
Monocytes Relative: 5 %
Neutro Abs: 19.8 10*3/uL — ABNORMAL HIGH (ref 1.7–7.7)
Neutrophils Relative %: 89 %
Platelets: 295 10*3/uL (ref 150–400)
RBC: 3.53 MIL/uL — ABNORMAL LOW (ref 3.87–5.11)
RDW: 18.8 % — ABNORMAL HIGH (ref 11.5–15.5)
WBC: 22.2 10*3/uL — ABNORMAL HIGH (ref 4.0–10.5)

## 2017-09-25 LAB — BRAIN NATRIURETIC PEPTIDE: B NATRIURETIC PEPTIDE 5: 2183.6 pg/mL — AB (ref 0.0–100.0)

## 2017-09-25 LAB — I-STAT CG4 LACTIC ACID, ED: LACTIC ACID, VENOUS: 1.67 mmol/L (ref 0.5–1.9)

## 2017-09-25 MED ORDER — FLUTICASONE FUROATE-VILANTEROL 100-25 MCG/INH IN AEPB
1.0000 | INHALATION_SPRAY | Freq: Every day | RESPIRATORY_TRACT | Status: DC
  Administered 2017-09-27 – 2017-10-03 (×6): 1 via RESPIRATORY_TRACT
  Filled 2017-09-25: qty 28

## 2017-09-25 MED ORDER — INSULIN ASPART 100 UNIT/ML ~~LOC~~ SOLN
0.0000 [IU] | Freq: Three times a day (TID) | SUBCUTANEOUS | Status: DC
  Administered 2017-09-26: 2 [IU] via SUBCUTANEOUS
  Administered 2017-09-26 – 2017-10-02 (×7): 1 [IU] via SUBCUTANEOUS

## 2017-09-25 MED ORDER — ALBUTEROL SULFATE (2.5 MG/3ML) 0.083% IN NEBU
2.5000 mg | INHALATION_SOLUTION | Freq: Four times a day (QID) | RESPIRATORY_TRACT | Status: DC | PRN
  Administered 2017-09-26: 2.5 mg via RESPIRATORY_TRACT

## 2017-09-25 MED ORDER — TRAMADOL HCL 50 MG PO TABS
50.0000 mg | ORAL_TABLET | Freq: Three times a day (TID) | ORAL | Status: DC | PRN
  Administered 2017-09-26 – 2017-10-03 (×14): 50 mg via ORAL
  Filled 2017-09-25 (×12): qty 1

## 2017-09-25 MED ORDER — PIPERACILLIN-TAZOBACTAM 3.375 G IVPB
3.3750 g | Freq: Two times a day (BID) | INTRAVENOUS | Status: DC
  Administered 2017-09-26: 3.375 g via INTRAVENOUS
  Filled 2017-09-25 (×2): qty 50

## 2017-09-25 MED ORDER — METOPROLOL TARTRATE 12.5 MG HALF TABLET
12.5000 mg | ORAL_TABLET | Freq: Every day | ORAL | Status: DC
  Administered 2017-09-26 – 2017-10-03 (×6): 12.5 mg via ORAL
  Filled 2017-09-25 (×7): qty 1

## 2017-09-25 MED ORDER — HEPARIN SODIUM (PORCINE) 5000 UNIT/ML IJ SOLN
5000.0000 [IU] | Freq: Three times a day (TID) | INTRAMUSCULAR | Status: DC
  Administered 2017-09-25 – 2017-10-03 (×21): 5000 [IU] via SUBCUTANEOUS
  Filled 2017-09-25 (×21): qty 1

## 2017-09-25 MED ORDER — NEPRO/CARBSTEADY PO LIQD
237.0000 mL | Freq: Two times a day (BID) | ORAL | Status: DC
Start: 2017-09-26 — End: 2017-09-28
  Administered 2017-09-26 – 2017-09-27 (×3): 237 mL via ORAL
  Administered 2017-09-28: 120 mL via ORAL
  Filled 2017-09-25 (×6): qty 237

## 2017-09-25 MED ORDER — DOXERCALCIFEROL 2.5 MCG PO CAPS
2.5000 ug | ORAL_CAPSULE | ORAL | Status: DC
  Administered 2017-09-30: 2.5 ug via ORAL
  Filled 2017-09-25 (×4): qty 1

## 2017-09-25 MED ORDER — ASPIRIN EC 81 MG PO TBEC
81.0000 mg | DELAYED_RELEASE_TABLET | Freq: Every day | ORAL | Status: DC
  Administered 2017-09-26 – 2017-10-03 (×6): 81 mg via ORAL
  Filled 2017-09-25 (×6): qty 1

## 2017-09-25 MED ORDER — LISINOPRIL 40 MG PO TABS
40.0000 mg | ORAL_TABLET | Freq: Every evening | ORAL | Status: DC
  Administered 2017-09-26 – 2017-09-27 (×2): 40 mg via ORAL
  Filled 2017-09-25: qty 2
  Filled 2017-09-25: qty 1

## 2017-09-25 MED ORDER — PRAVASTATIN SODIUM 40 MG PO TABS
40.0000 mg | ORAL_TABLET | Freq: Every day | ORAL | Status: DC
  Administered 2017-09-26 – 2017-10-03 (×5): 40 mg via ORAL
  Filled 2017-09-25 (×6): qty 1

## 2017-09-25 MED ORDER — NIFEDIPINE ER OSMOTIC RELEASE 30 MG PO TB24
60.0000 mg | ORAL_TABLET | Freq: Two times a day (BID) | ORAL | Status: DC
  Administered 2017-09-26 – 2017-10-03 (×12): 60 mg via ORAL
  Filled 2017-09-25: qty 1
  Filled 2017-09-25 (×12): qty 2
  Filled 2017-09-25: qty 1

## 2017-09-25 MED ORDER — IPRATROPIUM-ALBUTEROL 0.5-2.5 (3) MG/3ML IN SOLN
3.0000 mL | Freq: Once | RESPIRATORY_TRACT | Status: AC
  Administered 2017-09-25: 3 mL via RESPIRATORY_TRACT
  Filled 2017-09-25: qty 3

## 2017-09-25 MED ORDER — PIPERACILLIN-TAZOBACTAM 3.375 G IVPB 30 MIN
3.3750 g | Freq: Once | INTRAVENOUS | Status: AC
  Administered 2017-09-25: 3.375 g via INTRAVENOUS
  Filled 2017-09-25: qty 50

## 2017-09-25 MED ORDER — ACETAMINOPHEN 650 MG RE SUPP
650.0000 mg | Freq: Once | RECTAL | Status: AC
  Administered 2017-09-25: 650 mg via RECTAL
  Filled 2017-09-25: qty 1

## 2017-09-25 MED ORDER — ACETAMINOPHEN 10 MG/ML IV SOLN
1000.0000 mg | Freq: Four times a day (QID) | INTRAVENOUS | Status: AC
  Administered 2017-09-26 – 2017-09-27 (×4): 1000 mg via INTRAVENOUS
  Filled 2017-09-25 (×6): qty 100

## 2017-09-25 MED ORDER — METOPROLOL TARTRATE 5 MG/5ML IV SOLN
5.0000 mg | INTRAVENOUS | Status: DC | PRN
  Administered 2017-09-25: 5 mg via INTRAVENOUS
  Filled 2017-09-25: qty 5

## 2017-09-25 MED ORDER — VANCOMYCIN HCL 10 G IV SOLR
1500.0000 mg | Freq: Once | INTRAVENOUS | Status: AC
  Administered 2017-09-25: 1500 mg via INTRAVENOUS
  Filled 2017-09-25: qty 1500

## 2017-09-25 MED ORDER — UMECLIDINIUM BROMIDE 62.5 MCG/INH IN AEPB
1.0000 | INHALATION_SPRAY | Freq: Every day | RESPIRATORY_TRACT | Status: DC
  Administered 2017-09-27 – 2017-10-03 (×6): 1 via RESPIRATORY_TRACT
  Filled 2017-09-25: qty 7

## 2017-09-25 MED ORDER — ISOSORBIDE MONONITRATE ER 30 MG PO TB24
30.0000 mg | ORAL_TABLET | Freq: Every evening | ORAL | Status: DC
  Administered 2017-09-26 – 2017-10-02 (×6): 30 mg via ORAL
  Filled 2017-09-25 (×6): qty 1

## 2017-09-25 NOTE — ED Triage Notes (Signed)
[facility]/o shortness of breath. Pt was responsive to verbal stimuli initially upon EMS arrival. Now pt is alert and oriented to herself. Given 500 NS bolus. Facility gave "a breathing treatment" per EMS. Denies chest pain, nausea, abdominal pain.

## 2017-09-25 NOTE — Progress Notes (Signed)
Pharmacy Antibiotic Note  [name] is a 84 y.o. female admitted on 09/25/2017 with sepsis.  Pharmacy has been consulted for vancomycin and zosyn dosing.  Patient presenting with SOB, febrile (Tmax 101.1) and AMS. CXR at OSH showing PNA - recently treated with 7 days of ceftriaxone. LA 1.67 at admission. Known ESRD patient.   Plan: Order 1500 mg IV once vancomycin  Will schedule maintenance dose of 750 mg IV with hemodialysis once schedule determined Order zosyn 3.375 g IV every 12 hours Monitor HD schedule, clinical pic, cx results, and VT as indicated  Height: 5' 2.99" (160 cm) Weight: 136 lb (61.7 kg) IBW/kg (Calculated) : 52.38  Temp (24hrs), Avg:100.7 F (38.2 C), Min:100.2 F (37.9 C), Max:101.1 F (38.4 C)  Recent Labs  Lab 09/25/17 1847  LATICACIDVEN 1.67    CrCl cannot be calculated (Patient's most recent lab result is older than the maximum 21 days allowed.).    No Known Allergies  Antimicrobials this admission: Vancomycin 7/13 >>  Zosyn 7/13 >>   Dose adjustments this admission: N/A  Microbiology results: 7/13 BCx: sent 7/13 UCx: sent   Thank you for allowing pharmacy to be a part of this patient's care.  Girard CooterKimberly Perkins, PharmD Clinical Pharmacist  Pager: [phone] Phone: [phone] 09/25/2017 7:08 PM

## 2017-09-25 NOTE — H&P (Signed)
History and Physical    Nicole Cox UJW:[id number] DOB: 1931-06-24 DOA: 09/25/2017  PCP: Joana Reamer, MD  Patient coming from: SNF  I have personally briefly reviewed patient's old medical records in [facility]  Chief Complaint: AMS  HPI: Nicole Cox is a 84 y.o. female with medical history significant of congestive heart failure, coronary artery disease, COPD, diabetes, hypertension, end-stage renal disease on dialysis, CVA, presents to emergency department with altered level of consciousness and shortness of breath.  Patient is from nursing home facility.  Patient apparently has been sick for a week.  She had an outpatient x-ray which showed pneumonia.  She has been treated with Rocephin for 7 days now.  Today she went to dialysis and when she got back, patient seemed to be more disoriented than at baseline, not responding as she normally would.  Patient was transferred here for further evaluation.  Patient unable to provide much history but states she is not hurting anywhere.   ED Course: CXR calling CHF, bibasilar atelectasis.  Patient with rales and O2 requirement via [place].  Tm 101.x  WBC 22.2k.  She is awake and talking, but disoriented / confused.   Review of Systems: Unable to perform, AMS  Past Medical History:  Diagnosis Date  . CHF (congestive heart failure) (HCC)   . COPD (chronic obstructive pulmonary disease) (HCC)   . Coronary artery disease   . Diabetes mellitus without complication (HCC)   . Heart disease   . Hypertension   . Renal disorder   . Stroke [facility])     Past Surgical History:  Procedure Laterality Date  . ABDOMINAL HYSTERECTOMY    . CHOLECYSTECTOMY    . TEE WITHOUT CARDIOVERSION N/A 08/02/2017   Procedure: TRANSESOPHAGEAL ECHOCARDIOGRAM (TEE);  Surgeon: Lars Masson, MD;  Location: [facility] ENDOSCOPY;  Service: Cardiovascular;  Laterality: N/A;  . TONSILLECTOMY       reports that she has quit smoking. She has never used smokeless tobacco.  She reports that she does not drink alcohol or use drugs.  No Known Allergies  Family History  Problem Relation Age of Onset  . Heart disease Mother   . Hypertension Mother   . Heart disease Father   . Hypertension Father   . Heart disease Sister   . Diabetes Sister   . Stroke Sister   . Kidney disease Sister   . Hypertension Sister   . Heart disease Sister   . Kidney disease Sister   . Hypertension Sister   . Kidney disease Daughter   . Hypertension Daughter      Prior to Admission medications   Medication Sig Start Date End Date Taking? Authorizing Provider  albuterol (PROVENTIL HFA;VENTOLIN HFA) 108 (90 Base) MCG/ACT inhaler Inhale 2 puffs into the lungs every 6 (six) hours as needed for wheezing or shortness of breath. 08/17/15  Yes Mikhail, Nita Sells, DO  aspirin EC 81 MG tablet Take 81 mg by mouth daily.   Yes [provider]  cloNIDine (CATAPRES - DOSED IN MG/24 HR) 0.2 mg/24hr patch Place 1 patch (0.2 mg total) onto the skin every 7 (seven) days. 08/04/17  Yes Osvaldo Shipper, MD  diphenhydrAMINE (BENADRYL) 25 mg capsule Take 25 mg by mouth every morning.   Yes [provider]  doxercalciferol (HECTOROL) 2.5 MCG capsule Take 1 capsule (2.5 mcg total) by mouth Every Tuesday,Thursday,and Saturday with dialysis. 08/03/17  Yes Osvaldo Shipper, MD  fluticasone furoate-vilanterol (BREO ELLIPTA) 100-25 MCG/INH AEPB Inhale 1 puff into the lungs  daily. 11/10/15  Yes [provider]  insulin detemir (LEVEMIR) 100 UNIT/ML injection Inject 5 Units into the skin daily.   Yes [provider]  isosorbide mononitrate (IMDUR) 30 MG 24 hr tablet Take 30 mg by mouth every evening. 03/21/17  Yes [provider]  lidocaine-prilocaine (EMLA) cream Apply 1 application topically every other day. 10/21/15  Yes [provider]  lisinopril (PRINIVIL,ZESTRIL) 40 MG tablet Take 40 mg by mouth every evening. 12/02/15  Yes [provider]  Melatonin 3 MG  TABS Take 3 mg by mouth at bedtime.   Yes [provider]  metoprolol tartrate (LOPRESSOR) 25 MG tablet Take 0.5 tablets (12.5 mg total) by mouth 2 (two) times daily. Patient taking differently: Take 12.5 mg by mouth daily.  06/10/17  Yes Regalado, Belkys A, MD  NIFEdipine (PROCARDIA XL/ADALAT-CC) 60 MG 24 hr tablet Take 60 mg by mouth 2 (two) times daily. 12/03/15  Yes [provider]  nitroGLYCERIN (NITROSTAT) 0.4 MG SL tablet Place 0.4 mg under the tongue See admin instructions. Mat repeat every 5 minutes. If third tab needed call 911 06/23/17  Yes [provider]  Nutritional Supplements (FEEDING SUPPLEMENT, NEPRO CARB STEADY,) LIQD Take 237 mLs by mouth 2 (two) times daily between meals. 06/09/17  Yes Regalado, Belkys A, MD  pravastatin (PRAVACHOL) 40 MG tablet Take 40 mg by mouth daily.   Yes [provider]  traMADol (ULTRAM) 50 MG tablet Take 1 tablet (50 mg total) by mouth every 8 (eight) hours as needed. Patient taking differently: Take 50 mg by mouth every 8 (eight) hours as needed for moderate pain.  08/03/17  Yes Osvaldo ShipperKrishnan, Gokul, MD  umeclidinium bromide (INCRUSE ELLIPTA) 62.5 MCG/INH AEPB Inhale 1 puff into the lungs daily.   Yes [provider]    Physical Exam: Vitals:   09/25/17 2012 09/25/17 2015 09/25/17 2030 09/25/17 2045  BP:  (!) 154/55 (!) 149/78 (!) 174/100  Pulse:  93 94 95  Resp:  (!) 21 (!) 21 20  Temp: 99.3 F (37.4 C)     TempSrc: Oral     SpO2:  94% 93% 93%  Weight:      Height:        Constitutional: NAD, calm, comfortable Eyes: PERRL, lids and conjunctivae normal ENMT: Mucous membranes are moist. Posterior pharynx clear of any exudate or lesions.Normal dentition.  Neck: normal, supple, no masses, no thyromegaly Respiratory: B Rales, no accessory muscle use nor respiratory distress Cardiovascular: Regular rate and rhythm, no murmurs / rubs / gallops. No extremity edema. 2+ pedal pulses. No carotid bruits. AV graft  in L arm Abdomen: no tenderness, no masses palpated. No hepatosplenomegaly. Bowel sounds positive.  Musculoskeletal: no clubbing / cyanosis. No joint deformity upper and lower extremities. Good ROM, no contractures. Normal muscle tone.  Skin: no rashes, lesions, ulcers. No induration Neurologic: Speech slurred, MAE, generally weak. Psychiatric: Confused, oriented to self only   Labs on Admission: I have personally reviewed following labs and imaging studies  CBC: Recent Labs  Lab 09/25/17 1841  WBC 22.2*  NEUTROABS 19.8*  HGB 9.5*  HCT 31.6*  MCV 89.5  PLT 295   Basic Metabolic Panel: Recent Labs  Lab 09/25/17 1841  NA 138  K 5.0  CL 102  CO2 25  GLUCOSE 86  BUN 14  CREATININE 3.83*  CALCIUM 7.6*   GFR: Estimated Creatinine Clearance: 9.2 mL/min (A) (by C-G formula based on SCr of 3.83 mg/dL (H)). Liver Function Tests: Recent Labs  Lab 09/25/17 1841  AST 111*  ALT 45*  ALKPHOS 106  BILITOT 1.2  PROT 6.5  ALBUMIN 2.3*   No results for input(s): LIPASE, AMYLASE in the last 168 hours. No results for input(s): AMMONIA in the last 168 hours. Coagulation Profile: No results for input(s): INR, PROTIME in the last 168 hours. Cardiac Enzymes: No results for input(s): CKTOTAL, CKMB, CKMBINDEX, TROPONINI in the last 168 hours. BNP (last 3 results) No results for input(s): PROBNP in the last 8760 hours. HbA1C: No results for input(s): HGBA1C in the last 72 hours. CBG: No results for input(s): GLUCAP in the last 168 hours. Lipid Profile: No results for input(s): CHOL, HDL, LDLCALC, TRIG, CHOLHDL, LDLDIRECT in the last 72 hours. Thyroid Function Tests: No results for input(s): TSH, T4TOTAL, FREET4, T3FREE, THYROIDAB in the last 72 hours. Anemia Panel: No results for input(s): VITAMINB12, FOLATE, FERRITIN, TIBC, IRON, RETICCTPCT in the last 72 hours. Urine analysis:    Component Value Date/Time   COLORURINE YELLOW 08/11/2012 1917   APPEARANCEUR CLOUDY (A)  08/11/2012 1917   LABSPEC 1.013 08/11/2012 1917   PHURINE 7.5 08/11/2012 1917   GLUCOSEU 100 (A) 08/11/2012 1917   HGBUR TRACE (A) 08/11/2012 1917   BILIRUBINUR NEGATIVE 08/11/2012 1917   KETONESUR NEGATIVE 08/11/2012 1917   PROTEINUR >300 (A) 08/11/2012 1917   UROBILINOGEN 0.2 08/11/2012 1917   NITRITE NEGATIVE 08/11/2012 1917   LEUKOCYTESUR NEGATIVE 08/11/2012 1917    Radiological Exams on Admission: Dg Chest Portable 1 View  Result Date: 09/25/2017 CLINICAL DATA:  Shortness of breath. EXAM: PORTABLE CHEST 1 VIEW COMPARISON:  07/27/2017 FINDINGS: Midline trachea. Moderate cardiomegaly, accentuated by low lung volumes and AP portable technique. Atherosclerosis in the transverse aorta. No definite pleural fluid. The Chin overlies the apices minimally. No pneumothorax. Moderate pulmonary interstitial prominence and indistinctness, similar. Patchy bibasilar airspace disease is not significantly changed. More focal increased density projecting over the right lung base likely corresponds to a calcified pleural plaque when correlated with the lateral radiograph of 08/14/2012. IMPRESSION: 1. No change in moderate congestive heart failure. 2. Similar bibasilar Airspace disease, likely atelectasis. 3. Calcified right pleural plaque, possibly related to prior empyema or hemothorax. 4.  Aortic Atherosclerosis (ICD10-I70.0). Electronically Signed   By: Jeronimo Greaves M.D.   On: 09/25/2017 19:01    EKG: Independently reviewed.  Assessment/Plan Principal Problem:   SIRS (systemic inflammatory response syndrome) (HCC) Active Problems:   Hypertension   DM (diabetes mellitus), type 2 with renal complications (HCC)   COPD (chronic obstructive pulmonary disease) (HCC)   Acute respiratory failure with hypoxia (HCC)   ESRD on hemodialysis (HCC)   Delirium    1. SIRS - possibly HCAP, noted to have new O2 requirement as well 1. Continue empiric zosyn / vanc 2. PNA pathway 3. Cultures pending 2. HCAP vs  pulmonary edema on CXR - 1. With audible bibasilar rales, this despite dialysis earlier today 2. BNP ordered and pending 3. Thankfully no tachycardia, and patient actually hypertensive so no indications for IVF bolus at this point 4. Tele monitor 3. ESRD - 1. Call nephrology in AM for evaluation, just had dialysis today though. 4. HTN - 1. Hypertensive at this point 2. Will leave patient on home BP meds 1. Note: Clonidine patch not on the med-rec from SNF and I dont see one on the patient so not ordering this one.  Watch for evidence of rebound HTN though. 5. DM2 - 1. Sensitive SSI AC 6. COPD - continue home nebs  DVT  prophylaxis: Heparin [place] Code Status: Full Family Communication: No family in room Disposition Plan: SNF after admit Consults called: None Admission status: Admit to inpatient   Hillary Bow DO [facility] Pager [phone] Only works nights!  If 7AM-7PM, please contact the primary day team physician taking care of patient  [email or web address] Password [facility]  09/25/2017, 9:21 PM

## 2017-09-25 NOTE — ED Provider Notes (Signed)
[facility] EMERGENCY DEPARTMENT Provider Note   CSN: [id number] Arrival date & time: 09/25/17  1805     History   Chief Complaint Chief Complaint  Patient presents with  . Shortness of Breath    HPI Nicole Cox is a 84 y.o. female.  HPI  Nicole Cox is a 84 y.o. female with history of congestive heart failure, coronary artery disease, COPD, diabetes, hypertension, end-stage renal disease on dialysis, CVA, presents to emergency department with altered level of consciousness and shortness of breath.  Patient is from nursing home facility.  Patient apparently has been sick for a week.  She had an outpatient x-ray which showed pneumonia.  She has been treated with Rocephin for 7 days now.  Today she went to dialysis and when she got back, patient seemed to be more disoriented than at baseline, not responding as she normally would.  Patient was transferred here for further evaluation.  Patient unable to provide much history but states she is not hurting anywhere.   Past Medical History:  Diagnosis Date  . CHF (congestive heart failure) (HCC)   . COPD (chronic obstructive pulmonary disease) (HCC)   . Coronary artery disease   . Diabetes mellitus without complication (HCC)   . Heart disease   . Hypertension   . Renal disorder   . Stroke [facility])     Patient Active Problem List   Diagnosis Date Noted  . Bacteremia   . SIRS (systemic inflammatory response syndrome) (HCC) 07/27/2017  . Malnutrition of moderate degree 06/10/2017  . Pressure injury of skin 06/08/2017  . Acute pulmonary edema (HCC) 06/07/2017  . Respiratory failure with hypoxia (HCC) 12/12/2015  . Acute on chronic respiratory failure with hypoxia (HCC) 12/12/2015  . Fluid overload 12/12/2015  . Palliative care encounter   . Acute respiratory failure with hypoxia (HCC) 08/13/2015  . ESRD on hemodialysis (HCC) 08/13/2015  . Hypertensive urgency 08/13/2015  . Chronic anemia 08/13/2015  . Acute  respiratory failure (HCC) 08/13/2015  . HCAP (healthcare-associated pneumonia) 08/11/2012  . Hypoxia 08/11/2012  . ESRD (end stage renal disease) (HCC) 08/11/2012  . Hypertension 08/11/2012  . DM (diabetes mellitus), type 2 with renal complications (HCC) 08/11/2012  . C1 cervical fracture (HCC) 08/11/2012  . Abnormal MRI, thoracic spine 08/11/2012  . COPD (chronic obstructive pulmonary disease) (HCC) 08/11/2012  . CAD (coronary artery disease) 08/11/2012  . Fall 08/11/2012    Past Surgical History:  Procedure Laterality Date  . ABDOMINAL HYSTERECTOMY    . CHOLECYSTECTOMY    . TEE WITHOUT CARDIOVERSION N/A 08/02/2017   Procedure: TRANSESOPHAGEAL ECHOCARDIOGRAM (TEE);  Surgeon: Lars Masson, MD;  Location: [facility] ENDOSCOPY;  Service: Cardiovascular;  Laterality: N/A;  . TONSILLECTOMY       OB History   None      Home Medications    Prior to Admission medications   Medication Sig Start Date End Date Taking? Authorizing Provider  albuterol (PROVENTIL HFA;VENTOLIN HFA) 108 (90 Base) MCG/ACT inhaler Inhale 2 puffs into the lungs every 6 (six) hours as needed for wheezing or shortness of breath. 08/17/15   Edsel Petrin, DO  aspirin EC 81 MG tablet Take 81 mg by mouth daily.    [provider]  calcium acetate (PHOSLO) 667 MG capsule Take 2 capsules (1,334 mg total) by mouth 3 (three) times daily with meals. Patient taking differently: Take 667 mg by mouth 4 (four) times daily - after meals and at bedtime.  06/09/17   Regalado, Prentiss Bells, MD  cefTAZidime in dextrose 5 % 50 mL With hemodialysis until Aug 10, 2017 08/03/17   Osvaldo ShipperKrishnan, Gokul, MD  cloNIDine (CATAPRES - DOSED IN MG/24 HR) 0.2 mg/24hr patch Place 1 patch (0.2 mg total) onto the skin every 7 (seven) days. 08/04/17   Osvaldo ShipperKrishnan, Gokul, MD  diphenhydrAMINE (BENADRYL) 25 mg capsule Take 25 mg by mouth every morning.    [provider]  doxercalciferol (HECTOROL) 2.5 MCG capsule Take 1 capsule (2.5 mcg total) by  mouth Every Tuesday,Thursday,and Saturday with dialysis. 08/03/17   Osvaldo ShipperKrishnan, Gokul, MD  fluticasone furoate-vilanterol (BREO ELLIPTA) 100-25 MCG/INH AEPB Inhale 1 puff into the lungs daily. 11/10/15   [provider]  insulin glargine (LANTUS) 100 UNIT/ML injection Inject 0.05 mLs (5 Units total) into the skin at bedtime. Patient taking differently: Inject 16 Units into the skin at bedtime.  06/09/17   Regalado, Belkys A, MD  isosorbide mononitrate (IMDUR) 30 MG 24 hr tablet Take 30 mg by mouth every evening. 03/21/17   [provider]  lidocaine-prilocaine (EMLA) cream Apply 1 application topically every other day. 10/21/15   [provider]  lisinopril (PRINIVIL,ZESTRIL) 40 MG tablet Take 40 mg by mouth every evening. 12/02/15   [provider]  metoprolol tartrate (LOPRESSOR) 25 MG tablet Take 0.5 tablets (12.5 mg total) by mouth 2 (two) times daily. Patient taking differently: Take 12.5 mg by mouth daily.  06/10/17   Regalado, Belkys A, MD  NIFEdipine (PROCARDIA XL/ADALAT-CC) 60 MG 24 hr tablet Take 60 mg by mouth 2 (two) times daily. 12/03/15   [provider]  nitroGLYCERIN (NITROSTAT) 0.4 MG SL tablet Place 0.4 mg under the tongue See admin instructions. Mat repeat every 5 minutes. If third tab needed call 911 06/23/17   [provider]  Nutritional Supplements (FEEDING SUPPLEMENT, NEPRO CARB STEADY,) LIQD Take 237 mLs by mouth 2 (two) times daily between meals. 06/09/17   Regalado, Belkys A, MD  pravastatin (PRAVACHOL) 40 MG tablet Take 40 mg by mouth daily.    [provider]  tiotropium (SPIRIVA HANDIHALER) 18 MCG inhalation capsule Place 1 capsule into inhaler and inhale daily. 08/10/14   [provider]  traMADol (ULTRAM) 50 MG tablet Take 1 tablet (50 mg total) by mouth every 8 (eight) hours as needed. 08/03/17   Osvaldo ShipperKrishnan, Gokul, MD  traZODone (DESYREL) 100 MG tablet Take 0.5-1 tablets (50-100 mg total) by mouth at bedtime as  needed for sleep. 08/03/17   Osvaldo ShipperKrishnan, Gokul, MD  vancomycin in sodium chloride 0.9 % 100 mL With hemodialysis until Aug 10, 2017 08/03/17   Osvaldo ShipperKrishnan, Gokul, MD    Family History Family History  Problem Relation Age of Onset  . Heart disease Mother   . Hypertension Mother   . Heart disease Father   . Hypertension Father   . Heart disease Sister   . Diabetes Sister   . Stroke Sister   . Kidney disease Sister   . Hypertension Sister   . Heart disease Sister   . Kidney disease Sister   . Hypertension Sister   . Kidney disease Daughter   . Hypertension Daughter     Social History Social History   Tobacco Use  . Smoking status: Former Games developermoker  . Smokeless tobacco: Never Used  Substance Use Topics  . Alcohol use: No  . Drug use: No     Allergies   Patient has no known allergies.   Review of Systems Review of Systems  Unable to perform ROS: Mental status change  Physical Exam Updated Vital Signs BP (!) 144/49 (BP Location: Right Arm)   Pulse 89   Temp 100.2 F (37.9 C) (Oral)   Resp 18   SpO2 100%   Physical Exam  Constitutional: She appears well-developed and well-nourished.  HENT:  Head: Normocephalic.  Eyes: Conjunctivae are normal.  Neck: Neck supple.  Cardiovascular: Normal rate and regular rhythm.  Murmur heard. Pulmonary/Chest: She has no wheezes. She has rales.  Rales at bases bilaterally  Abdominal: Soft. Bowel sounds are normal. She exhibits no distension. There is no tenderness. There is no rebound.  Musculoskeletal: She exhibits no edema.  AV graft to the left upper arm  Neurological: She is alert.  Oriented to self only, slow to respond, speech is slurred.  Moving all extremities bilaterally.  Appears to be generally weak  Skin: Skin is warm and dry.  Stage II pressure ulcers to the sacrum.  No signs of infection  Psychiatric: She has a normal mood and affect. Her behavior is normal.  Nursing note and vitals reviewed.    ED Treatments  / Results  Labs (all labs ordered are listed, but only abnormal results are displayed) Labs Reviewed  CULTURE, BLOOD (ROUTINE X 2)  CULTURE, BLOOD (ROUTINE X 2)  URINE CULTURE  CBC WITH DIFFERENTIAL/PLATELET  COMPREHENSIVE METABOLIC PANEL  URINALYSIS, ROUTINE W REFLEX MICROSCOPIC  I-STAT CG4 LACTIC ACID, ED    EKG EKG Interpretation  Date/Time:  Saturday September 25 2017 18:25:22 EDT Ventricular Rate:  99 PR Interval:    QRS Duration: 87 QT Interval:  433 QTC Calculation: 556 R Axis:   96 Text Interpretation:  Age not entered, assumed to be  84 years old for purpose of ECG interpretation Sinus rhythm Borderline right axis deviation Repol abnrm suggests ischemia, diffuse leads ST-t wave abnormality Abnormal ekg Confirmed by Gerhard Munch [phone]) on 09/25/2017 7:19:06 PM   Radiology Dg Chest Portable 1 View  Result Date: 09/25/2017 CLINICAL DATA:  Shortness of breath. EXAM: PORTABLE CHEST 1 VIEW COMPARISON:  07/27/2017 FINDINGS: Midline trachea. Moderate cardiomegaly, accentuated by low lung volumes and AP portable technique. Atherosclerosis in the transverse aorta. No definite pleural fluid. The Chin overlies the apices minimally. No pneumothorax. Moderate pulmonary interstitial prominence and indistinctness, similar. Patchy bibasilar airspace disease is not significantly changed. More focal increased density projecting over the right lung base likely corresponds to a calcified pleural plaque when correlated with the lateral radiograph of 08/14/2012. IMPRESSION: 1. No change in moderate congestive heart failure. 2. Similar bibasilar Airspace disease, likely atelectasis. 3. Calcified right pleural plaque, possibly related to prior empyema or hemothorax. 4.  Aortic Atherosclerosis (ICD10-I70.0). Electronically Signed   By: Jeronimo Greaves M.D.   On: 09/25/2017 19:01    Procedures Procedures (including critical care time)  Medications Ordered in ED Medications  ipratropium-albuterol  (DUONEB) 0.5-2.5 (3) MG/3ML nebulizer solution 3 mL (has no administration in time range)     Initial Impression / Assessment and Plan / ED Course  I have reviewed the triage vital signs and the nursing notes.  Pertinent labs & imaging results that were available during my care of the patient were reviewed by me and considered in my medical decision making (see chart for details).     Patient is mildly tachycardic, heart rate is in the 90s.  Temperature 101.1 rectally.  Blood pressure is 158/105, respiratory rate and oxygen saturation normal at this time.  Patient with decreased level of consciousness.  Apparently has had pneumonia on the x-ray over the  last week, however not certain that this is the source of her infection given the patient denies any shortness of breath.  She does have rales on exam.  I will go ahead and start her on broad antibiotics, code sepsis was called.  We will hold off on IV fluids given that she is a dialysis patient and sounds wet, until we get a chest x-ray and lactic acid.  She received 500 cc of fluids at this time.   White blood cell count is 22.2, patient is receiving antibiotics.  Lactic acid is normal at 1.67.  Chest x-ray showing congestive heart failure, she does have rales on exam, I will not give her 30cc/kg of fluids.  She is more alert and speaking more than when she first got here.  Daughter is at bedside, updated with the plan of admission.  According to the daughter, patient is very altered off of her baseline.  She is normally able to communicate and alert and oriented x3.  Patient does not make urine,   Spoke with [facility], they will admit her.  Vitals:   09/25/17 2012 09/25/17 2015 09/25/17 2030 09/25/17 2045  BP:  (!) 154/55 (!) 149/78 (!) 174/100  Pulse:  93 94 95  Resp:  (!) 21 (!) 21 20  Temp: 99.3 F (37.4 C)     TempSrc: Oral     SpO2:  94% 93% 93%  Weight:      Height:        Final Clinical Impressions(s) / ED Diagnoses     Final diagnoses:  Sepsis, due to unspecified organism [facility])    ED Discharge Orders    None       Jaynie Crumble, PA-C 09/26/17 0035    Gerhard Munch, MD 09/26/17 2307

## 2017-09-25 NOTE — Significant Event (Addendum)
Rapid Response Event Note  Overview: Respiratory   Initial Focused Assessment: Called by RN about patient having hypoxia, oxygen saturations dropped into the 70s. Patient was on 2L [place]. RN placed patient on NRB 15L and saturations improved to 87%. RR in the mid 30s, + use of accessory muscles, lung sounds - rales/rhonchi throughout lung fields R > L, patient unable to follow commands and unable to cough up secretions. Skin very warm, temp of 102.SBP 190-220s, HR in the 120-130s. [facility] paged [facility] NP at 2243. Aspirating > ?. Alert to self, + slurred speech. Patient vomited as well.  Interventions: - NTS x 2 - STAT ABG - PaO2 53 ([facility] - Lopressor 5mg  IV x 1 - APAP x 1  Plan of Care: - Monitor respiratory status - NPO - Keep saturations 88-92% (History of COPD) - Aspiration Precautions - Low threshold for needing intubation (airway protection). With altered mental status, aspiration risk, and vomiting, BIPAP is not ideal. - NP called back, we discussed ABG and POC, will monitor patient for now, keep on NRB d/t low PaO2, SBP < 180, and treat fever. - I suggest [facility] at the time of call along with consulting PCCM  Event Summary:   at    Call Time 2324 Arrival Time 2327 End Time 0030  Frances Joynt R

## 2017-09-26 ENCOUNTER — Encounter (HOSPITAL_COMMUNITY): Payer: Self-pay

## 2017-09-26 ENCOUNTER — Inpatient Hospital Stay (HOSPITAL_COMMUNITY): Payer: Medicare HMO

## 2017-09-26 ENCOUNTER — Other Ambulatory Visit: Payer: Self-pay

## 2017-09-26 LAB — BLOOD GAS, ARTERIAL
Acid-Base Excess: 3.3 mmol/L — ABNORMAL HIGH (ref 0.0–2.0)
Bicarbonate: 27 mmol/L (ref 20.0–28.0)
Drawn by: 252031
FIO2: 100
O2 Saturation: 82.4 %
PH ART: 7.427 (ref 7.350–7.450)
Patient temperature: 102
pCO2 arterial: 42.7 mmHg (ref 32.0–48.0)
pO2, Arterial: 53.9 mmHg — ABNORMAL LOW (ref 83.0–108.0)

## 2017-09-26 LAB — BLOOD CULTURE ID PANEL (REFLEXED)
ACINETOBACTER BAUMANNII: NOT DETECTED
Acinetobacter baumannii: DETECTED — AB
CANDIDA ALBICANS: NOT DETECTED
CANDIDA GLABRATA: NOT DETECTED
CANDIDA KRUSEI: NOT DETECTED
CANDIDA PARAPSILOSIS: NOT DETECTED
CANDIDA PARAPSILOSIS: NOT DETECTED
Candida albicans: NOT DETECTED
Candida glabrata: NOT DETECTED
Candida krusei: NOT DETECTED
Candida tropicalis: NOT DETECTED
Candida tropicalis: NOT DETECTED
Carbapenem resistance: NOT DETECTED
ENTEROBACTER CLOACAE COMPLEX: NOT DETECTED
ENTEROBACTER CLOACAE COMPLEX: NOT DETECTED
ENTEROBACTERIACEAE SPECIES: NOT DETECTED
ESCHERICHIA COLI: NOT DETECTED
Enterobacteriaceae species: NOT DETECTED
Enterococcus species: NOT DETECTED
Enterococcus species: NOT DETECTED
Escherichia coli: NOT DETECTED
HAEMOPHILUS INFLUENZAE: NOT DETECTED
Haemophilus influenzae: NOT DETECTED
KLEBSIELLA OXYTOCA: NOT DETECTED
KLEBSIELLA PNEUMONIAE: NOT DETECTED
Klebsiella oxytoca: NOT DETECTED
Klebsiella pneumoniae: NOT DETECTED
LISTERIA MONOCYTOGENES: NOT DETECTED
Listeria monocytogenes: NOT DETECTED
Methicillin resistance: DETECTED — AB
Neisseria meningitidis: NOT DETECTED
Neisseria meningitidis: NOT DETECTED
PSEUDOMONAS AERUGINOSA: NOT DETECTED
PSEUDOMONAS AERUGINOSA: NOT DETECTED
Proteus species: NOT DETECTED
Proteus species: NOT DETECTED
SERRATIA MARCESCENS: NOT DETECTED
STREPTOCOCCUS AGALACTIAE: NOT DETECTED
STREPTOCOCCUS PNEUMONIAE: NOT DETECTED
STREPTOCOCCUS PNEUMONIAE: NOT DETECTED
STREPTOCOCCUS PYOGENES: NOT DETECTED
STREPTOCOCCUS PYOGENES: NOT DETECTED
STREPTOCOCCUS SPECIES: NOT DETECTED
Serratia marcescens: NOT DETECTED
Staphylococcus aureus (BCID): NOT DETECTED
Staphylococcus aureus (BCID): NOT DETECTED
Staphylococcus species: NOT DETECTED
Staphylococcus species: DETECTED — AB
Streptococcus agalactiae: NOT DETECTED
Streptococcus species: NOT DETECTED

## 2017-09-26 LAB — GLUCOSE, CAPILLARY
GLUCOSE-CAPILLARY: 145 mg/dL — AB (ref 70–99)
Glucose-Capillary: 67 mg/dL — ABNORMAL LOW (ref 70–99)
Glucose-Capillary: 148 mg/dL — ABNORMAL HIGH (ref 70–99)
Glucose-Capillary: 137 mg/dL — ABNORMAL HIGH (ref 70–99)
Glucose-Capillary: 163 mg/dL — ABNORMAL HIGH (ref 70–99)

## 2017-09-26 LAB — BASIC METABOLIC PANEL
ANION GAP: 10 (ref 5–15)
BUN: 18 mg/dL (ref 8–23)
CHLORIDE: 103 mmol/L (ref 98–111)
CO2: 28 mmol/L (ref 22–32)
CREATININE: 4.8 mg/dL — AB (ref 0.44–1.00)
Calcium: 8.3 mg/dL — ABNORMAL LOW (ref 8.9–10.3)
GFR calc non Af Amer: 8 mL/min — ABNORMAL LOW (ref >60)
GFR, EST AFRICAN AMERICAN: 9 mL/min — AB (ref >60)
GLUCOSE: 65 mg/dL — AB (ref 70–99)
Potassium: 4 mmol/L (ref 3.5–5.1)
Sodium: 141 mmol/L (ref 135–145)

## 2017-09-26 LAB — MRSA PCR SCREENING: MRSA by PCR: NEGATIVE

## 2017-09-26 LAB — CBC
HEMATOCRIT: 32.6 % — AB (ref 36.0–46.0)
Hemoglobin: 9.6 g/dL — ABNORMAL LOW (ref 12.0–15.0)
MCH: 26.3 pg (ref 26.0–34.0)
MCHC: 29.4 g/dL — AB (ref 30.0–36.0)
MCV: 89.3 fL (ref 78.0–100.0)
Platelets: 268 10*3/uL (ref 150–400)
RBC: 3.65 MIL/uL — ABNORMAL LOW (ref 3.87–5.11)
RDW: 18.6 % — ABNORMAL HIGH (ref 11.5–15.5)
WBC: 11.9 10*3/uL — ABNORMAL HIGH (ref 4.0–10.5)

## 2017-09-26 LAB — C DIFFICILE QUICK SCREEN W PCR REFLEX
C DIFFICLE (CDIFF) ANTIGEN: NEGATIVE
C Diff interpretation: NOT DETECTED
C Diff toxin: NEGATIVE

## 2017-09-26 LAB — HIV ANTIBODY (ROUTINE TESTING W REFLEX): HIV Screen 4th Generation wRfx: NONREACTIVE

## 2017-09-26 MED ORDER — CHLORHEXIDINE GLUCONATE CLOTH 2 % EX PADS
6.0000 | MEDICATED_PAD | Freq: Every day | CUTANEOUS | Status: DC
  Administered 2017-09-27 – 2017-09-29 (×2): 6 via TOPICAL

## 2017-09-26 MED ORDER — DEXTROSE 50 % IV SOLN
50.0000 mL | Freq: Once | INTRAVENOUS | Status: AC
  Administered 2017-09-26: 50 mL via INTRAVENOUS
  Filled 2017-09-26: qty 50

## 2017-09-26 MED ORDER — PENTAFLUOROPROP-TETRAFLUOROETH EX AERO: 1.0000 "application " | INHALATION_SPRAY | CUTANEOUS | Status: DC | PRN

## 2017-09-26 MED ORDER — TRAMADOL HCL 50 MG PO TABS
ORAL_TABLET | ORAL | Status: AC
  Filled 2017-09-26: qty 1

## 2017-09-26 MED ORDER — PNEUMOCOCCAL VAC POLYVALENT 25 MCG/0.5ML IJ INJ
0.5000 mL | INJECTION | INTRAMUSCULAR | Status: AC
  Administered 2017-09-27: 0.5 mL via INTRAMUSCULAR
  Filled 2017-09-26: qty 0.5

## 2017-09-26 MED ORDER — ORAL CARE MOUTH RINSE
15.0000 mL | Freq: Two times a day (BID) | OROMUCOSAL | Status: DC
  Administered 2017-09-26 – 2017-10-03 (×13): 15 mL via OROMUCOSAL

## 2017-09-26 MED ORDER — SODIUM CHLORIDE 0.9 % IV SOLN
500.0000 mg | INTRAVENOUS | Status: DC
  Administered 2017-09-26 – 2017-09-27 (×2): 500 mg via INTRAVENOUS
  Filled 2017-09-26 (×3): qty 0.5

## 2017-09-26 MED ORDER — SODIUM CHLORIDE 0.9 % IV SOLN: 100.0000 mL | INTRAVENOUS | Status: DC | PRN

## 2017-09-26 MED ORDER — LIDOCAINE-PRILOCAINE 2.5-2.5 % EX CREA: 1.0000 "application " | TOPICAL_CREAM | CUTANEOUS | Status: DC | PRN

## 2017-09-26 MED ORDER — HEPARIN SODIUM (PORCINE) 1000 UNIT/ML DIALYSIS
2000.0000 [IU] | Freq: Once | INTRAMUSCULAR | Status: AC
  Administered 2017-09-26: 2000 [IU] via INTRAVENOUS_CENTRAL

## 2017-09-26 NOTE — Procedures (Signed)
   I was present at this dialysis session, have reviewed the session itself and made  appropriate changes Vinson Moselleob Harrietta Incorvaia MD [facility] pager [phone]   09/26/2017, 10:59 PM

## 2017-09-26 NOTE — Progress Notes (Signed)
Pharmacy Antibiotic Note  Nicole Cox is a 84 y.o. female admitted on 09/25/2017 with sepsis. Patient has been on vancomycin and zosyn. Pharmacy has been consulted for switching to meropenem for acinetobacter baumannii bacteremia coverage.   Patient presenting with SOB, febrile (Tmax 101.1) and AMS. CXR at OSH showing PNA - recently treated with 7 days of ceftriaxone. LA 1.67 at admission. Past medical history includes CVA, DM, CAD, COPD, CHF, ESRD on HD TTS.   Plan: Discontinue Vancomycin and Zosyn  Start Meropenem 500 MG q24h (after HD on HD days) Monitor HD schedule, clinical status, and VT as indicated  Height: 5' 2.99" (160 cm) Weight: 136 lb (61.7 kg) IBW/kg (Calculated) : 52.38  Temp (24hrs), Avg:99.7 F (37.6 C), Min:98.4 F (36.9 C), Max:102 F (38.9 C)  Recent Labs  Lab 09/25/17 1841 09/25/17 1847 09/26/17 0304  WBC 22.2*  --  11.9*  CREATININE 3.83*  --  4.80*  LATICACIDVEN  --  1.67  --     Estimated Creatinine Clearance: 7.3 mL/min (A) (by C-G formula based on SCr of 4.8 mg/dL (H)).    No Known Allergies  Antimicrobials this admission: Vancomycin 7/13 >> 7/14 Zosyn 7/13 >> 7/14  Dose adjustments this admission: N/A  Microbiology results: 7/13 BCx: Acinetobacter baumannii  7/13 UCx: sent   Thank you for allowing pharmacy to be a part of this patient's care.  Gwynneth AlbrightSara Nimer, Ilda BassetPharm D PGY1 Pharmacy Resident  Phone [phone] 09/26/2017   2:12 PM

## 2017-09-26 NOTE — Progress Notes (Signed)
Attempted to wean pt off NRB to HFNC. Pt desat to 82% within minutes. Pt transitioned to 55% venti-mask and. sats 96%  Update 10AM  Pt awake, oriented to person and place. Remains on 55% venti mask, able to cough to clear secretions. Sats remain at 96% on venti-mask. RR 27. Pt able to drink water and take pills but declines food at this time. Nursing to continue to monitor.

## 2017-09-26 NOTE — Progress Notes (Addendum)
PROGRESS NOTE    [name]  ZOX:[id number] DOB: 1931-07-06 DOA: 09/25/2017 PCP: [name], J Stewart, MD  Brief Narrative: 84 y.o. female with medical history significant of congestive heart failure, coronary artery disease, COPD, diabetes, hypertension, end-stage renal disease on dialysis, CVA, presents to emergency department with altered level of consciousness and shortness of breath. Patient is from nursing home facility. Patient apparently has been sick for a week. She had an outpatient x-ray which showed pneumonia. She has been treated with Rocephin for 7 days now. Today she went to dialysis and when she got back, patient seemed to be more disoriented than at baseline, not responding as she normally would. Patient was transferred here for further evaluation. Patient unable to provide much history but states she is not hurting anywhere.   ED Course: CXR calling CHF, bibasilar atelectasis.  Patient with rales and O2 requirement via [place].  Tm 101.x  WBC 22.2k.  She is awake and talking, but disoriented / confused.     Assessment & Plan:   Principal Problem:   SIRS (systemic inflammatory response syndrome) (HCC) Active Problems:   Hypertension   DM (diabetes mellitus), type 2 with renal complications (HCC)   COPD (chronic obstructive pulmonary disease) (HCC)   Acute respiratory failure with hypoxia (HCC)   ESRD on hemodialysis (HCC)   Delirium 1] acute hypoxic respiratory failure secondary to combination of fluid overload plus healthcare associated pneumonia.  Patient spiked a temp of 102 overnight.  Currently being treated empirically with vancomycin and Zosyn.  Continue nebulizer.  Follow-up cultures.  Nephrology consulted for dialysis possibly today.  X-ray shows fluid overload.  She is on 15 L of nonrebreather mask satting at 96%.  Appears comfortable.  She was hard to arouse in the beginning.  It appeared that she was tired and sleeping.  ABG from midnight noted.  Titrate  oxygen as needed.  Patient had dialysis yesterday.  Follow-up chest x-ray.  Leukocytosis is improved from 22 to 11.  2] ESRD on dialysis Tuesday Thursday Saturdays.  Consulted nephrologist today.  3] hypertension blood pressure was very elevated upon admission.  Now it is come down to low to low normal.  She has received 1 dose of beta-blocker overnight.  4] type 2 diabetes continue sliding scale insulin until she is stabilized.  5] history of COPD continue home medications.  6]?PAD-check arterial doppler.  Patient has decreased pulses in both lower extremities.     DVT prophylaxis: Heparin Code Status: Full code Family Communication: No family available Disposition Plan:   Consultants: renal  Procedures:none Antimicrobials:none Subjective: Patient resting in bed sleeping did not wake up to my me calling her name.  But when I pinch her chest she woke up and said to leave  Her alone.   Objective: Vitals:   09/26/17 0600 09/26/17 0700 09/26/17 0750 09/26/17 0833  BP: (!) 116/35 (!) 123/33 (!) 124/35   Pulse: 93 90 88   Resp: (!) 27 (!) 24 (!) 28   Temp:   98.4 F (36.9 C)   TempSrc:   Oral   SpO2: 100% 97% 99% 96%  Weight:      Height:        Intake/Output Summary (Last 24 hours) at 09/26/2017 0958 Last data filed at 09/26/2017 0800 Gross per 24 hour  Intake 250 ml  Output -  Net 250 ml   Filed Weights   09/25/17 1904  Weight: 61.7 kg (136 lb)    Examination:  General exam: Appears calm and  comfortable  Respiratory system: Coarse b/s  auscultation. Respiratory effort normal. Cardiovascular system: S1 & S2 heard, RRR. No JVD, murmurs, rubs, gallops or clicks. No pedal edema. Gastrointestinal system: Abdomen is nondistended, soft and nontender. No organomegaly or masses felt. Normal bowel sounds heard. Central nervous system: Alert and oriented. No focal neurological deficits. Extremities:trace edema b/l  Skin: No rashes, lesions or ulcers Psychiatry: Judgement  and insight appear normal. Mood & affect appropriate.     Data Reviewed: I have personally reviewed following labs and imaging studies  CBC: Recent Labs  Lab 09/25/17 1841 09/26/17 0304  WBC 22.2* 11.9*  NEUTROABS 19.8*  --   HGB 9.5* 9.6*  HCT 31.6* 32.6*  MCV 89.5 89.3  PLT 295 268   Basic Metabolic Panel: Recent Labs  Lab 09/25/17 1841 09/26/17 0304  NA 138 141  K 5.0 4.0  CL 102 103  CO2 25 28  GLUCOSE 86 65*  BUN 14 18  CREATININE 3.83* 4.80*  CALCIUM 7.6* 8.3*   GFR: Estimated Creatinine Clearance: 7.3 mL/min (A) (by C-G formula based on SCr of 4.8 mg/dL (H)). Liver Function Tests: Recent Labs  Lab 09/25/17 1841  AST 111*  ALT 45*  ALKPHOS 106  BILITOT 1.2  PROT 6.5  ALBUMIN 2.3*   No results for input(s): LIPASE, AMYLASE in the last 168 hours. No results for input(s): AMMONIA in the last 168 hours. Coagulation Profile: No results for input(s): INR, PROTIME in the last 168 hours. Cardiac Enzymes: No results for input(s): CKTOTAL, CKMB, CKMBINDEX, TROPONINI in the last 168 hours. BNP (last 3 results) No results for input(s): PROBNP in the last 8760 hours. HbA1C: No results for input(s): HGBA1C in the last 72 hours. CBG: Recent Labs  Lab 09/26/17 0747 09/26/17 0849  GLUCAP 67* 148*   Lipid Profile: No results for input(s): CHOL, HDL, LDLCALC, TRIG, CHOLHDL, LDLDIRECT in the last 72 hours. Thyroid Function Tests: No results for input(s): TSH, T4TOTAL, FREET4, T3FREE, THYROIDAB in the last 72 hours. Anemia Panel: No results for input(s): VITAMINB12, FOLATE, FERRITIN, TIBC, IRON, RETICCTPCT in the last 72 hours. Sepsis Labs: Recent Labs  Lab 09/25/17 1847  LATICACIDVEN 1.67    Recent Results (from the past 240 hour(s))  Blood culture (routine x 2)     Status: None (Preliminary result)   Collection Time: 09/25/17  6:57 PM  Result Value Ref Range Status   Specimen Description BLOOD RIGHT WRIST  Final   Special Requests   Final    BOTTLES  DRAWN AEROBIC AND ANAEROBIC Blood Culture adequate volume   Culture  Setup Time   Final    GRAM NEGATIVE RODS AEROBIC BOTTLE ONLY Organism ID to follow Performed at [facility], [address] [place], [place] [address]    Culture GRAM NEGATIVE RODS  Final   Report Status PENDING  Incomplete  MRSA PCR Screening     Status: None   Collection Time: 09/26/17 12:53 AM  Result Value Ref Range Status   MRSA by PCR NEGATIVE NEGATIVE Final    Comment:        The GeneXpert MRSA Assay (FDA approved for NASAL specimens only), is one component of a comprehensive MRSA colonization surveillance program. It is not intended to diagnose MRSA infection nor to guide or monitor treatment for MRSA infections. Performed at [facility], [address], [place], [place] [address]          Radiology Studies: Dg Chest Portable 1 View  Result Date:  09/25/2017 CLINICAL DATA:  Shortness of breath. EXAM: PORTABLE CHEST 1 VIEW COMPARISON:  07/27/2017 FINDINGS: Midline trachea. Moderate cardiomegaly, accentuated by low lung volumes and AP portable technique. Atherosclerosis in the transverse aorta. No definite pleural fluid. The Chin overlies the apices minimally. No pneumothorax. Moderate pulmonary interstitial prominence and indistinctness, similar. Patchy bibasilar airspace disease is not significantly changed. More focal increased density projecting over the right lung base likely corresponds to a calcified pleural plaque when correlated with the lateral radiograph of 08/14/2012. IMPRESSION: 1. No change in moderate congestive heart failure. 2. Similar bibasilar Airspace disease, likely atelectasis. 3. Calcified right pleural plaque, possibly related to prior empyema or hemothorax. 4.  Aortic Atherosclerosis (ICD10-I70.0). Electronically Signed   By: Jeronimo Greaves M.D.   On: 09/25/2017 19:01        Scheduled Meds: . aspirin EC  81 mg Oral Daily  . [START ON 09/28/2017] doxercalciferol   2.5 mcg Oral Q T,Th,Sa-HD  . feeding supplement (NEPRO CARB STEADY)  237 mL Oral BID BM  . fluticasone furoate-vilanterol  1 puff Inhalation Daily  . heparin  5,000 Units Subcutaneous Q8H  . insulin aspart  0-9 Units Subcutaneous TID WC  . isosorbide mononitrate  30 mg Oral QPM  . lisinopril  40 mg Oral QPM  . mouth rinse  15 mL Mouth Rinse BID  . metoprolol tartrate  12.5 mg Oral Daily  . NIFEdipine  60 mg Oral BID  . pravastatin  40 mg Oral Daily  . umeclidinium bromide  1 puff Inhalation Daily   Continuous Infusions: . acetaminophen Stopped (09/26/17 0640)  . piperacillin-tazobactam (ZOSYN)  IV 3.375 g (09/26/17 4098)     LOS: 1 day     Alwyn Ren, MD [facility]  If 7PM-7AM, please contact night-coverage [email or web address] Password [facility] 09/26/2017, 9:58 AM

## 2017-09-26 NOTE — Progress Notes (Addendum)
PHARMACY - PHYSICIAN COMMUNICATION CRITICAL VALUE ALERT - BLOOD CULTURE IDENTIFICATION (BCID)  Nicole Cox is an 84 y.o. female who [facility] to [facility]2017 with a chief complaint of AMS and shortness of breath.   Assessment:  1/2 blood cultures grew acinetobacter baumannii. Leukocytosis and chest xray look better but patient did spike a fever overnight.   Name of physician (or Provider) Contacted: Delight HohE. Mathews   Current antibiotics: Zosyn / Vancomycin  Changes to prescribed antibiotics recommended: Broaden Zosyn to meropenem until susceptibilities result. Likely can d/c vancomycin Recommendations accepted by provider  Results for orders placed or performed during the hospital encounter of 09/25/17  Blood Culture ID Panel (Reflexed) (Collected: 09/25/2017  6:57 PM)  Result Value Ref Range   Enterococcus species NOT DETECTED NOT DETECTED   Listeria monocytogenes NOT DETECTED NOT DETECTED   Staphylococcus species NOT DETECTED NOT DETECTED   Staphylococcus aureus NOT DETECTED NOT DETECTED   Streptococcus species NOT DETECTED NOT DETECTED   Streptococcus agalactiae NOT DETECTED NOT DETECTED   Streptococcus pneumoniae NOT DETECTED NOT DETECTED   Streptococcus pyogenes NOT DETECTED NOT DETECTED   Acinetobacter baumannii DETECTED (A) NOT DETECTED   Enterobacteriaceae species NOT DETECTED NOT DETECTED   Enterobacter cloacae complex NOT DETECTED NOT DETECTED   Escherichia coli NOT DETECTED NOT DETECTED   Klebsiella oxytoca NOT DETECTED NOT DETECTED   Klebsiella pneumoniae NOT DETECTED NOT DETECTED   Proteus species NOT DETECTED NOT DETECTED   Serratia marcescens NOT DETECTED NOT DETECTED   Carbapenem resistance NOT DETECTED NOT DETECTED   Haemophilus influenzae NOT DETECTED NOT DETECTED   Neisseria meningitidis NOT DETECTED NOT DETECTED   Pseudomonas aeruginosa NOT DETECTED NOT DETECTED   Candida albicans NOT DETECTED NOT DETECTED   Candida glabrata NOT DETECTED NOT DETECTED    Candida krusei NOT DETECTED NOT DETECTED   Candida parapsilosis NOT DETECTED NOT DETECTED   Candida tropicalis NOT DETECTED NOT DETECTED    Vinnie LevelBenjamin Arcenia Scarbro, PharmD., BCPS Clinical Pharmacist Clinical phone for 09/26/17 until 3:30pm: [phone] If after 3:30pm, please refer to [facility] for unit-specific pharmacist   Addendum: 2nd blood culture set now growing coag negative staph on BCID. This is likely contaminant. Paged Dr. Jerolyn CenterMathews. No additional intervention necessary.   Vinnie LevelBenjamin Marilyne Haseley, PharmD., BCPS Clinical Pharmacist

## 2017-09-26 NOTE — Progress Notes (Signed)
Arterial blood gas drawn while patient on 100% NRB

## 2017-09-26 NOTE — Consult Note (Signed)
Renal Service Consult Note [facility]  Jackline Castilla 09/26/2017 Maree Krabbe Requesting Physician:  Dr Jerolyn Center  Reason for Consult: ESRD pt  HPI: The patient is a 84 y.o. year-old with hx of hypertension, CVA, DM, CAD, COPD and CHF presented to ED yest w/ c/o SOB, AMS.  Sent from SNF, sick x 1 week, outpt CXR showed "pna", was rx'd 7d w/ Rocephin at SNF.  After hd yest pt was confused at the SNF.  Pt admitted and rx'd as PNA vs pulm edema.  Asked to see for ESRD.   Pt is poor historian, daughters state that she has been having fevers and cough and that they were told she has pna and a blood infection again. Pt gets HD in [facility] w/ Dr Leretha Dykes on TTS, she had HD yest.  Daughter states and pt confirms she want's everything done if her heart or breathing stops working.   Denies any CP , abd pain or n/v.  Has had some diarrhea today.      echart:  jun 2014 - fall w/ C1 fx nonsurgical mgmnt, COPD, radicular hand pain bilat, esrd, hcap  jun 2017 - acute pulm edema w resp failure, rx abx/ HD, HTN urg, AMS, copd  oct 2017 - a/c resp failure, vol overload/ COPD exac, rx steroids/ nebs / HD  mar 2019 - acute pulm edema/ SOB, rx HD x 2, pressure sore on buttock,   tts hd , COPD  may 2019 - enterococcus/ acinetobacter bacteremia, TEE neg, IV abx per ID, esrd on HD and copd/ DM2    ROS  denies CP  no joint pain   no HA  no blurry vision  no rash  no diarrhea  no nausea/ vomiting  Past Medical History  Past Medical History:  Diagnosis Date  . CHF (congestive heart failure) (HCC)   . COPD (chronic obstructive pulmonary disease) (HCC)   . Coronary artery disease   . Diabetes mellitus without complication (HCC)   . Heart disease   . Hypertension   . Renal disorder   . Stroke [facility])    Past Surgical History  Past Surgical History:  Procedure Laterality Date  . ABDOMINAL HYSTERECTOMY    . CHOLECYSTECTOMY    . TEE WITHOUT CARDIOVERSION N/A 08/02/2017   Procedure:  TRANSESOPHAGEAL ECHOCARDIOGRAM (TEE);  Surgeon: Lars Masson, MD;  Location: [facility] ENDOSCOPY;  Service: Cardiovascular;  Laterality: N/A;  . TONSILLECTOMY     Family History  Family History  Problem Relation Age of Onset  . Heart disease Mother   . Hypertension Mother   . Heart disease Father   . Hypertension Father   . Heart disease Sister   . Diabetes Sister   . Stroke Sister   . Kidney disease Sister   . Hypertension Sister   . Heart disease Sister   . Kidney disease Sister   . Hypertension Sister   . Kidney disease Daughter   . Hypertension Daughter    Social History  reports that she has quit smoking. She has never used smokeless tobacco. She reports that she does not drink alcohol or use drugs. Allergies No Known Allergies Home medications Prior to Admission medications   Medication Sig Start Date End Date Taking? Authorizing Provider  albuterol (PROVENTIL HFA;VENTOLIN HFA) 108 (90 Base) MCG/ACT inhaler Inhale 2 puffs into the lungs every 6 (six) hours as needed for wheezing or shortness of breath. 08/17/15  Yes Mikhail, Nita Sells, DO  aspirin EC 81 MG tablet Take  81 mg by mouth daily.   Yes [provider]  cloNIDine (CATAPRES - DOSED IN MG/24 HR) 0.2 mg/24hr patch Place 1 patch (0.2 mg total) onto the skin every 7 (seven) days. 08/04/17  Yes Osvaldo Shipper, MD  diphenhydrAMINE (BENADRYL) 25 mg capsule Take 25 mg by mouth every morning.   Yes [provider]  doxercalciferol (HECTOROL) 2.5 MCG capsule Take 1 capsule (2.5 mcg total) by mouth Every Tuesday,Thursday,and Saturday with dialysis. 08/03/17  Yes Osvaldo Shipper, MD  fluticasone furoate-vilanterol (BREO ELLIPTA) 100-25 MCG/INH AEPB Inhale 1 puff into the lungs daily. 11/10/15  Yes [provider]  insulin detemir (LEVEMIR) 100 UNIT/ML injection Inject 5 Units into the skin daily.   Yes [provider]  isosorbide mononitrate (IMDUR) 30 MG 24 hr tablet Take 30 mg by mouth every evening.  03/21/17  Yes [provider]  lidocaine-prilocaine (EMLA) cream Apply 1 application topically every other day. 10/21/15  Yes [provider]  lisinopril (PRINIVIL,ZESTRIL) 40 MG tablet Take 40 mg by mouth every evening. 12/02/15  Yes [provider]  Melatonin 3 MG TABS Take 3 mg by mouth at bedtime.   Yes [provider]  metoprolol tartrate (LOPRESSOR) 25 MG tablet Take 0.5 tablets (12.5 mg total) by mouth 2 (two) times daily. Patient taking differently: Take 12.5 mg by mouth daily.  06/10/17  Yes Regalado, Belkys A, MD  NIFEdipine (PROCARDIA XL/ADALAT-CC) 60 MG 24 hr tablet Take 60 mg by mouth 2 (two) times daily. 12/03/15  Yes [provider]  nitroGLYCERIN (NITROSTAT) 0.4 MG SL tablet Place 0.4 mg under the tongue See admin instructions. Mat repeat every 5 minutes. If third tab needed call 911 06/23/17  Yes [provider]  Nutritional Supplements (FEEDING SUPPLEMENT, NEPRO CARB STEADY,) LIQD Take 237 mLs by mouth 2 (two) times daily between meals. 06/09/17  Yes Regalado, Belkys A, MD  pravastatin (PRAVACHOL) 40 MG tablet Take 40 mg by mouth daily.   Yes [provider]  traMADol (ULTRAM) 50 MG tablet Take 1 tablet (50 mg total) by mouth every 8 (eight) hours as needed. Patient taking differently: Take 50 mg by mouth every 8 (eight) hours as needed for moderate pain.  08/03/17  Yes Osvaldo Shipper, MD  umeclidinium bromide (INCRUSE ELLIPTA) 62.5 MCG/INH AEPB Inhale 1 puff into the lungs daily.   Yes [provider]   Liver Function Tests Recent Labs  Lab 09/25/17 1841  AST 111*  ALT 45*  ALKPHOS 106  BILITOT 1.2  PROT 6.5  ALBUMIN 2.3*   No results for input(s): LIPASE, AMYLASE in the last 168 hours. CBC Recent Labs  Lab 09/25/17 1841 09/26/17 0304  WBC 22.2* 11.9*  NEUTROABS 19.8*  --   HGB 9.5* 9.6*  HCT 31.6* 32.6*  MCV 89.5 89.3  PLT 295 268   Basic Metabolic Panel Recent Labs  Lab 09/25/17 1841  09/26/17 0304  NA 138 141  K 5.0 4.0  CL 102 103  CO2 25 28  GLUCOSE 86 65*  BUN 14 18  CREATININE 3.83* 4.80*  CALCIUM 7.6* 8.3*   Iron/TIBC/Ferritin/ %Sat    Component Value Date/Time   IRON 45 12/14/2015 0737   TIBC 214 (L) 12/14/2015 0737   FERRITIN 1,534 (H) 08/14/2012 1252   IRONPCTSAT 21 12/14/2015 0737    Vitals:   09/26/17 0833 09/26/17 1000 09/26/17 1100 09/26/17 1213  BP:  (!) 146/39 (!) 134/34 (!) 115/40  Pulse:  97 96 92  Resp:  (!) 30 (!)  28 (!) 24  Temp:    98.6 F (37 C)  TempSrc:    Oral  SpO2: 96% 93% 93% 93%  Weight:      Height:       Exam Gen pt on FM O2, not in distress, responsive No rash, cyanosis or gangrene Sclera anicteric, throat clear  No jvd or bruits, flat neck veins Chest occ exp wheezing, no rales to bases, occ rhonchi RRR no MRG Abd soft ntnd no mass or ascites +bs GU defer MS no joint effusions or deformity Ext no LE or UE edema, several areas superficial eschar bilat dorsal surface of the feet , poor pulses bilat feet, question area of gangrene R great toe at tip of foot Neuro is alert, Ox 3 , nf, +asterixis Upper ext AVG +bruit   Home meds:  - clonidine patch #2/ lisinopril 40 hs/ lopressor 12.5 bid/ procardia xl 60 bid  - ecasa 81/ imdur 30 hs/ sl ntg prn/ pravastatin 40   - levemir 5 u qd  - breo ellipta 1 puff qd  - ultram 50 tid prn pain     Dialysis: [facility]  Dr Leretha DykesZekan AV graft, heparin 2000, get records     Impression: 1  High fever/ ^^wbc/ pulm infiltrates/ +acinetobacter bacteremia - suspect xray changes are primarily infection w/ her clinical presentation, athough overlying pulm edema difficult to rule out. She is on a venti-mask.  Will plan short HD to try to lower volume some today.  Will follow.   2  [facility]RD TTS HD. Had outpt HD yesterday.  3  HTN - on mult bp meds at home., BP stable here 4  DM on insulin 5  COPD on inhalers at home. Hist of recurrent PNA 6  H/o CVA 7  Anemia ckd - Hb 9.6, stable, get OP  records Monday 8  Recurrent PNA - multiple old CXR's w/ RLL PNA in the past, question aspirating?    Plan - as above  Vinson Moselleob Ikram Riebe MD [facility] pager [phone]   09/26/2017, 12:56 PM

## 2017-09-27 ENCOUNTER — Inpatient Hospital Stay (HOSPITAL_COMMUNITY): Payer: Medicare HMO

## 2017-09-27 DIAGNOSIS — M79609 Pain in unspecified limb: Secondary | ICD-10-CM

## 2017-09-27 LAB — GLUCOSE, CAPILLARY
GLUCOSE-CAPILLARY: 65 mg/dL — AB (ref 70–99)
GLUCOSE-CAPILLARY: 124 mg/dL — AB (ref 70–99)
Glucose-Capillary: 124 mg/dL — ABNORMAL HIGH (ref 70–99)
Glucose-Capillary: 118 mg/dL — ABNORMAL HIGH (ref 70–99)

## 2017-09-27 LAB — HEPATITIS B SURFACE ANTIGEN: Hepatitis B Surface Ag: NEGATIVE

## 2017-09-27 MED ORDER — CHLORHEXIDINE GLUCONATE CLOTH 2 % EX PADS
6.0000 | MEDICATED_PAD | Freq: Every day | CUTANEOUS | Status: DC
  Administered 2017-09-27 – 2017-09-29 (×3): 6 via TOPICAL

## 2017-09-27 NOTE — Progress Notes (Signed)
Dialysis: [facility]  Dr Leretha DykesZekan AV graft, heparin 2000, get records     Impression: 1  High fever/ ^^wbc/ pulm infiltrates/ +acinetobacter bacteremia -  2  ESRD TTS HD. Had -2L last night on HD.  For HD again in AM 3  HTN - on mult bp meds at home., BP stable here 4  DM on insulin 5  COPD on inhalers at home. Hist of recurrent PNA 6  H/o CVA  Subjective: Interval History: Ventimask dependent today  Objective: Vital signs in last 24 hours: Temp:  [97.9 F (36.6 C)-99.4 F (37.4 C)] 98.3 F (36.8 C) (07/15 0415) Pulse Rate:  [92-103] 95 (07/15 0728) Resp:  [24-31] 24 (07/15 0728) BP: (112-153)/(28-52) 144/37 (07/15 0728) SpO2:  [92 %-99 %] 96 % (07/15 0728) FiO2 (%):  [40 %-55 %] 40 % (07/15 0045) Weight:  [58.5 kg (128 lb 15.5 oz)-60.5 kg (133 lb 6.1 oz)] 58.5 kg (128 lb 15.5 oz) (07/15 0045) Weight change: -1.189 kg (-2 lb 10 oz)  Intake/Output from previous day: 07/14 0701 - 07/15 0700 In: 300 [IV Piggyback:300] Out: 2000  Intake/Output this shift: No intake/output data recorded.  General appearance: alert and cooperative Resp: clear to auscultation bilaterally Chest wall: no tenderness GI: soft, non-tender; bowel sounds normal; no masses,  no organomegaly Extremities: extremities normal, atraumatic, no cyanosis or edema and LUE AVGG  Lab Results: Recent Labs    09/25/17 1841 09/26/17 0304  WBC 22.2* 11.9*  HGB 9.5* 9.6*  HCT 31.6* 32.6*  PLT 295 268   BMET:  Recent Labs    09/25/17 1841 09/26/17 0304  NA 138 141  K 5.0 4.0  CL 102 103  CO2 25 28  GLUCOSE 86 65*  BUN 14 18  CREATININE 3.83* 4.80*  CALCIUM 7.6* 8.3*   No results for input(s): PTH in the last 72 hours. Iron Studies: No results for input(s): IRON, TIBC, TRANSFERRIN, FERRITIN in the last 72 hours. Studies/Results: Dg Chest 1 View  Result Date: 09/26/2017 CLINICAL DATA:  Hypoxia EXAM: CHEST  1 VIEW COMPARISON:  06/07/2017 FINDINGS: Diffuse bilateral airspace disease with interval  improvement likely due to edema. No significant effusion. Cardiac enlargement. Large calcific pleural plaque in the right lung base is unchanged. IMPRESSION: Improvement in pulmonary edema pattern. Electronically Signed   By: Marlan Palauharles  Clark M.D.   On: 09/26/2017 12:03   Dg Chest Portable 1 View  Result Date: 09/25/2017 CLINICAL DATA:  Shortness of breath. EXAM: PORTABLE CHEST 1 VIEW COMPARISON:  07/27/2017 FINDINGS: Midline trachea. Moderate cardiomegaly, accentuated by low lung volumes and AP portable technique. Atherosclerosis in the transverse aorta. No definite pleural fluid. The Chin overlies the apices minimally. No pneumothorax. Moderate pulmonary interstitial prominence and indistinctness, similar. Patchy bibasilar airspace disease is not significantly changed. More focal increased density projecting over the right lung base likely corresponds to a calcified pleural plaque when correlated with the lateral radiograph of 08/14/2012. IMPRESSION: 1. No change in moderate congestive heart failure. 2. Similar bibasilar Airspace disease, likely atelectasis. 3. Calcified right pleural plaque, possibly related to prior empyema or hemothorax. 4.  Aortic Atherosclerosis (ICD10-I70.0). Electronically Signed   By: Jeronimo GreavesKyle  Talbot M.D.   On: 09/25/2017 19:01    Scheduled: . aspirin EC  81 mg Oral Daily  . Chlorhexidine Gluconate Cloth  6 each Topical Q0600  . Chlorhexidine Gluconate Cloth  6 each Topical Q0600  . [START ON 09/28/2017] doxercalciferol  2.5 mcg Oral Q T,Th,Sa-HD  . feeding supplement (NEPRO CARB STEADY)  237 mL Oral BID BM  . fluticasone furoate-vilanterol  1 puff Inhalation Daily  . heparin  5,000 Units Subcutaneous Q8H  . insulin aspart  0-9 Units Subcutaneous TID WC  . isosorbide mononitrate  30 mg Oral QPM  . lisinopril  40 mg Oral QPM  . mouth rinse  15 mL Mouth Rinse BID  . metoprolol tartrate  12.5 mg Oral Daily  . NIFEdipine  60 mg Oral BID  . pneumococcal 23 valent vaccine  0.5 mL  Intramuscular Tomorrow-1000  . pravastatin  40 mg Oral Daily  . umeclidinium bromide  1 puff Inhalation Daily     LOS: 2 days   Lauris Poag 09/27/2017,8:06 AM

## 2017-09-27 NOTE — Progress Notes (Signed)
PROGRESS NOTE    Nicole DecantLizzie Cox  ZOX:[id number] DOB: 1931/12/20 DOA: 09/25/2017 PCP: [name], J Stewart, MD  Brief Narrative:84 y.o.femalewith medical history significant ofcongestive heart failure, coronary artery disease, COPD, diabetes, hypertension, end-stage renal disease on dialysis, CVA, presents to emergency department with altered level of consciousness and shortness of breath. Patient is from nursing home facility. Patient apparently has been sick for a week. She had an outpatient x-ray which showed pneumonia. She has been treated with Rocephin for 7 days now. Today she went to dialysis and when she got back, patient seemed to be more disoriented than at baseline, not responding as she normally would. Patient was transferred here for further evaluation. Patient unable to provide much history but states she is not hurting anywhere.   ED Course:CXR calling CHF, bibasilar atelectasis. Patient with rales and O2 requirement via [place]. Tm 101.x WBC 22.2k. She is awake and talking, but disoriented / confused.      Assessment & Plan:   Principal Problem:   SIRS (systemic inflammatory response syndrome) (HCC) Active Problems:   Hypertension   DM (diabetes mellitus), type 2 with renal complications (HCC)   COPD (chronic obstructive pulmonary disease) (HCC)   Acute respiratory failure with hypoxia (HCC)   ESRD on hemodialysis (HCC)   Delirium 1] acute hypoxic respiratory failure   Secondary to combination of fluid overload plus healthcare associated pneumonia.  Antibiotics changed to meropenem.  2] ESRD on dialysis Tuesday Thursday Saturdays.  Patient had a short course of dialysis yesterday and again for dialysis today.3] hypertension blood pressure was very elevated upon admission.  Now it is come down to low to low normal.  She has received 1 dose of beta-blocker overnight.  3] type 2 diabetes continue sliding scale insulin until she is stabilized.  4] history of  COPD continue home medications.  5]?PAD-check arterial doppler.  Patient has decreased pulses in both lower extremities.  6] Enterobacter bacteremia antibiotics changed to meropenem.  Sensitivities pending.  7] possible aspiration patient on dysphagia diet the nursing home.  Speech therapy consulted.        DVT prophylaxis: Heparin Code Status: Full code Family Communication: Discussed with daughter Disposition Plan TBD Consultants:  Nephrology Procedures: None Antimicrobials:  Meropenem Subjective: Resting in bed with a nonrebreather mask.  Easily arousable responds appropriately and answer my questions appropriately.   Objective: Vitals:   09/27/17 0500 09/27/17 0600 09/27/17 0728 09/27/17 0923  BP: (!) 131/43 (!) 135/32 (!) 144/37   Pulse:   95   Resp: (!) 28 (!) 31 (!) 24 (!) 30  Temp:      TempSrc:      SpO2: 94% 93% 96% 95%  Weight:      Height:        Intake/Output Summary (Last 24 hours) at 09/27/2017 1038 Last data filed at 09/27/2017 0300 Gross per 24 hour  Intake 200 ml  Output 2000 ml  Net -1800 ml   Filed Weights   09/25/17 1904 09/26/17 2135 09/27/17 0045  Weight: 61.7 kg (136 lb) 60.5 kg (133 lb 6.1 oz) 58.5 kg (128 lb 15.5 oz)    Examination:  General exam: Appears calm and comfortable  Respiratory system: Decreased breath sounds at the bases auscultation. Respiratory effort normal. Cardiovascular system: S1 & S2 heard, RRR. No JVD, murmurs, rubs, gallops or clicks. No pedal edema. Gastrointestinal system: Abdomen is nondistended, soft and nontender. No organomegaly or masses felt. Normal bowel sounds heard. Central nervous system: Alert and oriented. No focal neurological  deficits. Extremities: Symmetric 5 x 5 power.  Multiple healed scabbed lesions on the skin in both lower extremities with diminished pulses. Skin: No rashes, lesions or ulcers Psychiatry: Judgement and insight appear normal. Mood & affect appropriate.     Data  Reviewed: I have personally reviewed following labs and imaging studies  CBC: Recent Labs  Lab 09/25/17 1841 09/26/17 0304  WBC 22.2* 11.9*  NEUTROABS 19.8*  --   HGB 9.5* 9.6*  HCT 31.6* 32.6*  MCV 89.5 89.3  PLT 295 268   Basic Metabolic Panel: Recent Labs  Lab 09/25/17 1841 09/26/17 0304  NA 138 141  K 5.0 4.0  CL 102 103  CO2 25 28  GLUCOSE 86 65*  BUN 14 18  CREATININE 3.83* 4.80*  CALCIUM 7.6* 8.3*   GFR: Estimated Creatinine Clearance: 7.3 mL/min (A) (by C-G formula based on SCr of 4.8 mg/dL (H)). Liver Function Tests: Recent Labs  Lab 09/25/17 1841  AST 111*  ALT 45*  ALKPHOS 106  BILITOT 1.2  PROT 6.5  ALBUMIN 2.3*   No results for input(s): LIPASE, AMYLASE in the last 168 hours. No results for input(s): AMMONIA in the last 168 hours. Coagulation Profile: No results for input(s): INR, PROTIME in the last 168 hours. Cardiac Enzymes: No results for input(s): CKTOTAL, CKMB, CKMBINDEX, TROPONINI in the last 168 hours. BNP (last 3 results) No results for input(s): PROBNP in the last 8760 hours. HbA1C: No results for input(s): HGBA1C in the last 72 hours. CBG: Recent Labs  Lab 09/26/17 1207 09/26/17 1715 09/26/17 1955 09/27/17 0726 09/27/17 0826  GLUCAP 137* 163* 145* 65* 124*   Lipid Profile: No results for input(s): CHOL, HDL, LDLCALC, TRIG, CHOLHDL, LDLDIRECT in the last 72 hours. Thyroid Function Tests: No results for input(s): TSH, T4TOTAL, FREET4, T3FREE, THYROIDAB in the last 72 hours. Anemia Panel: No results for input(s): VITAMINB12, FOLATE, FERRITIN, TIBC, IRON, RETICCTPCT in the last 72 hours. Sepsis Labs: Recent Labs  Lab 09/25/17 1847  LATICACIDVEN 1.67    Recent Results (from the past 240 hour(s))  Blood culture (routine x 2)     Status: Abnormal (Preliminary result)   Collection Time: 09/25/17  6:41 PM  Result Value Ref Range Status   Specimen Description BLOOD RIGHT FOREARM  Final   Special Requests   Final    BOTTLES  DRAWN AEROBIC AND ANAEROBIC Blood Culture adequate volume   Culture  Setup Time   Final    GRAM POSITIVE COCCI AEROBIC BOTTLE ONLY CRITICAL RESULT CALLED TO, READ BACK BY AND VERIFIED WITH: B. MANCHERIL, RPHARMD AT 1604 ON 09/26/17 BY C. JESSUP, MLT. Performed at [facility], [address], [place], [place] [address]    Culture STAPHYLOCOCCUS SPECIES (COAGULASE NEGATIVE) (A)  Final   Report Status PENDING  Incomplete  Blood Culture ID Panel (Reflexed)     Status: Abnormal   Collection Time: 09/25/17  6:41 PM  Result Value Ref Range Status   Enterococcus species NOT DETECTED NOT DETECTED Final   Listeria monocytogenes NOT DETECTED NOT DETECTED Final   Staphylococcus species DETECTED (A) NOT DETECTED Final    Comment: Methicillin (oxacillin) resistant coagulase negative staphylococcus. Possible blood culture contaminant (unless isolated from more than one blood culture draw or clinical case suggests pathogenicity). No antibiotic treatment is indicated for blood  culture contaminants. CRITICAL RESULT CALLED TO, READ BACK BY AND VERIFIED WITH: B. MANCHERIL, RPHARMD AT 1604 ON 09/26/17 BY C. JESSUP, MLT.    Staphylococcus aureus NOT DETECTED NOT  DETECTED Final   Methicillin resistance DETECTED (A) NOT DETECTED Final    Comment: CRITICAL RESULT CALLED TO, READ BACK BY AND VERIFIED WITH: B. MANCHERIL, RPHARMD AT 1604 ON 09/26/17 BY C. JESSUP, MLT.    Streptococcus species NOT DETECTED NOT DETECTED Final   Streptococcus agalactiae NOT DETECTED NOT DETECTED Final   Streptococcus pneumoniae NOT DETECTED NOT DETECTED Final   Streptococcus pyogenes NOT DETECTED NOT DETECTED Final   Acinetobacter baumannii NOT DETECTED NOT DETECTED Final   Enterobacteriaceae species NOT DETECTED NOT DETECTED Final   Enterobacter cloacae complex NOT DETECTED NOT DETECTED Final   Escherichia coli NOT DETECTED NOT DETECTED Final   Klebsiella oxytoca NOT DETECTED NOT DETECTED Final   Klebsiella pneumoniae NOT  DETECTED NOT DETECTED Final   Proteus species NOT DETECTED NOT DETECTED Final   Serratia marcescens NOT DETECTED NOT DETECTED Final   Haemophilus influenzae NOT DETECTED NOT DETECTED Final   Neisseria meningitidis NOT DETECTED NOT DETECTED Final   Pseudomonas aeruginosa NOT DETECTED NOT DETECTED Final   Candida albicans NOT DETECTED NOT DETECTED Final   Candida glabrata NOT DETECTED NOT DETECTED Final   Candida krusei NOT DETECTED NOT DETECTED Final   Candida parapsilosis NOT DETECTED NOT DETECTED Final   Candida tropicalis NOT DETECTED NOT DETECTED Final    Comment: Performed at [facility], [address], [place], [place] [address]  Blood culture (routine x 2)     Status: Abnormal (Preliminary result)   Collection Time: 09/25/17  6:57 PM  Result Value Ref Range Status   Specimen Description BLOOD RIGHT WRIST  Final   Special Requests   Final    BOTTLES DRAWN AEROBIC AND ANAEROBIC Blood Culture adequate volume   Culture  Setup Time   Final    GRAM NEGATIVE RODS AEROBIC BOTTLE ONLY CRITICAL RESULT CALLED TO, READ BACK BY AND VERIFIED WITH: B. MANCHERIL, RPHARMD AT 1250 ON 09/26/17 BY C. JESSUP, MLT.    Culture (A)  Final    ACINETOBACTER BAUMANNII SUSCEPTIBILITIES TO FOLLOW Performed at [facility], [address], [place], [place] [address]    Report Status PENDING  Incomplete  Blood Culture ID Panel (Reflexed)     Status: Abnormal   Collection Time: 09/25/17  6:57 PM  Result Value Ref Range Status   Enterococcus species NOT DETECTED NOT DETECTED Final   Listeria monocytogenes NOT DETECTED NOT DETECTED Final   Staphylococcus species NOT DETECTED NOT DETECTED Final   Staphylococcus aureus NOT DETECTED NOT DETECTED Final   Streptococcus species NOT DETECTED NOT DETECTED Final   Streptococcus agalactiae NOT DETECTED NOT DETECTED Final   Streptococcus pneumoniae NOT DETECTED NOT DETECTED Final   Streptococcus pyogenes NOT DETECTED NOT DETECTED Final   Acinetobacter  baumannii DETECTED (A) NOT DETECTED Final    Comment: CRITICAL RESULT CALLED TO, READ BACK BY AND VERIFIED WITH: B. MANCHERIL, RPHARMD AT 1250 ON 09/26/17 BY C. JESSUP, MLT.    Enterobacteriaceae species NOT DETECTED NOT DETECTED Final   Enterobacter cloacae complex NOT DETECTED NOT DETECTED Final   Escherichia coli NOT DETECTED NOT DETECTED Final   Klebsiella oxytoca NOT DETECTED NOT DETECTED Final   Klebsiella pneumoniae NOT DETECTED NOT DETECTED Final   Proteus species NOT DETECTED NOT DETECTED Final   Serratia marcescens NOT DETECTED NOT DETECTED Final   Carbapenem resistance NOT DETECTED NOT DETECTED Final   Haemophilus influenzae NOT DETECTED NOT DETECTED Final   Neisseria meningitidis NOT DETECTED NOT DETECTED Final   Pseudomonas aeruginosa NOT DETECTED NOT DETECTED  Final   Candida albicans NOT DETECTED NOT DETECTED Final   Candida glabrata NOT DETECTED NOT DETECTED Final   Candida krusei NOT DETECTED NOT DETECTED Final   Candida parapsilosis NOT DETECTED NOT DETECTED Final   Candida tropicalis NOT DETECTED NOT DETECTED Final    Comment: Performed at [facility], [address] [place], [place] [address]  MRSA PCR Screening     Status: None   Collection Time: 09/26/17 12:53 AM  Result Value Ref Range Status   MRSA by PCR NEGATIVE NEGATIVE Final    Comment:        The GeneXpert MRSA Assay (FDA approved for NASAL specimens only), is one component of a comprehensive MRSA colonization surveillance program. It is not intended to diagnose MRSA infection nor to guide or monitor treatment for MRSA infections. Performed at [facility], [address] [place], [place] [address]   C difficile quick scan w PCR reflex     Status: None   Collection Time: 09/26/17  2:31 PM  Result Value Ref Range Status   C Diff antigen NEGATIVE NEGATIVE Final   C Diff toxin NEGATIVE NEGATIVE Final   C Diff interpretation No C. difficile detected.  Final    Comment: Performed at  [facility], [address] [place], [place] [address]         Radiology Studies: Dg Chest 1 View  Result Date: 09/26/2017 CLINICAL DATA:  Hypoxia EXAM: CHEST  1 VIEW COMPARISON:  06/07/2017 FINDINGS: Diffuse bilateral airspace disease with interval improvement likely due to edema. No significant effusion. Cardiac enlargement. Large calcific pleural plaque in the right lung base is unchanged. IMPRESSION: Improvement in pulmonary edema pattern. Electronically Signed   By: Marlan Palau M.D.   On: 09/26/2017 12:03   Dg Chest Portable 1 View  Result Date: 09/25/2017 CLINICAL DATA:  Shortness of breath. EXAM: PORTABLE CHEST 1 VIEW COMPARISON:  07/27/2017 FINDINGS: Midline trachea. Moderate cardiomegaly, accentuated by low lung volumes and AP portable technique. Atherosclerosis in the transverse aorta. No definite pleural fluid. The Chin overlies the apices minimally. No pneumothorax. Moderate pulmonary interstitial prominence and indistinctness, similar. Patchy bibasilar airspace disease is not significantly changed. More focal increased density projecting over the right lung base likely corresponds to a calcified pleural plaque when correlated with the lateral radiograph of 08/14/2012. IMPRESSION: 1. No change in moderate congestive heart failure. 2. Similar bibasilar Airspace disease, likely atelectasis. 3. Calcified right pleural plaque, possibly related to prior empyema or hemothorax. 4.  Aortic Atherosclerosis (ICD10-I70.0). Electronically Signed   By: Jeronimo Greaves M.D.   On: 09/25/2017 19:01        Scheduled Meds: . aspirin EC  81 mg Oral Daily  . Chlorhexidine Gluconate Cloth  6 each Topical Q0600  . Chlorhexidine Gluconate Cloth  6 each Topical Q0600  . [START ON 09/28/2017] doxercalciferol  2.5 mcg Oral Q T,Th,Sa-HD  . feeding supplement (NEPRO CARB STEADY)  237 mL Oral BID BM  . fluticasone furoate-vilanterol  1 puff Inhalation Daily  . heparin  5,000 Units Subcutaneous Q8H    . insulin aspart  0-9 Units Subcutaneous TID WC  . isosorbide mononitrate  30 mg Oral QPM  . lisinopril  40 mg Oral QPM  . mouth rinse  15 mL Mouth Rinse BID  . metoprolol tartrate  12.5 mg Oral Daily  . NIFEdipine  60 mg Oral BID  . pravastatin  40 mg Oral Daily  . umeclidinium bromide  1 puff  Inhalation Daily   Continuous Infusions: . meropenem (MERREM) IV Stopped (09/27/17 0700)     LOS: 2 days     Alwyn Ren, MD [facility]  If 7PM-7AM, please contact night-coverage [email or web address] Password [facility] 09/27/2017, 10:38 AM

## 2017-09-27 NOTE — Progress Notes (Signed)
VASCULAR LAB PRELIMINARY  ARTERIAL  ABI completed:    RIGHT    LEFT    PRESSURE WAVEFORM  PRESSURE WAVEFORM  BRACHIAL 134 T BRACHIAL - T  DP 246 B DP 244   AT   AT    PT 255 B PT 2551.84   PER   PER    GREAT TOE  NA GREAT TOE  NA    RIGHT LEFT  ABI 1.84 1.82  ABI's are unreliable due to non compressibility bilaterally.    Leander RamsMegan E Benjamine Strout 09/27/2017, 3:43 PM

## 2017-09-27 NOTE — Evaluation (Addendum)
Clinical/Bedside Swallow Evaluation Patient Details  Name: Nicole Cox MRN: [id number] Date of Birth: Feb 27, 1932  Today's Date: 09/27/2017 Time: SLP Start Time (ACUTE ONLY): 1032 SLP Stop Time (ACUTE ONLY): 1045 SLP Time Calculation (min) (ACUTE ONLY): 13 min  Past Medical History:  Past Medical History:  Diagnosis Date  . CHF (congestive heart failure) (HCC)   . COPD (chronic obstructive pulmonary disease) (HCC)   . Coronary artery disease   . Diabetes mellitus without complication (HCC)   . Heart disease   . Hypertension   . Renal disorder   . Stroke [facility])    Past Surgical History:  Past Surgical History:  Procedure Laterality Date  . ABDOMINAL HYSTERECTOMY    . CHOLECYSTECTOMY    . TEE WITHOUT CARDIOVERSION N/A 08/02/2017   Procedure: TRANSESOPHAGEAL ECHOCARDIOGRAM (TEE);  Surgeon: Lars Masson, MD;  Location: [facility] ENDOSCOPY;  Service: Cardiovascular;  Laterality: N/A;  . TONSILLECTOMY     HPI:  Pt is an 84 y.o. female who presents from SNF with AMS, SOB after dialysis. Outpatient CXR showed PNA. Pt denies any h/o dysphagia but is on a dysphagia diet per MD note and although she believes she has had PNA before she is not sure when this was. PMH: CHF, CAD, COPD, diabetes, HTN, ESRD, CVA   Assessment / Plan / Recommendation Clinical Impression  Pt has quick desaturations upon removal of ventimask, with RR in the low 20s at baselind but increasing to around 30 during attempts at eating and drinking. She does appear to have consistent breath hold for swallowing and immediate exhalation post-swallow, but she does need rest breaks in between each bite/sip to allow her RR to return to baseline. After a few bites of puree, immediate coughing was elicited upon returning to sips of water. Given signs concerning for aspiration and her current respiratory status, would recommend making pt NPO except for meds crushed in puree and ice chips/small, single sips of water after oral care (MD  paged with recommendation). Would hold POs if coughing is noted or if RR is at 30 or above. SLP will f/u for readiness to start diet versus need for MBS as respiratory status improves. SLP Visit Diagnosis: Dysphagia, unspecified (R13.10)    Aspiration Risk  Moderate aspiration risk    Diet Recommendation NPO except meds;Ice chips PRN after oral care   Liquid Administration via: Straw Medication Administration: Crushed with puree Supervision: Staff to assist with self feeding;Full supervision/cueing for compensatory strategies    Other  Recommendations Oral Care Recommendations: Oral care QID;Oral care prior to ice chip/H20   Follow up Recommendations Skilled Nursing facility      Frequency and Duration min 2x/week  2 weeks       Prognosis Prognosis for Safe Diet Advancement: Good      Swallow Study   General HPI: Pt is an 84 y.o. female who presents from SNF with AMS, SOB after dialysis. Outpatient CXR showed PNA. Pt denies any h/o dysphagia but is on a dysphagia diet per MD note and although she believes she has had PNA before she is not sure when this was. PMH: CHF, CAD, COPD, diabetes, HTN, ESRD, CVA Type of Study: Bedside Swallow Evaluation Previous Swallow Assessment: none in chart but on dysphagia diet per MD note Diet Prior to this Study: Regular;Thin liquids Temperature Spikes Noted: No Respiratory Status: Venti-mask History of Recent Intubation: No Behavior/Cognition: Alert;Cooperative;Requires cueing Oral Cavity Assessment: Within Functional Limits Oral Care Completed by SLP: No Oral Cavity - Dentition: Missing dentition  Self-Feeding Abilities: Needs assist Patient Positioning: Upright in bed Baseline Vocal Quality: Normal    Oral/Motor/Sensory Function Overall Oral Motor/Sensory Function: Within functional limits   Ice Chips Ice chips: Not tested   Thin Liquid Thin Liquid: Impaired Presentation: Straw Pharyngeal  Phase Impairments: Cough - Immediate     Nectar Thick Nectar Thick Liquid: Not tested   Honey Thick Honey Thick Liquid: Not tested   Puree Puree: Within functional limits Presentation: Spoon   Solid   GO   Solid: Not tested        Maxcine Hamaiewonsky, Azyiah Bo 09/27/2017,11:16 AM  Maxcine HamLaura Paiewonsky, M.A. CCC-SLP [phone]

## 2017-09-27 NOTE — Care Management Note (Addendum)
Case Management Note  Patient Details  Name: Nicole Cox MRN: [id number] Date of Birth: [birth year]  Subjective/Objective:  From [facility], presents with SIRS, acute hypoxic resp failure secondary to fluid overload and hcap. She is HD patient  T,TH, Sat, on iv abx, on venturi mask.                  Action/Plan: DC back to SNF when medically ready.  Expected Discharge Date:                  Expected Discharge Plan:  Skilled Nursing Facility  In-House Referral:  Clinical Social Work  Discharge planning Services  CM Consult  Post Acute Care Choice:    Choice offered to:     DME Arranged:    DME Agency:     HH Arranged:    HH Agency:     Status of Service:  In process, will continue to follow  If discussed at Long Length of Stay Meetings, dates discussed:    Additional Comments:  Leone Havenaylor, Fredda Clarida Clinton, RN 09/27/2017, 9:14 AM

## 2017-09-28 ENCOUNTER — Inpatient Hospital Stay (HOSPITAL_COMMUNITY): Payer: Medicare HMO

## 2017-09-28 LAB — CBC
HEMATOCRIT: 27.6 % — AB (ref 36.0–46.0)
HEMOGLOBIN: 8.2 g/dL — AB (ref 12.0–15.0)
MCH: 26.2 pg (ref 26.0–34.0)
MCHC: 29.7 g/dL — ABNORMAL LOW (ref 30.0–36.0)
MCV: 88.2 fL (ref 78.0–100.0)
Platelets: 314 10*3/uL (ref 150–400)
RBC: 3.13 MIL/uL — AB (ref 3.87–5.11)
RDW: 19.3 % — ABNORMAL HIGH (ref 11.5–15.5)
WBC: 25.1 10*3/uL — ABNORMAL HIGH (ref 4.0–10.5)

## 2017-09-28 LAB — RENAL FUNCTION PANEL
ANION GAP: 11 (ref 5–15)
Albumin: 2.1 g/dL — ABNORMAL LOW (ref 3.5–5.0)
BUN: 37 mg/dL — ABNORMAL HIGH (ref 8–23)
CHLORIDE: 100 mmol/L (ref 98–111)
CO2: 26 mmol/L (ref 22–32)
Calcium: 8.7 mg/dL — ABNORMAL LOW (ref 8.9–10.3)
Creatinine, Ser: 5.8 mg/dL — ABNORMAL HIGH (ref 0.44–1.00)
GFR calc Af Amer: 7 mL/min — ABNORMAL LOW (ref >60)
GFR calc non Af Amer: 6 mL/min — ABNORMAL LOW (ref >60)
GLUCOSE: 125 mg/dL — AB (ref 70–99)
POTASSIUM: 3.8 mmol/L (ref 3.5–5.1)
Phosphorus: 2.4 mg/dL — ABNORMAL LOW (ref 2.5–4.6)
SODIUM: 137 mmol/L (ref 135–145)

## 2017-09-28 LAB — CULTURE, BLOOD (ROUTINE X 2)
SPECIAL REQUESTS: ADEQUATE
SPECIAL REQUESTS: ADEQUATE

## 2017-09-28 LAB — GLUCOSE, CAPILLARY
Glucose-Capillary: 121 mg/dL — ABNORMAL HIGH (ref 70–99)
Glucose-Capillary: 117 mg/dL — ABNORMAL HIGH (ref 70–99)
Glucose-Capillary: 88 mg/dL (ref 70–99)
Glucose-Capillary: 129 mg/dL — ABNORMAL HIGH (ref 70–99)
Glucose-Capillary: 109 mg/dL — ABNORMAL HIGH (ref 70–99)

## 2017-09-28 MED ORDER — HEPARIN SODIUM (PORCINE) 1000 UNIT/ML DIALYSIS: 20.0000 [IU]/kg | INTRAMUSCULAR | Status: DC | PRN

## 2017-09-28 MED ORDER — SODIUM CHLORIDE 0.9 % IV SOLN: 100.0000 mL | INTRAVENOUS | Status: DC | PRN

## 2017-09-28 MED ORDER — LIDOCAINE HCL (PF) 1 % IJ SOLN: 5.0000 mL | INTRAMUSCULAR | Status: DC | PRN

## 2017-09-28 MED ORDER — PENTAFLUOROPROP-TETRAFLUOROETH EX AERO: 1.0000 "application " | INHALATION_SPRAY | CUTANEOUS | Status: DC | PRN

## 2017-09-28 MED ORDER — ALTEPLASE 2 MG IJ SOLR: 2.0000 mg | Freq: Once | INTRAMUSCULAR | Status: DC | PRN

## 2017-09-28 MED ORDER — LISINOPRIL 20 MG PO TABS
20.0000 mg | ORAL_TABLET | Freq: Every evening | ORAL | Status: DC
  Administered 2017-09-28 – 2017-10-02 (×4): 20 mg via ORAL
  Filled 2017-09-28 (×5): qty 1

## 2017-09-28 MED ORDER — LIDOCAINE-PRILOCAINE 2.5-2.5 % EX CREA: 1.0000 "application " | TOPICAL_CREAM | CUTANEOUS | Status: DC | PRN

## 2017-09-28 MED ORDER — SODIUM CHLORIDE 0.9 % IV SOLN
1.5000 g | Freq: Two times a day (BID) | INTRAVENOUS | Status: DC
  Administered 2017-09-28 – 2017-10-01 (×5): 1.5 g via INTRAVENOUS
  Filled 2017-09-28 (×8): qty 1.5

## 2017-09-28 MED ORDER — DOXERCALCIFEROL 4 MCG/2ML IV SOLN
INTRAVENOUS | Status: AC
  Filled 2017-09-28: qty 2

## 2017-09-28 MED ORDER — HEPARIN SODIUM (PORCINE) 1000 UNIT/ML DIALYSIS: 1000.0000 [IU] | INTRAMUSCULAR | Status: DC | PRN

## 2017-09-28 NOTE — Progress Notes (Signed)
  Speech Language Pathology Treatment: Dysphagia  Patient Details Name: Nicole Cox MRN: [id number] DOB: [birth year] Today's Date: 09/28/2017 Time: [id number] SLP Time Calculation (min) (ACUTE ONLY): 10 min  Assessment / Plan / Recommendation Clinical Impression  Pt has no overt signs of aspiration with trials of thin liquids or purees today, but she continues to desaturate quickly upon removal of her mask to provide POs. SLP provided extra time for recovery, Min-Mod cues for sustained attention to PO trials, and Min cues for pt to keep the mask in place. She remains at an elevated risk for aspiration with her current respiratory status. Would continue current recommendations as outlined below pending stabilization of respiratory status, at which time she may also be appropriate for MBS.   HPI HPI: Pt is an 84 y.o. female who presents from SNF with AMS, SOB after dialysis. Outpatient CXR showed PNA. Pt denies any h/o dysphagia but is on a dysphagia diet per MD note and although she believes she has had PNA before she is not sure when this was. PMH: CHF, CAD, COPD, diabetes, HTN, ESRD, CVA      SLP Plan  Continue with current plan of care       Recommendations  Diet recommendations: NPO;Other(comment)(few ice chips or sips of water after oral care) Medication Administration: Crushed with puree                Oral Care Recommendations: Oral care QID;Oral care prior to ice chip/H20 Follow up Recommendations: Skilled Nursing facility SLP Visit Diagnosis: Dysphagia, unspecified (R13.10) Plan: Continue with current plan of care       GO                Nicole Cox, Nicole Cox 09/28/2017, 3:50 PM  Nicole Cox, M.A. CCC-SLP [phone]

## 2017-09-28 NOTE — Progress Notes (Signed)
Patient is from [facility] [place]- attempted to call pt HCPOA [name] to confirm- left message  CSW will continue to follow and assist with disposition when stable  Burna SisJenna H. Reshanda Lewey, LCSW Clinical Social Worker [phone]

## 2017-09-28 NOTE — Procedures (Signed)
[name] HD trying for additional volume, goal 3000cc Nicole PoagAlvin C [name], MD

## 2017-09-28 NOTE — Progress Notes (Addendum)
Initial Nutrition Assessment  DOCUMENTATION CODES:   Not applicable  INTERVENTION:    Advance diet as medically appropriate   Once able add Nepro Shake supplement po BID  NUTRITION DIAGNOSIS:   Increased nutrient needs related to chronic illness(ESRD on HD) as evidenced by estimated needs  GOAL:   Patient will meet greater than or equal to 90% of their needs  MONITOR:   PO intake, Supplement acceptance, Labs, Skin, Weight trends, I & O's  REASON FOR ASSESSMENT:   Malnutrition Screening Tool  ASSESSMENT:   84 yo Female with PMH of COPD, diabetes, hypertension, end-stage renal disease on dialysis, CVA, presented to ED with altered level of consciousness and SOB.  Pt working with SLP upon RD visit. Spoke with daughter, Nicole Cox via phone. Pt currently resides at [facility]. [name] reports pt's appetite was good, however, "they" were saying pt was having some swallowing difficulty.  Daughter also states pt was needing feeding assistance with meals as her hands shake.  Pt receives Nepro Carb Steady supplements at SNF and likes them. [name] believes pt's weight has been stable.   Pt s/p bedside swallow evaluation 7/15. SLP recommending NPO status. Labs and medications reviewed. Phos 2.4 (L). CBG's [id number].  NUTRITION - FOCUSED PHYSICAL EXAM:  Deferred at this time.  Diet Order:   Diet Order           Diet NPO time specified  Diet effective now         EDUCATION NEEDS:   No education needs have been identified at this time  Skin:  Skin Assessment: Reviewed RN Assessment  Last BM:  7/14  Height:   Ht Readings from Last 1 Encounters:  09/25/17 5' 2.99" (1.6 m)   Weight:   Wt Readings from Last 1 Encounters:  09/28/17 124 lb 1.9 oz (56.3 kg)   BMI:  Body mass index is 21.99 kg/m.  Estimated Nutritional Needs:   Kcal:  1600-1800  Protein:  70-85 gm  Fluid:  1000 ml + UOP  Maureen ChattersKatie Peony Barner, RD, LDN Pager #: [phone] After-Hours Pager  #: [phone]

## 2017-09-28 NOTE — Progress Notes (Signed)
PROGRESS NOTE    Nicole Cox  ZOX:[id number] DOB: Jan 31, 1932 DOA: 09/25/2017 PCP: Joana Reamer, MD   Brief Nicole Cox y.o.femalewith medical history significant ofcongestive heart failure, coronary artery disease, COPD, diabetes, hypertension, end-stage renal disease on dialysis, CVA, presents to emergency department with altered level of consciousness and shortness of breath. Patient is from nursing home facility. Patient apparently has been sick for a week. She had an outpatient x-ray which showed pneumonia. She has been treated with Rocephin for 7 days now. Today she went to dialysis and when she got back, patient seemed to be more disoriented than at baseline, not responding as she normally would. Patient was transferred here for further evaluation. Patient unable to provide much history but states she is not hurting anywhere.   ED Course:CXR calling CHF, bibasilar atelectasis. Patient with rales and O2 requirement via [place]. Tm 101.x WBC 22.2k. She is awake and talking, but disoriented / confused.       Assessment & Plan:   Principal Problem:   SIRS (systemic inflammatory response syndrome) (HCC) Active Problems:   Hypertension   DM (diabetes mellitus), type 2 with renal complications (HCC)   COPD (chronic obstructive pulmonary disease) (HCC)   Acute respiratory failure with hypoxia (HCC)   ESRD on hemodialysis (HCC)   Delirium  1] acute hypoxic respiratory failure due to healthcare associated pneumonia versus aspiration pneumonia along with fluid overload.  Antibiotics changed to meropenem she also has Enterobacter bacteremia.  Patient kept n.p.o. because of high aspiration risk followed by speech therapy.  Hopefully we can put her back on dysphagia diet at some point over getting a modified barium swallow and await speech recommendations.  Follow-up chest x-ray today.  2] ESRD dialysis Tuesday Thursday Saturdays.  3] type 2 diabetes on sliding scale  insulin for now since she is n.p.o.  4] Enterobacter bacteremia sensitive to imipenem.  Continue Carbapenem.  5] hypertension she is currently on Imdur 30 mg daily, lisinopril 40 mg daily, Lopressor 12.5 mg daily, and Procardia 60 mg 2 times a day.  Her blood pressure has been low to low normal .  Would decrease her lisinopril to 20 mg daily.  6] question PAD patient has diminished pulses in both Lower Extremities.  An ABI was unreliable due to noncompressibility bilaterally.  Would arrange for follow-up as an outpatient with the vascular surgeon prior to discharge.  7] worsening leukocytosis  in spite of appropriate antibiotics for Enterobacter bacteremia.  I think we should cover her for aspiration and start Unasyn.    DVT prophylaxis: Heparin Code Status: Full code Family Communication: We will call her daughter to discuss.  Disposition Plan: TBD  Consultants: Nephrology  Procedures: None Antimicrobials meropenem Subjective: Resting in bed in no acute distress was able to wake her up answer all my questions appropriately.  Still remains on nonrebreather mask.  Objective: Vitals:   09/28/17 0900 09/28/17 0930 09/28/17 1000 09/28/17 1030  BP: (!) 116/45 (!) 120/45 119/62 102/79  Pulse: (!) 105 93 98 (!) 106  Resp:      Temp:      TempSrc:      SpO2:      Weight:      Height:        Intake/Output Summary (Last 24 hours) at 09/28/2017 1101 Last data filed at 09/28/2017 0740 Gross per 24 hour  Intake 180 ml  Output -  Net 180 ml   Filed Weights   09/26/17 2135 09/27/17 0045 09/28/17 0805  Weight: 60.5  kg (133 lb 6.1 oz) 58.5 kg (128 lb 15.5 oz) 59.7 kg (131 lb 9.8 oz)    Examination:  General exam: Appears calm and comfortable  Respiratory system: Coarse to auscultation. Respiratory effort normal. Cardiovascular system: S1 & S2 heard, RRR. No JVD, murmurs, rubs, gallops or clicks. No pedal edema. Gastrointestinal system: Abdomen is nondistended, soft and nontender.  No organomegaly or masses felt. Normal bowel sounds heard. Central nervous system: Alert and oriented. No focal neurological deficits. Extremities: trace edema Skin: No rashes, lesions or ulcers    Data Reviewed: I have personally reviewed following labs and imaging studies  CBC: Recent Labs  Lab 09/25/17 1841 09/26/17 0304 09/28/17 0819  WBC 22.2* 11.9* 25.1*  NEUTROABS 19.8*  --   --   HGB 9.5* 9.6* 8.2*  HCT 31.6* 32.6* 27.6*  MCV 89.5 89.3 88.2  PLT 295 268 314   Basic Metabolic Panel: Recent Labs  Lab 09/25/17 1841 09/26/17 0304 09/28/17 0819  NA 138 141 137  K 5.0 4.0 3.8  CL 102 103 100  CO2 25 28 26   GLUCOSE 86 65* 125*  BUN 14 18 37*  CREATININE 3.83* 4.80* 5.80*  CALCIUM 7.6* 8.3* 8.7*  PHOS  --   --  2.4*   GFR: Estimated Creatinine Clearance: 6.1 mL/min (A) (by C-G formula based on SCr of 5.8 mg/dL (H)). Liver Function Tests: Recent Labs  Lab 09/25/17 1841 09/28/17 0819  AST 111*  --   ALT 45*  --   ALKPHOS 106  --   BILITOT 1.2  --   PROT 6.5  --   ALBUMIN 2.3* 2.1*   No results for input(s): LIPASE, AMYLASE in the last 168 hours. No results for input(s): AMMONIA in the last 168 hours. Coagulation Profile: No results for input(s): INR, PROTIME in the last 168 hours. Cardiac Enzymes: No results for input(s): CKTOTAL, CKMB, CKMBINDEX, TROPONINI in the last 168 hours. BNP (last 3 results) No results for input(s): PROBNP in the last 8760 hours. HbA1C: No results for input(s): HGBA1C in the last 72 hours. CBG: Recent Labs  Lab 09/27/17 0826 09/27/17 1140 09/27/17 1630 09/27/17 2202 09/28/17 0720  GLUCAP 124* 124* 118* 121* 117*   Lipid Profile: No results for input(s): CHOL, HDL, LDLCALC, TRIG, CHOLHDL, LDLDIRECT in the last 72 hours. Thyroid Function Tests: No results for input(s): TSH, T4TOTAL, FREET4, T3FREE, THYROIDAB in the last 72 hours. Anemia Panel: No results for input(s): VITAMINB12, FOLATE, FERRITIN, TIBC, IRON,  RETICCTPCT in the last 72 hours. Sepsis Labs: Recent Labs  Lab 09/25/17 1847  LATICACIDVEN 1.67    Recent Results (from the past 240 hour(s))  Blood culture (routine x 2)     Status: Abnormal   Collection Time: 09/25/17  6:41 PM  Result Value Ref Range Status   Specimen Description BLOOD RIGHT FOREARM  Final   Special Requests   Final    BOTTLES DRAWN AEROBIC AND ANAEROBIC Blood Culture adequate volume   Culture  Setup Time   Final    GRAM POSITIVE COCCI AEROBIC BOTTLE ONLY CRITICAL RESULT CALLED TO, READ BACK BY AND VERIFIED WITH: B. MANCHERIL, RPHARMD AT 1604 ON 09/26/17 BY C. JESSUP, MLT.    Culture (A)  Final    STAPHYLOCOCCUS SPECIES (COAGULASE NEGATIVE) THE SIGNIFICANCE OF ISOLATING THIS ORGANISM FROM A SINGLE SET OF BLOOD CULTURES WHEN MULTIPLE SETS ARE DRAWN IS UNCERTAIN. PLEASE NOTIFY THE MICROBIOLOGY DEPARTMENT WITHIN ONE WEEK IF SPECIATION AND SENSITIVITIES ARE REQUIRED. Performed at [facility], [address] [place], [place] [address]    Report Status 09/28/2017 FINAL  Final  Blood Culture ID Panel (Reflexed)     Status: Abnormal   Collection Time: 09/25/17  6:41 PM  Result Value Ref Range Status   Enterococcus species NOT DETECTED NOT DETECTED Final   Listeria monocytogenes NOT DETECTED NOT DETECTED Final   Staphylococcus species DETECTED (A) NOT DETECTED Final    Comment: Methicillin (oxacillin) resistant coagulase negative staphylococcus. Possible blood culture contaminant (unless isolated from more than one blood culture draw or clinical case suggests pathogenicity). No antibiotic treatment is indicated for blood  culture contaminants. CRITICAL RESULT CALLED TO, READ BACK BY AND VERIFIED WITH: B. MANCHERIL, RPHARMD AT 1604 ON 09/26/17 BY C. JESSUP, MLT.    Staphylococcus aureus NOT DETECTED NOT DETECTED Final   Methicillin resistance DETECTED (A) NOT DETECTED Final    Comment: CRITICAL RESULT CALLED TO, READ BACK BY AND VERIFIED WITH: B. MANCHERIL,  RPHARMD AT 1604 ON 09/26/17 BY C. JESSUP, MLT.    Streptococcus species NOT DETECTED NOT DETECTED Final   Streptococcus agalactiae NOT DETECTED NOT DETECTED Final   Streptococcus pneumoniae NOT DETECTED NOT DETECTED Final   Streptococcus pyogenes NOT DETECTED NOT DETECTED Final   Acinetobacter baumannii NOT DETECTED NOT DETECTED Final   Enterobacteriaceae species NOT DETECTED NOT DETECTED Final   Enterobacter cloacae complex NOT DETECTED NOT DETECTED Final   Escherichia coli NOT DETECTED NOT DETECTED Final   Klebsiella oxytoca NOT DETECTED NOT DETECTED Final   Klebsiella pneumoniae NOT DETECTED NOT DETECTED Final   Proteus species NOT DETECTED NOT DETECTED Final   Serratia marcescens NOT DETECTED NOT DETECTED Final   Haemophilus influenzae NOT DETECTED NOT DETECTED Final   Neisseria meningitidis NOT DETECTED NOT DETECTED Final   Pseudomonas aeruginosa NOT DETECTED NOT DETECTED Final   Candida albicans NOT DETECTED NOT DETECTED Final   Candida glabrata NOT DETECTED NOT DETECTED Final   Candida krusei NOT DETECTED NOT DETECTED Final   Candida parapsilosis NOT DETECTED NOT DETECTED Final   Candida tropicalis NOT DETECTED NOT DETECTED Final    Comment: Performed at [facility], [address], [place], [place] [address]  Blood culture (routine x 2)     Status: Abnormal   Collection Time: 09/25/17  6:57 PM  Result Value Ref Range Status   Specimen Description BLOOD RIGHT WRIST  Final   Special Requests   Final    BOTTLES DRAWN AEROBIC AND ANAEROBIC Blood Culture adequate volume   Culture  Setup Time   Final    GRAM NEGATIVE RODS AEROBIC BOTTLE ONLY CRITICAL RESULT CALLED TO, READ BACK BY AND VERIFIED WITH: B. MANCHERIL, RPHARMD AT 1250 ON 09/26/17 BY C. JESSUP, MLT. Performed at [facility], [address] [place], [place] [address]    Culture ACINETOBACTER BAUMANNII (A)  Final   Report Status 09/28/2017 FINAL  Final   Organism ID, Bacteria ACINETOBACTER BAUMANNII  Final        Susceptibility   Acinetobacter baumannii - MIC*    CEFTAZIDIME >=64 RESISTANT Resistant     CEFTRIAXONE >=64 RESISTANT Resistant     CIPROFLOXACIN >=4 RESISTANT Resistant     GENTAMICIN 4 SENSITIVE Sensitive     IMIPENEM 1 SENSITIVE Sensitive     PIP/TAZO >=128 RESISTANT Resistant     TRIMETH/SULFA <=20 SENSITIVE Sensitive     CEFEPIME >=64 RESISTANT Resistant     AMPICILLIN/SULBACTAM 4 SENSITIVE Sensitive     * ACINETOBACTER BAUMANNII  Blood Culture ID Panel (  Reflexed)     Status: Abnormal   Collection Time: 09/25/17  6:57 PM  Result Value Ref Range Status   Enterococcus species NOT DETECTED NOT DETECTED Final   Listeria monocytogenes NOT DETECTED NOT DETECTED Final   Staphylococcus species NOT DETECTED NOT DETECTED Final   Staphylococcus aureus NOT DETECTED NOT DETECTED Final   Streptococcus species NOT DETECTED NOT DETECTED Final   Streptococcus agalactiae NOT DETECTED NOT DETECTED Final   Streptococcus pneumoniae NOT DETECTED NOT DETECTED Final   Streptococcus pyogenes NOT DETECTED NOT DETECTED Final   Acinetobacter baumannii DETECTED (A) NOT DETECTED Final    Comment: CRITICAL RESULT CALLED TO, READ BACK BY AND VERIFIED WITH: B. MANCHERIL, RPHARMD AT 1250 ON 09/26/17 BY C. JESSUP, MLT.    Enterobacteriaceae species NOT DETECTED NOT DETECTED Final   Enterobacter cloacae complex NOT DETECTED NOT DETECTED Final   Escherichia coli NOT DETECTED NOT DETECTED Final   Klebsiella oxytoca NOT DETECTED NOT DETECTED Final   Klebsiella pneumoniae NOT DETECTED NOT DETECTED Final   Proteus species NOT DETECTED NOT DETECTED Final   Serratia marcescens NOT DETECTED NOT DETECTED Final   Carbapenem resistance NOT DETECTED NOT DETECTED Final   Haemophilus influenzae NOT DETECTED NOT DETECTED Final   Neisseria meningitidis NOT DETECTED NOT DETECTED Final   Pseudomonas aeruginosa NOT DETECTED NOT DETECTED Final   Candida albicans NOT DETECTED NOT DETECTED Final   Candida glabrata NOT  DETECTED NOT DETECTED Final   Candida krusei NOT DETECTED NOT DETECTED Final   Candida parapsilosis NOT DETECTED NOT DETECTED Final   Candida tropicalis NOT DETECTED NOT DETECTED Final    Comment: Performed at [facility], [address], [place], [place] [address]  MRSA PCR Screening     Status: None   Collection Time: 09/26/17 12:53 AM  Result Value Ref Range Status   MRSA by PCR NEGATIVE NEGATIVE Final    Comment:        The GeneXpert MRSA Assay (FDA approved for NASAL specimens only), is one component of a comprehensive MRSA colonization surveillance program. It is not intended to diagnose MRSA infection nor to guide or monitor treatment for MRSA infections. Performed at [facility], [address], [place], [place] [address]   C difficile quick scan w PCR reflex     Status: None   Collection Time: 09/26/17  2:31 PM  Result Value Ref Range Status   C Diff antigen NEGATIVE NEGATIVE Final   C Diff toxin NEGATIVE NEGATIVE Final   C Diff interpretation No C. difficile detected.  Final    Comment: Performed at [facility], [address], [place], [place] [address]         Radiology Studies: No results found.      Scheduled Meds: . aspirin EC  81 mg Oral Daily  . Chlorhexidine Gluconate Cloth  6 each Topical Q0600  . Chlorhexidine Gluconate Cloth  6 each Topical Q0600  . doxercalciferol  2.5 mcg Oral Q T,Th,Sa-HD  . feeding supplement (NEPRO CARB STEADY)  237 mL Oral BID BM  . fluticasone furoate-vilanterol  1 puff Inhalation Daily  . heparin  5,000 Units Subcutaneous Q8H  . insulin aspart  0-9 Units Subcutaneous TID WC  . isosorbide mononitrate  30 mg Oral QPM  . lisinopril  40 mg Oral QPM  . mouth rinse  15 mL Mouth Rinse BID  . metoprolol tartrate  12.5 mg Oral Daily  . NIFEdipine  60 mg Oral BID  . pravastatin  40 mg Oral Daily  . umeclidinium bromide  1 puff Inhalation Daily   Continuous Infusions: . sodium chloride    . sodium  chloride    . meropenem (MERREM) IV 500 mg (09/27/17 2106)     LOS: 3 days     Alwyn RenElizabeth G Lillee Mooneyhan, MD [facility]  If 7PM-7AM, please contact night-coverage [email or web address] Password [email or web address] 09/28/2017, 11:01 AM

## 2017-09-28 NOTE — Progress Notes (Signed)
Pharmacy Antibiotic Note  Nicole Cox is a 84 y.o. female admitted on 09/25/2017 with sepsis, found to have bacteremia and also suspicious for aspiration PNA. Patient to transition from meropenem to Unasyn.  Patient is ESRD on HD TTSat- currently in HD. WBC increased despite appropriate treatment.  Plan: Unasyn 1.5g IV q12h Follow HD schedule/tolerance, clinical progression, LOT  Height: 5' 2.99" (160 cm) Weight: 131 lb 9.8 oz (59.7 kg) IBW/kg (Calculated) : 52.38  Temp (24hrs), Avg:98.4 F (36.9 C), Min:98.1 F (36.7 C), Max:98.8 F (37.1 C)  Recent Labs  Lab 09/25/17 1841 09/25/17 1847 09/26/17 0304 09/28/17 0819  WBC 22.2*  --  11.9* 25.1*  CREATININE 3.83*  --  4.80* 5.80*  LATICACIDVEN  --  1.67  --   --     Estimated Creatinine Clearance: 6.1 mL/min (A) (by C-G formula based on SCr of 5.8 mg/dL (H)).    No Known Allergies  Antimicrobials this admission: Vancomycin 7/13 >> 7/14 Zosyn 7/13 >> 7/14 Meropenem 7/14>>7/16 Unasyn 7/16>>  Dose adjustments this admission: N/A  Microbiology results: 7/13 BCx: 1/2 Acinetobacter baumannii (sens to gentamicin, imipenem, Bctrim, and Unasyn); 1/2 CoNS 7/14 MRSA PCR: negative 7/14 CDiff: neg   Remo Kirschenmann D. Debar Plate, PharmD, BCPS Clinical Pharmacist [phone] Please check AMION for all [facility] numbers 09/28/2017 11:29 AM

## 2017-09-29 DIAGNOSIS — R0902 Hypoxemia: Secondary | ICD-10-CM

## 2017-09-29 DIAGNOSIS — A419 Sepsis, unspecified organism: Secondary | ICD-10-CM

## 2017-09-29 DIAGNOSIS — R410 Disorientation, unspecified: Secondary | ICD-10-CM

## 2017-09-29 DIAGNOSIS — J449 Chronic obstructive pulmonary disease, unspecified: Secondary | ICD-10-CM

## 2017-09-29 DIAGNOSIS — E1121 Type 2 diabetes mellitus with diabetic nephropathy: Secondary | ICD-10-CM

## 2017-09-29 DIAGNOSIS — I10 Essential (primary) hypertension: Secondary | ICD-10-CM

## 2017-09-29 DIAGNOSIS — J9601 Acute respiratory failure with hypoxia: Secondary | ICD-10-CM

## 2017-09-29 LAB — CBC WITH DIFFERENTIAL/PLATELET
Abs Immature Granulocytes: 1 10*3/uL — ABNORMAL HIGH (ref 0.0–0.1)
Basophils Absolute: 0.1 10*3/uL (ref 0.0–0.1)
Basophils Relative: 0 %
Eosinophils Absolute: 0.5 10*3/uL (ref 0.0–0.7)
Eosinophils Relative: 2 %
HCT: 28.9 % — ABNORMAL LOW (ref 36.0–46.0)
Hemoglobin: 8.7 g/dL — ABNORMAL LOW (ref 12.0–15.0)
Immature Granulocytes: 5 %
Lymphocytes Relative: 10 %
Lymphs Abs: 2.2 10*3/uL (ref 0.7–4.0)
MCH: 26.2 pg (ref 26.0–34.0)
MCHC: 30.1 g/dL (ref 30.0–36.0)
MCV: 87 fL (ref 78.0–100.0)
Monocytes Absolute: 1.5 10*3/uL — ABNORMAL HIGH (ref 0.1–1.0)
Monocytes Relative: 7 %
Neutro Abs: 16.6 10*3/uL — ABNORMAL HIGH (ref 1.7–7.7)
Neutrophils Relative %: 76 %
Platelets: 334 10*3/uL (ref 150–400)
RBC: 3.32 MIL/uL — ABNORMAL LOW (ref 3.87–5.11)
RDW: 19.2 % — ABNORMAL HIGH (ref 11.5–15.5)
WBC: 21.9 10*3/uL — ABNORMAL HIGH (ref 4.0–10.5)

## 2017-09-29 LAB — GLUCOSE, CAPILLARY
GLUCOSE-CAPILLARY: 100 mg/dL — AB (ref 70–99)
GLUCOSE-CAPILLARY: 133 mg/dL — AB (ref 70–99)
GLUCOSE-CAPILLARY: 101 mg/dL — AB (ref 70–99)
Glucose-Capillary: 126 mg/dL — ABNORMAL HIGH (ref 70–99)

## 2017-09-29 MED ORDER — CHLORHEXIDINE GLUCONATE CLOTH 2 % EX PADS
6.0000 | MEDICATED_PAD | Freq: Every day | CUTANEOUS | Status: DC
  Administered 2017-09-29 – 2017-10-02 (×3): 6 via TOPICAL

## 2017-09-29 NOTE — Progress Notes (Signed)
Dialysis:[facility] Dykes[facility]ekan AV graft, heparin 2000   Impression: 1High fever/ ^^wbc/ pulm infiltrates/ +acinetobacter bacteremia -  2 ESRD TTS HD. Had -3L yesterday on HD.  For HD again Thurs 3 HTN - on mult bp meds at home., BP stable here 4 DM on insulin 5 COPD / Hist of recurrent PNA 6 H/o CVA  Subjective: Interval History: Tolerated HD yesterday, breathing better, weight is down significantly  Objective: Vital signs in last 24 hours: Temp:  [98.2 F (36.8 C)-99.2 F (37.3 C)] 98.4 F (36.9 C) (07/17 0328) Pulse Rate:  [86-110] 89 (07/17 0328) Resp:  [14-22] 14 (07/17 0328) BP: (102-145)/(29-83) 127/29 (07/17 0328) SpO2:  [95 %-100 %] 95 % (07/17 0328) Weight:  [56.3 kg (124 lb 1.9 oz)-59.7 kg (131 lb 9.8 oz)] 56.3 kg (124 lb 1.9 oz) (07/16 1211) Weight change:   Intake/Output from previous day: 07/16 0701 - 07/17 0700 In: 480 [P.O.:60; NG/GT:120; IV Piggyback:300] Out: 3000  Intake/Output this shift: No intake/output data recorded.  General appearance: alert and cooperative Resp: rhonchi bilaterally Cardio: regular rate and rhythm, S1, S2 normal, no murmur, click, rub or gallop Extremities: extremities normal, atraumatic, no cyanosis or edema and AVA LUE  Lab Results: Recent Labs    09/28/17 0819 09/29/17 0330  WBC 25.1* 21.9*  HGB 8.2* 8.7*  HCT 27.6* 28.9*  PLT 314 334   BMET:  Recent Labs    09/28/17 0819  NA 137  K 3.8  CL 100  CO2 26  GLUCOSE 125*  BUN 37*  CREATININE 5.80*  CALCIUM 8.7*   No results for input(s): PTH in the last 72 hours. Iron Studies: No results for input(s): IRON, TIBC, TRANSFERRIN, FERRITIN in the last 72 hours. Studies/Results: Dg Chest 1 View  Result Date: 09/28/2017 CLINICAL DATA:  Hypoxia. EXAM: CHEST  1 VIEW COMPARISON:  09/26/2017. FINDINGS: Cardiomegaly with diffuse bilateral pulmonary interstitial prominence consistent with pulmonary interstitial edema and/or pneumonitis. A component these changes may  be chronic. Exam unchanged from prior exam. Right base calcified pleural plaque is unchanged. No pneumothorax. IMPRESSION: Cardiomegaly. Diffuse bilateral from interstitial prominence is unchanged from prior exam as described above. Electronically Signed   By: Maisie Fushomas  Register   On: 09/28/2017 15:55    Scheduled: . aspirin EC  81 mg Oral Daily  . Chlorhexidine Gluconate Cloth  6 each Topical Q0600  . Chlorhexidine Gluconate Cloth  6 each Topical Q0600  . doxercalciferol  2.5 mcg Oral Q T,Th,Sa-HD  . fluticasone furoate-vilanterol  1 puff Inhalation Daily  . heparin  5,000 Units Subcutaneous Q8H  . insulin aspart  0-9 Units Subcutaneous TID WC  . isosorbide mononitrate  30 mg Oral QPM  . lisinopril  20 mg Oral QPM  . mouth rinse  15 mL Mouth Rinse BID  . metoprolol tartrate  12.5 mg Oral Daily  . NIFEdipine  60 mg Oral BID  . pravastatin  40 mg Oral Daily  . umeclidinium bromide  1 puff Inhalation Daily     LOS: 4 days   Lauris PoagAlvin C Yarethzi Branan 09/29/2017,7:49 AM

## 2017-09-29 NOTE — Care Management Important Message (Signed)
Important Message  Patient Details  Name: Al DecantLizzie Tadros MRN: [id number] Date of Birth: 01/07/1932   Medicare Important Message Given:  Yes    Ronda Rajkumar Stefan ChurchBratton 09/29/2017, 2:50 PM

## 2017-09-29 NOTE — Progress Notes (Signed)
Attempted high-flow nasal cannula at 10L. Patient continues to [place]-breathe, despite coaching from Clinical research associatewriter and family. Her saturation dropped to 90%. Patient placed back on Venturi mask at previous rate: 8L, 40% FiO2 and sat recovered to 96%.

## 2017-09-29 NOTE — Progress Notes (Signed)
PROGRESS NOTE  Nicole DecantLizzie Cox JXB:[id number] DOB: [birth year] DOA: 09/25/2017 PCP: [name], J Stewart, MD  HPI/Recap of past 24 hours: 84 y.o.femalewith medical history significant ofcongestive heart failure, coronary artery disease, COPD, diabetes, hypertension, end-stage renal disease on dialysis, CVA, presents to emergency department with altered level of consciousness and shortness of breath. Patient is from nursing home facility. Patient apparently has been sick for a week. She had an outpatient x-ray which showed pneumonia. She has been treated with Rocephin for 7 days now. Today she went to dialysis and when she got back, patient seemed to be more disoriented than at baseline, not responding as she normally would. Patient was transferred here for further evaluation.   09/29/2017: Patient seen and examined at her bedside.  She is alert and oriented x3.  She is on a Ventimask with no sign of distress.  She denies any chest pain or dyspnea while on the mask.  Personally reviewed chest x-ray done on admission which revealed bilateral pulmonary infiltrates with suspicion for multifocal pneumonia.  2D echo done on 08/02/2017 revealed LVEF 55 to 60%.    Assessment/Plan: Principal Problem:   SIRS (systemic inflammatory response syndrome) (HCC) Active Problems:   Hypertension   DM (diabetes mellitus), type 2 with renal complications (HCC)   COPD (chronic obstructive pulmonary disease) (HCC)   Acute respiratory failure with hypoxia (HCC)   ESRD on hemodialysis (HCC)   Delirium  Acute metabolic encephalopathy most likely secondary to sepsis from multifocal HCAP, Acetobacter bacteremia Encephalopathy appears to have resolved Alert and oriented x3 today Reorient as needed Fall precautions  Acute hypoxic respiratory failure secondary to multifocal HCAP, persistent Continue Ventimask to maintain O2 saturation Greater than 92% Last ABG done on 09/26/2017 revealed hypoxia with normal  pH  Sepsis secondary to Citrobacter bacteremia, persistent Wbc 21 K, RR 25 Positive cultures x2 from 09/25/2017 Continue IV Unasyn Repeat blood cultures peripherally x2 on 09/29/2017 Monitor fever curve Monitor WBC Obtain CBC in the morning  Sepsis secondary to multifocal HCAP with suspected aspiration pneumonia, persistent Leukocytosis still present Tachypnea with respiration rate 25 Continue IV Unasyn Continue breathing treatments  Severe protein calorie malnutrition Albumin 2.1 N.p.o. due to dysphagia  Dysphagia, persistent Speech therapy evaluated and recommended n.p.o.  Chronic diastolic CHF Last 2D echo done on 08/02/2017 revealed LVEF 55 to 60% BNP greater than 2000 Nephrology following for dialysis to remove excess fluid  End-stage renal disease on dialysisTuesday Thursday Saturday Next dialysis planned on Thursday, 09/30/2017 Nephrology following  Hypertension Stable Continue home medications   Code Status: Full code  Family Communication: None at bedside  Disposition Plan: Home in 2 to 3 days with nephrology signs of   Consultants:  Nephrology  Procedures:  Hemodialysis  Antimicrobials: Unasyn  DVT prophylaxis: Subcu heparin 3 times daily   Objective: Vitals:   09/29/17 0749 09/29/17 0807 09/29/17 0839 09/29/17 1156  BP:  (!) 127/43 (!) 132/33 (!) 125/26  Pulse:  88 93 85  Resp:  15  15  Temp:  98.3 F (36.8 C)  98.3 F (36.8 C)  TempSrc:  Oral  Oral  SpO2: 96% 100%  96%  Weight:      Height:        Intake/Output Summary (Last 24 hours) at 09/29/2017 1310 Last data filed at 09/29/2017 0842 Gross per 24 hour  Intake 300 ml  Output 0 ml  Net 300 ml   Filed Weights   09/27/17 0045 09/28/17 0805 09/28/17 1211  Weight: 58.5 kg (128 lb  15.5 oz) 59.7 kg (131 lb 9.8 oz) 56.3 kg (124 lb 1.9 oz)    Exam:  . General: 84 y.o. year-old female frail in no acute distress on Ventimask.  Alert oriented x3. . Cardiovascular: Regular rate and  rhythm with no rubs or gallops.  No thyromegaly or JVD noted.   Marland Kitchen Respiratory: Diffuse rales noted bilaterally.  Poor inspiratory effort. . Abdomen: Soft nontender nondistended with normal bowel sounds x4 quadrants. . Psychiatry: Mood is appropriate for condition and setting   Data Reviewed: CBC: Recent Labs  Lab 09/25/17 1841 09/26/17 0304 09/28/17 0819 09/29/17 0330  WBC 22.2* 11.9* 25.1* 21.9*  NEUTROABS 19.8*  --   --  16.6*  HGB 9.5* 9.6* 8.2* 8.7*  HCT 31.6* 32.6* 27.6* 28.9*  MCV 89.5 89.3 88.2 87.0  PLT 295 268 314 334   Basic Metabolic Panel: Recent Labs  Lab 09/25/17 1841 09/26/17 0304 09/28/17 0819  NA 138 141 137  K 5.0 4.0 3.8  CL 102 103 100  CO2 25 28 26   GLUCOSE 86 65* 125*  BUN 14 18 37*  CREATININE 3.83* 4.80* 5.80*  CALCIUM 7.6* 8.3* 8.7*  PHOS  --   --  2.4*   GFR: Estimated Creatinine Clearance: 6.1 mL/min (A) (by C-G formula based on SCr of 5.8 mg/dL (H)). Liver Function Tests: Recent Labs  Lab 09/25/17 1841 09/28/17 0819  AST 111*  --   ALT 45*  --   ALKPHOS 106  --   BILITOT 1.2  --   PROT 6.5  --   ALBUMIN 2.3* 2.1*   No results for input(s): LIPASE, AMYLASE in the last 168 hours. No results for input(s): AMMONIA in the last 168 hours. Coagulation Profile: No results for input(s): INR, PROTIME in the last 168 hours. Cardiac Enzymes: No results for input(s): CKTOTAL, CKMB, CKMBINDEX, TROPONINI in the last 168 hours. BNP (last 3 results) No results for input(s): PROBNP in the last 8760 hours. HbA1C: No results for input(s): HGBA1C in the last 72 hours. CBG: Recent Labs  Lab 09/28/17 1242 09/28/17 1742 09/28/17 2122 09/29/17 0733 09/29/17 1153  GLUCAP 88 129* 109* 126* 100*   Lipid Profile: No results for input(s): CHOL, HDL, LDLCALC, TRIG, CHOLHDL, LDLDIRECT in the last 72 hours. Thyroid Function Tests: No results for input(s): TSH, T4TOTAL, FREET4, T3FREE, THYROIDAB in the last 72 hours. Anemia Panel: No results for  input(s): VITAMINB12, FOLATE, FERRITIN, TIBC, IRON, RETICCTPCT in the last 72 hours. Urine analysis:    Component Value Date/Time   COLORURINE YELLOW 08/11/2012 1917   APPEARANCEUR CLOUDY (A) 08/11/2012 1917   LABSPEC 1.013 08/11/2012 1917   PHURINE 7.5 08/11/2012 1917   GLUCOSEU 100 (A) 08/11/2012 1917   HGBUR TRACE (A) 08/11/2012 1917   BILIRUBINUR NEGATIVE 08/11/2012 1917   KETONESUR NEGATIVE 08/11/2012 1917   PROTEINUR >300 (A) 08/11/2012 1917   UROBILINOGEN 0.2 08/11/2012 1917   NITRITE NEGATIVE 08/11/2012 1917   LEUKOCYTESUR NEGATIVE 08/11/2012 1917   Sepsis Labs: @LABRCNTIP (procalcitonin:4,lacticidven:4)  ) Recent Results (from the past 240 hour(s))  Blood culture (routine x 2)     Status: Abnormal   Collection Time: 09/25/17  6:41 PM  Result Value Ref Range Status   Specimen Description BLOOD RIGHT FOREARM  Final   Special Requests   Final    BOTTLES DRAWN AEROBIC AND ANAEROBIC Blood Culture adequate volume   Culture  Setup Time   Final    GRAM POSITIVE COCCI AEROBIC BOTTLE ONLY CRITICAL RESULT CALLED TO, READ BACK BY  AND VERIFIED WITH: B. MANCHERIL, RPHARMD AT 1604 ON 09/26/17 BY C. JESSUP, MLT.    Culture (A)  Final    STAPHYLOCOCCUS SPECIES (COAGULASE NEGATIVE) THE SIGNIFICANCE OF ISOLATING THIS ORGANISM FROM A SINGLE SET OF BLOOD CULTURES WHEN MULTIPLE SETS ARE DRAWN IS UNCERTAIN. PLEASE NOTIFY THE MICROBIOLOGY DEPARTMENT WITHIN ONE WEEK IF SPECIATION AND SENSITIVITIES ARE REQUIRED. Performed at [facility], [address], [place], [place] [address]    Report Status 09/28/2017 FINAL  Final  Blood Culture ID Panel (Reflexed)     Status: Abnormal   Collection Time: 09/25/17  6:41 PM  Result Value Ref Range Status   Enterococcus species NOT DETECTED NOT DETECTED Final   Listeria monocytogenes NOT DETECTED NOT DETECTED Final   Staphylococcus species DETECTED (A) NOT DETECTED Final    Comment: Methicillin (oxacillin) resistant coagulase negative  staphylococcus. Possible blood culture contaminant (unless isolated from more than one blood culture draw or clinical case suggests pathogenicity). No antibiotic treatment is indicated for blood  culture contaminants. CRITICAL RESULT CALLED TO, READ BACK BY AND VERIFIED WITH: B. MANCHERIL, RPHARMD AT 1604 ON 09/26/17 BY C. JESSUP, MLT.    Staphylococcus aureus NOT DETECTED NOT DETECTED Final   Methicillin resistance DETECTED (A) NOT DETECTED Final    Comment: CRITICAL RESULT CALLED TO, READ BACK BY AND VERIFIED WITH: B. MANCHERIL, RPHARMD AT 1604 ON 09/26/17 BY C. JESSUP, MLT.    Streptococcus species NOT DETECTED NOT DETECTED Final   Streptococcus agalactiae NOT DETECTED NOT DETECTED Final   Streptococcus pneumoniae NOT DETECTED NOT DETECTED Final   Streptococcus pyogenes NOT DETECTED NOT DETECTED Final   Acinetobacter baumannii NOT DETECTED NOT DETECTED Final   Enterobacteriaceae species NOT DETECTED NOT DETECTED Final   Enterobacter cloacae complex NOT DETECTED NOT DETECTED Final   Escherichia coli NOT DETECTED NOT DETECTED Final   Klebsiella oxytoca NOT DETECTED NOT DETECTED Final   Klebsiella pneumoniae NOT DETECTED NOT DETECTED Final   Proteus species NOT DETECTED NOT DETECTED Final   Serratia marcescens NOT DETECTED NOT DETECTED Final   Haemophilus influenzae NOT DETECTED NOT DETECTED Final   Neisseria meningitidis NOT DETECTED NOT DETECTED Final   Pseudomonas aeruginosa NOT DETECTED NOT DETECTED Final   Candida albicans NOT DETECTED NOT DETECTED Final   Candida glabrata NOT DETECTED NOT DETECTED Final   Candida krusei NOT DETECTED NOT DETECTED Final   Candida parapsilosis NOT DETECTED NOT DETECTED Final   Candida tropicalis NOT DETECTED NOT DETECTED Final    Comment: Performed at [facility], [address] [place], [place] [address]  Blood culture (routine x 2)     Status: Abnormal   Collection Time: 09/25/17  6:57 PM  Result Value Ref Range Status   Specimen  Description BLOOD RIGHT WRIST  Final   Special Requests   Final    BOTTLES DRAWN AEROBIC AND ANAEROBIC Blood Culture adequate volume   Culture  Setup Time   Final    GRAM NEGATIVE RODS AEROBIC BOTTLE ONLY CRITICAL RESULT CALLED TO, READ BACK BY AND VERIFIED WITH: B. MANCHERIL, RPHARMD AT 1250 ON 09/26/17 BY C. JESSUP, MLT. Performed at [facility], [address] [place], [place] [address]    Culture ACINETOBACTER BAUMANNII (A)  Final   Report Status 09/28/2017 FINAL  Final   Organism ID, Bacteria ACINETOBACTER BAUMANNII  Final      Susceptibility   Acinetobacter baumannii - MIC*    CEFTAZIDIME >=64 RESISTANT Resistant     CEFTRIAXONE >=64 RESISTANT Resistant  CIPROFLOXACIN >=4 RESISTANT Resistant     GENTAMICIN 4 SENSITIVE Sensitive     IMIPENEM 1 SENSITIVE Sensitive     PIP/TAZO >=128 RESISTANT Resistant     TRIMETH/SULFA <=20 SENSITIVE Sensitive     CEFEPIME >=64 RESISTANT Resistant     AMPICILLIN/SULBACTAM 4 SENSITIVE Sensitive     * ACINETOBACTER BAUMANNII  Blood Culture ID Panel (Reflexed)     Status: Abnormal   Collection Time: 09/25/17  6:57 PM  Result Value Ref Range Status   Enterococcus species NOT DETECTED NOT DETECTED Final   Listeria monocytogenes NOT DETECTED NOT DETECTED Final   Staphylococcus species NOT DETECTED NOT DETECTED Final   Staphylococcus aureus NOT DETECTED NOT DETECTED Final   Streptococcus species NOT DETECTED NOT DETECTED Final   Streptococcus agalactiae NOT DETECTED NOT DETECTED Final   Streptococcus pneumoniae NOT DETECTED NOT DETECTED Final   Streptococcus pyogenes NOT DETECTED NOT DETECTED Final   Acinetobacter baumannii DETECTED (A) NOT DETECTED Final    Comment: CRITICAL RESULT CALLED TO, READ BACK BY AND VERIFIED WITH: B. MANCHERIL, RPHARMD AT 1250 ON 09/26/17 BY C. JESSUP, MLT.    Enterobacteriaceae species NOT DETECTED NOT DETECTED Final   Enterobacter cloacae complex NOT DETECTED NOT DETECTED Final   Escherichia coli NOT  DETECTED NOT DETECTED Final   Klebsiella oxytoca NOT DETECTED NOT DETECTED Final   Klebsiella pneumoniae NOT DETECTED NOT DETECTED Final   Proteus species NOT DETECTED NOT DETECTED Final   Serratia marcescens NOT DETECTED NOT DETECTED Final   Carbapenem resistance NOT DETECTED NOT DETECTED Final   Haemophilus influenzae NOT DETECTED NOT DETECTED Final   Neisseria meningitidis NOT DETECTED NOT DETECTED Final   Pseudomonas aeruginosa NOT DETECTED NOT DETECTED Final   Candida albicans NOT DETECTED NOT DETECTED Final   Candida glabrata NOT DETECTED NOT DETECTED Final   Candida krusei NOT DETECTED NOT DETECTED Final   Candida parapsilosis NOT DETECTED NOT DETECTED Final   Candida tropicalis NOT DETECTED NOT DETECTED Final    Comment: Performed at [facility], [address] [place], [place] [address]  MRSA PCR Screening     Status: None   Collection Time: 09/26/17 12:53 AM  Result Value Ref Range Status   MRSA by PCR NEGATIVE NEGATIVE Final    Comment:        The GeneXpert MRSA Assay (FDA approved for NASAL specimens only), is one component of a comprehensive MRSA colonization surveillance program. It is not intended to diagnose MRSA infection nor to guide or monitor treatment for MRSA infections. Performed at [facility], [address], [place], [place] [address]   C difficile quick scan w PCR reflex     Status: None   Collection Time: 09/26/17  2:31 PM  Result Value Ref Range Status   C Diff antigen NEGATIVE NEGATIVE Final   C Diff toxin NEGATIVE NEGATIVE Final   C Diff interpretation No C. difficile detected.  Final    Comment: Performed at [facility], [address], [place], [place] [address]      Studies: Dg Chest 1 View  Result Date: 09/28/2017 CLINICAL DATA:  Hypoxia. EXAM: CHEST  1 VIEW COMPARISON:  09/26/2017. FINDINGS: Cardiomegaly with diffuse bilateral pulmonary interstitial prominence consistent with pulmonary interstitial edema and/or  pneumonitis. A component these changes may be chronic. Exam unchanged from prior exam. Right base calcified pleural plaque is unchanged. No pneumothorax. IMPRESSION: Cardiomegaly. Diffuse bilateral from interstitial prominence is unchanged from prior exam as described above. Electronically Signed  ByMaisie Fus  Register   On: 09/28/2017 15:55    Scheduled Meds: . aspirin EC  81 mg Oral Daily  . Chlorhexidine Gluconate Cloth  6 each Topical Q0600  . doxercalciferol  2.5 mcg Oral Q T,Th,Sa-HD  . fluticasone furoate-vilanterol  1 puff Inhalation Daily  . heparin  5,000 Units Subcutaneous Q8H  . insulin aspart  0-9 Units Subcutaneous TID WC  . isosorbide mononitrate  30 mg Oral QPM  . lisinopril  20 mg Oral QPM  . mouth rinse  15 mL Mouth Rinse BID  . metoprolol tartrate  12.5 mg Oral Daily  . NIFEdipine  60 mg Oral BID  . pravastatin  40 mg Oral Daily  . umeclidinium bromide  1 puff Inhalation Daily    Continuous Infusions: . ampicillin-sulbactam (UNASYN) IV 1.5 g (09/29/17 0307)     LOS: 4 days     Darlin Drop, MD [facility] Pager [phone]  If 7PM-7AM, please contact night-coverage [email or web address] Password [email or web address] 09/29/2017, 1:10 PM

## 2017-09-29 NOTE — Plan of Care (Signed)
Plan of care was discussed with patient.  Patient was often confused and could not provide any teach back.

## 2017-09-29 NOTE — Clinical Social Work Note (Signed)
Clinical Social Work Assessment  Patient Details  Name: Nicole Cox MRN: [id number] Date of Birth: [birth year]  Date of referral:  09/29/17               Reason for consult:  Facility Placement                Permission sought to share information with:  Facility Medical sales representativeContact Representative, Family Supports Permission granted to share information::  Yes, Release of Information Signed(pt disoriented spoke with HCPOA)  Name::     Nicole Cox  Agency::  [facility]  Relationship::  dtr  Contact Information:     Housing/Transportation Living arrangements for the past 2 months:  Skilled Building surveyorursing Facility Source of Information:  Adult Children, Chief Technology Officerower of Attorney Patient Interpreter Needed:  None Criminal Activity/Legal Involvement Pertinent to Current Situation/Hospitalization:  No - Comment as needed Significant Relationships:  Adult Children Lives with:  Facility Resident Do you feel safe going back to the place where you live?  Yes Need for family participation in patient care:  Yes (Comment)(decision making)  Care giving concerns:  [facility] has [facility]- no concerns with care at this time.   Social Worker assessment / plan:  CSW spoke with pt dtr/HCPOA regarding plans for time of DC.  Dtr confirms that pt went to SNF a couple of months ago for short term rehab but that she has stayed much longer than hoped.  Employment status:    Insurance information:    PT Recommendations:    Information / Referral to community resources:  Skilled Nursing Facility  Patient/Family's Response to care:  Agreeable to pt return to SNF- understands she could not care for pt at home.  Patient/Family's Understanding of and Emotional Response to Diagnosis, Current Treatment, and Prognosis:  No questions or concerns- hopeful pt will start to show improvement so she can return to rehab soon.  Emotional Assessment Appearance:  Appears stated age Attitude/Demeanor/Rapport:  Unable to  Assess Affect (typically observed):  Unable to Assess Orientation:  Oriented to Self, Oriented to Place, Oriented to  Time, Oriented to Situation Alcohol / Substance use:  Not Applicable Psych involvement (Current and /or in the community):  No (Comment)  Discharge Needs  Concerns to be addressed:  Care Coordination Readmission within the last 30 days:  No Current discharge risk:  Physical Impairment Barriers to Discharge:  Continued Medical Work up   Burna SisUris, Johnathon Olden H, LCSW 09/29/2017, 9:56 AM

## 2017-09-30 ENCOUNTER — Inpatient Hospital Stay (HOSPITAL_COMMUNITY): Payer: Medicare HMO

## 2017-09-30 DIAGNOSIS — N186 End stage renal disease: Secondary | ICD-10-CM

## 2017-09-30 DIAGNOSIS — Z992 Dependence on renal dialysis: Secondary | ICD-10-CM

## 2017-09-30 LAB — CBC
HEMATOCRIT: 27.8 % — AB (ref 36.0–46.0)
HEMOGLOBIN: 8.1 g/dL — AB (ref 12.0–15.0)
MCH: 26.1 pg (ref 26.0–34.0)
MCHC: 29.1 g/dL — ABNORMAL LOW (ref 30.0–36.0)
MCV: 89.7 fL (ref 78.0–100.0)
Platelets: 328 10*3/uL (ref 150–400)
RBC: 3.1 MIL/uL — ABNORMAL LOW (ref 3.87–5.11)
RDW: 19.9 % — ABNORMAL HIGH (ref 11.5–15.5)
WBC: 20 10*3/uL — ABNORMAL HIGH (ref 4.0–10.5)

## 2017-09-30 LAB — RENAL FUNCTION PANEL
ALBUMIN: 2.2 g/dL — AB (ref 3.5–5.0)
Anion gap: 14 (ref 5–15)
BUN: 32 mg/dL — AB (ref 8–23)
CHLORIDE: 100 mmol/L (ref 98–111)
CO2: 29 mmol/L (ref 22–32)
CREATININE: 5.74 mg/dL — AB (ref 0.44–1.00)
Calcium: 8.8 mg/dL — ABNORMAL LOW (ref 8.9–10.3)
GFR calc Af Amer: 7 mL/min — ABNORMAL LOW (ref >60)
GFR, EST NON AFRICAN AMERICAN: 6 mL/min — AB (ref >60)
GLUCOSE: 131 mg/dL — AB (ref 70–99)
Phosphorus: 2.8 mg/dL (ref 2.5–4.6)
Potassium: 4.2 mmol/L (ref 3.5–5.1)
Sodium: 143 mmol/L (ref 135–145)

## 2017-09-30 LAB — GLUCOSE, CAPILLARY
GLUCOSE-CAPILLARY: 87 mg/dL (ref 70–99)
GLUCOSE-CAPILLARY: 135 mg/dL — AB (ref 70–99)
Glucose-Capillary: 261 mg/dL — ABNORMAL HIGH (ref 70–99)

## 2017-09-30 MED ORDER — RESOURCE THICKENUP CLEAR PO POWD
ORAL | Status: DC | PRN
  Filled 2017-09-30: qty 125

## 2017-09-30 MED ORDER — TRAMADOL HCL 50 MG PO TABS
ORAL_TABLET | ORAL | Status: AC
  Filled 2017-09-30: qty 1

## 2017-09-30 NOTE — Progress Notes (Signed)
Pharmacy Antibiotic Note  Nicole Cox is a 84 y.o. female admitted on 09/25/2017 with sepsis, found to have bacteremia and also suspicious for aspiration PNA. Pharmacy consulted for Unasyn dosing.   Patient is ESRD on HD TTSat- currently in HD. Repeat BCx from 7/17 show ngtd. WBC down to 21.9  Plan: Cont Unasyn 1.5g IV q12h Follow HD schedule/tolerance, clinical progression, LOT  Height: 5' 2.99" (160 cm) Weight: 124 lb 1.9 oz (56.3 kg) IBW/kg (Calculated) : 52.38  Temp (24hrs), Avg:98.3 F (36.8 C), Min:97.7 F (36.5 C), Max:98.8 F (37.1 C)  Recent Labs  Lab 09/25/17 1841 09/25/17 1847 09/26/17 0304 09/28/17 0819 09/29/17 0330  WBC 22.2*  --  11.9* 25.1* 21.9*  CREATININE 3.83*  --  4.80* 5.80*  --   LATICACIDVEN  --  1.67  --   --   --     Estimated Creatinine Clearance: 6.1 mL/min (A) (by C-G formula based on SCr of 5.8 mg/dL (H)).    No Known Allergies  Antimicrobials this admission: Vancomycin 7/13 >> 7/14 Zosyn 7/13 >> 7/14 Meropenem 7/14>>7/16 Unasyn 7/16>>  Dose adjustments this admission: N/A  Microbiology results: 7/13 BCx: 1/2 Acinetobacter baumannii (sens to gentamicin, imipenem, Bctrim, and Unasyn); 1/2 CoNS 7/14 MRSA PCR: negative 7/14 CDiff: neg  Thank you for allowing pharmacy to be a part of this patient's care.  Georgina PillionElizabeth Darius Fillingim, PharmD, BCPS Clinical Pharmacist Pager: [phone] Clinical phone for 09/30/2017 from 7a-3:30p: [phone] If after 3:30p, please call main pharmacy at: [phone] Please check AMION for all [facility] numbers 09/30/2017 9:44 AM

## 2017-09-30 NOTE — Progress Notes (Signed)
  Speech Language Pathology Treatment: Dysphagia  Patient Details Name: Nicole Cox MRN: [id number] DOB: 1932-01-14 Today's Date: 09/30/2017 Time: [id number] SLP Time Calculation (min) (ACUTE ONLY): 11 min  Assessment / Plan / Recommendation Clinical Impression  Pt is alert and sitting upright in her chair this morning on 5L via [place]. With Caro in place she is able to consume PO trials with more stable respirations. Subtle signs of possible dysphagia and/or aspiration include immediate throat clearing, delayed cough after SLP left the room. Also note that she was on a dysphagia diet at SNF, but pt is unable to describe what consistencies she was consuming. Recommend proceeding with MBS this morning to assess oropharyngeal function and assist in determining least restrictive diet.    HPI HPI: Pt is an 84 y.o. female who presents from SNF with AMS, SOB after dialysis. Outpatient CXR showed PNA. Pt denies any h/o dysphagia but is on a dysphagia diet per MD note and although she believes she has had PNA before she is not sure when this was. PMH: CHF, CAD, COPD, diabetes, HTN, ESRD, CVA      SLP Plan  MBS       Recommendations  Diet recommendations: Other(comment)(ice chips, sips of water after oral care) Medication Administration: Crushed with puree                Oral Care Recommendations: Oral care QID;Oral care prior to ice chip/H20 Follow up Recommendations: Skilled Nursing facility SLP Visit Diagnosis: Dysphagia, unspecified (R13.10) Plan: MBS       GO                Maxcine Hamaiewonsky, Terrion Gencarelli 09/30/2017, 9:05 AM  Maxcine HamLaura Paiewonsky, M.A. CCC-SLP [phone]

## 2017-09-30 NOTE — [place] FL2 (Addendum)
[place] MEDICAID FL2 LEVEL OF CARE SCREENING TOOL     IDENTIFICATION  Patient Name: Nicole Cox Birthdate: 18-Jan-1932 Sex: female Admission Date (Current Location): 09/25/2017  [facility] and [place] Number:  Producer, television/film/video and Address:  [facility], [address] [place], [place] [address]      Provider Number: [id number]  Attending Physician Name and Address:  Darlin Drop, DO  Relative Name and Phone Number:       Current Level of Care: Hospital Recommended Level of Care: Skilled Nursing Facility Prior Approval Number:    Date Approved/Denied:   PASRR Number:    Discharge Plan: SNF    Current Diagnoses: Patient Active Problem List   Diagnosis Date Noted  . Delirium 09/25/2017  . Bacteremia   . SIRS (systemic inflammatory response syndrome) (HCC) 07/27/2017  . Malnutrition of moderate degree 06/10/2017  . Pressure injury of skin 06/08/2017  . Acute pulmonary edema (HCC) 06/07/2017  . Respiratory failure with hypoxia (HCC) 12/12/2015  . Acute on chronic respiratory failure with hypoxia (HCC) 12/12/2015  . Fluid overload 12/12/2015  . Palliative care encounter   . Acute respiratory failure with hypoxia (HCC) 08/13/2015  . ESRD on hemodialysis (HCC) 08/13/2015  . Hypertensive urgency 08/13/2015  . Chronic anemia 08/13/2015  . Acute respiratory failure (HCC) 08/13/2015  . HCAP (healthcare-associated pneumonia) 08/11/2012  . Hypoxia 08/11/2012  . Hypertension 08/11/2012  . DM (diabetes mellitus), type 2 with renal complications (HCC) 08/11/2012  . C1 cervical fracture (HCC) 08/11/2012  . Abnormal MRI, thoracic spine 08/11/2012  . COPD (chronic obstructive pulmonary disease) (HCC) 08/11/2012  . CAD (coronary artery disease) 08/11/2012  . Fall 08/11/2012    Orientation RESPIRATION BLADDER Height & Weight     Self, Place, Situation  O2(see DC summary) Continent Weight: 124 lb 1.9 oz (56.3 kg) Height:  5' 2.99" (160 cm)   BEHAVIORAL SYMPTOMS/MOOD NEUROLOGICAL BOWEL NUTRITION STATUS      Continent Diet(see DC summary)  AMBULATORY STATUS COMMUNICATION OF NEEDS Skin   Extensive Assist Verbally Normal                       Personal Care Assistance Level of Assistance  Bathing, Dressing Bathing Assistance: Maximum assistance   Dressing Assistance: Maximum assistance     Functional Limitations Info             SPECIAL CARE FACTORS FREQUENCY  PT (By licensed PT), OT (By licensed OT)     PT Frequency: 5/wk OT Frequency: 5/wk            Contractures      Additional Factors Info  Code Status, Allergies, Insulin Sliding Scale, Isolation Precautions Code Status Info: FULL Allergies Info: NKA   Insulin Sliding Scale Info: 3/day Isolation Precautions Info: MDRO, MRSA     Current Medications (09/30/2017):  This is the current hospital active medication list Current Facility-Administered Medications  Medication Dose Route Frequency Provider Last Rate Last Dose  . albuterol (PROVENTIL) (2.5 MG/3ML) 0.083% nebulizer solution 2.5 mg  2.5 mg Inhalation Q6H PRN Hillary Bow, DO   2.5 mg at 09/26/17 0018  . ampicillin-sulbactam (UNASYN) 1.5 g in sodium chloride 0.9 % 100 mL IVPB  1.5 g Intravenous Q12H Bajbus, Lauren D, RPH 200 mL/hr at 09/30/17 0345 1.5 g at 09/30/17 0345  . aspirin EC tablet 81 mg  81 mg Oral Daily Hillary Bow, DO   81 mg at 09/29/17 2725  .  Chlorhexidine Gluconate Cloth 2 % PADS 6 each  6 each Topical Q0600 Lauris PoagPowell, Alvin C, MD   6 each at 09/29/17 [phone]  . doxercalciferol (HECTOROL) capsule 2.5 mcg  2.5 mcg Oral Q T,Th,Sa-HD Lyda PeroneGardner, Jared M, DO   2.5 mcg at 09/30/17 1304  . fluticasone furoate-vilanterol (BREO ELLIPTA) 100-25 MCG/INH 1 puff  1 puff Inhalation Daily Lyda PeroneGardner, Jared M, DO   1 puff at 09/30/17 0755  . heparin injection 5,000 Units  5,000 Units Subcutaneous Q8H Hillary BowGardner, Jared M, DO   5,000 Units at 09/30/17 [phone]  . insulin aspart (novoLOG) injection 0-9 Units   0-9 Units Subcutaneous TID WC Hillary BowGardner, Jared M, DO   1 Units at 09/30/17 1302  . isosorbide mononitrate (IMDUR) 24 hr tablet 30 mg  30 mg Oral QPM Lyda PeroneGardner, Jared M, DO   30 mg at 09/29/17 1747  . lisinopril (PRINIVIL,ZESTRIL) tablet 20 mg  20 mg Oral QPM Alwyn RenMathews, Elizabeth G, MD   20 mg at 09/29/17 1748  . MEDLINE mouth rinse  15 mL Mouth Rinse BID Fanny DanceAaron, Caren T, MD   15 mL at 09/29/17 2255  . metoprolol tartrate (LOPRESSOR) injection 5 mg  5 mg Intravenous Q5 min PRN Audrea MuscatBlount, Xenia T, NP   5 mg at 09/25/17 2359  . metoprolol tartrate (LOPRESSOR) tablet 12.5 mg  12.5 mg Oral Daily Lyda PeroneGardner, Jared M, DO   12.5 mg at 09/29/17 0839  . NIFEdipine (PROCARDIA XL/ADALAT-CC) 24 hr tablet 60 mg  60 mg Oral BID Hillary BowGardner, Jared M, DO   60 mg at 09/29/17 2254  . pravastatin (PRAVACHOL) tablet 40 mg  40 mg Oral Daily Lyda PeroneGardner, Jared M, DO   40 mg at 09/29/17 0842  . RESOURCE THICKENUP CLEAR   Oral PRN Darlin DropHall, Bladen Umar N, DO      . traMADol Janean Sark(ULTRAM) tablet 50 mg  50 mg Oral Q8H PRN Hillary BowGardner, Jared M, DO   50 mg at 09/29/17 2026  . umeclidinium bromide (INCRUSE ELLIPTA) 62.5 MCG/INH 1 puff  1 puff Inhalation Daily Hillary BowGardner, Jared M, DO   1 puff at 09/30/17 0755     Discharge Medications: Please see discharge summary for a list of discharge medications.  Relevant Imaging Results:  Relevant Lab Results:   Additional Information SSN: [id number]; dialysis TTS   Burna SisUris, Jenna H, LCSW

## 2017-09-30 NOTE — Progress Notes (Signed)
PROGRESS NOTE  Nicole Cox WUJ:[id number] DOB: March 30, 1931 DOA: 09/25/2017 PCP: Joana Reamer, MD  HPI/Recap of past 24 hours: 84 y.o.femalewith medical history significant ofchronic diastolic CHF, coronary artery disease, COPD, type II diabetes, hypertension, end-stage renal disease on dialysis Tuesday Thursday Saturday, CVA, presents to emergency department with altered level of consciousness and shortness of breath. Patient is from nursing home facility. Patient apparently has been sick for a week. She had an outpatient x-ray which showed pneumonia. She has been treated with Rocephin for 7 days now. Today she went to dialysis and when she got back, patient seemed to be more disoriented than at baseline, not responding as she normally would. Patient was transferred here for further evaluation. Admitted for acute metabolic encephalopathy in the setting of HCAP.  Hospital course complicated by Acetobacter bacteremia and acute hypoxic respiratory failure requiring Ventimask to maintain O2 saturation above 90%.  09/30/2017: Patient seen and examined at her bedside she is alert and oriented x3.  States her respiration is improved.  She is now on 5 L oxygen by nasal cannula.  Plan for hemodialysis today.    Assessment/Plan: Principal Problem:   SIRS (systemic inflammatory response syndrome) (HCC) Active Problems:   Hypertension   DM (diabetes mellitus), type 2 with renal complications (HCC)   COPD (chronic obstructive pulmonary disease) (HCC)   Acute respiratory failure with hypoxia (HCC)   ESRD on hemodialysis (HCC)   Delirium  Acute metabolic encephalopathy most likely secondary to sepsis from multifocal HCAP versus Acinetobacter bacteremia, resolved   Acute hypoxic respiratory failure secondary to multifocal HCAP, improving Continue O2 saturation via nasal cannula to maintain O2 saturation between 88 and 92% Reassess O2 demand after hemodialysis today 09/30/2017  Resolving  sepsis secondary to Acineterobacter bacteremia/HCAP Positive cultures x2 from 09/25/2017-Also MRSA x 1 bottle but suspected to be a contaminant Repeat blood cultures x2 on 09/29/2017 with no growth in 1 day Continue IV Unasyn  HCAP, poa Tx plan as stated above  Severe protein calorie malnutrition Albumin 2.1 Start oral supplement  Dysphagia, improving Speech therapy evaluated and recommended a diet dysphagia 2 with nectar thick liquid  Chronic diastolic CHF Last 2D echo done on 08/02/2017 revealed LVEF 55 to 60% BNP greater than 2000 Nephrology following for dialysis to remove excess fluid  End-stage renal disease on dialysisTuesday Thursday Saturday Next dialysis planned Thursday, 09/30/2017 Nephrology following  Hypertension Stable Continue home medications   Code Status: Full code  Family Communication: None at bedside  Disposition Plan: Home in 1-2 days when nephrology signs off   Consultants:  Nephrology  Procedures:  Hemodialysis T, Th, S  Antimicrobials: Unasyn  DVT prophylaxis: Subcu heparin 3 times daily   Objective: Vitals:   09/30/17 0500 09/30/17 0727 09/30/17 0756 09/30/17 1150  BP: (!) 146/36 (!) 160/39  (!) 169/47  Pulse: 91 95  93  Resp: 15 13  12   Temp:  98.8 F (37.1 C)  99.2 F (37.3 C)  TempSrc:  Axillary  Oral  SpO2: 98% 96% 100% 100%  Weight:      Height:        Intake/Output Summary (Last 24 hours) at 09/30/2017 1418 Last data filed at 09/30/2017 0415 Gross per 24 hour  Intake 200 ml  Output 0 ml  Net 200 ml   Filed Weights   09/27/17 0045 09/28/17 0805 09/28/17 1211  Weight: 58.5 kg (128 lb 15.5 oz) 59.7 kg (131 lb 9.8 oz) 56.3 kg (124 lb 1.9 oz)    Exam:  .  General: 84 y.o. year-old female frail in NAD A&O x 3 . Cardiovascular: RRR no rubs or gallops. No JVD or thyromegaly. Marland Kitchen Respiratory: Diffuse rales noted bilaterally.  Poor inspiratory effort. . Abdomen: Soft nontender nondistended with normal bowel sounds x4  quadrants. . Psychiatry: Mood is appropriate for condition and setting   Data Reviewed: CBC: Recent Labs  Lab 09/25/17 1841 09/26/17 0304 09/28/17 0819 09/29/17 0330  WBC 22.2* 11.9* 25.1* 21.9*  NEUTROABS 19.8*  --   --  16.6*  HGB 9.5* 9.6* 8.2* 8.7*  HCT 31.6* 32.6* 27.6* 28.9*  MCV 89.5 89.3 88.2 87.0  PLT 295 268 314 334   Basic Metabolic Panel: Recent Labs  Lab 09/25/17 1841 09/26/17 0304 09/28/17 0819  NA 138 141 137  K 5.0 4.0 3.8  CL 102 103 100  CO2 25 28 26   GLUCOSE 86 65* 125*  BUN 14 18 37*  CREATININE 3.83* 4.80* 5.80*  CALCIUM 7.6* 8.3* 8.7*  PHOS  --   --  2.4*   GFR: Estimated Creatinine Clearance: 6.1 mL/min (A) (by C-G formula based on SCr of 5.8 mg/dL (H)). Liver Function Tests: Recent Labs  Lab 09/25/17 1841 09/28/17 0819  AST 111*  --   ALT 45*  --   ALKPHOS 106  --   BILITOT 1.2  --   PROT 6.5  --   ALBUMIN 2.3* 2.1*   No results for input(s): LIPASE, AMYLASE in the last 168 hours. No results for input(s): AMMONIA in the last 168 hours. Coagulation Profile: No results for input(s): INR, PROTIME in the last 168 hours. Cardiac Enzymes: No results for input(s): CKTOTAL, CKMB, CKMBINDEX, TROPONINI in the last 168 hours. BNP (last 3 results) No results for input(s): PROBNP in the last 8760 hours. HbA1C: No results for input(s): HGBA1C in the last 72 hours. CBG: Recent Labs  Lab 09/29/17 1153 09/29/17 1646 09/29/17 2205 09/30/17 0726 09/30/17 1148  GLUCAP 100* 133* 101* 87 135*   Lipid Profile: No results for input(s): CHOL, HDL, LDLCALC, TRIG, CHOLHDL, LDLDIRECT in the last 72 hours. Thyroid Function Tests: No results for input(s): TSH, T4TOTAL, FREET4, T3FREE, THYROIDAB in the last 72 hours. Anemia Panel: No results for input(s): VITAMINB12, FOLATE, FERRITIN, TIBC, IRON, RETICCTPCT in the last 72 hours. Urine analysis:    Component Value Date/Time   COLORURINE YELLOW 08/11/2012 1917   APPEARANCEUR CLOUDY (A) 08/11/2012  1917   LABSPEC 1.013 08/11/2012 1917   PHURINE 7.5 08/11/2012 1917   GLUCOSEU 100 (A) 08/11/2012 1917   HGBUR TRACE (A) 08/11/2012 1917   BILIRUBINUR NEGATIVE 08/11/2012 1917   KETONESUR NEGATIVE 08/11/2012 1917   PROTEINUR >300 (A) 08/11/2012 1917   UROBILINOGEN 0.2 08/11/2012 1917   NITRITE NEGATIVE 08/11/2012 1917   LEUKOCYTESUR NEGATIVE 08/11/2012 1917   Sepsis Labs: @LABRCNTIP (procalcitonin:4,lacticidven:4)  ) Recent Results (from the past 240 hour(s))  Blood culture (routine x 2)     Status: Abnormal   Collection Time: 09/25/17  6:41 PM  Result Value Ref Range Status   Specimen Description BLOOD RIGHT FOREARM  Final   Special Requests   Final    BOTTLES DRAWN AEROBIC AND ANAEROBIC Blood Culture adequate volume   Culture  Setup Time   Final    GRAM POSITIVE COCCI AEROBIC BOTTLE ONLY CRITICAL RESULT CALLED TO, READ BACK BY AND VERIFIED WITH: B. MANCHERIL, RPHARMD AT 1604 ON 09/26/17 BY C. JESSUP, MLT.    Culture (A)  Final    STAPHYLOCOCCUS SPECIES (COAGULASE NEGATIVE) THE SIGNIFICANCE OF ISOLATING THIS  ORGANISM FROM A SINGLE SET OF BLOOD CULTURES WHEN MULTIPLE SETS ARE DRAWN IS UNCERTAIN. PLEASE NOTIFY THE MICROBIOLOGY DEPARTMENT WITHIN ONE WEEK IF SPECIATION AND SENSITIVITIES ARE REQUIRED. Performed at [facility], [address], [place], [place] [address]    Report Status 09/28/2017 FINAL  Final  Blood Culture ID Panel (Reflexed)     Status: Abnormal   Collection Time: 09/25/17  6:41 PM  Result Value Ref Range Status   Enterococcus species NOT DETECTED NOT DETECTED Final   Listeria monocytogenes NOT DETECTED NOT DETECTED Final   Staphylococcus species DETECTED (A) NOT DETECTED Final    Comment: Methicillin (oxacillin) resistant coagulase negative staphylococcus. Possible blood culture contaminant (unless isolated from more than one blood culture draw or clinical case suggests pathogenicity). No antibiotic treatment is indicated for blood  culture  contaminants. CRITICAL RESULT CALLED TO, READ BACK BY AND VERIFIED WITH: B. MANCHERIL, RPHARMD AT 1604 ON 09/26/17 BY C. JESSUP, MLT.    Staphylococcus aureus NOT DETECTED NOT DETECTED Final   Methicillin resistance DETECTED (A) NOT DETECTED Final    Comment: CRITICAL RESULT CALLED TO, READ BACK BY AND VERIFIED WITH: B. MANCHERIL, RPHARMD AT 1604 ON 09/26/17 BY C. JESSUP, MLT.    Streptococcus species NOT DETECTED NOT DETECTED Final   Streptococcus agalactiae NOT DETECTED NOT DETECTED Final   Streptococcus pneumoniae NOT DETECTED NOT DETECTED Final   Streptococcus pyogenes NOT DETECTED NOT DETECTED Final   Acinetobacter baumannii NOT DETECTED NOT DETECTED Final   Enterobacteriaceae species NOT DETECTED NOT DETECTED Final   Enterobacter cloacae complex NOT DETECTED NOT DETECTED Final   Escherichia coli NOT DETECTED NOT DETECTED Final   Klebsiella oxytoca NOT DETECTED NOT DETECTED Final   Klebsiella pneumoniae NOT DETECTED NOT DETECTED Final   Proteus species NOT DETECTED NOT DETECTED Final   Serratia marcescens NOT DETECTED NOT DETECTED Final   Haemophilus influenzae NOT DETECTED NOT DETECTED Final   Neisseria meningitidis NOT DETECTED NOT DETECTED Final   Pseudomonas aeruginosa NOT DETECTED NOT DETECTED Final   Candida albicans NOT DETECTED NOT DETECTED Final   Candida glabrata NOT DETECTED NOT DETECTED Final   Candida krusei NOT DETECTED NOT DETECTED Final   Candida parapsilosis NOT DETECTED NOT DETECTED Final   Candida tropicalis NOT DETECTED NOT DETECTED Final    Comment: Performed at [facility], [address] [place], [place] [address]  Blood culture (routine x 2)     Status: Abnormal   Collection Time: 09/25/17  6:57 PM  Result Value Ref Range Status   Specimen Description BLOOD RIGHT WRIST  Final   Special Requests   Final    BOTTLES DRAWN AEROBIC AND ANAEROBIC Blood Culture adequate volume   Culture  Setup Time   Final    GRAM NEGATIVE RODS AEROBIC BOTTLE  ONLY CRITICAL RESULT CALLED TO, READ BACK BY AND VERIFIED WITH: B. MANCHERIL, RPHARMD AT 1250 ON 09/26/17 BY C. JESSUP, MLT. Performed at [facility], [address] [place], [place] [address]    Culture ACINETOBACTER BAUMANNII (A)  Final   Report Status 09/28/2017 FINAL  Final   Organism ID, Bacteria ACINETOBACTER BAUMANNII  Final      Susceptibility   Acinetobacter baumannii - MIC*    CEFTAZIDIME >=64 RESISTANT Resistant     CEFTRIAXONE >=64 RESISTANT Resistant     CIPROFLOXACIN >=4 RESISTANT Resistant     GENTAMICIN 4 SENSITIVE Sensitive     IMIPENEM 1 SENSITIVE Sensitive     PIP/TAZO >=128 RESISTANT Resistant  TRIMETH/SULFA <=20 SENSITIVE Sensitive     CEFEPIME >=64 RESISTANT Resistant     AMPICILLIN/SULBACTAM 4 SENSITIVE Sensitive     * ACINETOBACTER BAUMANNII  Blood Culture ID Panel (Reflexed)     Status: Abnormal   Collection Time: 09/25/17  6:57 PM  Result Value Ref Range Status   Enterococcus species NOT DETECTED NOT DETECTED Final   Listeria monocytogenes NOT DETECTED NOT DETECTED Final   Staphylococcus species NOT DETECTED NOT DETECTED Final   Staphylococcus aureus NOT DETECTED NOT DETECTED Final   Streptococcus species NOT DETECTED NOT DETECTED Final   Streptococcus agalactiae NOT DETECTED NOT DETECTED Final   Streptococcus pneumoniae NOT DETECTED NOT DETECTED Final   Streptococcus pyogenes NOT DETECTED NOT DETECTED Final   Acinetobacter baumannii DETECTED (A) NOT DETECTED Final    Comment: CRITICAL RESULT CALLED TO, READ BACK BY AND VERIFIED WITH: B. MANCHERIL, RPHARMD AT 1250 ON 09/26/17 BY C. JESSUP, MLT.    Enterobacteriaceae species NOT DETECTED NOT DETECTED Final   Enterobacter cloacae complex NOT DETECTED NOT DETECTED Final   Escherichia coli NOT DETECTED NOT DETECTED Final   Klebsiella oxytoca NOT DETECTED NOT DETECTED Final   Klebsiella pneumoniae NOT DETECTED NOT DETECTED Final   Proteus species NOT DETECTED NOT DETECTED Final   Serratia  marcescens NOT DETECTED NOT DETECTED Final   Carbapenem resistance NOT DETECTED NOT DETECTED Final   Haemophilus influenzae NOT DETECTED NOT DETECTED Final   Neisseria meningitidis NOT DETECTED NOT DETECTED Final   Pseudomonas aeruginosa NOT DETECTED NOT DETECTED Final   Candida albicans NOT DETECTED NOT DETECTED Final   Candida glabrata NOT DETECTED NOT DETECTED Final   Candida krusei NOT DETECTED NOT DETECTED Final   Candida parapsilosis NOT DETECTED NOT DETECTED Final   Candida tropicalis NOT DETECTED NOT DETECTED Final    Comment: Performed at [facility], [address], [place], [place] [address]  MRSA PCR Screening     Status: None   Collection Time: 09/26/17 12:53 AM  Result Value Ref Range Status   MRSA by PCR NEGATIVE NEGATIVE Final    Comment:        The GeneXpert MRSA Assay (FDA approved for NASAL specimens only), is one component of a comprehensive MRSA colonization surveillance program. It is not intended to diagnose MRSA infection nor to guide or monitor treatment for MRSA infections. Performed at [facility], [address], [place], [place] [address]   C difficile quick scan w PCR reflex     Status: None   Collection Time: 09/26/17  2:31 PM  Result Value Ref Range Status   C Diff antigen NEGATIVE NEGATIVE Final   C Diff toxin NEGATIVE NEGATIVE Final   C Diff interpretation No C. difficile detected.  Final    Comment: Performed at [facility], [address], [place], [place] [address]  Culture, blood (routine x 2)     Status: None (Preliminary result)   Collection Time: 09/29/17  8:14 AM  Result Value Ref Range Status   Specimen Description BLOOD RIGHT HAND  Final   Special Requests   Final    BOTTLES DRAWN AEROBIC AND ANAEROBIC Blood Culture results may not be optimal due to an excessive volume of blood received in culture bottles   Culture  Setup Time PENDING  Incomplete   Culture   Final    NO GROWTH 1 DAY Performed at [facility], [address] [place], [place] [address]    Report Status PENDING  Incomplete      Studies: Dg Swallowing Func-speech Pathology  Result Date: 09/30/2017 Objective Swallowing Evaluation: Type of Study: MBS-Modified Barium Swallow Study  Patient Details Name: [name] MRN: [id number] Date of Birth: [birth year] Today's Date: 09/30/2017 Time: SLP Start Time (ACUTE ONLY): [id number] -SLP Stop Time (ACUTE ONLY): 0958 SLP Time Calculation (min) (ACUTE ONLY): 19 min Past Medical History: Past Medical History: Diagnosis Date . CHF (congestive heart failure) (HCC)  . COPD (chronic obstructive pulmonary disease) (HCC)  . Coronary artery disease  . Diabetes mellitus without complication (HCC)  . Heart disease  . Hypertension  . Renal disorder  . Stroke [facility](HCC)  Past Surgical History: Past Surgical History: Procedure Laterality Date . ABDOMINAL HYSTERECTOMY   . CHOLECYSTECTOMY   . TEE WITHOUT CARDIOVERSION N/A 08/02/2017  Procedure: TRANSESOPHAGEAL ECHOCARDIOGRAM (TEE);  Surgeon: Lars MassonNelson, Katarina H, MD;  [facility];  Service: Cardiovascular;  Laterality: N/A; . TONSILLECTOMY   HPI: Pt is an 84 y.o. female who presents from SNF with AMS, SOB after dialysis. Outpatient CXR showed PNA. Pt denies any h/o dysphagia but is on a dysphagia diet per MD note and although she believes she has had PNA before she is not sure when this was. PMH: CHF, CAD, COPD, diabetes, HTN, ESRD, CVA  Subjective: pt alert, hungry/thirsty Assessment / Plan / Recommendation CHL IP CLINICAL IMPRESSIONS 09/30/2017 Clinical Impression Pt has a mild oropharyngeal dysphagia also impacted by cognitive status with impulsive rate of intake and inconsistent command following. Orally she has slow mastication due to missing dentition, delayed oral transit with lingual residue noted with purees, and premature spillage of liquids. Oral containment is not facilitate by a chin tuck, and she does not slow her rate or take single sips despite cueing  from SLP. Thin liquids and nectar thick liquids by cup reach her pyriform sinuses before the swallow and are penetrated to the vocal folds without attempts to clear. Mod cues from SLP for volitional cough are not fully effective at clearing the laryngeal vestibule. While this is not necessarily abnormal for her age, her compromised respiratory system and decreased functional reserve from acute illness would make her more susceptible to negative side effects from aspiration at this time. Straw sips of nectar thick liquids spill only to the vallecula, allowing for better airway protection during the swallow. Recommend starting Dys 2 diet and nectar thick liquids via straw. Will f/u for tolerance and potential to advance as she recovers from her acute hospitalization. SLP Visit Diagnosis Dysphagia, oropharyngeal phase (R13.12) Attention and concentration deficit following -- Frontal lobe and executive function deficit following -- Impact on safety and function Moderate aspiration risk   CHL IP TREATMENT RECOMMENDATION 09/30/2017 Treatment Recommendations Therapy as outlined in treatment plan below   Prognosis 09/30/2017 Prognosis for Safe Diet Advancement Good Barriers to Reach Goals Cognitive deficits Barriers/Prognosis Comment -- CHL IP DIET RECOMMENDATION 09/30/2017 SLP Diet Recommendations Dysphagia 2 (Fine chop) solids;Nectar thick liquid Liquid Administration via Straw Medication Administration Whole meds with puree Compensations Minimize environmental distractions;Slow rate;Small sips/bites Postural Changes Seated upright at 90 degrees;Remain semi-upright after after feeds/meals (Comment)   CHL IP OTHER RECOMMENDATIONS 09/30/2017 Recommended Consults -- Oral Care Recommendations Oral care BID Other Recommendations --   CHL IP FOLLOW UP RECOMMENDATIONS 09/30/2017 Follow up [facility]ations [facility] D[facility] 09/30/2017 Speech Therapy Frequency (ACUTE ONLY) min 2x/week Treatment  Duration 2 weeks      CHL IP ORAL PHASE 09/30/2017 Oral Phase Impaired Oral - Pudding  Teaspoon -- Oral - Pudding Cup -- Oral - Honey Teaspoon -- Oral - Honey Cup -- Oral - Nectar Teaspoon -- Oral - Nectar Cup Premature spillage;Decreased bolus cohesion Oral - Nectar Straw Decreased bolus cohesion;Premature spillage Oral - Thin Teaspoon -- Oral - Thin Cup Decreased bolus cohesion;Premature spillage Oral - Thin Straw Decreased bolus cohesion;Premature spillage Oral - Puree Lingual/palatal residue Oral - Mech Soft Impaired mastication;Delayed oral transit Oral - Regular -- Oral - Multi-Consistency -- Oral - Pill -- Oral Phase - Comment --  CHL IP PHARYNGEAL PHASE 09/30/2017 Pharyngeal Phase Impaired Pharyngeal- Pudding Teaspoon -- Pharyngeal -- Pharyngeal- Pudding Cup -- Pharyngeal -- Pharyngeal- Honey Teaspoon -- Pharyngeal -- Pharyngeal- Honey Cup -- Pharyngeal -- Pharyngeal- Nectar Teaspoon -- Pharyngeal -- Pharyngeal- Nectar Cup Delayed swallow initiation-pyriform sinuses;Penetration/Aspiration before swallow Pharyngeal Material enters airway, CONTACTS cords and not ejected out Pharyngeal- Nectar Straw Delayed swallow initiation-vallecula Pharyngeal -- Pharyngeal- Thin Teaspoon -- Pharyngeal -- Pharyngeal- Thin Cup Delayed swallow initiation-pyriform sinuses;Penetration/Aspiration before swallow;Compensatory strategies attempted (with notebox) Pharyngeal Material enters airway, CONTACTS cords and not ejected out Pharyngeal- Thin Straw Delayed swallow initiation-pyriform sinuses;Penetration/Aspiration before swallow Pharyngeal Material enters airway, passes BELOW cords without attempt by patient to eject out (silent aspiration) Pharyngeal- Puree WFL Pharyngeal -- Pharyngeal- Mechanical Soft WFL Pharyngeal -- Pharyngeal- Regular -- Pharyngeal -- Pharyngeal- Multi-consistency -- Pharyngeal -- Pharyngeal- Pill -- Pharyngeal -- Pharyngeal Comment --  CHL IP CERVICAL ESOPHAGEAL PHASE 09/30/2017 Cervical Esophageal Phase WFL  Pudding Teaspoon -- Pudding Cup -- Honey Teaspoon -- Honey Cup -- Nectar Teaspoon -- Nectar Cup -- Nectar Straw -- Thin Teaspoon -- Thin Cup -- Thin Straw -- Puree -- Mechanical Soft -- Regular -- Multi-consistency -- Pill -- Cervical Esophageal Comment -- No flowsheet data found. Maxcine Ham 09/30/2017, 11:01 AM  Maxcine Ham, M.A. CCC-SLP [phone]              Scheduled Meds: . aspirin EC  81 mg Oral Daily  . Chlorhexidine Gluconate Cloth  6 each Topical Q0600  . doxercalciferol  2.5 mcg Oral Q T,Th,Sa-HD  . fluticasone furoate-vilanterol  1 puff Inhalation Daily  . heparin  5,000 Units Subcutaneous Q8H  . insulin aspart  0-9 Units Subcutaneous TID WC  . isosorbide mononitrate  30 mg Oral QPM  . lisinopril  20 mg Oral QPM  . mouth rinse  15 mL Mouth Rinse BID  . metoprolol tartrate  12.5 mg Oral Daily  . NIFEdipine  60 mg Oral BID  . pravastatin  40 mg Oral Daily  . umeclidinium bromide  1 puff Inhalation Daily    Continuous Infusions: . ampicillin-sulbactam (UNASYN) IV 1.5 g (09/30/17 0345)     LOS: 5 days     Darlin Drop, MD [facility] Pager [phone]  If 7PM-7AM, please contact night-coverage [email or web address] Password [email or web address] 09/30/2017, 2:18 PM

## 2017-09-30 NOTE — Progress Notes (Signed)
Dialysis:[facility] Dy[facility] AV graft, heparin 2000   Impression: 1Sepsis/wbc/ pulm infiltrates/ +acinetobacter bacteremia -  2 ESRD TTS HD. Had-3L 7/16 on HD.For HD again today 3 HTN - on mult bp meds at home., BP stable here 4 DM on insulin 5 COPD / Hist of recurrent PNA 6 H/o CVA  Subjective: Interval History: zweak working with PT  Objective: Vital signs in last 24 hours: Temp:  [97.7 F (36.5 C)-99.2 F (37.3 C)] 99.2 F (37.3 C) (07/18 1150) Pulse Rate:  [79-95] 93 (07/18 1150) Resp:  [8-18] 12 (07/18 1150) BP: (141-169)/(28-74) 169/47 (07/18 1150) SpO2:  [95 %-100 %] 100 % (07/18 1150) Weight change:   Intake/Output from previous day: 07/17 0701 - 07/18 0700 In: 200 [IV Piggyback:200] Out: 0  Intake/Output this shift: No intake/output data recorded.  General appearance: alert and frail and thin Extremities: no edema of LEs, muscle atrophy diffusely  Lab Results: Recent Labs    09/28/17 0819 09/29/17 0330  WBC 25.1* 21.9*  HGB 8.2* 8.7*  HCT 27.6* 28.9*  PLT 314 334   BMET:  Recent Labs    09/28/17 0819  NA 137  K 3.8  CL 100  CO2 26  GLUCOSE 125*  BUN 37*  CREATININE 5.80*  CALCIUM 8.7*   No results for input(s): PTH in the last 72 hours. Iron Studies: No results for input(s): IRON, TIBC, TRANSFERRIN, FERRITIN in the last 72 hours. Studies/Results: Dg Chest 1 View  Result Date: 09/28/2017 CLINICAL DATA:  Hypoxia. EXAM: CHEST  1 VIEW COMPARISON:  09/26/2017. FINDINGS: Cardiomegaly with diffuse bilateral pulmonary interstitial prominence consistent with pulmonary interstitial edema and/or pneumonitis. A component these changes may be chronic. Exam unchanged from prior exam. Right base calcified pleural plaque is unchanged. No pneumothorax. IMPRESSION: Cardiomegaly. Diffuse bilateral from interstitial prominence is unchanged from prior exam as described above. Electronically Signed   By: Maisie Fus  Register   On: 09/28/2017 15:55   Dg  Swallowing Func-speech Pathology  Result Date: 09/30/2017 Objective Swallowing Evaluation: Type of Study: MBS-Modified Barium Swallow Study  Patient Details Name: Nicole Cox MRN: [id number] Date of Birth: Nov 19, 1931 Today's Date: 09/30/2017 Time: SLP Start Time (ACUTE ONLY): 4098 -SLP Stop Time (ACUTE ONLY): 0958 SLP Time Calculation (min) (ACUTE ONLY): 19 min Past Medical History: Past Medical History: Diagnosis Date . CHF (congestive heart failure) (HCC)  . COPD (chronic obstructive pulmonary disease) (HCC)  . Coronary artery disease  . Diabetes mellitus without complication (HCC)  . Heart disease  . Hypertension  . Renal disorder  . Stroke [facility])  Past Surgical History: Past Surgical History: Procedure Laterality Date . ABDOMINAL HYSTERECTOMY   . CHOLECYSTECTOMY   . TEE WITHOUT CARDIOVERSION N/A 08/02/2017  Procedure: TRANSESOPHAGEAL ECHOCARDIOGRAM (TEE);  Surgeon: Lars Masson, MD;  Location: [facility] ENDOSCOPY;  Service: Cardiovascular;  Laterality: N/A; . TONSILLECTOMY   HPI: Pt is an 84 y.o. female who presents from SNF with AMS, SOB after dialysis. Outpatient CXR showed PNA. Pt denies any h/o dysphagia but is on a dysphagia diet per MD note and although she believes she has had PNA before she is not sure when this was. PMH: CHF, CAD, COPD, diabetes, HTN, ESRD, CVA  Subjective: pt alert, hungry/thirsty Assessment / Plan / Recommendation CHL IP CLINICAL IMPRESSIONS 09/30/2017 Clinical Impression Pt has a mild oropharyngeal dysphagia also impacted by cognitive status with impulsive rate of intake and inconsistent command following. Orally she has slow mastication due to missing dentition, delayed oral transit with lingual residue noted with purees, and  premature spillage of liquids. Oral containment is not facilitate by a chin tuck, and she does not slow her rate or take single sips despite cueing from SLP. Thin liquids and nectar thick liquids by cup reach her pyriform sinuses before the swallow and are  penetrated to the vocal folds without attempts to clear. Mod cues from SLP for volitional cough are not fully effective at clearing the laryngeal vestibule. While this is not necessarily abnormal for her age, her compromised respiratory system and decreased functional reserve from acute illness would make her more susceptible to negative side effects from aspiration at this time. Straw sips of nectar thick liquids spill only to the vallecula, allowing for better airway protection during the swallow. Recommend starting Dys 2 diet and nectar thick liquids via straw. Will f/u for tolerance and potential to advance as she recovers from her acute hospitalization. SLP Visit Diagnosis Dysphagia, oropharyngeal phase (R13.12) Attention and concentration deficit following -- Frontal lobe and executive function deficit following -- Impact on safety and function Moderate aspiration risk   CHL IP TREATMENT RECOMMENDATION 09/30/2017 Treatment Recommendations Therapy as outlined in treatment plan below   Prognosis 09/30/2017 Prognosis for Safe Diet Advancement Good Barriers to Reach Goals Cognitive deficits Barriers/Prognosis Comment -- CHL IP DIET RECOMMENDATION 09/30/2017 SLP Diet Recommendations Dysphagia 2 (Fine chop) solids;Nectar thick liquid Liquid Administration via Straw Medication Administration Whole meds with puree Compensations Minimize environmental distractions;Slow rate;Small sips/bites Postural Changes Seated upright at 90 degrees;Remain semi-upright after after feeds/meals (Comment)   CHL IP OTHER RECOMMENDATIONS 09/30/2017 Recommended Consults -- Oral Care Recommendations Oral care BID Other Recommendations --   CHL IP FOLLOW UP RECOMMENDATIONS 09/30/2017 Follow up Recommendations Skilled Nursing facility   [facility] IP FREQUENCY AND DURATION 09/30/2017 Speech Therapy Frequency (ACUTE ONLY) min 2x/week Treatment Duration 2 weeks      CHL IP ORAL PHASE 09/30/2017 Oral Phase Impaired Oral - Pudding Teaspoon -- Oral - Pudding  Cup -- Oral - Honey Teaspoon -- Oral - Honey Cup -- Oral - Nectar Teaspoon -- Oral - Nectar Cup Premature spillage;Decreased bolus cohesion Oral - Nectar Straw Decreased bolus cohesion;Premature spillage Oral - Thin Teaspoon -- Oral - Thin Cup Decreased bolus cohesion;Premature spillage Oral - Thin Straw Decreased bolus cohesion;Premature spillage Oral - Puree Lingual/palatal residue Oral - Mech Soft Impaired mastication;Delayed oral transit Oral - Regular -- Oral - Multi-Consistency -- Oral - Pill -- Oral Phase - Comment --  CHL IP PHARYNGEAL PHASE 09/30/2017 Pharyngeal Phase Impaired Pharyngeal- Pudding Teaspoon -- Pharyngeal -- Pharyngeal- Pudding Cup -- Pharyngeal -- Pharyngeal- Honey Teaspoon -- Pharyngeal -- Pharyngeal- Honey Cup -- Pharyngeal -- Pharyngeal- Nectar Teaspoon -- Pharyngeal -- Pharyngeal- Nectar Cup Delayed swallow initiation-pyriform sinuses;Penetration/Aspiration before swallow Pharyngeal Material enters airway, CONTACTS cords and not ejected out Pharyngeal- Nectar Straw Delayed swallow initiation-vallecula Pharyngeal -- Pharyngeal- Thin Teaspoon -- Pharyngeal -- Pharyngeal- Thin Cup Delayed swallow initiation-pyriform sinuses;Penetration/Aspiration before swallow;Compensatory strategies attempted (with notebox) Pharyngeal Material enters airway, CONTACTS cords and not ejected out Pharyngeal- Thin Straw Delayed swallow initiation-pyriform sinuses;Penetration/Aspiration before swallow Pharyngeal Material enters airway, passes BELOW cords without attempt by patient to eject out (silent aspiration) Pharyngeal- Puree WFL Pharyngeal -- Pharyngeal- Mechanical Soft WFL Pharyngeal -- Pharyngeal- Regular -- Pharyngeal -- Pharyngeal- Multi-consistency -- Pharyngeal -- Pharyngeal- Pill -- Pharyngeal -- Pharyngeal Comment --  CHL IP CERVICAL ESOPHAGEAL PHASE 09/30/2017 Cervical Esophageal Phase WFL Pudding Teaspoon -- Pudding Cup -- Honey Teaspoon -- Honey Cup -- Nectar Teaspoon -- Nectar Cup -- Nectar Straw  -- Thin Teaspoon --  Thin Cup -- Thin Straw -- Puree -- Mechanical Soft -- Regular -- Multi-consistency -- Pill -- Cervical Esophageal Comment -- No flowsheet data found. Maxcine Hamaiewonsky, Laura 09/30/2017, 11:01 AM  Maxcine HamLaura Paiewonsky, M.A. CCC-SLP [phone]              Scheduled: . aspirin EC  81 mg Oral Daily  . Chlorhexidine Gluconate Cloth  6 each Topical Q0600  . doxercalciferol  2.5 mcg Oral Q T,Th,Sa-HD  . fluticasone furoate-vilanterol  1 puff Inhalation Daily  . heparin  5,000 Units Subcutaneous Q8H  . insulin aspart  0-9 Units Subcutaneous TID WC  . isosorbide mononitrate  30 mg Oral QPM  . lisinopril  20 mg Oral QPM  . mouth rinse  15 mL Mouth Rinse BID  . metoprolol tartrate  12.5 mg Oral Daily  . NIFEdipine  60 mg Oral BID  . pravastatin  40 mg Oral Daily  . umeclidinium bromide  1 puff Inhalation Daily    LOS: 5 days   Lauris PoagAlvin C Lalani Winkles 09/30/2017,12:55 PM

## 2017-09-30 NOTE — Progress Notes (Signed)
Pt had two runs of SVT, both under ten beats in length. On call NP Schorr notified. No new orders at this time.

## 2017-09-30 NOTE — Progress Notes (Signed)
Pt discussed with MD- possibly ready for DC tomorrow if stable  [facility] [employer] [facility] for [facility]- auth pending  Burna SisJenna H. Ramin Zoll, LCSW Clinical Social Worker [phone]

## 2017-09-30 NOTE — Evaluation (Signed)
Physical Therapy Evaluation Patient Details Name: Nicole Cox MRN: [id number] DOB: October 31, 1931 Today's Date: 09/30/2017   History of Present Illness  Pt is an 84 y.o. female admitted from SNF on 09/25/17 with AMS and SOB. CXR shows bilateral pulmonary infiltrates with suspicion for multifocal pneumonia; worked up for sepsis from HCAP. PMH includes CHF, CAD, COPD, DM, HTN, ESRD on HD, CVA.    Clinical Impression  Pt presents with an overall decrease in functional mobility secondary to above. PTA, pt at SNF where she requires assist for transfers with RW and mobility with w/c; pt poor historian with difficulty [facility] Today, pt required maxA to stand and take steps to recliner with HHA. Demonstrates poor trunk control, easily fatigued sitting EOB with min-modA for balance. SpO2 99% on 5L O2 [place]. Pt would benefit from continued acute PT services to maximize functional mobility and independence prior to d/c with continued SNF-level therapies.     Follow Up Recommendations SNF;Supervision for mobility/OOB    Equipment Recommendations  None recommended by PT    Recommendations for Other Services       Precautions / Restrictions Precautions Precautions: Fall Restrictions Weight Bearing Restrictions: No      Mobility  Bed Mobility Overal bed mobility: Needs Assistance Bed Mobility: Supine to Sit     Supine to sit: Mod assist;HOB elevated        Transfers Overall transfer level: Needs assistance Equipment used: 1 person hand held assist Transfers: Sit to/from Stand Sit to Stand: Max assist         General transfer comment: Pt required maxA to assist trunk elevation. Multimodal cues for bilat hip extension to get feet under her, as pt leaning posteriorly with heavy reliance on maxA to maintain trunk elevation. Bilat knee instability with poor hip extension  Ambulation/Gait Ambulation/Gait assistance: Max assist Gait Distance (Feet): 1  Feet Assistive device: 1 person hand held assist Gait Pattern/deviations: Step-to pattern;Shuffle Gait velocity: Decreased   General Gait Details: Shuffling steps from bed to recliner with maxA to maintain upright posture; pt not clearing either foot completely despite max cues.   Stairs            Wheelchair Mobility    Modified Rankin (Stroke Patients Only)       Balance Overall balance assessment: Needs assistance   Sitting balance-Leahy Scale: Poor Sitting balance - Comments: Able to sit EOB with min guard for brief moments, before fatigue and laying backwards; min-modA to maintain seated balance once fatigued. Difficulty self-correcting     Standing balance-Leahy Scale: Zero                               Pertinent Vitals/Pain Pain Assessment: No/denies pain    Home Living Family/patient expects to be discharged to:: Skilled nursing facility                      Prior Function Level of Independence: Needs assistance   Gait / Transfers Assistance Needed: Pt poor historian - reports primarily laying in bed at SNF. Requires assist for standing with RW and using w/c  ADL's / Homemaking Assistance Needed: Assist for ADLs        Hand Dominance        Extremity/Trunk Assessment   Upper Extremity Assessment Upper Extremity Assessment: Generalized weakness    Lower Extremity Assessment Lower Extremity Assessment: Generalized weakness  Cervical / Trunk Assessment Cervical / Trunk Assessment: Kyphotic  Communication   Communication: Expressive difficulties  Cognition Arousal/Alertness: Awake/alert Behavior During Therapy: Flat affect Overall Cognitive Status: No family/caregiver present to determine baseline cognitive functioning Area of Impairment: Orientation;Attention;Memory;Following commands;Safety/judgement;Awareness;Problem solving                 Orientation Level: Disoriented to;Time;Place;Situation Current  Attention Level: Sustained Memory: Decreased short-term memory Following Commands: Follows one step commands with increased time Safety/Judgement: Decreased awareness of deficits Awareness: Intellectual Problem Solving: Slow processing;Decreased initiation;Difficulty sequencing;Requires verbal cues;Requires tactile cues        General Comments General comments (skin integrity, edema, etc.): SpO2 99% on 5L O2 [place]    Exercises     Assessment/Plan    PT Assessment Patient needs continued PT services  PT Problem List Decreased strength;Decreased activity tolerance;Decreased balance;Decreased mobility;Decreased cognition;Decreased knowledge of use of DME;Cardiopulmonary status limiting activity       PT Treatment Interventions DME instruction;Gait training;Functional mobility training;Therapeutic activities;Therapeutic exercise;Balance training;Patient/family education;Wheelchair mobility training    PT Goals (Current goals can be found in the Care Plan section)  Acute Rehab PT Goals Patient Stated Goal: "I want to eat something" PT Goal Formulation: With patient Time For Goal Achievement: 10/14/17 Potential to Achieve Goals: Good    Frequency Min 2X/week   Barriers to discharge        Co-evaluation               AM-PAC PT "6 Clicks" Daily Activity  Outcome Measure Difficulty turning over in bed (including adjusting bedclothes, sheets and blankets)?: Unable Difficulty moving from lying on back to sitting on the side of the bed? : Unable Difficulty sitting down on and standing up from a chair with arms (e.g., wheelchair, bedside commode, etc,.)?: Unable Help needed moving to and from a bed to chair (including a wheelchair)?: A Lot Help needed walking in hospital room?: A Lot Help needed climbing 3-5 steps with a railing? : Total 6 Click Score: 8    End of Session Equipment Utilized During Treatment: Gait belt;Oxygen Activity Tolerance: Patient tolerated treatment  well;Patient limited by fatigue Patient left: in chair;with call bell/phone within reach;with chair alarm set Nurse Communication: Mobility status PT Visit Diagnosis: Other abnormalities of gait and mobility (R26.89);Muscle weakness (generalized) (M62.81)    Time: 1610-9604 PT Time Calculation (min) (ACUTE ONLY): 28 min   Charges:   PT Evaluation $PT Eval Moderate Complexity: 1 Mod PT Treatments $Therapeutic Activity: 8-22 mins   PT G Codes:       Ina Homes, PT, DPT Acute Rehab Services  Pager: [phone]  Malachy Chamber 09/30/2017, 9:38 AM

## 2017-09-30 NOTE — Evaluation (Signed)
Occupational Therapy Evaluation Patient Details Name: Nicole Cox MRN: [id number] DOB: 04/21/1931 Today's Date: 09/30/2017    History of Present Illness Pt is an 84 y.o. female admitted from SNF on 09/25/17 with AMS and SOB. CXR shows bilateral pulmonary infiltrates with suspicion for multifocal pneumonia; worked up for sepsis from HCAP. PMH includes CHF, CAD, COPD, DM, HTN, ESRD on HD, CVA.   Clinical Impression   Pt with decline in function and safety with ADLs and ADL mobility with decreased strength, balance and endurance. PTA, pt was at SNF participating in ST rehab and had assist with mobility and ADLs. Pt with cognitive deficits and Poor safety awareness, requiring extensive assist with selfare. Pt would benefit from acute OT services to address impairments to maximize level of function and safety    Follow Up Recommendations  SNF;Supervision/Assistance - 24 hour    Equipment Recommendations  Other (comment)(TBD at SNF)    Recommendations for Other Services       Precautions / Restrictions Precautions Precautions: Fall Restrictions Weight Bearing Restrictions: No      Mobility Bed Mobility Overal bed mobility: Needs Assistance Bed Mobility: Supine to Sit;Sit to Supine     Supine to sit: Mod assist;HOB elevated Sit to supine: Mod assist   General bed mobility comments: mod A to elevate trunk and with LEs back onto bed  Transfers Overall transfer level: Needs assistance Equipment used: 1 person hand held assist Transfers: Sit to/from Stand Sit to Stand: Max assist         General transfer comment: Pt required maxA to assist trunk elevation. Multimodal cues for bilat hip extension to get feet under her, as pt leaning posteriorly with heavy reliance on maxA to maintain trunk elevation. Bilat knee instability with poor hip extension    Balance Overall balance assessment: Needs assistance   Sitting balance-Leahy Scale: Poor Sitting balance - Comments: min  guard A at EOB for balance/support     Standing balance-Leahy Scale: Poor                             ADL either performed or assessed with clinical judgement   ADL Overall ADL's : Needs assistance/impaired     Grooming: Wash/dry hands;Wash/dry face;Minimal assistance Grooming Details (indicate cue type and reason): Poor sitting balance Upper Body Bathing: Moderate assistance;Sitting   Lower Body Bathing: Total assistance   Upper Body Dressing : Moderate assistance;Sitting   Lower Body Dressing: Total assistance   Toilet Transfer: Maximal assistance;Stand-pivot   Toileting- Clothing Manipulation and Hygiene: Total assistance       Functional mobility during ADLs: Maximal assistance       Vision Baseline Vision/History: Wears glasses Patient Visual Report: No change from baseline       Perception     Praxis      Pertinent Vitals/Pain Pain Assessment: No/denies pain     Hand Dominance Right   Extremity/Trunk Assessment Upper Extremity Assessment Upper Extremity Assessment: Generalized weakness   Lower Extremity Assessment Lower Extremity Assessment: Defer to PT evaluation   Cervical / Trunk Assessment Cervical / Trunk Assessment: Kyphotic   Communication Communication Communication: Expressive difficulties   Cognition Arousal/Alertness: Awake/alert Behavior During Therapy: Flat affect Overall Cognitive Status: No family/caregiver present to determine baseline cognitive functioning Area of Impairment: Orientation;Attention;Memory;Following commands;Safety/judgement;Awareness;Problem solving                 Orientation Level: Disoriented to;Time;Situation Current Attention Level: Sustained Memory: Decreased short-term memory  Following Commands: Follows one step commands with increased time Safety/Judgement: Decreased awareness of deficits Awareness: Intellectual Problem Solving: Slow processing;Decreased initiation;Difficulty  sequencing;Requires verbal cues;Requires tactile cues     General Comments  SpO2 99% on 5L O2 [place]    Exercises     Shoulder Instructions      Home Living Family/patient expects to be discharged to:: Skilled nursing facility                                        Prior Functioning/Environment Level of Independence: Needs assistance  Gait / Transfers Assistance Needed: Pt poor historian - reports primarily laying in bed at SNF. Requires assist for standing with RW and using w/c ADL's / Homemaking Assistance Needed: Assist for ADLs            OT Problem List: Decreased strength;Decreased activity tolerance;Decreased knowledge of use of DME or AE;Decreased cognition;Impaired balance (sitting and/or standing);Decreased coordination      OT Treatment/Interventions: Self-care/ADL training;Therapeutic exercise;DME and/or AE instruction;Therapeutic activities;Patient/family education;Neuromuscular education    OT Goals(Current goals can be found in the care plan section) Acute Rehab OT Goals Patient Stated Goal: get better OT Goal Formulation: With patient Time For Goal Achievement: 10/14/17 Potential to Achieve Goals: Good ADL Goals Pt Will Perform Grooming: with min guard assist;sitting Pt Will Perform Upper Body Bathing: with min assist;sitting Pt Will Perform Upper Body Dressing: with min assist;sitting Pt Will Transfer to Toilet: with mod assist;stand pivot transfer;bedside commode Pt Will Perform Toileting - Clothing Manipulation and hygiene: with mod assist;sit to/from stand  OT Frequency: Min 2X/week   Barriers to D/C: Decreased caregiver support          Co-evaluation              AM-PAC PT "6 Clicks" Daily Activity     Outcome Measure Help from another person eating meals?: A Little Help from another person taking care of personal grooming?: A Little Help from another person toileting, which includes using toliet, bedpan, or urinal?:  Total Help from another person bathing (including washing, rinsing, drying)?: Total Help from another person to put on and taking off regular upper body clothing?: A Lot Help from another person to put on and taking off regular lower body clothing?: Total 6 Click Score: 11   End of Session Equipment Utilized During Treatment: Gait belt;Other (comment)(BSC)  Activity Tolerance: Patient tolerated treatment well Patient left: in bed;with call bell/phone within reach  OT Visit Diagnosis: Unsteadiness on feet (R26.81);Other abnormalities of gait and mobility (R26.89);Muscle weakness (generalized) (M62.81);Other symptoms and signs involving cognitive function                Time: [id number] OT Time Calculation (min): 17 min Charges:  OT General Charges $OT Visit: 1 Visit OT Evaluation $OT Eval Moderate Complexity: 1 Mod G-Codes: OT G-codes **NOT FOR INPATIENT CLASS** Functional Assessment Tool Used: AM-PAC 6 Clicks Daily Activity     Galen ManilaSpencer, Alix Stowers Jeanette 09/30/2017, 11:30 AM

## 2017-09-30 NOTE — Progress Notes (Signed)
Modified Barium Swallow Progress Note  Patient Details  Name: Nicole Cox MRN: [id number] Date of Birth: May 13, 1931  Today's Date: 09/30/2017  Modified Barium Swallow completed.  Full report located under Chart Review in the Imaging Section.  Brief recommendations include the following:  Clinical Impression  Pt has a mild oropharyngeal dysphagia also impacted by cognitive status with impulsive rate of intake and inconsistent command following. Orally she has slow mastication due to missing dentition, delayed oral transit with lingual residue noted with purees, and premature spillage of liquids. Oral containment is not facilitate by a chin tuck, and she does not slow her rate or take single sips despite cueing from SLP. Thin liquids and nectar thick liquids by cup reach her pyriform sinuses before the swallow and are penetrated to the vocal folds without attempts to clear. Mod cues from SLP for volitional cough are not fully effective at clearing the laryngeal vestibule. While this is not necessarily abnormal for her age, her compromised respiratory system and decreased functional reserve from acute illness would make her more susceptible to negative side effects from aspiration at this time. Straw sips of nectar thick liquids spill only to the vallecula, allowing for better airway protection during the swallow. Recommend starting Dys 2 diet and nectar thick liquids via straw. Will f/u for tolerance and potential to advance as she recovers from her acute hospitalization.   Swallow Evaluation Recommendations       SLP Diet Recommendations: Dysphagia 2 (Fine chop) solids;Nectar thick liquid   Liquid Administration via: Straw   Medication Administration: Whole meds with puree   Supervision: Patient able to self feed;Full supervision/cueing for compensatory strategies   Compensations: Minimize environmental distractions;Slow rate;Small sips/bites   Postural Changes: Seated upright at 90  degrees;Remain semi-upright after after feeds/meals (Comment)   Oral Care Recommendations: Oral care BID        Maxcine Hamaiewonsky, Ashari Llewellyn 09/30/2017,11:01 AM   Maxcine HamLaura Paiewonsky, M.A. CCC-SLP [phone]

## 2017-10-01 ENCOUNTER — Inpatient Hospital Stay (HOSPITAL_COMMUNITY): Payer: Medicare HMO

## 2017-10-01 DIAGNOSIS — R6510 Systemic inflammatory response syndrome (SIRS) of non-infectious origin without acute organ dysfunction: Secondary | ICD-10-CM

## 2017-10-01 DIAGNOSIS — J156 Pneumonia due to other aerobic Gram-negative bacteria: Secondary | ICD-10-CM

## 2017-10-01 LAB — CBC
HCT: 28.7 % — ABNORMAL LOW (ref 36.0–46.0)
HEMOGLOBIN: 8.5 g/dL — AB (ref 12.0–15.0)
MCH: 26.6 pg (ref 26.0–34.0)
MCHC: 29.6 g/dL — AB (ref 30.0–36.0)
MCV: 89.7 fL (ref 78.0–100.0)
PLATELETS: 343 10*3/uL (ref 150–400)
RBC: 3.2 MIL/uL — AB (ref 3.87–5.11)
RDW: 20.3 % — ABNORMAL HIGH (ref 11.5–15.5)
WBC: 20.1 10*3/uL — ABNORMAL HIGH (ref 4.0–10.5)

## 2017-10-01 LAB — GLUCOSE, CAPILLARY
GLUCOSE-CAPILLARY: 106 mg/dL — AB (ref 70–99)
Glucose-Capillary: 110 mg/dL — ABNORMAL HIGH (ref 70–99)
Glucose-Capillary: 136 mg/dL — ABNORMAL HIGH (ref 70–99)
Glucose-Capillary: 131 mg/dL — ABNORMAL HIGH (ref 70–99)

## 2017-10-01 LAB — PROCALCITONIN: PROCALCITONIN: 37.37 ng/mL

## 2017-10-01 MED ORDER — SODIUM CHLORIDE 0.9 % IV SOLN: 1.5000 g | Freq: Two times a day (BID) | INTRAVENOUS | 0 refills | Status: DC

## 2017-10-01 MED ORDER — SODIUM CHLORIDE 0.9 % IV SOLN
250.0000 mg | Freq: Two times a day (BID) | INTRAVENOUS | Status: DC
  Administered 2017-10-01 – 2017-10-02 (×2): 250 mg via INTRAVENOUS
  Filled 2017-10-01 (×5): qty 250

## 2017-10-01 MED ORDER — GABAPENTIN 600 MG PO TABS
300.0000 mg | ORAL_TABLET | Freq: Once | ORAL | Status: AC
  Administered 2017-10-01: 300 mg via ORAL
  Filled 2017-10-01: qty 1

## 2017-10-01 MED ORDER — HYDRALAZINE HCL 20 MG/ML IJ SOLN
10.0000 mg | Freq: Four times a day (QID) | INTRAMUSCULAR | Status: DC | PRN
  Administered 2017-10-01: 10 mg via INTRAVENOUS
  Filled 2017-10-01: qty 1

## 2017-10-01 NOTE — Discharge Summary (Addendum)
Addendum.  Patient discharged on 10/01/2017.  Could not go that day as SNF did not stop Unasyn which was the primary discharge antibiotic, she was then switched to imipenem, she was to go after dialysis on 10/02/2017 but did not leave as her evening blood pressures were high and she was apparently refusing PRN medications.  This morning my blood pressure is better, patient unchanged should be discharged if SNF accepts.                                                                                 Nicole Cox ZOX:[id number] DOB: 06-Jan-1932 DOA: 09/25/2017  PCP: Joana Reamer, MD  Admit date: 09/25/2017  Discharge date: 10/03/2017  Admitted From: SNF   Disposition:  SNF   Recommendations for Outpatient Follow-up:   Follow up with PCP in 1-2 weeks  PCP Please obtain BMP/CBC, 2 view CXR in 1week,  (see Discharge instructions)   PCP Please follow up on the following pending results:    Home Health: None   Equipment/Devices: None  Consultations: Renal Discharge Condition: Fair   CODE STATUS: Full   Diet Recommendation: Dysphagia 2 - Nectar thick liquids, renal diet.  1.2 L/day total fluid restriction   Chief Complaint  Patient presents with  . Shortness of Breath     Brief history of present illness from the day of admission and additional interim summary    84 y.o.femalewith medical history significant ofchronic diastolic CHF, coronary artery disease, COPD, type II diabetes, hypertension, end-stage renal disease on dialysis Tuesday Thursday Saturday, CVA, presents to emergency department with altered level of consciousness and shortness of breath all due to Pneumonia.                                                                 Hospital Course    1.  Pneumonia with Acinetobacter bacteremia causing acute hypoxic respiratory failure and toxic encephalopathy.  Aspiration could not  be completely ruled out as she has some underlying dysphagia, seen by speech placed on dysphagia 2 diet with nectar thick liquids, was treated with empiric antibiotics, blood cultures suggestive of actinobacter bacteremia, also coag negative staph contamination.  She is now on imipenem and clinically pneumonia has defervesced, she needs 10 more days of IV antibiotics which will be done via peripheral IV at SNF, stop date 10/12/2017.  Continue speech therapy follow-up and aspiration precautions at SNF.  Continue 2 L nasal cannula oxygen and titrate as needed.  Encephalopathy is much improved.  No sepsis in my evaluation.  2.  ESRD.  On Tuesday, Thursday and Saturday dialysis schedule.  Was seen by renal maintaining her schedule.  3.  Chronic diastolic CHF.  Last EF 55% on recent echocardiogram.  Clinically not volume overloaded at this time, fluid removal through dialysis.  4.  Severe PCM.  Continue protein supplementation.  5.  Dysphagia.  Continue speech follow-up at SNF, for now dysphagia 2 diet with  nectar thick liquids, feeding assistance and aspiration precautions.  6.  Hypertension.  Continue home regimen.   Discharge diagnosis     Principal Problem:   SIRS (systemic inflammatory response syndrome) (HCC) Active Problems:   Hypertension   DM (diabetes mellitus), type 2 with renal complications (HCC)   COPD (chronic obstructive pulmonary disease) (HCC)   Acute respiratory failure with hypoxia (HCC)   ESRD on hemodialysis [facility])   Delirium    Discharge instructions    Discharge Instructions    Discharge instructions   Complete by:  As directed    Follow with Primary MD Joana Reamer, MD in 7 days   Get CBC, CMP, 2 view Chest X ray checked  by Primary MD or SNF MD in 5-7 days   Activity: As tolerated with Full fall precautions use walker/cane & assistance as needed  Disposition SNF  Diet:   Renal diet, dysphagia 2 with nectar thick liquids.  Full feeding assistance  aspiration precautions.  1.2 L/day total fluid restriction.  Special Instructions: If you have smoked or chewed Tobacco  in the last 2 yrs please stop smoking, stop any regular Alcohol  and or any Recreational drug use.  On your next visit with your primary care physician please Get Medicines reviewed and adjusted.  Please request your Prim.MD to go over all Hospital Tests and Procedure/Radiological results at the follow up, please get all Hospital records sent to your Prim MD by signing hospital release before you go home.  If you experience worsening of your admission symptoms, develop shortness of breath, life threatening emergency, suicidal or homicidal thoughts you must seek medical attention immediately by calling 911 or calling your MD immediately  if symptoms less severe.   Increase activity slowly   Complete by:  As directed       Discharge Medications   Allergies as of 10/03/2017   No Known Allergies     Medication List    STOP taking these medications   traMADol 50 MG tablet Commonly known as:  ULTRAM     TAKE these medications   albuterol 108 (90 Base) MCG/ACT inhaler Commonly known as:  PROVENTIL HFA;VENTOLIN HFA Inhale 2 puffs into the lungs every 6 (six) hours as needed for wheezing or shortness of breath.   aspirin EC 81 MG tablet Take 81 mg by mouth daily.   cloNIDine 0.2 mg/24hr patch Commonly known as:  CATAPRES - Dosed in mg/24 hr Place 1 patch (0.2 mg total) onto the skin every 7 (seven) days.   diphenhydrAMINE 25 mg capsule Commonly known as:  BENADRYL Take 25 mg by mouth every morning.   doxercalciferol 2.5 MCG capsule Commonly known as:  HECTOROL Take 1 capsule (2.5 mcg total) by mouth Every Tuesday,Thursday,and Saturday with dialysis.   feeding supplement (NEPRO CARB STEADY) Liqd Take 237 mLs by mouth 2 (two) times daily between meals.   fluticasone furoate-vilanterol 100-25 MCG/INH Aepb Commonly known as:  BREO ELLIPTA Inhale 1 puff into  the lungs daily.   imipenem-cilastatin 250 mg in sodium chloride 0.9 % 100 mL Inject 250 mg into the vein every 12 (twelve) hours for 10 days.   INCRUSE ELLIPTA 62.5 MCG/INH Aepb Generic drug:  umeclidinium bromide Inhale 1 puff into the lungs daily.   insulin detemir 100 UNIT/ML injection Commonly known as:  LEVEMIR Inject 5 Units into the skin daily.   isosorbide mononitrate 30 MG 24 hr tablet Commonly known as:  IMDUR Take 30 mg by mouth every evening.  lidocaine-prilocaine cream Commonly known as:  EMLA Apply 1 application topically every other day.   lisinopril 40 MG tablet Commonly known as:  PRINIVIL,ZESTRIL Take 40 mg by mouth every evening.   Melatonin 3 MG Tabs Take 3 mg by mouth at bedtime.   metoprolol tartrate 25 MG tablet Commonly known as:  LOPRESSOR Take 0.5 tablets (12.5 mg total) by mouth 2 (two) times daily. What changed:  when to take this   NIFEdipine 60 MG 24 hr tablet Commonly known as:  PROCARDIA XL/ADALAT-CC Take 60 mg by mouth 2 (two) times daily.   nitroGLYCERIN 0.4 MG SL tablet Commonly known as:  NITROSTAT Place 0.4 mg under the tongue See admin instructions. Mat repeat every 5 minutes. If third tab needed call 911   pravastatin 40 MG tablet Commonly known as:  PRAVACHOL Take 40 mg by mouth daily.       Contact information for after-discharge care    Destination    [facility] .   Service:  Skilled Nursing Contact information: [address] [place] [address] [phone]              Major procedures and Radiology Reports - PLEASE review detailed and final reports thoroughly  -        Dg Chest 1 View  Result Date: 09/28/2017 CLINICAL DATA:  Hypoxia. EXAM: CHEST  1 VIEW COMPARISON:  09/26/2017. FINDINGS: Cardiomegaly with diffuse bilateral pulmonary interstitial prominence consistent with pulmonary interstitial edema and/or pneumonitis. A component these changes may be chronic. Exam  unchanged from prior exam. Right base calcified pleural plaque is unchanged. No pneumothorax. IMPRESSION: Cardiomegaly. Diffuse bilateral from interstitial prominence is unchanged from prior exam as described above. Electronically Signed   By: Maisie Fus  Register   On: 09/28/2017 15:55   Dg Chest 1 View  Result Date: 09/26/2017 CLINICAL DATA:  Hypoxia EXAM: CHEST  1 VIEW COMPARISON:  06/07/2017 FINDINGS: Diffuse bilateral airspace disease with interval improvement likely due to edema. No significant effusion. Cardiac enlargement. Large calcific pleural plaque in the right lung base is unchanged. IMPRESSION: Improvement in pulmonary edema pattern. Electronically Signed   By: Marlan Palau M.D.   On: 09/26/2017 12:03   Dg Chest Port 1 View  Result Date: 10/01/2017 CLINICAL DATA:  Pneumonia. EXAM: PORTABLE CHEST 1 VIEW COMPARISON:  09/28/2017. FINDINGS: Stable cardiomegaly. Stable diffuse bilateral interstitial prominence consistent with interstitial edema and/or pneumonitis. A component these changes may be chronic. Exam is unchanged from prior exam. Stable right base calcified pleural plaque is again noted. No pneumothorax. Surgical clips upper abdomen. IMPRESSION: 1.  Stable cardiomegaly. 2. Stable diffuse bilateral interstitial edema and/or pneumonitis. A component interstitial changes may be chronic. Stable right base calcified pleural plaque. Chest is unchanged from prior exam. Electronically Signed   By: Maisie Fus  Register   On: 10/01/2017 07:57   Dg Chest Portable 1 View  Result Date: 09/25/2017 CLINICAL DATA:  Shortness of breath. EXAM: PORTABLE CHEST 1 VIEW COMPARISON:  07/27/2017 FINDINGS: Midline trachea. Moderate cardiomegaly, accentuated by low lung volumes and AP portable technique. Atherosclerosis in the transverse aorta. No definite pleural fluid. The Chin overlies the apices minimally. No pneumothorax. Moderate pulmonary interstitial prominence and indistinctness, similar. Patchy bibasilar  airspace disease is not significantly changed. More focal increased density projecting over the right lung base likely corresponds to a calcified pleural plaque when correlated with the lateral radiograph of 08/14/2012. IMPRESSION: 1. No change in moderate congestive heart failure. 2. Similar bibasilar Airspace disease,  likely atelectasis. 3. Calcified right pleural plaque, possibly related to prior empyema or hemothorax. 4.  Aortic Atherosclerosis (ICD10-I70.0). Electronically Signed   By: Jeronimo Greaves M.D.   On: 09/25/2017 19:01   Dg Swallowing Func-speech Pathology  Result Date: 09/30/2017 Objective Swallowing Evaluation: Type of Study: MBS-Modified Barium Swallow Study  Patient Details Name: Contina Strain MRN: [id number] Date of Birth: 03-06-1932 Today's Date: 09/30/2017 Time: SLP Start Time (ACUTE ONLY): 9629 -SLP Stop Time (ACUTE ONLY): 0958 SLP Time Calculation (min) (ACUTE ONLY): 19 min Past Medical History: Past Medical History: Diagnosis Date . CHF (congestive heart failure) (HCC)  . COPD (chronic obstructive pulmonary disease) (HCC)  . Coronary artery disease  . Diabetes mellitus without complication (HCC)  . Heart disease  . Hypertension  . Renal disorder  . Stroke [facility])  Past Surgical History: Past Surgical History: Procedure Laterality Date . ABDOMINAL HYSTERECTOMY   . CHOLECYSTECTOMY   . TEE WITHOUT CARDIOVERSION N/A 08/02/2017  Procedure: TRANSESOPHAGEAL ECHOCARDIOGRAM (TEE);  Surgeon: Lars Masson, MD;  Location: [facility] ENDOSCOPY;  Service: Cardiovascular;  Laterality: N/A; . TONSILLECTOMY   HPI: Pt is an 84 y.o. female who presents from SNF with AMS, SOB after dialysis. Outpatient CXR showed PNA. Pt denies any h/o dysphagia but is on a dysphagia diet per MD note and although she believes she has had PNA before she is not sure when this was. PMH: CHF, CAD, COPD, diabetes, HTN, ESRD, CVA  Subjective: pt alert, hungry/thirsty Assessment / Plan / Recommendation CHL IP CLINICAL IMPRESSIONS 09/30/2017  Clinical Impression Pt has a mild oropharyngeal dysphagia also impacted by cognitive status with impulsive rate of intake and inconsistent command following. Orally she has slow mastication due to missing dentition, delayed oral transit with lingual residue noted with purees, and premature spillage of liquids. Oral containment is not facilitate by a chin tuck, and she does not slow her rate or take single sips despite cueing from SLP. Thin liquids and nectar thick liquids by cup reach her pyriform sinuses before the swallow and are penetrated to the vocal folds without attempts to clear. Mod cues from SLP for volitional cough are not fully effective at clearing the laryngeal vestibule. While this is not necessarily abnormal for her age, her compromised respiratory system and decreased functional reserve from acute illness would make her more susceptible to negative side effects from aspiration at this time. Straw sips of nectar thick liquids spill only to the vallecula, allowing for better airway protection during the swallow. Recommend starting Dys 2 diet and nectar thick liquids via straw. Will f/u for tolerance and potential to advance as she recovers from her acute hospitalization. SLP Visit Diagnosis Dysphagia, oropharyngeal phase (R13.12) Attention and concentration deficit following -- Frontal lobe and executive function deficit following -- Impact on safety and function Moderate aspiration risk   CHL IP TREATMENT RECOMMENDATION 09/30/2017 Treatment Recommendations Therapy as outlined in treatment plan below   Prognosis 09/30/2017 Prognosis for Safe Diet Advancement Good Barriers to Reach Goals Cognitive deficits Barriers/Prognosis Comment -- CHL IP DIET RECOMMENDATION 09/30/2017 SLP Diet Recommendations Dysphagia 2 (Fine chop) solids;Nectar thick liquid Liquid Administration via Straw Medication Administration Whole meds with puree Compensations Minimize environmental distractions;Slow rate;Small sips/bites  Postural Changes Seated upright at 90 degrees;Remain semi-upright after after feeds/meals (Comment)   CHL IP OTHER RECOMMENDATIONS 09/30/2017 Recommended Consults -- Oral Care Recommendations Oral care BID Other Recommendations --   CHL IP FOLLOW UP RECOMMENDATIONS 09/30/2017 Follow up Recommendations Skilled Nursing facility   CHL IP FREQUENCY AND DURATION  09/30/2017 Speech Therapy Frequency (ACUTE ONLY) min 2x/week Treatment Duration 2 weeks      CHL IP ORAL PHASE 09/30/2017 Oral Phase Impaired Oral - Pudding Teaspoon -- Oral - Pudding Cup -- Oral - Honey Teaspoon -- Oral - Honey Cup -- Oral - Nectar Teaspoon -- Oral - Nectar Cup Premature spillage;Decreased bolus cohesion Oral - Nectar Straw Decreased bolus cohesion;Premature spillage Oral - Thin Teaspoon -- Oral - Thin Cup Decreased bolus cohesion;Premature spillage Oral - Thin Straw Decreased bolus cohesion;Premature spillage Oral - Puree Lingual/palatal residue Oral - Mech Soft Impaired mastication;Delayed oral transit Oral - Regular -- Oral - Multi-Consistency -- Oral - Pill -- Oral Phase - Comment --  CHL IP PHARYNGEAL PHASE 09/30/2017 Pharyngeal Phase Impaired Pharyngeal- Pudding Teaspoon -- Pharyngeal -- Pharyngeal- Pudding Cup -- Pharyngeal -- Pharyngeal- Honey Teaspoon -- Pharyngeal -- Pharyngeal- Honey Cup -- Pharyngeal -- Pharyngeal- Nectar Teaspoon -- Pharyngeal -- Pharyngeal- Nectar Cup Delayed swallow initiation-pyriform sinuses;Penetration/Aspiration before swallow Pharyngeal Material enters airway, CONTACTS cords and not ejected out Pharyngeal- Nectar Straw Delayed swallow initiation-vallecula Pharyngeal -- Pharyngeal- Thin Teaspoon -- Pharyngeal -- Pharyngeal- Thin Cup Delayed swallow initiation-pyriform sinuses;Penetration/Aspiration before swallow;Compensatory strategies attempted (with notebox) Pharyngeal Material enters airway, CONTACTS cords and not ejected out Pharyngeal- Thin Straw Delayed swallow initiation-pyriform  sinuses;Penetration/Aspiration before swallow Pharyngeal Material enters airway, passes BELOW cords without attempt by patient to eject out (silent aspiration) Pharyngeal- Puree WFL Pharyngeal -- Pharyngeal- Mechanical Soft WFL Pharyngeal -- Pharyngeal- Regular -- Pharyngeal -- Pharyngeal- Multi-consistency -- Pharyngeal -- Pharyngeal- Pill -- Pharyngeal -- Pharyngeal Comment --  CHL IP CERVICAL ESOPHAGEAL PHASE 09/30/2017 Cervical Esophageal Phase WFL Pudding Teaspoon -- Pudding Cup -- Honey Teaspoon -- Honey Cup -- Nectar Teaspoon -- Nectar Cup -- Nectar Straw -- Thin Teaspoon -- Thin Cup -- Thin Straw -- Puree -- Mechanical Soft -- Regular -- Multi-consistency -- Pill -- Cervical Esophageal Comment -- No flowsheet data found. Nicole Cox, Laura 09/30/2017, 11:01 AM  Nicole HamLaura Paiewonsky, M.A. CCC-SLP [phone]              Micro Results    Recent Results (from the past 240 hour(s))  Blood culture (routine x 2)     Status: Abnormal   Collection Time: 09/25/17  6:41 PM  Result Value Ref Range Status   Specimen Description BLOOD RIGHT FOREARM  Final   Special Requests   Final    BOTTLES DRAWN AEROBIC AND ANAEROBIC Blood Culture adequate volume   Culture  Setup Time   Final    GRAM POSITIVE COCCI AEROBIC BOTTLE ONLY CRITICAL RESULT CALLED TO, READ BACK BY AND VERIFIED WITH: B. MANCHERIL, RPHARMD AT 1604 ON 09/26/17 BY C. JESSUP, MLT.    Culture (A)  Final    STAPHYLOCOCCUS SPECIES (COAGULASE NEGATIVE) THE SIGNIFICANCE OF ISOLATING THIS ORGANISM FROM A SINGLE SET OF BLOOD CULTURES WHEN MULTIPLE SETS ARE DRAWN IS UNCERTAIN. PLEASE NOTIFY THE MICROBIOLOGY DEPARTMENT WITHIN ONE WEEK IF SPECIATION AND SENSITIVITIES ARE REQUIRED. Performed at [facility], [address] [place], [place] [id number]    Report Status 09/28/2017 FINAL  Final  Blood Culture ID Panel (Reflexed)     Status: Abnormal   Collection Time: 09/25/17  6:41 PM  Result Value Ref Range Status   Enterococcus species NOT  DETECTED NOT DETECTED Final   Listeria monocytogenes NOT DETECTED NOT DETECTED Final   Staphylococcus species DETECTED (A) NOT DETECTED Final    Comment: Methicillin (oxacillin) resistant coagulase negative staphylococcus. Possible blood culture contaminant (unless isolated from more than one  blood culture draw or clinical case suggests pathogenicity). No antibiotic treatment is indicated for blood  culture contaminants. CRITICAL RESULT CALLED TO, READ BACK BY AND VERIFIED WITH: B. MANCHERIL, RPHARMD AT 1604 ON 09/26/17 BY C. JESSUP, MLT.    Staphylococcus aureus NOT DETECTED NOT DETECTED Final   Methicillin resistance DETECTED (A) NOT DETECTED Final    Comment: CRITICAL RESULT CALLED TO, READ BACK BY AND VERIFIED WITH: B. MANCHERIL, RPHARMD AT 1604 ON 09/26/17 BY C. JESSUP, MLT.    Streptococcus species NOT DETECTED NOT DETECTED Final   Streptococcus agalactiae NOT DETECTED NOT DETECTED Final   Streptococcus pneumoniae NOT DETECTED NOT DETECTED Final   Streptococcus pyogenes NOT DETECTED NOT DETECTED Final   Acinetobacter baumannii NOT DETECTED NOT DETECTED Final   Enterobacteriaceae species NOT DETECTED NOT DETECTED Final   Enterobacter cloacae complex NOT DETECTED NOT DETECTED Final   Escherichia coli NOT DETECTED NOT DETECTED Final   Klebsiella oxytoca NOT DETECTED NOT DETECTED Final   Klebsiella pneumoniae NOT DETECTED NOT DETECTED Final   Proteus species NOT DETECTED NOT DETECTED Final   Serratia marcescens NOT DETECTED NOT DETECTED Final   Haemophilus influenzae NOT DETECTED NOT DETECTED Final   Neisseria meningitidis NOT DETECTED NOT DETECTED Final   Pseudomonas aeruginosa NOT DETECTED NOT DETECTED Final   Candida albicans NOT DETECTED NOT DETECTED Final   Candida glabrata NOT DETECTED NOT DETECTED Final   Candida krusei NOT DETECTED NOT DETECTED Final   Candida parapsilosis NOT DETECTED NOT DETECTED Final   Candida tropicalis NOT DETECTED NOT DETECTED Final    Comment:  Performed at [facility], [address], [place], [place] [address]  Blood culture (routine x 2)     Status: Abnormal   Collection Time: 09/25/17  6:57 PM  Result Value Ref Range Status   Specimen Description BLOOD RIGHT WRIST  Final   Special Requests   Final    BOTTLES DRAWN AEROBIC AND ANAEROBIC Blood Culture adequate volume   Culture  Setup Time   Final    GRAM NEGATIVE RODS AEROBIC BOTTLE ONLY CRITICAL RESULT CALLED TO, READ BACK BY AND VERIFIED WITH: B. MANCHERIL, RPHARMD AT 1250 ON 09/26/17 BY C. JESSUP, MLT. Performed at [facility], [address] [place], [place] [address]    Culture ACINETOBACTER BAUMANNII (A)  Final   Report Status 09/28/2017 FINAL  Final   Organism ID, Bacteria ACINETOBACTER BAUMANNII  Final      Susceptibility   Acinetobacter baumannii - MIC*    CEFTAZIDIME >=64 RESISTANT Resistant     CEFTRIAXONE >=64 RESISTANT Resistant     CIPROFLOXACIN >=4 RESISTANT Resistant     GENTAMICIN 4 SENSITIVE Sensitive     IMIPENEM 1 SENSITIVE Sensitive     PIP/TAZO >=128 RESISTANT Resistant     TRIMETH/SULFA <=20 SENSITIVE Sensitive     CEFEPIME >=64 RESISTANT Resistant     AMPICILLIN/SULBACTAM 4 SENSITIVE Sensitive     * ACINETOBACTER BAUMANNII  Blood Culture ID Panel (Reflexed)     Status: Abnormal   Collection Time: 09/25/17  6:57 PM  Result Value Ref Range Status   Enterococcus species NOT DETECTED NOT DETECTED Final   Listeria monocytogenes NOT DETECTED NOT DETECTED Final   Staphylococcus species NOT DETECTED NOT DETECTED Final   Staphylococcus aureus NOT DETECTED NOT DETECTED Final   Streptococcus species NOT DETECTED NOT DETECTED Final   Streptococcus agalactiae NOT DETECTED NOT DETECTED Final   Streptococcus pneumoniae NOT DETECTED NOT DETECTED Final   Streptococcus pyogenes NOT DETECTED  NOT DETECTED Final   Acinetobacter baumannii DETECTED (A) NOT DETECTED Final    Comment: CRITICAL RESULT CALLED TO, READ BACK BY AND VERIFIED WITH: B.  MANCHERIL, RPHARMD AT 1250 ON 09/26/17 BY C. JESSUP, MLT.    Enterobacteriaceae species NOT DETECTED NOT DETECTED Final   Enterobacter cloacae complex NOT DETECTED NOT DETECTED Final   Escherichia coli NOT DETECTED NOT DETECTED Final   Klebsiella oxytoca NOT DETECTED NOT DETECTED Final   Klebsiella pneumoniae NOT DETECTED NOT DETECTED Final   Proteus species NOT DETECTED NOT DETECTED Final   Serratia marcescens NOT DETECTED NOT DETECTED Final   Carbapenem resistance NOT DETECTED NOT DETECTED Final   Haemophilus influenzae NOT DETECTED NOT DETECTED Final   Neisseria meningitidis NOT DETECTED NOT DETECTED Final   Pseudomonas aeruginosa NOT DETECTED NOT DETECTED Final   Candida albicans NOT DETECTED NOT DETECTED Final   Candida glabrata NOT DETECTED NOT DETECTED Final   Candida krusei NOT DETECTED NOT DETECTED Final   Candida parapsilosis NOT DETECTED NOT DETECTED Final   Candida tropicalis NOT DETECTED NOT DETECTED Final    Comment: Performed at [facility], [address] [place], [place] [address]  MRSA PCR Screening     Status: None   Collection Time: 09/26/17 12:53 AM  Result Value Ref Range Status   MRSA by PCR NEGATIVE NEGATIVE Final    Comment:        The GeneXpert MRSA Assay (FDA approved for NASAL specimens only), is one component of a comprehensive MRSA colonization surveillance program. It is not intended to diagnose MRSA infection nor to guide or monitor treatment for MRSA infections. Performed at [facility], [address], [place], [place] [address]   C difficile quick scan w PCR reflex     Status: None   Collection Time: 09/26/17  2:31 PM  Result Value Ref Range Status   C Diff antigen NEGATIVE NEGATIVE Final   C Diff toxin NEGATIVE NEGATIVE Final   C Diff interpretation No C. difficile detected.  Final    Comment: Performed at [facility], [address] [place], [place] [address]  Culture, blood (routine x 2)     Status: None  (Preliminary result)   Collection Time: 09/29/17  8:14 AM  Result Value Ref Range Status   Specimen Description BLOOD RIGHT ANTECUBITAL  Final   Special Requests   Final    BOTTLES DRAWN AEROBIC AND ANAEROBIC Blood Culture adequate volume   Culture   Final    NO GROWTH 3 DAYS Performed at [facility], [address], [place], [place] [address]    Report Status PENDING  Incomplete  Culture, blood (routine x 2)     Status: None (Preliminary result)   Collection Time: 09/29/17  8:14 AM  Result Value Ref Range Status   Specimen Description BLOOD RIGHT HAND  Final   Special Requests   Final    BOTTLES DRAWN AEROBIC AND ANAEROBIC Blood Culture results may not be optimal due to an excessive volume of blood received in culture bottles   Culture  Setup Time PENDING  Incomplete   Culture   Final    NO GROWTH 3 DAYS Performed at [facility], [address], [place], [place] [address]    Report Status PENDING  Incomplete    Today   Subjective    Nicole Cox today has no headache,no chest abdominal pain,no new weakness tingling or numbness, feels much better wants to go  home today.    Objective   Blood pressure 170/50, pulse 86, temperature 98.5 F (36.9 C), temperature source Oral, resp. rate 11, height 5' 2.99" (1.6 m), weight 55.1 kg (121 lb 7.6 oz), SpO2 99 %.   Intake/Output Summary (Last 24 hours) at 10/03/2017 0726 Last data filed at 10/02/2017 1656 Gross per 24 hour  Intake -  Output 1500 ml  Net -1500 ml    Exam Awake Alert,  No new F.N deficits,   [place].AT,PERRAL Supple Neck,No JVD, No cervical lymphadenopathy appriciated.  Symmetrical Chest wall movement, Good air movement bilaterally, CTAB RRR,No Gallops,Rubs or new Murmurs, No Parasternal Heave +ve B.Sounds, Abd Soft, Non tender, No organomegaly appriciated, No rebound -guarding or rigidity. No Cyanosis, Clubbing or edema, No new Rash or bruise   Data Review   CBC w Diff:  Lab Results  Component  Value Date   WBC 14.6 (H) 10/02/2017   HGB 8.3 (L) 10/02/2017   HCT 28.0 (L) 10/02/2017   PLT 443 (H) 10/02/2017   LYMPHOPCT 10 09/29/2017   MONOPCT 7 09/29/2017   EOSPCT 2 09/29/2017   BASOPCT 0 09/29/2017    CMP:  Lab Results  Component Value Date   NA 139 10/02/2017   K 4.2 10/02/2017   CL 98 10/02/2017   CO2 30 10/02/2017   BUN 21 10/02/2017   CREATININE 5.30 (H) 10/02/2017   PROT 6.5 09/25/2017   ALBUMIN 2.1 (L) 10/02/2017   BILITOT 1.2 09/25/2017   ALKPHOS 106 09/25/2017   AST 111 (H) 09/25/2017   ALT 45 (H) 09/25/2017  .   Total Time in preparing paper work, data evaluation and todays exam - 35 minutes  Susa Raring M.D on 10/03/2017 at 7:26 AM  [facility]   Office  [phone]

## 2017-10-01 NOTE — Progress Notes (Signed)
SNF contacted CSW stating they do not have access to any products with ampicillin due to a pharmacy shortage and are unable to order med for patient. MD aware. CSW left voicemail for patient's daughter, [name], requesting a call back to let her know patient is not discharging.   Nicole Cascoadia Newell Wafer LCSW [phone]

## 2017-10-01 NOTE — Progress Notes (Signed)
Insurance authorization received for patient to discharge to SNF today ([id number]).  Osborne Cascoadia Ryonna Cimini LCSW [phone]

## 2017-10-01 NOTE — Progress Notes (Signed)
Dialysis:[facility] AV graft, heparin 2000   Impression: 1Sepsis/wbc/ pulm infiltrates/ +acinetobacter bacteremia -  2 ESRD TTS HD. Had-1L7/18---volume overload improved(weight 55.1kg today) 3 HTN - on mult bp meds at home., BP stable here 4 DM on insulin 5 COPD /Hist of recurrent PNA 6 H/o CVA  Subjective: Interval History:  [name] HD yest.  Overall better.  Weight down.  Objective: Vital signs in last 24 hours: Temp:  [97.8 F (36.6 C)-98.9 F (37.2 C)] 98.5 F (36.9 C) (07/19 0816) Pulse Rate:  [85-114] 86 (07/19 0816) Resp:  [11-20] 11 (07/19 0816) BP: (135-175)/(39-92) 175/39 (07/19 0816) SpO2:  [99 %-100 %] 99 % (07/19 0816) Weight:  [55.1 kg (121 lb 7.6 oz)-56 kg (123 lb 7.3 oz)] 55.1 kg (121 lb 7.6 oz) (07/18 1909) Weight change:   Intake/Output from previous day: 07/18 0701 - 07/19 0700 In: -  Out: 1000  Intake/Output this shift: No intake/output data recorded.  General appearance: alert and cooperative Resp: clear to auscultation bilaterally Chest wall: no tenderness Cardio: regular rate and rhythm, S1, S2 normal, no murmur, click, rub or gallop  No CCE, AVF LUE Lab Results: Recent Labs    09/30/17 1809 10/01/17 0250  WBC 20.0* 20.1*  HGB 8.1* 8.5*  HCT 27.8* 28.7*  PLT 328 343   BMET:  Recent Labs    09/30/17 1808  NA 143  K 4.2  CL 100  CO2 29  GLUCOSE 131*  BUN 32*  CREATININE 5.74*  CALCIUM 8.8*   No results for input(s): PTH in the last 72 hours. Iron Studies: No results for input(s): IRON, TIBC, TRANSFERRIN, FERRITIN in the last 72 hours. Studies/Results: Dg Chest Port 1 View  Result Date: 10/01/2017 CLINICAL DATA:  Pneumonia. EXAM: PORTABLE CHEST 1 VIEW COMPARISON:  09/28/2017. FINDINGS: Stable cardiomegaly. Stable diffuse bilateral interstitial prominence consistent with interstitial edema and/or pneumonitis. A component these changes may be chronic. Exam is unchanged from prior exam. Stable right base calcified  pleural plaque is again noted. No pneumothorax. Surgical clips upper abdomen. IMPRESSION: 1.  Stable cardiomegaly. 2. Stable diffuse bilateral interstitial edema and/or pneumonitis. A component interstitial changes may be chronic. Stable right base calcified pleural plaque. Chest is unchanged from prior exam. Electronically Signed   By: Maisie Fus  Register   On: 10/01/2017 07:57   Dg Swallowing Func-speech Pathology  Result Date: 09/30/2017 Objective Swallowing Evaluation: Type of Study: MBS-Modified Barium Swallow Study  Patient Details Name: Francena Zender MRN: [id number] Date of Birth: 1932-02-10 Today's Date: 09/30/2017 Time: SLP Start Time (ACUTE ONLY): 7829 -SLP Stop Time (ACUTE ONLY): 0958 SLP Time Calculation (min) (ACUTE ONLY): 19 min Past Medical History: Past Medical History: Diagnosis Date . CHF (congestive heart failure) (HCC)  . COPD (chronic obstructive pulmonary disease) (HCC)  . Coronary artery disease  . Diabetes mellitus without complication (HCC)  . Heart disease  . Hypertension  . Renal disorder  . Stroke [facility])  Past Surgical History: Past Surgical History: Procedure Laterality Date . ABDOMINAL HYSTERECTOMY   . CHOLECYSTECTOMY   . TEE WITHOUT CARDIOVERSION N/A 08/02/2017  Procedure: TRANSESOPHAGEAL ECHOCARDIOGRAM (TEE);  Surgeon: Lars Masson, MD;  Location: [facility] ENDOSCOPY;  Service: Cardiovascular;  Laterality: N/A; . TONSILLECTOMY   HPI: Pt is an 84 y.o. female who presents from SNF with AMS, SOB after dialysis. Outpatient CXR showed PNA. Pt denies any h/o dysphagia but is on a dysphagia diet per MD note and although she believes she has had PNA before she is not sure when this was.  PMH: CHF, CAD, COPD, diabetes, HTN, ESRD, CVA  Subjective: pt alert, hungry/thirsty Assessment / Plan / Recommendation CHL IP CLINICAL IMPRESSIONS 09/30/2017 Clinical Impression Pt has a mild oropharyngeal dysphagia also impacted by cognitive status with impulsive rate of intake and inconsistent command following.  Orally she has slow mastication due to missing dentition, delayed oral transit with lingual residue noted with purees, and premature spillage of liquids. Oral containment is not facilitate by a chin tuck, and she does not slow her rate or take single sips despite cueing from SLP. Thin liquids and nectar thick liquids by cup reach her pyriform sinuses before the swallow and are penetrated to the vocal folds without attempts to clear. Mod cues from SLP for volitional cough are not fully effective at clearing the laryngeal vestibule. While this is not necessarily abnormal for her age, her compromised respiratory system and decreased functional reserve from acute illness would make her more susceptible to negative side effects from aspiration at this time. Straw sips of nectar thick liquids spill only to the vallecula, allowing for better airway protection during the swallow. Recommend starting Dys 2 diet and nectar thick liquids via straw. Will f/u for tolerance and potential to advance as she recovers from her acute hospitalization. SLP Visit Diagnosis Dysphagia, oropharyngeal phase (R13.12) Attention and concentration deficit following -- Frontal lobe and executive function deficit following -- Impact on safety and function Moderate aspiration risk   CHL IP TREATMENT RECOMMENDATION 09/30/2017 Treatment Recommendations Therapy as outlined in treatment plan below   Prognosis 09/30/2017 Prognosis for Safe Diet Advancement Good Barriers to Reach Goals Cognitive deficits Barriers/Prognosis Comment -- CHL IP DIET RECOMMENDATION 09/30/2017 SLP Diet Recommendations Dysphagia 2 (Fine chop) solids;Nectar thick liquid Liquid Administration via Straw Medication Administration Whole meds with puree Compensations Minimize environmental distractions;Slow rate;Small sips/bites Postural Changes Seated upright at 90 degrees;Remain semi-upright after after feeds/meals (Comment)   CHL IP OTHER RECOMMENDATIONS 09/30/2017 Recommended Consults  -- Oral Care Recommendations Oral care BID Other Recommendations --   CHL IP FOLLOW UP RECOMMENDATIONS 09/30/2017 Follow up Recommendations Skilled Nursing facility   [facility] IP FREQUENCY AND DURATION 09/30/2017 Speech Therapy Frequency (ACUTE ONLY) min 2x/week Treatment Duration 2 weeks      CHL IP ORAL PHASE 09/30/2017 Oral Phase Impaired Oral - Pudding Teaspoon -- Oral - Pudding Cup -- Oral - Honey Teaspoon -- Oral - Honey Cup -- Oral - Nectar Teaspoon -- Oral - Nectar Cup Premature spillage;Decreased bolus cohesion Oral - Nectar Straw Decreased bolus cohesion;Premature spillage Oral - Thin Teaspoon -- Oral - Thin Cup Decreased bolus cohesion;Premature spillage Oral - Thin Straw Decreased bolus cohesion;Premature spillage Oral - Puree Lingual/palatal residue Oral - Mech Soft Impaired mastication;Delayed oral transit Oral - Regular -- Oral - Multi-Consistency -- Oral - Pill -- Oral Phase - Comment --  CHL IP PHARYNGEAL PHASE 09/30/2017 Pharyngeal Phase Impaired Pharyngeal- Pudding Teaspoon -- Pharyngeal -- Pharyngeal- Pudding Cup -- Pharyngeal -- Pharyngeal- Honey Teaspoon -- Pharyngeal -- Pharyngeal- Honey Cup -- Pharyngeal -- Pharyngeal- Nectar Teaspoon -- Pharyngeal -- Pharyngeal- Nectar Cup Delayed swallow initiation-pyriform sinuses;Penetration/Aspiration before swallow Pharyngeal Material enters airway, CONTACTS cords and not ejected out Pharyngeal- Nectar Straw Delayed swallow initiation-vallecula Pharyngeal -- Pharyngeal- Thin Teaspoon -- Pharyngeal -- Pharyngeal- Thin Cup Delayed swallow initiation-pyriform sinuses;Penetration/Aspiration before swallow;Compensatory strategies attempted (with notebox) Pharyngeal Material enters airway, CONTACTS cords and not ejected out Pharyngeal- Thin Straw Delayed swallow initiation-pyriform sinuses;Penetration/Aspiration before swallow Pharyngeal Material enters airway, passes BELOW cords without attempt by patient to eject out (silent aspiration) Pharyngeal-  Puree WFL  Pharyngeal -- Pharyngeal- Mechanical Soft WFL Pharyngeal -- Pharyngeal- Regular -- Pharyngeal -- Pharyngeal- Multi-consistency -- Pharyngeal -- Pharyngeal- Pill -- Pharyngeal -- Pharyngeal Comment --  CHL IP CERVICAL ESOPHAGEAL PHASE 09/30/2017 Cervical Esophageal Phase WFL Pudding Teaspoon -- Pudding Cup -- Honey Teaspoon -- Honey Cup -- Nectar Teaspoon -- Nectar Cup -- Nectar Straw -- Thin Teaspoon -- Thin Cup -- Thin Straw -- Puree -- Mechanical Soft -- Regular -- Multi-consistency -- Pill -- Cervical Esophageal Comment -- No flowsheet data found. Maxcine Hamaiewonsky, Laura 09/30/2017, 11:01 AM  Maxcine HamLaura Paiewonsky, M.A. CCC-SLP [phone]              Scheduled: . aspirin EC  81 mg Oral Daily  . Chlorhexidine Gluconate Cloth  6 each Topical Q0600  . doxercalciferol  2.5 mcg Oral Q T,Th,Sa-HD  . fluticasone furoate-vilanterol  1 puff Inhalation Daily  . heparin  5,000 Units Subcutaneous Q8H  . insulin aspart  0-9 Units Subcutaneous TID WC  . isosorbide mononitrate  30 mg Oral QPM  . lisinopril  20 mg Oral QPM  . mouth rinse  15 mL Mouth Rinse BID  . metoprolol tartrate  12.5 mg Oral Daily  . NIFEdipine  60 mg Oral BID  . pravastatin  40 mg Oral Daily  . umeclidinium bromide  1 puff Inhalation Daily     LOS: 6 days   Lauris PoagAlvin C Cayleigh Paull 10/01/2017,11:58 AM

## 2017-10-01 NOTE — Progress Notes (Signed)
Physical Therapy Treatment Patient Details Name: Nicole Cox MRN: [id number] DOB: 1931/04/19 Today's Date: 10/01/2017    History of Present Illness Pt is an 84 y.o. female admitted from SNF on 09/25/17 with AMS and SOB. CXR shows bilateral pulmonary infiltrates with suspicion for multifocal pneumonia; worked up for sepsis from HCAP. PMH includes CHF, CAD, COPD, DM, HTN, ESRD on HD, CVA.   PT Comments    Pt slowly progressing with mobility. Limited by fatigue, decreased activity tolerance and generalized weakness. Ability to assist with bed mobility improved. MaxA with bilat HHA for taking pivotal steps to recliner; bilat knee instability. Pt requires max, multimodal cues to maintain upright posture. Bowel incontinence while standing, dependent for pericare. Continue to recommend SNF-level therapies to maximize functional mobility and independence.   Follow Up Recommendations  SNF;Supervision for mobility/OOB     Equipment Recommendations  None recommended by PT    Recommendations for Other Services       Precautions / Restrictions Precautions Precautions: Fall Restrictions Weight Bearing Restrictions: No    Mobility  Bed Mobility Overal bed mobility: Needs Assistance Bed Mobility: Supine to Sit     Supine to sit: Min assist;HOB elevated        Transfers Overall transfer level: Needs assistance Equipment used: 1 person hand held assist Transfers: Sit to/from Stand Sit to Stand: Max assist         General transfer comment: Pt required maxA to assist trunk elevation. Max, multimodal cues to achieve fully upright posture, although flexed forward majority of stime standing; bilat knee instability with poor hip ext  Ambulation/Gait Ambulation/Gait assistance: Max assist Gait Distance (Feet): 1 Feet Assistive device: 2 person hand held assist Gait Pattern/deviations: Step-to pattern;Shuffle;Trunk flexed Gait velocity: Decreased   General Gait Details: Pivotal steps  to recliner with bilat HHA and maxA to maintain balance; pt flexed forward with bilat knee instability, very little effort made to correct   Stairs             Wheelchair Mobility    Modified Rankin (Stroke Patients Only)       Balance Overall balance assessment: Needs assistance   Sitting balance-Leahy Scale: Fair       Standing balance-Leahy Scale: Poor                              Cognition Arousal/Alertness: Awake/alert Behavior During Therapy: Flat affect Overall Cognitive Status: No family/caregiver present to determine baseline cognitive functioning Area of Impairment: Orientation;Attention;Memory;Following commands;Safety/judgement;Problem solving                 Orientation Level: Disoriented to;Time;Situation Current Attention Level: Sustained Memory: Decreased short-term memory Following Commands: Follows one step commands with increased time Safety/Judgement: Decreased awareness of deficits   Problem Solving: Slow processing;Decreased initiation;Difficulty sequencing;Requires verbal cues;Requires tactile cues        Exercises      General Comments        Pertinent Vitals/Pain Pain Assessment: No/denies pain    Home Living                      Prior Function            PT Goals (current goals can now be found in the care plan section) Acute Rehab PT Goals Patient Stated Goal: "Get out of here" PT Goal Formulation: With patient Time For Goal Achievement: 10/14/17 Potential to Achieve Goals: Good Progress towards PT goals:  Progressing toward goals    Frequency    Min 2X/week      PT Plan Current plan remains appropriate    Co-evaluation              AM-PAC PT "6 Clicks" Daily Activity  Outcome Measure  Difficulty turning over in bed (including adjusting bedclothes, sheets and blankets)?: A Little Difficulty moving from lying on back to sitting on the side of the bed? : Unable Difficulty  sitting down on and standing up from a chair with arms (e.g., wheelchair, bedside commode, etc,.)?: Unable Help needed moving to and from a bed to chair (including a wheelchair)?: A Lot Help needed walking in hospital room?: A Lot Help needed climbing 3-5 steps with a railing? : Total 6 Click Score: 10    End of Session Equipment Utilized During Treatment: Gait belt;Oxygen Activity Tolerance: Patient limited by fatigue Patient left: in chair;with call bell/phone within reach;with chair alarm set Nurse Communication: Mobility status PT Visit Diagnosis: Other abnormalities of gait and mobility (R26.89);Muscle weakness (generalized) (M62.81)     Time: 1710-1730 PT Time Calculation (min) (ACUTE ONLY): 20 min  Charges:  $Therapeutic Activity: 8-22 mins                    G Codes:      Ina HomesJaclyn Maxamilian Amadon, PT, DPT Acute Rehab Services  Pager: [phone]  Malachy ChamberJaclyn L Marshell Rieger 10/01/2017, 5:42 PM

## 2017-10-01 NOTE — Progress Notes (Signed)
  Speech Language Pathology Treatment: Dysphagia  Patient Details Name: Nicole Cox MRN: [id number] DOB: 1931-10-20 Today's Date: 10/01/2017 Time: [id number] SLP Time Calculation (min) (ACUTE ONLY): 11 min  Assessment / Plan / Recommendation Clinical Impression  Treatment focused on pt/family education as pt had just finished eating/drinking and was needing assistance with toileting/cleaning. Pt's daughter was present this morning and does share that she was on thin liquids at SNF. Education was provided about MBS completed on previous date, rationale for thickened liquids, and prognosis for return to thin liquids. Pt reiterates that she "doesn't get choked" and SLP reinforced the lack of coughing and overt signs of decreased airway protection on testing on previous date. All questions were answered at this time and pt/daughter are agreeable to tx plan. Will continue follow. Pt would benefit from additional SLP f/u post-discharge as well to maximize cough and facilitate return to thin liquids with improvements in overall endurance and function.   HPI HPI: Pt is an 84 y.o. female who presents from SNF with AMS, SOB after dialysis. Outpatient CXR showed PNA. Pt denies any h/o dysphagia but is on a dysphagia diet per MD note and although she believes she has had PNA before she is not sure when this was. PMH: CHF, CAD, COPD, diabetes, HTN, ESRD, CVA      SLP Plan  Continue with current plan of care       Recommendations  Diet recommendations: Dysphagia 2 (fine chop);Nectar-thick liquid Liquids provided via: Straw Medication Administration: Whole meds with puree Supervision: Patient able to self feed;Full supervision/cueing for compensatory strategies Compensations: Minimize environmental distractions;Slow rate;Small sips/bites Postural Changes and/or Swallow Maneuvers: Seated upright 90 degrees;Upright 30-60 min after meal                Oral Care Recommendations: Oral care BID Follow  up Recommendations: Skilled Nursing facility SLP Visit Diagnosis: Dysphagia, oropharyngeal phase (R13.12) Plan: Continue with current plan of care       GO                Maxcine Hamaiewonsky, Jamaul Heist 10/01/2017, 10:10 AM  Maxcine HamLaura Paiewonsky, M.A. CCC-SLP [phone]

## 2017-10-01 NOTE — Progress Notes (Signed)
SNF able to accept patient on Primaxin and can have it tomorrow.   Osborne Cascoadia Draco Malczewski LCSW [phone]

## 2017-10-01 NOTE — Plan of Care (Signed)
Discussed plan of care with patient.  Patient only responded when I asked her to tell me her full name and birthday.  She answered correctly, but those were the only words she spoke for this RN.  No teach back displayed.

## 2017-10-01 NOTE — Progress Notes (Addendum)
8:30am-[facility] is able to accept patient on peripheral iv.   8:26am-CSW left voicemail for [facility] to see if patient can return with peripheral iv.   Osborne Cascoadia Miette Molenda LCSW [phone]

## 2017-10-01 NOTE — Plan of Care (Signed)
Patient continues to "moan" while awake.  Says her feet are hurting.  Pain meds have been given .  Although she can state her name, date of birth, current month, and her location, she cannot provide any teach back.

## 2017-10-01 NOTE — Discharge Instructions (Signed)
Follow with Primary MD [name], J Stewart, MD in 7 days   Get CBC, CMP, 2 view Chest X ray checked  by Primary MD or SNF MD in 5-7 days   Activity: As tolerated with Full fall precautions use walker/cane & assistance as needed  Disposition SNF  Diet:   Renal diet, dysphagia 2 with nectar thick liquids.  Full feeding assistance aspiration precautions.  1.2 L/day total fluid restriction.  Special Instructions: If you have smoked or chewed Tobacco  in the last 2 yrs please stop smoking, stop any regular Alcohol  and or any Recreational drug use.  On your next visit with your primary care physician please Get Medicines reviewed and adjusted.  Please request your Prim.MD to go over all Hospital Tests and Procedure/Radiological results at the follow up, please get all Hospital records sent to your Prim MD by signing hospital release before you go home.  If you experience worsening of your admission symptoms, develop shortness of breath, life threatening emergency, suicidal or homicidal thoughts you must seek medical attention immediately by calling 911 or calling your MD immediately  if symptoms less severe.

## 2017-10-02 LAB — RENAL FUNCTION PANEL
ALBUMIN: 2.1 g/dL — AB (ref 3.5–5.0)
Anion gap: 11 (ref 5–15)
BUN: 21 mg/dL (ref 8–23)
CALCIUM: 8.7 mg/dL — AB (ref 8.9–10.3)
CHLORIDE: 98 mmol/L (ref 98–111)
CO2: 30 mmol/L (ref 22–32)
CREATININE: 5.3 mg/dL — AB (ref 0.44–1.00)
GFR calc Af Amer: 8 mL/min — ABNORMAL LOW (ref >60)
GFR calc non Af Amer: 7 mL/min — ABNORMAL LOW (ref >60)
Glucose, Bld: 92 mg/dL (ref 70–99)
PHOSPHORUS: 4 mg/dL (ref 2.5–4.6)
POTASSIUM: 4.2 mmol/L (ref 3.5–5.1)
Sodium: 139 mmol/L (ref 135–145)

## 2017-10-02 LAB — CBC
HCT: 28 % — ABNORMAL LOW (ref 36.0–46.0)
Hemoglobin: 8.3 g/dL — ABNORMAL LOW (ref 12.0–15.0)
MCH: 26.6 pg (ref 26.0–34.0)
MCHC: 29.6 g/dL — ABNORMAL LOW (ref 30.0–36.0)
MCV: 89.7 fL (ref 78.0–100.0)
PLATELETS: 443 10*3/uL — AB (ref 150–400)
RBC: 3.12 MIL/uL — AB (ref 3.87–5.11)
RDW: 20.9 % — AB (ref 11.5–15.5)
WBC: 14.6 10*3/uL — AB (ref 4.0–10.5)

## 2017-10-02 LAB — GLUCOSE, CAPILLARY
GLUCOSE-CAPILLARY: 139 mg/dL — AB (ref 70–99)
GLUCOSE-CAPILLARY: 89 mg/dL (ref 70–99)
GLUCOSE-CAPILLARY: 122 mg/dL — AB (ref 70–99)

## 2017-10-02 MED ORDER — ALPRAZOLAM 0.5 MG PO TABS
0.5000 mg | ORAL_TABLET | Freq: Once | ORAL | Status: AC
  Administered 2017-10-02: 0.5 mg via ORAL
  Filled 2017-10-02: qty 1

## 2017-10-02 MED ORDER — DOXERCALCIFEROL 4 MCG/2ML IV SOLN
INTRAVENOUS | Status: AC
  Administered 2017-10-02: 2.5 ug via INTRAVASCULAR
  Filled 2017-10-02: qty 2

## 2017-10-02 MED ORDER — SODIUM CHLORIDE 0.9 % IV SOLN: 250.0000 mg | Freq: Two times a day (BID) | INTRAVENOUS | Status: AC

## 2017-10-02 MED ORDER — CHLORHEXIDINE GLUCONATE CLOTH 2 % EX PADS
6.0000 | MEDICATED_PAD | Freq: Every day | CUTANEOUS | Status: DC
  Administered 2017-10-02 – 2017-10-03 (×2): 6 via TOPICAL

## 2017-10-02 NOTE — Progress Notes (Signed)
Patient returned from hemodialysis to room 2w03. Patient alert, oriented, and verbal. Vital signs stable. Patient denies any pain or discomfort.Will cont. To monitor

## 2017-10-02 NOTE — Progress Notes (Signed)
Verbal report given to Brendolyn PattyQtu Nguyen RN at [facility]

## 2017-10-02 NOTE — Progress Notes (Signed)
Upon assessment, patient noted to be lethargic and aroused with tactile  And verbal stimulation. Unable to administer AM P.O. Medication at this time.  MD made aware. Will continue to monitor.

## 2017-10-03 LAB — GLUCOSE, CAPILLARY: GLUCOSE-CAPILLARY: 97 mg/dL (ref 70–99)

## 2017-10-03 MED ORDER — CLONIDINE HCL 0.2 MG/24HR TD PTWK
0.2000 mg | MEDICATED_PATCH | TRANSDERMAL | Status: DC
  Administered 2017-10-03: 0.2 mg via TRANSDERMAL
  Filled 2017-10-03: qty 1

## 2017-10-03 NOTE — Progress Notes (Signed)
Patient refused to has iv saline lock placed x 3.

## 2017-10-03 NOTE — Progress Notes (Signed)
Clinical Social Worker facilitated patient discharge including contacting patient family and facility to confirm patient discharge plans.  Clinical information faxed to facility and family agreeable with plan.  CSW [facility] [place]  RN to call for report prior to discharge [phone].  Clinical Social Worker will sign off for now as social work intervention is no longer needed. Please consult us again if new need arises.  Mahamad Prudence DavidsonJabbi LCSWA  MSW [phone]

## 2017-10-03 NOTE — Progress Notes (Addendum)
Impression: 1Sepsis/wbc/ pulm infiltrates/ +acinetobacter bacteremia -  2 ESRD TTS HD. Had-1.5L7/20---volume overload improved(weight 54kg today)--was 61.7 on 7/13 3 HTN - on mult bp meds at home., BP generous 4 DM on insulin 5 COPD /Hist of recurrent PNA 6 H/o CVA  Subjective: Interval History: none.  Objective: Vital signs in last 24 hours: Temp:  [98 F (36.7 C)-99.3 F (37.4 C)] 99.3 F (37.4 C) (07/21 0300) Pulse Rate:  [75-94] 87 (07/21 0400) Resp:  [15-20] 18 (07/21 0400) BP: (141-195)/(43-77) 159/48 (07/21 0400) SpO2:  [98 %-100 %] 100 % (07/21 0730) Weight:  [54 kg (119 lb 0.8 oz)-55.5 kg (122 lb 5.7 oz)] 54 kg (119 lb 0.8 oz) (07/20 1656) Weight change:   Intake/Output from previous day: 07/20 0701 - 07/21 0700 In: -  Out: 1500  Intake/Output this shift: No intake/output data recorded.  General appearance: alert and cooperative Resp: clear to auscultation bilaterally Cardio: regular rate and rhythm, S1, S2 normal, no murmur, click, rub or gallop Extremities: extremities normal, atraumatic, no cyanosis or edema  Lab Results: Recent Labs    10/01/17 0250 10/02/17 1417  WBC 20.1* 14.6*  HGB 8.5* 8.3*  HCT 28.7* 28.0*  PLT 343 443*   BMET:  Recent Labs    09/30/17 1808 10/02/17 1417  NA 143 139  K 4.2 4.2  CL 100 98  CO2 29 30  GLUCOSE 131* 92  BUN 32* 21  CREATININE 5.74* 5.30*  CALCIUM 8.8* 8.7*   No results for input(s): PTH in the last 72 hours. Iron Studies: No results for input(s): IRON, TIBC, TRANSFERRIN, FERRITIN in the last 72 hours. Studies/Results: No results found.  Scheduled: . aspirin EC  81 mg Oral Daily  . Chlorhexidine Gluconate Cloth  6 each Topical Q0600  . Chlorhexidine Gluconate Cloth  6 each Topical Q0600  . cloNIDine  0.2 mg Transdermal Q Sun  . doxercalciferol  2.5 mcg Oral Q T,Th,Sa-HD  . fluticasone furoate-vilanterol  1 puff Inhalation Daily  . heparin  5,000 Units Subcutaneous Q8H  . insulin aspart   0-9 Units Subcutaneous TID WC  . isosorbide mononitrate  30 mg Oral QPM  . lisinopril  20 mg Oral QPM  . mouth rinse  15 mL Mouth Rinse BID  . metoprolol tartrate  12.5 mg Oral Daily  . NIFEdipine  60 mg Oral BID  . pravastatin  40 mg Oral Daily  . umeclidinium bromide  1 puff Inhalation Daily     LOS: 8 days   Lauris Poaglvin C Mystique Bjelland 10/03/2017,8:04 AM

## 2017-10-03 NOTE — Progress Notes (Signed)
[facility] arrived @  2000 to transport patient back to facility. Patient family arrived simultaneously, 4 adults. Patient became very anxious wanting to go home with family  HR 103-118 irregular B/p elevated 180-200 systolic.  PM scheduled B/p meds given 2100 one time xanax form anxiety given 2338 ultram given for foot pain. [facility] would not transport at this time and states they will return if called when patients VS stabilize. Continue to monitor VS.

## 2017-10-03 NOTE — Progress Notes (Signed)
Pt seen by MD, orders written for discharge. RN went over discharge instructions with pt and answered all questions. Pt had no IVs at time of discharge.Pt being transferred to [facility] by [facility]. She continues to be on 2L of O2. RN called facility and gave report about pt's hosptial stay to Pu RN.

## 2017-10-03 NOTE — Procedures (Signed)
[name] HD without any hemodynamic issues.  She is alert. Lauris PoagAlvin C Tashe Purdon

## 2017-10-03 NOTE — Progress Notes (Signed)
Patient calm resting quietly B/p 163/50 HR 85. [facility] called in reference to patient condition and discharge. Staff nurse stated the RN Supervisor has left for the night. Charge nurse made aware.

## 2017-10-04 LAB — CULTURE, BLOOD (ROUTINE X 2)
CULTURE: NO GROWTH
CULTURE: NO GROWTH
SPECIAL REQUESTS: ADEQUATE

## 2017-10-15 ENCOUNTER — Encounter (HOSPITAL_COMMUNITY): Payer: Self-pay

## 2017-10-15 ENCOUNTER — Other Ambulatory Visit: Payer: Self-pay

## 2017-10-15 ENCOUNTER — Emergency Department (HOSPITAL_COMMUNITY): Payer: Medicare HMO

## 2017-10-15 ENCOUNTER — Inpatient Hospital Stay (HOSPITAL_COMMUNITY)
Admission: EM | Admit: 2017-10-15 | Discharge: 2017-10-27 | DRG: 871 | Disposition: A | Discharge status: Skilled Nursing Facility | Payer: Medicare HMO | Source: Skilled Nursing Facility | Attending: Internal Medicine | Admitting: Internal Medicine

## 2017-10-15 DIAGNOSIS — I132 Hypertensive heart and chronic kidney disease with heart failure and with stage 5 chronic kidney disease, or end stage renal disease: Secondary | ICD-10-CM | POA: Diagnosis present

## 2017-10-15 DIAGNOSIS — E872 Acidosis: Secondary | ICD-10-CM | POA: Diagnosis present

## 2017-10-15 DIAGNOSIS — I4819 Other persistent atrial fibrillation: Secondary | ICD-10-CM

## 2017-10-15 DIAGNOSIS — Z87891 Personal history of nicotine dependence: Secondary | ICD-10-CM

## 2017-10-15 DIAGNOSIS — R680 Hypothermia, not associated with low environmental temperature: Secondary | ICD-10-CM | POA: Diagnosis not present

## 2017-10-15 DIAGNOSIS — N186 End stage renal disease: Secondary | ICD-10-CM | POA: Diagnosis not present

## 2017-10-15 DIAGNOSIS — R4702 Dysphasia: Secondary | ICD-10-CM | POA: Diagnosis present

## 2017-10-15 DIAGNOSIS — Z794 Long term (current) use of insulin: Secondary | ICD-10-CM

## 2017-10-15 DIAGNOSIS — Z79890 Hormone replacement therapy (postmenopausal): Secondary | ICD-10-CM

## 2017-10-15 DIAGNOSIS — E1165 Type 2 diabetes mellitus with hyperglycemia: Secondary | ICD-10-CM | POA: Diagnosis present

## 2017-10-15 DIAGNOSIS — I481 Persistent atrial fibrillation: Secondary | ICD-10-CM | POA: Diagnosis present

## 2017-10-15 DIAGNOSIS — Z833 Family history of diabetes mellitus: Secondary | ICD-10-CM

## 2017-10-15 DIAGNOSIS — Z515 Encounter for palliative care: Secondary | ICD-10-CM | POA: Diagnosis not present

## 2017-10-15 DIAGNOSIS — D631 Anemia in chronic kidney disease: Secondary | ICD-10-CM | POA: Diagnosis present

## 2017-10-15 DIAGNOSIS — Z992 Dependence on renal dialysis: Secondary | ICD-10-CM | POA: Diagnosis not present

## 2017-10-15 DIAGNOSIS — E8889 Other specified metabolic disorders: Secondary | ICD-10-CM | POA: Diagnosis present

## 2017-10-15 DIAGNOSIS — R1312 Dysphagia, oropharyngeal phase: Secondary | ICD-10-CM

## 2017-10-15 DIAGNOSIS — Z90710 Acquired absence of both cervix and uterus: Secondary | ICD-10-CM

## 2017-10-15 DIAGNOSIS — Z841 Family history of disorders of kidney and ureter: Secondary | ICD-10-CM

## 2017-10-15 DIAGNOSIS — R4189 Other symptoms and signs involving cognitive functions and awareness: Secondary | ICD-10-CM | POA: Diagnosis present

## 2017-10-15 DIAGNOSIS — G9340 Encephalopathy, unspecified: Secondary | ICD-10-CM | POA: Diagnosis not present

## 2017-10-15 DIAGNOSIS — I340 Nonrheumatic mitral (valve) insufficiency: Secondary | ICD-10-CM | POA: Diagnosis not present

## 2017-10-15 DIAGNOSIS — M79676 Pain in unspecified toe(s): Secondary | ICD-10-CM

## 2017-10-15 DIAGNOSIS — J690 Pneumonitis due to inhalation of food and vomit: Secondary | ICD-10-CM | POA: Diagnosis present

## 2017-10-15 DIAGNOSIS — Z79899 Other long term (current) drug therapy: Secondary | ICD-10-CM

## 2017-10-15 DIAGNOSIS — Z8249 Family history of ischemic heart disease and other diseases of the circulatory system: Secondary | ICD-10-CM

## 2017-10-15 DIAGNOSIS — R6520 Severe sepsis without septic shock: Secondary | ICD-10-CM | POA: Diagnosis present

## 2017-10-15 DIAGNOSIS — E1122 Type 2 diabetes mellitus with diabetic chronic kidney disease: Secondary | ICD-10-CM | POA: Diagnosis present

## 2017-10-15 DIAGNOSIS — A419 Sepsis, unspecified organism: Secondary | ICD-10-CM | POA: Diagnosis present

## 2017-10-15 DIAGNOSIS — G92 Toxic encephalopathy: Secondary | ICD-10-CM | POA: Diagnosis present

## 2017-10-15 DIAGNOSIS — A021 Salmonella sepsis: Secondary | ICD-10-CM | POA: Diagnosis not present

## 2017-10-15 DIAGNOSIS — R4182 Altered mental status, unspecified: Secondary | ICD-10-CM | POA: Diagnosis not present

## 2017-10-15 DIAGNOSIS — J449 Chronic obstructive pulmonary disease, unspecified: Secondary | ICD-10-CM | POA: Diagnosis present

## 2017-10-15 DIAGNOSIS — Z823 Family history of stroke: Secondary | ICD-10-CM

## 2017-10-15 DIAGNOSIS — E1152 Type 2 diabetes mellitus with diabetic peripheral angiopathy with gangrene: Secondary | ICD-10-CM | POA: Diagnosis present

## 2017-10-15 DIAGNOSIS — J9601 Acute respiratory failure with hypoxia: Secondary | ICD-10-CM

## 2017-10-15 DIAGNOSIS — R34 Anuria and oliguria: Secondary | ICD-10-CM | POA: Diagnosis present

## 2017-10-15 DIAGNOSIS — R159 Full incontinence of feces: Secondary | ICD-10-CM | POA: Diagnosis present

## 2017-10-15 DIAGNOSIS — I2721 Secondary pulmonary arterial hypertension: Secondary | ICD-10-CM | POA: Diagnosis present

## 2017-10-15 DIAGNOSIS — I480 Paroxysmal atrial fibrillation: Secondary | ICD-10-CM | POA: Diagnosis not present

## 2017-10-15 DIAGNOSIS — T426X5A Adverse effect of other antiepileptic and sedative-hypnotic drugs, initial encounter: Secondary | ICD-10-CM | POA: Diagnosis present

## 2017-10-15 DIAGNOSIS — L89152 Pressure ulcer of sacral region, stage 2: Secondary | ICD-10-CM | POA: Diagnosis present

## 2017-10-15 DIAGNOSIS — I96 Gangrene, not elsewhere classified: Secondary | ICD-10-CM | POA: Diagnosis not present

## 2017-10-15 DIAGNOSIS — J9621 Acute and chronic respiratory failure with hypoxia: Secondary | ICD-10-CM | POA: Diagnosis present

## 2017-10-15 DIAGNOSIS — R7989 Other specified abnormal findings of blood chemistry: Secondary | ICD-10-CM | POA: Diagnosis present

## 2017-10-15 DIAGNOSIS — F0390 Unspecified dementia without behavioral disturbance: Secondary | ICD-10-CM | POA: Diagnosis present

## 2017-10-15 DIAGNOSIS — I739 Peripheral vascular disease, unspecified: Secondary | ICD-10-CM | POA: Diagnosis not present

## 2017-10-15 DIAGNOSIS — J181 Lobar pneumonia, unspecified organism: Secondary | ICD-10-CM | POA: Diagnosis not present

## 2017-10-15 DIAGNOSIS — Z7982 Long term (current) use of aspirin: Secondary | ICD-10-CM

## 2017-10-15 DIAGNOSIS — R404 Transient alteration of awareness: Secondary | ICD-10-CM | POA: Diagnosis not present

## 2017-10-15 DIAGNOSIS — I70261 Atherosclerosis of native arteries of extremities with gangrene, right leg: Secondary | ICD-10-CM | POA: Diagnosis present

## 2017-10-15 DIAGNOSIS — I5033 Acute on chronic diastolic (congestive) heart failure: Secondary | ICD-10-CM | POA: Diagnosis present

## 2017-10-15 DIAGNOSIS — Z8673 Personal history of transient ischemic attack (TIA), and cerebral infarction without residual deficits: Secondary | ICD-10-CM

## 2017-10-15 DIAGNOSIS — Z9049 Acquired absence of other specified parts of digestive tract: Secondary | ICD-10-CM

## 2017-10-15 DIAGNOSIS — Z66 Do not resuscitate: Secondary | ICD-10-CM | POA: Diagnosis not present

## 2017-10-15 DIAGNOSIS — Z1624 Resistance to multiple antibiotics: Secondary | ICD-10-CM | POA: Diagnosis present

## 2017-10-15 LAB — CBC WITH DIFFERENTIAL/PLATELET
BASOS ABS: 0.2 10*3/uL — AB (ref 0.0–0.1)
BASOS PCT: 0 %
Basophils Absolute: 0 10*3/uL (ref 0.0–0.1)
Basophils Relative: 1 %
EOS ABS: 0.2 10*3/uL (ref 0.0–0.7)
EOS PCT: 0 %
Eosinophils Absolute: 0 10*3/uL (ref 0.0–0.7)
Eosinophils Relative: 1 %
HCT: 31.3 % — ABNORMAL LOW (ref 36.0–46.0)
HEMATOCRIT: 30.7 % — AB (ref 36.0–46.0)
HEMOGLOBIN: 8.7 g/dL — AB (ref 12.0–15.0)
Hemoglobin: 8.9 g/dL — ABNORMAL LOW (ref 12.0–15.0)
LYMPHS ABS: 1.8 10*3/uL (ref 0.7–4.0)
LYMPHS ABS: 2 10*3/uL (ref 0.7–4.0)
Lymphocytes Relative: 11 %
Lymphocytes Relative: 13 %
MCH: 26.3 pg (ref 26.0–34.0)
MCH: 26.4 pg (ref 26.0–34.0)
MCHC: 28.4 g/dL — AB (ref 30.0–36.0)
MCHC: 28.3 g/dL — ABNORMAL LOW (ref 30.0–36.0)
MCV: 92.6 fL (ref 78.0–100.0)
MCV: 93 fL (ref 78.0–100.0)
MONO ABS: 1.3 10*3/uL — AB (ref 0.1–1.0)
MONOS PCT: 8 %
Monocytes Absolute: 1.7 10*3/uL — ABNORMAL HIGH (ref 0.1–1.0)
Monocytes Relative: 11 %
NEUTROS ABS: 11.4 10*3/uL — AB (ref 1.7–7.7)
Neutro Abs: 13 10*3/uL — ABNORMAL HIGH (ref 1.7–7.7)
Neutrophils Relative %: 81 %
Neutrophils Relative %: 74 %
PLATELETS: 300 10*3/uL (ref 150–400)
Platelets: 256 10*3/uL (ref 150–400)
RBC: 3.38 MIL/uL — AB (ref 3.87–5.11)
RBC: 3.3 MIL/uL — ABNORMAL LOW (ref 3.87–5.11)
RDW: 22.7 % — ABNORMAL HIGH (ref 11.5–15.5)
RDW: 22.5 % — ABNORMAL HIGH (ref 11.5–15.5)
WBC: 16.1 10*3/uL — ABNORMAL HIGH (ref 4.0–10.5)
WBC: 15.5 10*3/uL — ABNORMAL HIGH (ref 4.0–10.5)

## 2017-10-15 LAB — PROTIME-INR
INR: 1.74
Prothrombin Time: 20.2 seconds — ABNORMAL HIGH (ref 11.4–15.2)

## 2017-10-15 LAB — I-STAT CG4 LACTIC ACID, ED: LACTIC ACID, VENOUS: 3.78 mmol/L — AB (ref 0.5–1.9)

## 2017-10-15 LAB — COMPREHENSIVE METABOLIC PANEL
ALBUMIN: 2.7 g/dL — AB (ref 3.5–5.0)
ALK PHOS: 94 U/L (ref 38–126)
ALK PHOS: 95 U/L (ref 38–126)
ALT: 18 U/L (ref 0–44)
ALT: 16 U/L (ref 0–44)
ANION GAP: 16 — AB (ref 5–15)
AST: 35 U/L (ref 15–41)
AST: 26 U/L (ref 15–41)
Albumin: 2.6 g/dL — ABNORMAL LOW (ref 3.5–5.0)
Anion gap: 15 (ref 5–15)
BUN: 23 mg/dL (ref 8–23)
BUN: 30 mg/dL — AB (ref 8–23)
CALCIUM: 8.9 mg/dL (ref 8.9–10.3)
CHLORIDE: 98 mmol/L (ref 98–111)
CO2: 26 mmol/L (ref 22–32)
CO2: 23 mmol/L (ref 22–32)
CREATININE: 4.51 mg/dL — AB (ref 0.44–1.00)
CREATININE: 4.45 mg/dL — AB (ref 0.44–1.00)
Calcium: 9.1 mg/dL (ref 8.9–10.3)
Chloride: 103 mmol/L (ref 98–111)
GFR calc Af Amer: 10 mL/min — ABNORMAL LOW (ref >60)
GFR calc Af Amer: 10 mL/min — ABNORMAL LOW (ref >60)
GFR calc non Af Amer: 8 mL/min — ABNORMAL LOW (ref >60)
GFR calc non Af Amer: 8 mL/min — ABNORMAL LOW (ref >60)
GLUCOSE: 143 mg/dL — AB (ref 70–99)
Glucose, Bld: 123 mg/dL — ABNORMAL HIGH (ref 70–99)
Potassium: 4.9 mmol/L (ref 3.5–5.1)
Potassium: 4.7 mmol/L (ref 3.5–5.1)
SODIUM: 141 mmol/L (ref 135–145)
Sodium: 140 mmol/L (ref 135–145)
Total Bilirubin: 0.6 mg/dL (ref 0.3–1.2)
Total Bilirubin: 0.6 mg/dL (ref 0.3–1.2)
Total Protein: 8.4 g/dL — ABNORMAL HIGH (ref 6.5–8.1)
Total Protein: 7.7 g/dL (ref 6.5–8.1)

## 2017-10-15 LAB — LIPASE, BLOOD: Lipase: 45 U/L (ref 11–51)

## 2017-10-15 LAB — POCT I-STAT 3, ART BLOOD GAS (G3+)
Acid-Base Excess: 1 mmol/L (ref 0.0–2.0)
BICARBONATE: 26.9 mmol/L (ref 20.0–28.0)
O2 Saturation: 98 %
PCO2 ART: 46.3 mmHg (ref 32.0–48.0)
TCO2: 28 mmol/L (ref 22–32)
pH, Arterial: 7.371 (ref 7.350–7.450)
pO2, Arterial: 110 mmHg — ABNORMAL HIGH (ref 83.0–108.0)

## 2017-10-15 LAB — CBG MONITORING, ED: GLUCOSE-CAPILLARY: 148 mg/dL — AB (ref 70–99)

## 2017-10-15 LAB — LACTIC ACID, PLASMA
LACTIC ACID, VENOUS: 1.9 mmol/L (ref 0.5–1.9)
LACTIC ACID, VENOUS: 1.9 mmol/L (ref 0.5–1.9)

## 2017-10-15 LAB — I-STAT TROPONIN, ED: TROPONIN I, POC: 0.55 ng/mL — AB (ref 0.00–0.08)

## 2017-10-15 LAB — TROPONIN I: TROPONIN I: 1.3 ng/mL — AB (ref <0.03)

## 2017-10-15 LAB — GLUCOSE, CAPILLARY
Glucose-Capillary: 129 mg/dL — ABNORMAL HIGH (ref 70–99)
Glucose-Capillary: 110 mg/dL — ABNORMAL HIGH (ref 70–99)

## 2017-10-15 LAB — APTT: aPTT: 40 seconds — ABNORMAL HIGH (ref 24–36)

## 2017-10-15 LAB — PROCALCITONIN: Procalcitonin: 0.87 ng/mL

## 2017-10-15 LAB — MRSA PCR SCREENING: MRSA by PCR: NEGATIVE

## 2017-10-15 MED ORDER — ORAL CARE MOUTH RINSE
15.0000 mL | Freq: Two times a day (BID) | OROMUCOSAL | Status: DC
  Administered 2017-10-15 – 2017-10-27 (×13): 15 mL via OROMUCOSAL

## 2017-10-15 MED ORDER — SODIUM CHLORIDE 0.9% FLUSH
3.0000 mL | Freq: Two times a day (BID) | INTRAVENOUS | Status: DC
  Administered 2017-10-15 – 2017-10-27 (×19): 3 mL via INTRAVENOUS

## 2017-10-15 MED ORDER — SODIUM CHLORIDE 0.9 % IV SOLN
1.0000 g | Freq: Once | INTRAVENOUS | Status: AC
  Administered 2017-10-15: 1 g via INTRAVENOUS
  Filled 2017-10-15: qty 1

## 2017-10-15 MED ORDER — INSULIN ASPART 100 UNIT/ML ~~LOC~~ SOLN
0.0000 [IU] | SUBCUTANEOUS | Status: DC
  Administered 2017-10-15 – 2017-10-20 (×5): 1 [IU] via SUBCUTANEOUS
  Administered 2017-10-20: 2 [IU] via SUBCUTANEOUS
  Administered 2017-10-20 – 2017-10-22 (×5): 1 [IU] via SUBCUTANEOUS
  Administered 2017-10-26: 3 [IU] via SUBCUTANEOUS
  Administered 2017-10-27: 1 [IU] via SUBCUTANEOUS

## 2017-10-15 MED ORDER — SODIUM CHLORIDE 0.9 % IV SOLN
500.0000 mg | INTRAVENOUS | Status: DC
  Administered 2017-10-16 – 2017-10-19 (×3): 500 mg via INTRAVENOUS
  Filled 2017-10-15 (×7): qty 0.5

## 2017-10-15 MED ORDER — SODIUM CHLORIDE 0.9 % IV SOLN
250.0000 mL | INTRAVENOUS | Status: DC | PRN
Start: 2017-10-15 — End: 2017-10-27
  Administered 2017-10-16 (×2): 250 mL via INTRAVENOUS

## 2017-10-15 MED ORDER — SODIUM CHLORIDE 0.9 % IV BOLUS: 1000.0000 mL | Freq: Once | INTRAVENOUS | Status: DC

## 2017-10-15 MED ORDER — HEPARIN SODIUM (PORCINE) 5000 UNIT/ML IJ SOLN
5000.0000 [IU] | Freq: Three times a day (TID) | INTRAMUSCULAR | Status: DC
  Administered 2017-10-15 – 2017-10-27 (×30): 5000 [IU] via SUBCUTANEOUS
  Filled 2017-10-15 (×28): qty 1

## 2017-10-15 MED ORDER — ARFORMOTEROL TARTRATE 15 MCG/2ML IN NEBU
15.0000 ug | INHALATION_SOLUTION | Freq: Two times a day (BID) | RESPIRATORY_TRACT | Status: DC
  Administered 2017-10-15 – 2017-10-27 (×21): 15 ug via RESPIRATORY_TRACT
  Filled 2017-10-15 (×24): qty 2

## 2017-10-15 MED ORDER — VANCOMYCIN HCL IN DEXTROSE 1-5 GM/200ML-% IV SOLN
1000.0000 mg | Freq: Once | INTRAVENOUS | Status: AC
  Administered 2017-10-15: 1000 mg via INTRAVENOUS
  Filled 2017-10-15: qty 200

## 2017-10-15 MED ORDER — ALBUMIN HUMAN 25 % IV SOLN
25.0000 g | Freq: Once | INTRAVENOUS | Status: AC
  Administered 2017-10-15 (×2): 12.5 g via INTRAVENOUS
  Filled 2017-10-15: qty 50

## 2017-10-15 MED ORDER — BUDESONIDE 0.5 MG/2ML IN SUSP
0.5000 mg | Freq: Two times a day (BID) | RESPIRATORY_TRACT | Status: DC
  Administered 2017-10-15 – 2017-10-27 (×21): 0.5 mg via RESPIRATORY_TRACT
  Filled 2017-10-15 (×23): qty 2

## 2017-10-15 MED ORDER — CHLORHEXIDINE GLUCONATE 0.12 % MT SOLN
15.0000 mL | Freq: Two times a day (BID) | OROMUCOSAL | Status: DC
  Administered 2017-10-15 – 2017-10-27 (×20): 15 mL via OROMUCOSAL
  Filled 2017-10-15 (×17): qty 15

## 2017-10-15 MED ORDER — SODIUM CHLORIDE 0.9 % IV SOLN
Freq: Once | INTRAVENOUS | Status: AC
  Administered 2017-10-15: 10:00:00 via INTRAVENOUS

## 2017-10-15 MED ORDER — SODIUM CHLORIDE 0.9 % IV BOLUS
500.0000 mL | Freq: Once | INTRAVENOUS | Status: AC
  Administered 2017-10-15: 500 mL via INTRAVENOUS

## 2017-10-15 MED ORDER — VANCOMYCIN HCL 500 MG IV SOLR
500.0000 mg | INTRAVENOUS | Status: DC
  Administered 2017-10-16: 500 mg via INTRAVENOUS
  Filled 2017-10-15 (×2): qty 500

## 2017-10-15 MED ORDER — CHLORHEXIDINE GLUCONATE CLOTH 2 % EX PADS
6.0000 | MEDICATED_PAD | Freq: Every day | CUTANEOUS | Status: DC
  Administered 2017-10-16: 6 via TOPICAL

## 2017-10-15 MED ORDER — SODIUM CHLORIDE 0.9 % IV SOLN
250.0000 mL | INTRAVENOUS | Status: DC | PRN
  Administered 2017-10-17: 250 mL via INTRAVENOUS

## 2017-10-15 MED ORDER — SODIUM CHLORIDE 0.9% FLUSH: 3.0000 mL | INTRAVENOUS | Status: DC | PRN

## 2017-10-15 MED ORDER — DARBEPOETIN ALFA 200 MCG/0.4ML IJ SOSY
200.0000 ug | PREFILLED_SYRINGE | INTRAMUSCULAR | Status: DC
  Filled 2017-10-15: qty 0.4

## 2017-10-15 MED ORDER — SODIUM CHLORIDE 0.9 % IV BOLUS: 500.0000 mL | Freq: Once | INTRAVENOUS | Status: DC

## 2017-10-15 NOTE — Progress Notes (Signed)
eLink Physician-Brief Progress Note Patient Name: Nicole DecantLizzie Cox DOB: Sep 08, 1931 MRN: [id number]   Date of Service  10/15/2017  HPI/Events of Note  Relative hypotension with MAP of 57 mmHg against a background of concern for sepsis in patient with ESRD-DD. Serum albumin 2.6 gm  eICU Interventions  25 % Albumin 100 ml iv bolus x 1        Okoron[place]wo U Ogan 10/15/2017, 10:11 PM

## 2017-10-15 NOTE — Progress Notes (Signed)
Floor RT called report.  ED RT unable to obtain ABG d/t this RT in another emergency.  Unit RT & ED MD aware.

## 2017-10-15 NOTE — Care Management Note (Signed)
Case Management Note  Patient Details  Name: Nicole Cox MRN: [id number] Date of Birth: [birth year]  Subjective/Objective:   From [facility] SN[facility],presents with sepsis with resultant encephalopathy.  Appears to have healthcare associated pneumonia, likely aspiration.                 Action/Plan: NCM will follow for transition of care needs.   Expected Discharge Date:                  Expected Discharge Plan:  Skilled Nursing Facility  In-House Referral:  Clinical Social Work  Discharge planning Services  CM Consult  Post Acute Care Choice:    Choice offered to:     DME Arranged:    DME Agency:     HH Arranged:    HH Agency:     Status of Service:  In process, will continue to follow  If discussed at Long Length of Stay Meetings, dates discussed:    Additional Comments:  Nicole Cox, Nicole Viets Clinton, RN 10/15/2017, 2:46 PM

## 2017-10-15 NOTE — Progress Notes (Addendum)
Pharmacy Antibiotic Note  Nicole Cox is a 84 y.o. female admitted on 10/15/2017 with bacteremia and sepsis.  Pharmacy has been consulted for meropenem dosing. Pt is afebrile but WBC is elevated at 16.1 and lactic acid is elevated at 3.78. Pt was supposed to continue antibiotics after hospital discharge on 7/19 but per report, pt has refused.   Plan: Meropenem 1gm IV x 1 then 500mg  IV Q24H F/u renal plans, C&S, clinical status and LOT *Narrow antibiotics as able  Addendum: Adding vancomycin. Give 1gm now then 500mg  post-HD  Height: 5\' 2"  (157.5 cm) IBW/kg (Calculated) : 50.1  Temp (24hrs), Avg:98.3 F (36.8 C), Min:98.3 F (36.8 C), Max:98.3 F (36.8 C)  Recent Labs  Lab 10/15/17 0935 10/15/17 0949  WBC 16.1*  --   LATICACIDVEN  --  3.78*    Estimated Creatinine Clearance: 6.4 mL/min (A) (by C-G formula based on SCr of 5.3 mg/dL (H)).    No Known Allergies  Antimicrobials this admission: Meropenem 8/2>>  Dose adjustments this admission: N/A  Microbiology results: 7/13 Blood - acinetobacter  Thank you for allowing pharmacy to be a part of this patient's care.  Nicole Cox, Nicole Cox 10/15/2017 10:28 AM

## 2017-10-15 NOTE — H&P (Signed)
PULMONARY / CRITICAL CARE MEDICINE   Name: Nicole Cox MRN: [id number] DOB: 10-Feb-1932    ADMISSION DATE:  10/15/2017 CONSULTATION DATE:  8/2  REFERRING MD:  Madilyn Hook  CHIEF COMPLAINT:  Sepsis and acute encephalopathy   HISTORY OF PRESENT ILLNESS:   84 year old female patient with a past medical history of end-stage renal disease, hypertension, prior stroke, heart failure with preserved ejection fraction, coronary artery disease as well as COPD.  She presents for her third hospitalization since May 2019 to the emergency room on 8/2 2019.  She was apparently just discharged on 7/20 after being hospitalized for pneumonia and Acinetobacter bacteremia.  She was to be discharged on 10-day course of imipenem however she refused IV access.  Is unclear if she received any oral therapy in place of IV. Will state of health on 7/26, however family began to notice decreased level of consciousness, poor p.o. intake, increased weakness as early as 7/27.  They initially felt this might have been related to her post dialysis treatment.  She often will be sleepy and at times disoriented after dialysis, however the symptoms continued over the course of the week.  They spoke to the nursing home physician, who had also noted she had been recently started on "something for pain" record review suggests this might of been Neurontin.  And there was concern that her excess sleepiness might have been related to this.  In spite of stopping this medication her mental status continued to decline, she was found essentially unresponsive at the nursing home on 10/15/2017 and therefore EMS was summoned.  In the emergency room she was found to be slow to arouse, opening eyes and communicating only with loud verbal stimulus.  She will follow commands but quickly falls back asleep.  Diagnostic evaluation demonstrates pulse oximetry in the mid 90s on her typical 2 L nasal cannula, white blood cell count elevated at 16.1, mildly elevated lactic  acidosis at 3.78.  Critical care asked to evaluate given concern for clinical decline.  On critical care arrival she was normotensive, heart rate with normal sinus rhythm, extremities were warm with brisk capillary refill   Sepsis - Repeat Assessment  Performed at:    13[address]  Vitals     Blood pressure (Abnormal) 114/48, pulse 86, temperature 98.3 F (36.8 C), temperature source Temporal, resp. rate 17, height 5\' 2"  (1.575 m), SpO2 100 %. Heart:     Regular rate and rhythm Lungs:    Rales Capillary Refill:   <2 sec Peripheral Pulse:   Radial pulse palpable Skin:     Normal Color   PAST MEDICAL HISTORY :  She  has a past medical history of CHF (congestive heart failure) (HCC), COPD (chronic obstructive pulmonary disease) (HCC), Coronary artery disease, Diabetes mellitus without complication (HCC), Heart disease, Hypertension, Renal disorder, and Stroke (HCC).  PAST SURGICAL HISTORY: She  has a past surgical history that includes Abdominal hysterectomy; Cholecystectomy; Tonsillectomy; and TEE without cardioversion (N/A, 08/02/2017).  No Known Allergies  No current facility-administered medications on file prior to encounter.    Current Outpatient Medications on File Prior to Encounter  Medication Sig  . acetaminophen (TYLENOL) 325 MG tablet Take 650 mg by mouth every 6 (six) hours as needed.  Marland Kitchen albuterol (PROVENTIL HFA;VENTOLIN HFA) 108 (90 Base) MCG/ACT inhaler Inhale 2 puffs into the lungs every 6 (six) hours as needed for wheezing or shortness of breath.  Marland Kitchen aspirin EC 81 MG tablet Take 81 mg by mouth daily.  . cloNIDine (CATAPRES) 0.1  MG tablet Take 0.1 mg by mouth every 12 (twelve) hours.   . diphenhydrAMINE (BENADRYL) 25 mg capsule Take 25 mg by mouth every morning.  Marland Kitchen doxercalciferol (HECTOROL) 2.5 MCG capsule Take 1 capsule (2.5 mcg total) by mouth Every Tuesday,Thursday,and Saturday with dialysis.  . fluticasone furoate-vilanterol (BREO ELLIPTA) 100-25 MCG/INH AEPB Inhale 1 puff  into the lungs daily.  Marland Kitchen gabapentin (NEURONTIN) 100 MG capsule Take 100 mg by mouth 3 (three) times daily.  . insulin detemir (LEVEMIR) 100 UNIT/ML injection Inject 5 Units into the skin daily.  . isosorbide mononitrate (IMDUR) 30 MG 24 hr tablet Take 30 mg by mouth every evening.  . lidocaine-prilocaine (EMLA) cream Apply 1 application topically every other day.  . lisinopril (PRINIVIL,ZESTRIL) 40 MG tablet Take 40 mg by mouth every evening.  . Melatonin 3 MG TABS Take 3 mg by mouth at bedtime.  . metoprolol tartrate (LOPRESSOR) 25 MG tablet Take 0.5 tablets (12.5 mg total) by mouth 2 (two) times daily. (Patient taking differently: Take 12.5 mg by mouth daily. )  . NIFEdipine (PROCARDIA XL/ADALAT-CC) 60 MG 24 hr tablet Take 60 mg by mouth 2 (two) times daily.  . nitroGLYCERIN (NITROSTAT) 0.4 MG SL tablet Place 0.4 mg under the tongue See admin instructions. Mat repeat every 5 minutes. If third tab needed call 911  . pravastatin (PRAVACHOL) 40 MG tablet Take 40 mg by mouth daily.  Marland Kitchen umeclidinium bromide (INCRUSE ELLIPTA) 62.5 MCG/INH AEPB Inhale 1 puff into the lungs daily.  . cloNIDine (CATAPRES - DOSED IN MG/24 HR) 0.2 mg/24hr patch Place 1 patch (0.2 mg total) onto the skin every 7 (seven) days. (Patient not taking: Reported on 10/15/2017)  . Nutritional Supplements (FEEDING SUPPLEMENT, NEPRO CARB STEADY,) LIQD Take 237 mLs by mouth 2 (two) times daily between meals. (Patient not taking: Reported on 10/15/2017)    FAMILY HISTORY:  Her family history includes Diabetes in her sister; Heart disease in her father, mother, sister, and sister; Hypertension in her daughter, father, mother, sister, and sister; Kidney disease in her daughter, sister, and sister; Stroke in her sister.  SOCIAL HISTORY: She  reports that she has quit smoking. She has never used smokeless tobacco. She reports that she does not drink alcohol or use drugs.  REVIEW OF SYSTEMS:   Not able  SUBJECTIVE/interval:  8/2: Admitted  with working diagnosis of sepsis with resultant encephalopathy.  Appears to have healthcare associated pneumonia which is likely aspiration in origin.  She is been started on meropenem and vancomycin given prior culture data   VITAL SIGNS: Blood Pressure (Abnormal) 130/49   Pulse 92   Temperature 98.3 F (36.8 C) (Temporal)   Respiration 17   Height 5\' 2"  (1.575 m)   Oxygen Saturation 100%   Body Mass Index 21.77 kg/m    HEMODYNAMICS:    VENTILATOR SETTINGS:    INTAKE / OUTPUT: No intake/output data recorded.  PHYSICAL EXAMINATION: General: Frail 84 year old female currently lying in bed she does not appear to be in acute distress.  She is arousable to loud voice only. Neuro: Awakens with loud stimulus, moves extremities follows commands but then quickly falls back asleep HEENT: Normocephalic atraumatic.  Mucous membranes are dry. Cardiovascular: Systolic murmur best appreciated on sternal border, regular rate and rhythm with normal sinus rhythm on telemetry, has a left AV fistula with good bruit and thrill Lungs: Fine crackles bases no accessory use Abdomen: Soft nontender no organomegaly Musculoskeletal: Equal strength and bulk Skin: Warm and dry no edema  LABS:  BMET Recent Labs  Lab 10/15/17 0935  NA 140  K 4.9  CL 98  CO2 26  BUN 23  CREATININE 4.51*  GLUCOSE 143*    Electrolytes Recent Labs  Lab 10/15/17 0935  CALCIUM 9.1    CBC Recent Labs  Lab 10/15/17 0935  WBC 16.1*  HGB 8.9*  HCT 31.3*  PLT 300    Coag's No results for input(s): APTT, INR in the last 168 hours.  Sepsis Markers Recent Labs  Lab 10/15/17 0949  LATICACIDVEN 3.78*    ABG No results for input(s): PHART, PCO2ART, PO2ART in the last 168 hours.  Liver Enzymes Recent Labs  Lab 10/15/17 0935  AST 35  ALT 18  ALKPHOS 94  BILITOT 0.6  ALBUMIN 2.7*    Cardiac Enzymes No results for input(s): TROPONINI, PROBNP in the last 168 hours.  Glucose Recent Labs  Lab  10/15/17 1133  GLUCAP 148*    Imaging Ct Head Wo Contrast  Result Date: 10/15/2017 CLINICAL DATA:  Altered level of consciousness. EXAM: CT HEAD WITHOUT CONTRAST TECHNIQUE: Contiguous axial images were obtained from the base of the skull through the vertex without intravenous contrast. COMPARISON:  CT head 07/27/2017 FINDINGS: Brain: Mild atrophy. Moderate chronic microvascular ischemic change in the white matter. Negative for acute infarct, hemorrhage, mass. No change from the prior study. Empty sella incidentally noted. Vascular: Negative for hyperdense vessel Skull: Negative Sinuses/Orbits: Interval development of right mastoid effusion and middle ear effusion. Left mastoid sinus clear. Paranasal sinuses clear. Bilateral cataract surgery. No orbital mass. Other: None IMPRESSION: Atrophy and chronic microvascular ischemia. No acute intracranial abnormality Interval development of right mastoid and middle ear effusion. Electronically Signed   By: Marlan Palau M.D.   On: 10/15/2017 11:13   Dg Chest Portable 1 View  Result Date: 10/15/2017 CLINICAL DATA:  Shortness of breath. EXAM: PORTABLE CHEST 1 VIEW COMPARISON:  10/01/2017 FINDINGS: The heart is enlarged. There is tortuosity and calcification of the thoracic aorta. Extensive interstitial changes appear relatively stable and could reflect interstitial disease or interstitial edema. No focal infiltrates. Dense calcifications at the right lung base likely pleural calcifications related to prior hemothorax or empyema. No definite pleural effusions. Chest CT may be helpful for further evaluation. IMPRESSION: Cardiac enlargement and probable interstitial edema superimposed on chronic lung disease. CT may be helpful for further evaluation. Electronically Signed   By: Rudie Meyer M.D.   On: 10/15/2017 09:42     STUDIES:    CULTURES: Blood cultures 8/2>> Urine strep antigen 8/2>>>   ANTIBIOTICS: Meropenem 8/2 Vancomycin 8/2  SIGNIFICANT  EVENTS:   LINES/TUBES:   DISCUSSION: This is a debilitated 84 year old female patient who presents from nursing facility to the emergency room with an acute encephalopathy.  She was recently discharged to the SNF after being admitted for Acinetobacter bacteremia.  She was apparently discharged with plans to be treated with a 10-day course of imipenem however she refused IV access.  Is not clear what if any antimicrobials were provided in its place.  She now presents with severe sepsis, pneumonia presumed aspiration and acute encephalopathy.  She is currently protecting her airway, normotensive but has elevated lactic acid.  We will admit her to the intensive care for now as she appears to be at very high risk for decompensation, will repeat fluid challenge, repeat lactic acid, continue antibiotics, and await cultures.  ASSESSMENT / PLAN:  Severe sepsis. Source most likely aspiration PNA vs Bacteremia vs both -recently d/c'd to SNF  7/20 after being Hospitalized for acinetobacter bacteremia and presumptive abx. Was to receive primaxin but apparently refused IV access at SNF. Not sure what antimicrobial coverage she received.  Plan Repeat 500ml fluid bolus then recheck LA. She is currently normotensive.  Limiting volume resuscitation given end-stage renal disease and chest x-ray findings Empiric Meropenem and vanc (day #1) Cultures sent; will follow up Admit to ICU  Tele monitoring Central access/pressors OK; DNR should she arrest.  Hold antihypertensives: Clonidine, Imdur, lisinopril, metoprolol, Procardia  Acute metabolic encephalopathy -Most likely secondary to sepsis, had apparently just recently been started on Neurontin for pain. Plan Hold any and all sedating medications Treat infection Frequent reorientation Supportive care  Chronic Respiratory failure in setting of COPD.  HCAP. Presume aspiration.  -2 liters at baseline -PCXR w/ patchy bilateral airspace disease right > left  looks worse c/w d/c film  Plan Supplemental oxygen abx per above  Pulse ox  Would offer short-term intubation if we did develop respiratory failure Provide scheduled Brovana and budesonide, she is on Breo and incruse at baseline  History of HTN, HFpEF also pulmonary artery HTN estimated pulmonary artery peak pressure 87 mmHg Plan Holding antihypertensives for sepsis Tele monitoring   End-stage renal disease Tuesday, Thursday Saturday dialysis.  Last dialyzed 8/1.  Currently no indication for acute dialysis Plan Notify nephrology Serial chemistries Strict I&O  Lactic acidosis, in setting of sepsis Mild Plan Repeat lactic acid following completion of 500 cc fluid challenge  History of dysphasia Plan N.p.o. for now Will need reevaluation per speech therapy when mental status allows   Anemia of chronic disease No evidence of bleeding Plan Subcu heparin Follow up CBC in a.m.   Diabetes with hyperglycemia Plan Sensitive sliding scale protocol   DVT prophylaxis: [place] heparin  SUP: NA  Diet: NPO Activity: BR Disposition : ICU Code status: Limited code.  She would be a candidate for short-term mechanical ventilation with oral airway, central access, vasoactive drips.  Should she suffer a cardiac arrest she would be DO NOT RESUSCITATE  10/15/2017, 1:08 PM

## 2017-10-15 NOTE — ED Provider Notes (Signed)
[name] EMERGENCY DEPARTMENT Provider Note   CSN: [id number] Arrival date & time: 10/15/17  0911     History   Chief Complaint Chief Complaint  Patient presents with  . Altered Mental Status    HPI Nicole Cox is a 84 y.o. female history of CHF, COPD, CAD, diabetes, hypertension, ESRD (Tuesday, Thursday, Saturday dialysis patient) who presents from nursing home for evaluation of altered mental status.  Per EMS, nursing home staff noted that patient was altered on morning rounds.  Unknown last normal.  Family at bedside states that patient was recently admitted on 7/13 for evaluation of decreased mental status, pneumonia.  Patient was discharged back to nursing home on 10/01/2017.  The recommendation was to continue receiving IV antibiotics for her pneumonia treatment.  Family states that patient refused the IV so she has not been getting of the medications.  They states that baseline, patient is normally alert and can converse easily.  They do note that patient was recently transferred to thick liquid diet due to dysphasia.  EM LEVEL 5 CAVEAT: DUE TO PATIENT"S AMS  The history is provided by a relative and the EMS personnel.    Past Medical History:  Diagnosis Date  . CHF (congestive heart failure) (HCC)   . COPD (chronic obstructive pulmonary disease) (HCC)   . Coronary artery disease   . Diabetes mellitus without complication (HCC)   . Heart disease   . Hypertension   . Renal disorder   . Stroke [facility])     Patient Active Problem List   Diagnosis Date Noted  . Sepsis (HCC) 10/15/2017  . Delirium 09/25/2017  . Bacteremia   . SIRS (systemic inflammatory response syndrome) (HCC) 07/27/2017  . Malnutrition of moderate degree 06/10/2017  . Pressure injury of skin 06/08/2017  . Acute pulmonary edema (HCC) 06/07/2017  . Respiratory failure with hypoxia (HCC) 12/12/2015  . Acute on chronic respiratory failure with hypoxia (HCC) 12/12/2015  . Fluid overload  12/12/2015  . Palliative care encounter   . Acute respiratory failure with hypoxia (HCC) 08/13/2015  . ESRD on hemodialysis (HCC) 08/13/2015  . Hypertensive urgency 08/13/2015  . Chronic anemia 08/13/2015  . Acute respiratory failure (HCC) 08/13/2015  . HCAP (healthcare-associated pneumonia) 08/11/2012  . Hypoxia 08/11/2012  . Hypertension 08/11/2012  . DM (diabetes mellitus), type 2 with renal complications (HCC) 08/11/2012  . C1 cervical fracture (HCC) 08/11/2012  . Abnormal MRI, thoracic spine 08/11/2012  . COPD (chronic obstructive pulmonary disease) (HCC) 08/11/2012  . CAD (coronary artery disease) 08/11/2012  . Fall 08/11/2012    Past Surgical History:  Procedure Laterality Date  . ABDOMINAL HYSTERECTOMY    . CHOLECYSTECTOMY    . TEE WITHOUT CARDIOVERSION N/A 08/02/2017   Procedure: TRANSESOPHAGEAL ECHOCARDIOGRAM (TEE);  Surgeon: Lars Masson, MD;  Location: [facility] ENDOSCOPY;  Service: Cardiovascular;  Laterality: N/A;  . TONSILLECTOMY       OB History   None      Home Medications    Prior to Admission medications   Medication Sig Start Date End Date Taking? Authorizing Provider  acetaminophen (TYLENOL) 325 MG tablet Take 650 mg by mouth every 6 (six) hours as needed.   Yes [provider]  albuterol (PROVENTIL HFA;VENTOLIN HFA) 108 (90 Base) MCG/ACT inhaler Inhale 2 puffs into the lungs every 6 (six) hours as needed for wheezing or shortness of breath. 08/17/15  Yes Mikhail, Nita Sells, DO  aspirin EC 81 MG tablet Take 81 mg by mouth daily.   Yes  [provider]  cloNIDine (CATAPRES) 0.1 MG tablet Take 0.1 mg by mouth every 12 (twelve) hours.  10/10/17  Yes [provider]  diphenhydrAMINE (BENADRYL) 25 mg capsule Take 25 mg by mouth every morning.   Yes [provider]  doxercalciferol (HECTOROL) 2.5 MCG capsule Take 1 capsule (2.5 mcg total) by mouth Every Tuesday,Thursday,and Saturday with dialysis. 08/03/17  Yes Osvaldo ShipperKrishnan, Gokul, MD    fluticasone furoate-vilanterol (BREO ELLIPTA) 100-25 MCG/INH AEPB Inhale 1 puff into the lungs daily. 11/10/15  Yes [provider]  gabapentin (NEURONTIN) 100 MG capsule Take 100 mg by mouth 3 (three) times daily. 10/10/17  Yes [provider]  insulin detemir (LEVEMIR) 100 UNIT/ML injection Inject 5 Units into the skin daily.   Yes [provider]  isosorbide mononitrate (IMDUR) 30 MG 24 hr tablet Take 30 mg by mouth every evening. 03/21/17  Yes [provider]  lidocaine-prilocaine (EMLA) cream Apply 1 application topically every other day. 10/21/15  Yes [provider]  lisinopril (PRINIVIL,ZESTRIL) 40 MG tablet Take 40 mg by mouth every evening. 12/02/15  Yes [provider]  Melatonin 3 MG TABS Take 3 mg by mouth at bedtime.   Yes [provider]  metoprolol tartrate (LOPRESSOR) 25 MG tablet Take 0.5 tablets (12.5 mg total) by mouth 2 (two) times daily. Patient taking differently: Take 12.5 mg by mouth daily.  06/10/17  Yes Regalado, Belkys A, MD  NIFEdipine (PROCARDIA XL/ADALAT-CC) 60 MG 24 hr tablet Take 60 mg by mouth 2 (two) times daily. 12/03/15  Yes [provider]  nitroGLYCERIN (NITROSTAT) 0.4 MG SL tablet Place 0.4 mg under the tongue See admin instructions. Mat repeat every 5 minutes. If third tab needed call 911 06/23/17  Yes [provider]  pravastatin (PRAVACHOL) 40 MG tablet Take 40 mg by mouth daily.   Yes [provider]  umeclidinium bromide (INCRUSE ELLIPTA) 62.5 MCG/INH AEPB Inhale 1 puff into the lungs daily.   Yes [provider]  cloNIDine (CATAPRES - DOSED IN MG/24 HR) 0.2 mg/24hr patch Place 1 patch (0.2 mg total) onto the skin every 7 (seven) days. Patient not taking: Reported on 10/15/2017 08/04/17   Osvaldo ShipperKrishnan, Gokul, MD  Nutritional Supplements (FEEDING SUPPLEMENT, NEPRO CARB STEADY,) LIQD Take 237 mLs by mouth 2 (two) times daily between meals. Patient not taking: Reported on  10/15/2017 06/09/17   Alba Coryegalado, Belkys A, MD    Family History Family History  Problem Relation Age of Onset  . Heart disease Mother   . Hypertension Mother   . Heart disease Father   . Hypertension Father   . Heart disease Sister   . Diabetes Sister   . Stroke Sister   . Kidney disease Sister   . Hypertension Sister   . Heart disease Sister   . Kidney disease Sister   . Hypertension Sister   . Kidney disease Daughter   . Hypertension Daughter     Social History Social History   Tobacco Use  . Smoking status: Former Games developermoker  . Smokeless tobacco: Never Used  Substance Use Topics  . Alcohol use: No  . Drug use: No     Allergies   Patient has no known allergies.   Review of Systems Review of Systems  Unable to perform ROS: Mental status change     Physical Exam Updated Vital Signs BP (!) 133/52   Pulse 88   Temp 98.3 F (36.8 C) (Temporal)   Resp 16   Ht 5\' 2"  (1.575  m)   SpO2 100%   BMI 21.77 kg/m   Physical Exam  Constitutional: She is easily aroused.  Frail and elderly appearing.  Easily aroused by verbal stimuli.  HENT:  Head: Normocephalic and atraumatic.  Mouth/Throat: Oropharynx is clear and moist. Mucous membranes are dry.  Eyes: Pupils are equal, round, and reactive to light. Conjunctivae, EOM and lids are normal.  PERRL  Neck: Full passive range of motion without pain.  Cardiovascular: Normal rate, regular rhythm, normal heart sounds and normal pulses. Exam reveals no gallop and no friction rub.  No murmur heard. Pulmonary/Chest: Effort normal. She has rales.  Abdominal: Soft. Normal appearance. There is no tenderness. There is no rigidity and no guarding.  Abdomen is soft, non-distended  Musculoskeletal: Normal range of motion.  Neurological: She is easily aroused.  Moving all extremities spontaneously.  Does not follow commands. Easily arousable.   Skin: Skin is warm and dry. Capillary refill takes less than 2 seconds.  Psychiatric: She  has a normal mood and affect. Her speech is normal.  Nursing note and vitals reviewed.    ED Treatments / Results  Labs (all labs ordered are listed, but only abnormal results are displayed) Labs Reviewed  COMPREHENSIVE METABOLIC PANEL - Abnormal; Notable for the following components:      Result Value   Glucose, Bld 143 (*)    Creatinine, Ser 4.51 (*)    Total Protein 8.4 (*)    Albumin 2.7 (*)    GFR calc non Af Amer 8 (*)    GFR calc Af Amer 10 (*)    Anion gap 16 (*)    All other components within normal limits  CBC WITH DIFFERENTIAL/PLATELET - Abnormal; Notable for the following components:   WBC 16.1 (*)    RBC 3.38 (*)    Hemoglobin 8.9 (*)    HCT 31.3 (*)    MCHC 28.4 (*)    RDW 22.7 (*)    Neutro Abs 13.0 (*)    Monocytes Absolute 1.3 (*)    All other components within normal limits  I-STAT TROPONIN, ED - Abnormal; Notable for the following components:   Troponin i, poc 0.55 (*)    All other components within normal limits  I-STAT CG4 LACTIC ACID, ED - Abnormal; Notable for the following components:   Lactic Acid, Venous 3.78 (*)    All other components within normal limits  CBG MONITORING, ED - Abnormal; Notable for the following components:   Glucose-Capillary 148 (*)    All other components within normal limits  POCT I-STAT 3, ART BLOOD GAS (G3+) - Abnormal; Notable for the following components:   pO2, Arterial 110.0 (*)    All other components within normal limits  CULTURE, BLOOD (ROUTINE X 2)  CULTURE, BLOOD (ROUTINE X 2)  CULTURE, BLOOD (ROUTINE X 2)  CULTURE, BLOOD (ROUTINE X 2)  MRSA PCR SCREENING  LIPASE, BLOOD  PROCALCITONIN  LACTIC ACID, PLASMA  LACTIC ACID, PLASMA  STREP PNEUMONIAE URINARY ANTIGEN  BLOOD GAS, ARTERIAL  CBC WITH DIFFERENTIAL/PLATELET  COMPREHENSIVE METABOLIC PANEL  LACTIC ACID, PLASMA  LACTIC ACID, PLASMA  PROTIME-INR  APTT  I-STAT CG4 LACTIC ACID, ED    EKG EKG Interpretation  Date/Time:  Friday October 15 2017  09:12:51 EDT Ventricular Rate:  104 PR Interval:  130 QRS Duration: 74 QT Interval:  340 QTC Calculation: 447 R Axis:   11 Text Interpretation:  Sinus tachycardia with Premature atrial complexes Nonspecific T wave abnormality Abnormal ECG Confirmed by Madilyn Hook,  Lanora Manis [id number]) on 10/15/2017 9:25:57 AM   Radiology Ct Head Wo Contrast  Result Date: 10/15/2017 CLINICAL DATA:  Altered level of consciousness. EXAM: CT HEAD WITHOUT CONTRAST TECHNIQUE: Contiguous axial images were obtained from the base of the skull through the vertex without intravenous contrast. COMPARISON:  CT head 07/27/2017 FINDINGS: Brain: Mild atrophy. Moderate chronic microvascular ischemic change in the white matter. Negative for acute infarct, hemorrhage, mass. No change from the prior study. Empty sella incidentally noted. Vascular: Negative for hyperdense vessel Skull: Negative Sinuses/Orbits: Interval development of right mastoid effusion and middle ear effusion. Left mastoid sinus clear. Paranasal sinuses clear. Bilateral cataract surgery. No orbital mass. Other: None IMPRESSION: Atrophy and chronic microvascular ischemia. No acute intracranial abnormality Interval development of right mastoid and middle ear effusion. Electronically Signed   By: Marlan Palau M.D.   On: 10/15/2017 11:13   Dg Chest Portable 1 View  Result Date: 10/15/2017 CLINICAL DATA:  Shortness of breath. EXAM: PORTABLE CHEST 1 VIEW COMPARISON:  10/01/2017 FINDINGS: The heart is enlarged. There is tortuosity and calcification of the thoracic aorta. Extensive interstitial changes appear relatively stable and could reflect interstitial disease or interstitial edema. No focal infiltrates. Dense calcifications at the right lung base likely pleural calcifications related to prior hemothorax or empyema. No definite pleural effusions. Chest CT may be helpful for further evaluation. IMPRESSION: Cardiac enlargement and probable interstitial edema superimposed on chronic  lung disease. CT may be helpful for further evaluation. Electronically Signed   By: Rudie Meyer M.D.   On: 10/15/2017 09:42    Procedures .Critical Care Performed by: Maxwell Caul, PA-C Authorized by: Maxwell Caul, PA-C   Critical care provider statement:    Critical care time (minutes):  45   Critical care was time spent personally by me on the following activities:  Discussions with consultants, evaluation of patient's response to treatment, examination of patient, ordering and performing treatments and interventions, ordering and review of laboratory studies, ordering and review of radiographic studies, pulse oximetry, re-evaluation of patient's condition, obtaining history from patient or surrogate and review of old charts   (including critical care time)  Medications Ordered in ED Medications  meropenem (MERREM) 500 mg in sodium chloride 0.9 % 100 mL IVPB (has no administration in time range)  0.9 %  sodium chloride infusion (has no administration in time range)  insulin aspart (novoLOG) injection 0-9 Units ( Subcutaneous Canceled Entry 10/15/17 1506)  heparin injection 5,000 Units (5,000 Units Subcutaneous Given 10/15/17 1520)  sodium chloride flush (NS) 0.9 % injection 3 mL ( Intravenous Canceled Entry 10/15/17 1506)  sodium chloride flush (NS) 0.9 % injection 3 mL (has no administration in time range)  0.9 %  sodium chloride infusion (has no administration in time range)  vancomycin (VANCOCIN) IVPB 1000 mg/200 mL premix (has no administration in time range)  vancomycin (VANCOCIN) 500 mg in sodium chloride 0.9 % 100 mL IVPB (has no administration in time range)  arformoterol (BROVANA) nebulizer solution 15 mcg (has no administration in time range)  budesonide (PULMICORT) nebulizer solution 0.5 mg (has no administration in time range)  0.9 %  sodium chloride infusion ( Intravenous Stopped 10/15/17 1152)  meropenem (MERREM) 1 g in sodium chloride 0.9 % 100 mL IVPB (0 g  Intravenous Stopped 10/15/17 1152)  sodium chloride 0.9 % bolus 500 mL (0 mLs Intravenous Stopped 10/15/17 1316)     Initial Impression / Assessment and Plan / ED Course  I have reviewed the triage vital signs and  the nursing notes.  Pertinent labs & imaging results that were available during my care of the patient were reviewed by me and considered in my medical decision making (see chart for details).     84 y.o. F CHF, COPD, CAD, diabetes, hypertension, ESRD (Tuesday, Thursday, Saturday dialysis patient) who presents from nursing home for evaluation of altered mental status.  Patient is afebrile.  She is on 2 L O2 which she has been on at the nursing home.  O2 sats are stable.  Blood pressure stable.  Patient will arouse to verbal stimuli.  Does not follow commands.  Moves all extremities spontaneously.  On exam, she does have rales noted on her lung exam.  Her acute infectious etiology, CHF exacerbation versus ACS etiology.   Review of records show that patient had pneumonia with a sign of bacterial bacteremia causing acute hypoxic respiratory failure and toxic encephalopathy.  Blood cultures showed actinobacter and patient was started on meropenem.  Plan was to be discharged on 10/01/2016 with plans to do IV Unasyn.  The nursing home did not have that antibiotic and she was switched to imipenem.  Plan was to continue IV antibiotics at SNF with plans for stop date of 10/12/2017.  Family reports that patient refused the IV medication so she has not gotten any IV since being home.  Lactic acid 3.78.  Troponin 0.55.  She has had previous elevations in her trip, including 2 months ago when it was 0.07.  CBC shows leukocytosis of 16.1.  Hemoglobin is 8.9, hematocrit is 31.3.  This is consistent with patient's previous blood work.   X-ray shows interstitial edema.  Given concerns of fluid overload, will give small amount of fluid to combat dehydration.  At this time, her lactate is less than 4 and she is  not hypotensive.  Discussed with hospitalist.  Will plan to admit.  RN informed me that patient was getting her CT scan, she became more unresponsive.  On my evaluation, she will not open her eyes to verbal stimuli.  She is protecting her airway.  Her blood pressure has now gone 91/40.  Given concerns of pulmonary edema, do not want to fluid overload her and cause flash pulmonary edema.  Given findings on exam and lower blood pressure, will consult critical care medicine  Discussed patient with Dr. Roselyn Bering (Critical Care). He will evaluate the patient.   Critical care evaluated patient and will admit.  Final Clinical Impressions(s) / ED Diagnoses   Final diagnoses:  Altered mental status, unspecified altered mental status type    ED Discharge Orders    None       Rosana Hoes 10/15/17 1534    Tilden Fossa, MD 10/16/17 [phone]

## 2017-10-15 NOTE — ED Notes (Signed)
Report called to patrick, RN

## 2017-10-15 NOTE — Consult Note (Signed)
Reason for Consult: To manage dialysis and dialysis related needs Referring Physician: Icard  Nicole Cox is an 84 y.o. female NHP with HTN, prior CVA, CAD, COPD, reported heart failure with pulmonary HTN but with pEF.  She is s/p hosp 7/13 to 7/21 at [facility] for sepsis/pneumonia and acinetobacter bacteremia.  She was discharged to SNF was supposed to get imepenem but reportedly pt refused and was changed to augmentin per the Dialysis center.  Family reports worsening MS so was brought here for eval.  She did get her full HD yesterday at [facility]_ they reported no volume removal because pt was so nauseated even though came in 3 pounds up.  Today in ER found elevated WBC, toponin and lactate so will be admitted for sepsis protocol per CCM.  We are asked to provide routine dialysis for the patient.  CXR showing interstitial disease vs edema   Dialyzes at [facility]- HP- [place] - TTS- 3 hours-  EDW 121. Some variable weights of late- got full tx 8/1 but no volume removal sec to nausea HD Bath 2K/2.5 calc, Dialyzer 180, Heparin yes- 2000 bolus and 250 per hour. Access AVG- 400 BFR. Gets mircera 150 q 2 weeks- last given 7/25- last hgb at center 8.7 hect 1 mcg IV q tx  Past Medical History:  Diagnosis Date  . CHF (congestive heart failure) ([place])   . COPD (chronic obstructive pulmonary disease) ([place])   . Coronary artery disease   . Diabetes mellitus without complication ([place])   . Heart disease   . Hypertension   . Renal disorder   . Stroke [facility])     Past Surgical History:  Procedure Laterality Date  . ABDOMINAL HYSTERECTOMY    . CHOLECYSTECTOMY    . TEE WITHOUT CARDIOVERSION N/A 08/02/2017   Procedure: TRANSESOPHAGEAL ECHOCARDIOGRAM (TEE);  Surgeon: Dorothy Spark, MD;  Location: [facility] ENDOSCOPY;  Service: Cardiovascular;  Laterality: N/A;  . TONSILLECTOMY      Family History  Problem Relation Age of Onset  . Heart disease Mother   . Hypertension Mother   . Heart disease Father   .  Hypertension Father   . Heart disease Sister   . Diabetes Sister   . Stroke Sister   . Kidney disease Sister   . Hypertension Sister   . Heart disease Sister   . Kidney disease Sister   . Hypertension Sister   . Kidney disease Daughter   . Hypertension Daughter     Social History:  reports that she has quit smoking. She has never used smokeless tobacco. She reports that she does not drink alcohol or use drugs.  Allergies: No Known Allergies  Medications: I have reviewed the patient's current medications. Hectorol 1 q tx   Results for orders placed or performed during the hospital encounter of 10/15/17 (from the past 48 hour(s))  Comprehensive metabolic panel     Status: Abnormal   Collection Time: 10/15/17  9:35 AM  Result Value Ref Range   Sodium 140 135 - 145 mmol/L   Potassium 4.9 3.5 - 5.1 mmol/L   Chloride 98 98 - 111 mmol/L   CO2 26 22 - 32 mmol/L   Glucose, Bld 143 (H) 70 - 99 mg/dL   BUN 23 8 - 23 mg/dL   Creatinine, Ser 4.51 (H) 0.44 - 1.00 mg/dL   Calcium 9.1 8.9 - 10.3 mg/dL   Total Protein 8.4 (H) 6.5 - 8.1 g/dL   Albumin 2.7 (L) 3.5 - 5.0 g/dL   AST  35 15 - 41 U/L   ALT 18 0 - 44 U/L   Alkaline Phosphatase 94 38 - 126 U/L   Total Bilirubin 0.6 0.3 - 1.2 mg/dL   GFR calc non Af Amer 8 (L) >60 mL/min   GFR calc Af Amer 10 (L) >60 mL/min    Comment: (NOTE) The eGFR has been calculated using the CKD EPI equation. This calculation has not been validated in all clinical situations. eGFR's persistently <60 mL/min signify possible Chronic Kidney Disease.    Anion gap 16 (H) 5 - 15    Comment: Performed at [facility], [place] [address], [place], [place] [address]  CBC with Differential     Status: Abnormal   Collection Time: 10/15/17  9:35 AM  Result Value Ref Range   WBC 16.1 (H) 4.0 - 10.5 K/uL   RBC 3.38 (L) 3.87 - 5.11 MIL/uL   Hemoglobin 8.9 (L) 12.0 - 15.0 g/dL   HCT 31.3 (L) 36.0 - 46.0 %   MCV 92.6 78.0 - 100.0 fL   MCH 26.3 26.0 - 34.0 pg    MCHC 28.4 (L) 30.0 - 36.0 g/dL   RDW 22.7 (H) 11.5 - 15.5 %   Platelets 300 150 - 400 K/uL   Neutrophils Relative % 81 %   Lymphocytes Relative 11 %   Monocytes Relative 8 %   Eosinophils Relative 0 %   Basophils Relative 0 %   Neutro Abs 13.0 (H) 1.7 - 7.7 K/uL   Lymphs Abs 1.8 0.7 - 4.0 K/uL   Monocytes Absolute 1.3 (H) 0.1 - 1.0 K/uL   Eosinophils Absolute 0.0 0.0 - 0.7 K/uL   Basophils Absolute 0.0 0.0 - 0.1 K/uL    Comment: Performed at [facility], [place] [address] [place], [place] [address]  Lipase, blood     Status: None   Collection Time: 10/15/17  9:35 AM  Result Value Ref Range   Lipase 45 11 - 51 U/L    Comment: Performed at [facility] [address], [place], [place] [address]  I-stat troponin, ED     Status: Abnormal   Collection Time: 10/15/17  9:47 AM  Result Value Ref Range   Troponin i, poc 0.55 (HH) 0.00 - 0.08 ng/mL   Comment NOTIFIED PHYSICIAN    Comment 3            Comment: Due to the release kinetics of cTnI, a negative result within the first hours of the onset of symptoms does not rule out myocardial infarction with certainty. If myocardial infarction is still suspected, repeat the test at appropriate intervals.   I-Stat CG4 Lactic Acid, ED     Status: Abnormal   Collection Time: 10/15/17  9:49 AM  Result Value Ref Range   Lactic Acid, Venous 3.78 (HH) 0.5 - 1.9 mmol/L   Comment NOTIFIED PHYSICIAN   CBG monitoring, ED     Status: Abnormal   Collection Time: 10/15/17 11:33 AM  Result Value Ref Range   Glucose-Capillary 148 (H) 70 - 99 mg/dL    Ct Head Wo Contrast  Result Date: 10/15/2017 CLINICAL DATA:  Altered level of consciousness. EXAM: CT HEAD WITHOUT CONTRAST TECHNIQUE: Contiguous axial images were obtained from the base of the skull through the vertex without intravenous contrast. COMPARISON:  CT head 07/27/2017 FINDINGS: Brain: Mild atrophy. Moderate chronic microvascular ischemic change in the white matter. Negative for  acute infarct, hemorrhage, mass. No change from the prior study. Empty sella  incidentally noted. Vascular: Negative for hyperdense vessel Skull: Negative Sinuses/Orbits: Interval development of right mastoid effusion and middle ear effusion. Left mastoid sinus clear. Paranasal sinuses clear. Bilateral cataract surgery. No orbital mass. Other: None IMPRESSION: Atrophy and chronic microvascular ischemia. No acute intracranial abnormality Interval development of right mastoid and middle ear effusion. Electronically Signed   By: Franchot Gallo M.D.   On: 10/15/2017 11:13   Dg Chest Portable 1 View  Result Date: 10/15/2017 CLINICAL DATA:  Shortness of breath. EXAM: PORTABLE CHEST 1 VIEW COMPARISON:  10/01/2017 FINDINGS: The heart is enlarged. There is tortuosity and calcification of the thoracic aorta. Extensive interstitial changes appear relatively stable and could reflect interstitial disease or interstitial edema. No focal infiltrates. Dense calcifications at the right lung base likely pleural calcifications related to prior hemothorax or empyema. No definite pleural effusions. Chest CT may be helpful for further evaluation. IMPRESSION: Cardiac enlargement and probable interstitial edema superimposed on chronic lung disease. CT may be helpful for further evaluation. Electronically Signed   By: Marijo Sanes M.D.   On: 10/15/2017 09:42    ROS: pt really not that responsive to get ROS Blood pressure (!) 114/48, pulse 86, temperature 98.3 F (36.8 C), temperature source Temporal, resp. rate 17, height 5' 2"  (1.575 m), SpO2 100 %. General appearance: unresponsive to name and most stimuli Resp: diminished breath sounds bilaterally Cardio: regular rate and rhythm, S1, S2 normal, no murmur, click, rub or gallop GI: soft, non-tender; bowel sounds normal; no masses,  no organomegaly Extremities: edema none- many abrasions but no significant wounds Neurologic: Mental status: Alert, oriented, thought content  appropriate, the previous statement not true left upper arm AVF- bandaged, good thrill and bruit  Assessment/Plan: 84 year old chronically ill female with recent sepsis- now returning 2 weeks later with dec MS, elevated WBC and lactate concerning for sepsis 1  Sepsis- per CCM- possibly incomplete tx of prior bacteremia or new issue.  Empiric meropenem and vanc 2 ESRD: normally TTS as OP via AVG.  No absolute indications for HD at present - will plan for treatment tomorrow with volume removal as able given CXR findings and no volume removal with HD on 8/1 3 Hypertension: normally very hypertensive on multiple meds so BP right now a little unusual for her and could be leading to some somnolence.  Hold BP meds and follow 4. Anemia of ESRD: significant but unchanged from OP setting- will give ESA but hold any iron in the setting of sepsis 5. Metabolic Bone Disease: continue hectorol with HD- no other meds for now 6.  Decreased MS- possibly due to sepsis vs reported new neurontin as med  Nicole Cox A 10/15/2017, 2:42 PM

## 2017-10-16 ENCOUNTER — Encounter (HOSPITAL_COMMUNITY): Payer: Self-pay

## 2017-10-16 ENCOUNTER — Inpatient Hospital Stay (HOSPITAL_COMMUNITY): Payer: Medicare HMO

## 2017-10-16 DIAGNOSIS — I340 Nonrheumatic mitral (valve) insufficiency: Secondary | ICD-10-CM

## 2017-10-16 DIAGNOSIS — A021 Salmonella sepsis: Secondary | ICD-10-CM

## 2017-10-16 DIAGNOSIS — I480 Paroxysmal atrial fibrillation: Secondary | ICD-10-CM

## 2017-10-16 LAB — POCT I-STAT 3, ART BLOOD GAS (G3+)
BICARBONATE: 25.6 mmol/L (ref 20.0–28.0)
O2 SAT: 95 %
PO2 ART: 78 mmHg — AB (ref 83.0–108.0)
TCO2: 27 mmol/L (ref 22–32)
pCO2 arterial: 43.1 mmHg (ref 32.0–48.0)
pH, Arterial: 7.379 (ref 7.350–7.450)

## 2017-10-16 LAB — GLUCOSE, CAPILLARY
GLUCOSE-CAPILLARY: 99 mg/dL (ref 70–99)
GLUCOSE-CAPILLARY: 75 mg/dL (ref 70–99)
GLUCOSE-CAPILLARY: 119 mg/dL — AB (ref 70–99)
Glucose-Capillary: 97 mg/dL (ref 70–99)
Glucose-Capillary: 68 mg/dL — ABNORMAL LOW (ref 70–99)

## 2017-10-16 LAB — CBC
HCT: 24 % — ABNORMAL LOW (ref 36.0–46.0)
HCT: 24.2 % — ABNORMAL LOW (ref 36.0–46.0)
HEMOGLOBIN: 7 g/dL — AB (ref 12.0–15.0)
Hemoglobin: 7 g/dL — ABNORMAL LOW (ref 12.0–15.0)
MCH: 27.1 pg (ref 26.0–34.0)
MCH: 26.8 pg (ref 26.0–34.0)
MCHC: 29.2 g/dL — ABNORMAL LOW (ref 30.0–36.0)
MCHC: 28.9 g/dL — ABNORMAL LOW (ref 30.0–36.0)
MCV: 93 fL (ref 78.0–100.0)
MCV: 92.7 fL (ref 78.0–100.0)
Platelets: 215 10*3/uL (ref 150–400)
Platelets: 216 10*3/uL (ref 150–400)
RBC: 2.58 MIL/uL — ABNORMAL LOW (ref 3.87–5.11)
RBC: 2.61 MIL/uL — ABNORMAL LOW (ref 3.87–5.11)
RDW: 21.6 % — ABNORMAL HIGH (ref 11.5–15.5)
RDW: 21.6 % — ABNORMAL HIGH (ref 11.5–15.5)
WBC: 12.4 10*3/uL — ABNORMAL HIGH (ref 4.0–10.5)
WBC: 11.3 10*3/uL — AB (ref 4.0–10.5)

## 2017-10-16 LAB — ECHOCARDIOGRAM COMPLETE
Height: 62 in
WEIGHTICAEL: 2081.14 [oz_av]

## 2017-10-16 LAB — PREPARE RBC (CROSSMATCH)

## 2017-10-16 LAB — MAGNESIUM: Magnesium: 2 mg/dL (ref 1.7–2.4)

## 2017-10-16 LAB — RENAL FUNCTION PANEL
Albumin: 2.5 g/dL — ABNORMAL LOW (ref 3.5–5.0)
Anion gap: 11 (ref 5–15)
BUN: 26 mg/dL — ABNORMAL HIGH (ref 8–23)
CO2: 26 mmol/L (ref 22–32)
Calcium: 8.4 mg/dL — ABNORMAL LOW (ref 8.9–10.3)
Chloride: 102 mmol/L (ref 98–111)
Creatinine, Ser: 3.83 mg/dL — ABNORMAL HIGH (ref 0.44–1.00)
GFR calc Af Amer: 12 mL/min — ABNORMAL LOW (ref >60)
GFR, EST NON AFRICAN AMERICAN: 10 mL/min — AB (ref >60)
GLUCOSE: 101 mg/dL — AB (ref 70–99)
PHOSPHORUS: 4.2 mg/dL (ref 2.5–4.6)
Potassium: 4.2 mmol/L (ref 3.5–5.1)
SODIUM: 139 mmol/L (ref 135–145)

## 2017-10-16 LAB — BASIC METABOLIC PANEL
Anion gap: 13 (ref 5–15)
BUN: 37 mg/dL — ABNORMAL HIGH (ref 8–23)
CO2: 24 mmol/L (ref 22–32)
CREATININE: 5.41 mg/dL — AB (ref 0.44–1.00)
Calcium: 8.8 mg/dL — ABNORMAL LOW (ref 8.9–10.3)
Chloride: 104 mmol/L (ref 98–111)
GFR calc Af Amer: 8 mL/min — ABNORMAL LOW (ref >60)
GFR calc non Af Amer: 7 mL/min — ABNORMAL LOW (ref >60)
Glucose, Bld: 99 mg/dL (ref 70–99)
Potassium: 4.7 mmol/L (ref 3.5–5.1)
SODIUM: 141 mmol/L (ref 135–145)

## 2017-10-16 LAB — PHOSPHORUS: Phosphorus: 6 mg/dL — ABNORMAL HIGH (ref 2.5–4.6)

## 2017-10-16 LAB — ABO/RH: ABO/RH(D): O POS

## 2017-10-16 LAB — TROPONIN I
TROPONIN I: 1.33 ng/mL — AB (ref <0.03)
Troponin I: 1.65 ng/mL (ref <0.03)

## 2017-10-16 LAB — PROCALCITONIN: Procalcitonin: 0.89 ng/mL

## 2017-10-16 MED ORDER — PENTAFLUOROPROP-TETRAFLUOROETH EX AERO: 1.0000 "application " | INHALATION_SPRAY | CUTANEOUS | Status: DC | PRN

## 2017-10-16 MED ORDER — DIPHENHYDRAMINE HCL 25 MG PO CAPS
25.0000 mg | ORAL_CAPSULE | Freq: Once | ORAL | Status: DC
  Filled 2017-10-16: qty 1

## 2017-10-16 MED ORDER — ACETAMINOPHEN 325 MG PO TABS
650.0000 mg | ORAL_TABLET | Freq: Four times a day (QID) | ORAL | Status: DC | PRN
  Administered 2017-10-16 – 2017-10-26 (×13): 650 mg via ORAL
  Filled 2017-10-16 (×12): qty 2

## 2017-10-16 MED ORDER — AMIODARONE HCL IN DEXTROSE 360-4.14 MG/200ML-% IV SOLN
60.0000 mg/h | INTRAVENOUS | Status: AC
  Administered 2017-10-16 (×2): 60 mg/h via INTRAVENOUS
  Filled 2017-10-16 (×2): qty 200

## 2017-10-16 MED ORDER — FENTANYL CITRATE (PF) 100 MCG/2ML IJ SOLN
12.5000 ug | INTRAMUSCULAR | Status: DC | PRN
  Administered 2017-10-16 – 2017-10-20 (×20): 25 ug via INTRAVENOUS
  Filled 2017-10-16 (×18): qty 2

## 2017-10-16 MED ORDER — ASPIRIN 300 MG RE SUPP
300.0000 mg | Freq: Every day | RECTAL | Status: DC
  Administered 2017-10-16: 300 mg via RECTAL
  Filled 2017-10-16: qty 1

## 2017-10-16 MED ORDER — HEPARIN SODIUM (PORCINE) 1000 UNIT/ML DIALYSIS: 1000.0000 [IU] | INTRAMUSCULAR | Status: DC | PRN

## 2017-10-16 MED ORDER — SODIUM CHLORIDE 0.9 % IV SOLN: 100.0000 mL | INTRAVENOUS | Status: DC | PRN

## 2017-10-16 MED ORDER — AMIODARONE LOAD VIA INFUSION
150.0000 mg | Freq: Once | INTRAVENOUS | Status: AC
  Administered 2017-10-16: 150 mg via INTRAVENOUS
  Filled 2017-10-16: qty 83.34

## 2017-10-16 MED ORDER — LIDOCAINE HCL (PF) 1 % IJ SOLN: 5.0000 mL | INTRAMUSCULAR | Status: DC | PRN

## 2017-10-16 MED ORDER — DARBEPOETIN ALFA 200 MCG/0.4ML IJ SOSY
PREFILLED_SYRINGE | INTRAMUSCULAR | Status: AC
  Filled 2017-10-16: qty 0.4

## 2017-10-16 MED ORDER — LIDOCAINE-PRILOCAINE 2.5-2.5 % EX CREA: 1.0000 "application " | TOPICAL_CREAM | CUTANEOUS | Status: DC | PRN

## 2017-10-16 MED ORDER — SODIUM CHLORIDE 0.9% IV SOLUTION
Freq: Once | INTRAVENOUS | Status: AC
  Administered 2017-10-16: 13:00:00 via INTRAVENOUS

## 2017-10-16 MED ORDER — ALTEPLASE 2 MG IJ SOLR: 2.0000 mg | Freq: Once | INTRAMUSCULAR | Status: DC | PRN

## 2017-10-16 MED ORDER — AMIODARONE HCL IN DEXTROSE 360-4.14 MG/200ML-% IV SOLN
30.0000 mg/h | INTRAVENOUS | Status: DC
  Administered 2017-10-16: 30 mg/h via INTRAVENOUS

## 2017-10-16 MED ORDER — HEPARIN SODIUM (PORCINE) 1000 UNIT/ML DIALYSIS: 20.0000 [IU]/kg | INTRAMUSCULAR | Status: DC | PRN

## 2017-10-16 MED ORDER — ACETAMINOPHEN 325 MG PO TABS
650.0000 mg | ORAL_TABLET | Freq: Once | ORAL | Status: AC
  Administered 2017-10-16: 650 mg via ORAL
  Filled 2017-10-16: qty 2

## 2017-10-16 NOTE — Procedures (Signed)
Patient was seen on dialysis and the procedure was supervised.  BFR 400  Via AVF BP is  111/43.   Patient appears to be tolerating treatment well  Nicole Cox A 10/16/2017

## 2017-10-16 NOTE — Progress Notes (Signed)
HD tx stopped with 1Hr 10 mins left; pt converted from NSR to A-fib; HR>140-160; paged Dr. Kathrene BongoGoldsborough; Bard HerbertMarty Bergman, NP on the unit; she ordered to stop HD tx; pt unstable; ordered 1 unit of PRBC's to be transfused on the floor Hgb=7. Report  changes to Celene Krasillon White, RN on the Floor, accompanied transported to the unit to ensure safety.

## 2017-10-16 NOTE — Progress Notes (Signed)
Subjective:  BP soft and hypothermic overnight- O2 sat fine on baseline  [place] O2 Objective Vital signs in last 24 hours: Vitals:   10/16/17 0400 10/16/17 0500 10/16/17 0600 10/16/17 0700  BP: (!) 111/54 (!) 109/45 (!) 108/49 (!) 125/47  Pulse: 60 65 68 74  Resp: 16 13 17 16   Temp: 97.6 F (36.4 C)     TempSrc: Oral     SpO2: 100% 97% 93% 96%  Weight:      Height:       Weight change:   Intake/Output Summary (Last 24 hours) at 10/16/2017 0750 Last data filed at 10/16/2017 0200 Gross per 24 hour  Intake 1463.02 ml  Output -  Net 1463.02 ml    Dialyzes at [facility]- HP- [place] - TTS- 3 hours-  EDW 121. Some variable weights of late- got full tx 8/1 but no volume removal sec to nausea HD Bath 2K/2.5 calc, Dialyzer 180, Heparin yes- 2000 bolus and 250 per hour. Access AVG- 400 BFR. Gets mircera 150 q 2 weeks- last given 7/25- last hgb at center 8.7 hect 1 mcg IV q tx   Assessment/Plan: 84 year old chronically ill female with recent sepsis- now returning 2 weeks later with dec MS, elevated WBC and lactate concerning for sepsis 1  Sepsis- per CCM- possibly incomplete tx of prior bacteremia or new issue.  Empiric meropenem and vanc 2 ESRD: normally TTS as OP via AVG.  Plan for treatment today via AVG for 4 hours with volume removal as able given CXR findings and no volume removal with HD on 8/1 3 Hypertension: normally very hypertensive on multiple meds so BP right now a little unusual for her and could be leading to some somnolence.  Hold BP meds and follow 4. Anemia of ESRD: significant but unchanged from OP setting- will give ESA but hold any iron in the setting of sepsis 5. Metabolic Bone Disease: continue hectorol with HD- no other meds for now 6.  Decreased MS- possibly due to sepsis vs reported new neurontin as med- maybe HD today will help     Nicole Cox A    Labs: Basic Metabolic Panel: Recent Labs  Lab 10/15/17 0935 10/15/17 1515  NA 140 141  K 4.9 4.7  CL 98  103  CO2 26 23  GLUCOSE 143* 123*  BUN 23 30*  CREATININE 4.51* 4.45*  CALCIUM 9.1 8.9   Liver Function Tests: Recent Labs  Lab 10/15/17 0935 10/15/17 1515  AST 35 26  ALT 18 16  ALKPHOS 94 95  BILITOT 0.6 0.6  PROT 8.4* 7.7  ALBUMIN 2.7* 2.6*   Recent Labs  Lab 10/15/17 0935  LIPASE 45   No results for input(s): AMMONIA in the last 168 hours. CBC: Recent Labs  Lab 10/15/17 0935 10/15/17 1515  WBC 16.1* 15.5*  NEUTROABS 13.0* 11.4*  HGB 8.9* 8.7*  HCT 31.3* 30.7*  MCV 92.6 93.0  PLT 300 256   Cardiac Enzymes: Recent Labs  Lab 10/15/17 1515 10/16/17 0022  TROPONINI 1.30* 1.65*   CBG: Recent Labs  Lab 10/15/17 1133 10/15/17 1536 10/15/17 2011 10/16/17 0007 10/16/17 0358  GLUCAP 148* 129* 110* 97 99    Iron Studies: No results for input(s): IRON, TIBC, TRANSFERRIN, FERRITIN in the last 72 hours. Studies/Results: Ct Head Wo Contrast  Result Date: 10/15/2017 CLINICAL DATA:  Altered level of consciousness. EXAM: CT HEAD WITHOUT CONTRAST TECHNIQUE: Contiguous axial images were obtained from the base of the skull through the vertex without intravenous contrast.  COMPARISON:  CT head 07/27/2017 FINDINGS: Brain: Mild atrophy. Moderate chronic microvascular ischemic change in the white matter. Negative for acute infarct, hemorrhage, mass. No change from the prior study. Empty sella incidentally noted. Vascular: Negative for hyperdense vessel Skull: Negative Sinuses/Orbits: Interval development of right mastoid effusion and middle ear effusion. Left mastoid sinus clear. Paranasal sinuses clear. Bilateral cataract surgery. No orbital mass. Other: None IMPRESSION: Atrophy and chronic microvascular ischemia. No acute intracranial abnormality Interval development of right mastoid and middle ear effusion. Electronically Signed   By: Marlan Palau M.D.   On: 10/15/2017 11:13   Dg Chest Portable 1 View  Result Date: 10/15/2017 CLINICAL DATA:  Shortness of breath. EXAM:  PORTABLE CHEST 1 VIEW COMPARISON:  10/01/2017 FINDINGS: The heart is enlarged. There is tortuosity and calcification of the thoracic aorta. Extensive interstitial changes appear relatively stable and could reflect interstitial disease or interstitial edema. No focal infiltrates. Dense calcifications at the right lung base likely pleural calcifications related to prior hemothorax or empyema. No definite pleural effusions. Chest CT may be helpful for further evaluation. IMPRESSION: Cardiac enlargement and probable interstitial edema superimposed on chronic lung disease. CT may be helpful for further evaluation. Electronically Signed   By: Rudie Meyer M.D.   On: 10/15/2017 09:42   Medications: Infusions: . sodium chloride    . sodium chloride    . meropenem (MERREM) IV    . vancomycin      Scheduled Medications: . arformoterol  15 mcg Nebulization BID  . aspirin  300 mg Rectal Daily  . budesonide (PULMICORT) nebulizer solution  0.5 mg Nebulization BID  . chlorhexidine  15 mL Mouth Rinse BID  . Chlorhexidine Gluconate Cloth  6 each Topical Q0600  . darbepoetin (ARANESP) injection - DIALYSIS  200 mcg Intravenous Q Sat-HD  . heparin  5,000 Units Subcutaneous Q8H  . insulin aspart  0-9 Units Subcutaneous Q4H  . mouth rinse  15 mL Mouth Rinse q12n4p  . sodium chloride flush  3 mL Intravenous Q12H    have reviewed scheduled and prn medications.  Physical Exam: General: maybe a little more alert this AM Heart: RRR Lungs: dec BS at bases Abdomen: soft, non tender Extremities: dependent edema Dialysis Access: left upper arm AVG- patent    10/16/2017,7:50 AM  LOS: 1 day

## 2017-10-16 NOTE — Progress Notes (Signed)
eLink Physician-Brief Progress Note Patient Name: Nicole Cox DOB: 07-Jun-1931 MRN: [id number]   Date of Service  10/16/2017  HPI/Events of Note  NSTEMI likely secondary to demand (type 2) ABG acceptable.  eICU Interventions  Aspirin 300 mg rectally daily, Echo to check LV function r/o RWMA, defer Statin, Beta blocker pending resumption of oral intake        Nicole Cox 10/16/2017, 4:57 AM

## 2017-10-16 NOTE — Progress Notes (Signed)
PULMONARY / CRITICAL CARE MEDICINE   Name: Nicole Cox MRN: [id number] DOB: July 09, 1931    ADMISSION DATE:  10/15/2017 CONSULTATION DATE:  8/2  REFERRING MD:  Madilyn Hook  CHIEF COMPLAINT:  Sepsis and acute encephalopathy   HISTORY OF PRESENT ILLNESS:   84 year old female patient with a past medical history of end-stage renal disease, hypertension, prior stroke, heart failure with preserved ejection fraction, coronary artery disease as well as COPD.  She presents for her third hospitalization since May 2019 to the emergency room on 8/2 2019.  She was apparently just discharged on 7/20 after being hospitalized for pneumonia and Acinetobacter bacteremia.  She was to be discharged on 10-day course of imipenem however she refused IV access.  Is unclear if she received any oral therapy in place of IV. Will state of health on 7/26, however family began to notice decreased level of consciousness, poor p.o. intake, increased weakness as early as 7/27.  They initially felt this might have been related to her post dialysis treatment.  She often will be sleepy and at times disoriented after dialysis, however the symptoms continued over the course of the week.  They spoke to the nursing home physician, who had also noted she had been recently started on "something for pain" record review suggests this might of been Neurontin.  And there was concern that her excess sleepiness might have been related to this.  In spite of stopping this medication her mental status continued to decline, she was found essentially unresponsive at the nursing home on 10/15/2017 and therefore EMS was summoned.  In the emergency room she was found to be slow to arouse, opening eyes and communicating only with loud verbal stimulus.  She will follow commands but quickly falls back asleep.  Diagnostic evaluation demonstrates pulse oximetry in the mid 90s on her typical 2 L nasal cannula, white blood cell count elevated at 16.1, mildly elevated lactic  acidosis at 3.78.  Critical care asked to evaluate given concern for clinical decline.  On critical care arrival she was normotensive, heart rate with normal sinus rhythm, extremities were warm with brisk capillary refill    SUBJECTIVE/interval:  8/3 :  In HD this am ,  Had to be ended early for afib with RVR Daughter says she is more awake and alert Complains of pain in toes  VITAL SIGNS: BP (!) 114/44   Pulse 75   Temp 98.6 F (37 C) (Oral)   Resp 13   Ht 5\' 2"  (1.575 m)   Wt 130 lb 1.1 oz (59 kg)   SpO2 100%   BMI 23.79 kg/m   HEMODYNAMICS:    VENTILATOR SETTINGS:    INTAKE / OUTPUT: I/O last 3 completed shifts: In: 1463 [I.V.:360; IV Piggyback:1103] Out: -   PHYSICAL EXAMINATION:  General:  Moaning, crying out in pain HENT: NCAT OP clear PULM: Crackles bases B, normal effort CV: RRR, systolic murmur noted GI: BS+, soft, nontender Derm: R great toe distal portion is black, cannot palpate DP pulses bilaterally Neuro: awake, alert, no distress, MAEW   LABS:  BMET Recent Labs  Lab 10/15/17 0935 10/15/17 1515 10/16/17 0856  NA 140 141 139  K 4.9 4.7 4.2  CL 98 103 102  CO2 26 23 26   BUN 23 30* 26*  CREATININE 4.51* 4.45* 3.83*  GLUCOSE 143* 123* 101*    Electrolytes Recent Labs  Lab 10/15/17 0935 10/15/17 1515 10/16/17 0856  CALCIUM 9.1 8.9 8.4*  PHOS  --   --  4.2    CBC Recent Labs  Lab 10/15/17 1515 10/16/17 0709 10/16/17 0839  WBC 15.5* 12.4* 11.3*  HGB 8.7* 7.0* 7.0*  HCT 30.7* 24.0* 24.2*  PLT 256 215 216    Coag's Recent Labs  Lab 10/15/17 1515  APTT 40*  INR 1.74    Sepsis Markers Recent Labs  Lab 10/15/17 0949 10/15/17 1515 10/15/17 1835  LATICACIDVEN 3.78* 1.9 1.9  PROCALCITON  --  0.87  --     ABG Recent Labs  Lab 10/15/17 1501 10/16/17 0424  PHART 7.371 7.379  PCO2ART 46.3 43.1  PO2ART 110.0* 78.0*    Liver Enzymes Recent Labs  Lab 10/15/17 0935 10/15/17 1515 10/16/17 0856  AST 35 26  --    ALT 18 16  --   ALKPHOS 94 95  --   BILITOT 0.6 0.6  --   ALBUMIN 2.7* 2.6* 2.5*    Cardiac Enzymes Recent Labs  Lab 10/15/17 1515 10/16/17 0022  TROPONINI 1.30* 1.65*    Glucose Recent Labs  Lab 10/15/17 1133 10/15/17 1536 10/15/17 2011 10/16/17 0007 10/16/17 0358  GLUCAP 148* 129* 110* 97 99    Imaging Ct Head Wo Contrast  Result Date: 10/15/2017 CLINICAL DATA:  Altered level of consciousness. EXAM: CT HEAD WITHOUT CONTRAST TECHNIQUE: Contiguous axial images were obtained from the base of the skull through the vertex without intravenous contrast. COMPARISON:  CT head 07/27/2017 FINDINGS: Brain: Mild atrophy. Moderate chronic microvascular ischemic change in the white matter. Negative for acute infarct, hemorrhage, mass. No change from the prior study. Empty sella incidentally noted. Vascular: Negative for hyperdense vessel Skull: Negative Sinuses/Orbits: Interval development of right mastoid effusion and middle ear effusion. Left mastoid sinus clear. Paranasal sinuses clear. Bilateral cataract surgery. No orbital mass. Other: None IMPRESSION: Atrophy and chronic microvascular ischemia. No acute intracranial abnormality Interval development of right mastoid and middle ear effusion. Electronically Signed   By: Marlan Palauharles  Clark M.D.   On: 10/15/2017 11:13   Dg Chest Port 1 View  Result Date: 10/16/2017 CLINICAL DATA:  Pneumonia. EXAM: PORTABLE CHEST 1 VIEW COMPARISON:  10/15/2017 FINDINGS: The cardiac silhouette remains enlarged. Diffusely increased interstitial markings are similar to the prior study. Calcified pleural plaques are again noted in the right lower hemithorax. There is also mild pleural thickening or calcification in the right lung apex. No sizable pleural effusion or pneumothorax is identified. IMPRESSION: Unchanged diffuse interstitial densities which may reflect mild edema superimposed on chronic lung disease. Electronically Signed   By: Sebastian AcheAllen  Grady M.D.   On:  10/16/2017 08:03     STUDIES:  09/28/2017 ABI > non-compressible arteries, cannot be assessed 10/16/2017 Echo>>  CULTURES: Blood cultures 8/2>> Urine strep antigen 8/2>>>   ANTIBIOTICS: Meropenem 8/2 > Vancomycin 8/2 >  SIGNIFICANT EVENTS: 8/3 new onset a fib with rvr  LINES/TUBES:   DISCUSSION: 84 y/o female with sepsis from aspiration pneumonia.  Overall her prognosis is poor.  She was admitted with encephalopathy likely from pneumonia and taking gabapentin and sedating medicines. She has been admitted multiple times this year with multi-drug resistant organisms.  She complains of pain nearly constantly, has refused medical treatment recently, and nearly every hospitalization develops a new chronic medical problems.    ASSESSMENT / PLAN:  Severe sepsis: hemodynamics better, due to aspiration pneumonia: Plan: Continue meropenem and vancomycin for now Monitor blood cultures  Acute metabolic encephalopathy: slightly improved, still shows cognitive dysfunction likely due to dementia Plan: Minimize sedating medications  Afib: new onset today Plan: Amiodarone  drip Hold anticoagulation as she has converted to sinus rhythm and her risk of bleeding is high  Chronic anemia without bleeding: Plan: Transfuse 1 U prbc now  COPD Plan: Supplemental O2 Continue brovana and pulmicort Would avoid intubation  Pulmonary hypertension: due to COPD Plan: No role for pulmonary vasodilators Caution with volume resuscitation  ESRD: Plan: HD per renal  Dyspnagia: Plan:  Clears for now for comfort SLP evaluation  DM2 Plan:  SSI  Peripheral vascular disease Plan: Not a candidate right now for vascular intervention Continue supportive care for pneumonia, other medical comorbid illnesses  Overall goals of care: Code status DNR in event of cardiac arrest. I talked to the daughter this afternoon about limiting medical treatment further as she continues to have new onset  medical problems and given her overall frail status I worry that medical therapies are causing more harm than good (the encephalopathy due to gabapentin is a good example of this).  The daughter seemed to be somewhat surprised by this conversation but was willing to talk to palliative care.    DVT prophylaxis: [place] heparin  SUP: NA  Diet: NPO Activity: BR Disposition : ICU Code status: Limited code.  She would be a candidate for short-term mechanical ventilation with oral airway, central access, vasoactive drips.  Should she suffer a cardiac arrest she would be DO NOT RESUSCITATE   Heber [place], MD [place] [facility] Pager: [phone] Cell: [phone] After 3pm or if no response, call [phone]   10/16/2017, 10:01 AM

## 2017-10-16 NOTE — Progress Notes (Signed)
[facility]  Afib with RVR Amiodarone  Heber CarolinaBrent Kylil Swopes, MD [place] [facility] Pager: [phone] Cell: [phone] After 3pm or if no response, call [phone]

## 2017-10-16 NOTE — Progress Notes (Signed)
Pt had having asymptomatic rapid afib during HD HR 140 - 160s - without any improvement in HR when taken out of UF.  Only c/o is toe pain. CBC checked x 2 with drop in Hgb to 7. Check FOBT.   Plan transfuse 1 unit PRBC off HD over 3 hours .  She is being rinsed back early due to persistent elevated heart rate.  Bard HerbertMarty Raye Slyter, PA-C [facility].

## 2017-10-16 NOTE — Progress Notes (Signed)
  Echocardiogram 2D Echocardiogram has been performed.  Nicole Cox 10/16/2017, 2:26 PM

## 2017-10-17 DIAGNOSIS — R4182 Altered mental status, unspecified: Secondary | ICD-10-CM

## 2017-10-17 DIAGNOSIS — J181 Lobar pneumonia, unspecified organism: Secondary | ICD-10-CM

## 2017-10-17 LAB — GLUCOSE, CAPILLARY
GLUCOSE-CAPILLARY: 103 mg/dL — AB (ref 70–99)
GLUCOSE-CAPILLARY: 115 mg/dL — AB (ref 70–99)
GLUCOSE-CAPILLARY: 127 mg/dL — AB (ref 70–99)
GLUCOSE-CAPILLARY: 77 mg/dL (ref 70–99)
Glucose-Capillary: 109 mg/dL — ABNORMAL HIGH (ref 70–99)
Glucose-Capillary: 118 mg/dL — ABNORMAL HIGH (ref 70–99)
Glucose-Capillary: 145 mg/dL — ABNORMAL HIGH (ref 70–99)

## 2017-10-17 LAB — CBC
HEMATOCRIT: 27.8 % — AB (ref 36.0–46.0)
HEMOGLOBIN: 8.4 g/dL — AB (ref 12.0–15.0)
MCH: 27 pg (ref 26.0–34.0)
MCHC: 30.2 g/dL (ref 30.0–36.0)
MCV: 89.4 fL (ref 78.0–100.0)
Platelets: 221 10*3/uL (ref 150–400)
RBC: 3.11 MIL/uL — AB (ref 3.87–5.11)
RDW: 20.8 % — ABNORMAL HIGH (ref 11.5–15.5)
WBC: 12.1 10*3/uL — ABNORMAL HIGH (ref 4.0–10.5)

## 2017-10-17 LAB — BASIC METABOLIC PANEL
Anion gap: 12 (ref 5–15)
BUN: 15 mg/dL (ref 8–23)
CHLORIDE: 96 mmol/L — AB (ref 98–111)
CO2: 28 mmol/L (ref 22–32)
Calcium: 8.5 mg/dL — ABNORMAL LOW (ref 8.9–10.3)
Creatinine, Ser: 3.32 mg/dL — ABNORMAL HIGH (ref 0.44–1.00)
GFR calc Af Amer: 14 mL/min — ABNORMAL LOW (ref >60)
GFR calc non Af Amer: 12 mL/min — ABNORMAL LOW (ref >60)
Glucose, Bld: 117 mg/dL — ABNORMAL HIGH (ref 70–99)
POTASSIUM: 3.5 mmol/L (ref 3.5–5.1)
SODIUM: 136 mmol/L (ref 135–145)

## 2017-10-17 LAB — BPAM RBC
Blood Product Expiration Date: 201908272359
ISSUE DATE / TIME: 201908031241
Unit Type and Rh: 5100

## 2017-10-17 LAB — TYPE AND SCREEN
ABO/RH(D): O POS
Antibody Screen: NEGATIVE
Unit division: 0

## 2017-10-17 LAB — PROCALCITONIN: Procalcitonin: 0.81 ng/mL

## 2017-10-17 MED ORDER — RESOURCE THICKENUP CLEAR PO POWD
ORAL | Status: DC | PRN
  Filled 2017-10-17 (×2): qty 125

## 2017-10-17 MED ORDER — CHLORHEXIDINE GLUCONATE CLOTH 2 % EX PADS: 6.0000 | MEDICATED_PAD | Freq: Every day | CUTANEOUS | Status: DC

## 2017-10-17 MED ORDER — ASPIRIN EC 81 MG PO TBEC
81.0000 mg | DELAYED_RELEASE_TABLET | Freq: Every day | ORAL | Status: DC
  Administered 2017-10-17 – 2017-10-27 (×10): 81 mg via ORAL
  Filled 2017-10-17 (×10): qty 1

## 2017-10-17 MED ORDER — IBUPROFEN 200 MG PO TABS
400.0000 mg | ORAL_TABLET | Freq: Once | ORAL | Status: AC
  Administered 2017-10-17: 400 mg via ORAL
  Filled 2017-10-17: qty 2

## 2017-10-17 NOTE — Progress Notes (Signed)
Rectal tube placed d/t patient having multiple loose bowel movements. Patient tolerated ok. Fentanyl given for "toe" pain. Will continue to monitor.

## 2017-10-17 NOTE — Evaluation (Signed)
Clinical/Bedside Swallow Evaluation Patient Details  Name: Nicole DecantLizzie Cox MRN: [id number] Date of Birth: 06/28/1931  Today's Date: 10/17/2017 Time: SLP Start Time (ACUTE ONLY): 1140 SLP Stop Time (ACUTE ONLY): 1155 SLP Time Calculation (min) (ACUTE ONLY): 15 min  Past Medical History:  Past Medical History:  Diagnosis Date  . CHF (congestive heart failure) (HCC)   . COPD (chronic obstructive pulmonary disease) (HCC)   . Coronary artery disease   . Diabetes mellitus without complication (HCC)   . Heart disease   . Hypertension   . Renal disorder   . Stroke [facility](HCC)    Past Surgical History:  Past Surgical History:  Procedure Laterality Date  . ABDOMINAL HYSTERECTOMY    . CHOLECYSTECTOMY    . TEE WITHOUT CARDIOVERSION N/A 08/02/2017   Procedure: TRANSESOPHAGEAL ECHOCARDIOGRAM (TEE);  Surgeon: Lars MassonNelson, Katarina H, MD;  [facility];  Service: Cardiovascular;  Laterality: N/A;  . TONSILLECTOMY     HPI:  84 year old chronically ill female with recent sepsis- now returning 2 weeks later with dec MS, elevated WBC and lactate concerning for sepsis. Hx end-stage renal disease, hypertension, prior stroke, heart failure with preserved ejection fraction, coronary artery disease as well as COPD. CXR 10/16/17 showed "unchanged diffuse interstitial densities which may reflect mild   Assessment / Plan / Recommendation Clinical Impression  Pt's vital signs remain stable during limited trials of thin and nectar thick liquids, pureed solids, however her cognition appears to impact timing of swallow initiation. As deep laryngeal penetration occurred on recent MBS without sensation, would cautiously initiate dys 1, nectar thick liquids VIA STRAW as this decreased severity of premature spillage and prevented airway compromise with nectar. No family present; d/w RN and will follow up for tolerance, advancement vs need for repeat MBS. SLP Visit Diagnosis: Dysphagia, oropharyngeal phase (R13.12)     Aspiration Risk  Moderate aspiration risk    Diet Recommendation Dysphagia 1 (Puree);Nectar-thick liquid   Liquid Administration via: Straw Medication Administration: Whole meds with puree Supervision: Staff to assist with self feeding;Full supervision/cueing for compensatory strategies Compensations: Minimize environmental distractions;Slow rate;Small sips/bites    Other  Recommendations Oral Care Recommendations: Oral care BID Other Recommendations: Order thickener from pharmacy   Follow up Recommendations Skilled Nursing facility      Frequency and Duration min 2x/week  2 weeks       Prognosis Prognosis for Safe Diet Advancement: Good Barriers to Reach Goals: Cognitive deficits      Swallow Study   General Date of Onset: 10/15/17 HPI: 84 year old chronically ill female with recent sepsis- now returning 2 weeks later with dec MS, elevated WBC and lactate concerning for sepsis. Hx end-stage renal disease, hypertension, prior stroke, heart failure with preserved ejection fraction, coronary artery disease as well as COPD. CXR 10/16/17 showed "unchanged diffuse interstitial densities which may reflect mild Type of Study: Bedside Swallow Evaluation Previous Swallow Assessment: see HPI Diet Prior to this Study: Thin liquids Respiratory Status: Nasal cannula History of Recent Intubation: No Behavior/Cognition: Alert;Cooperative;Requires cueing;Impulsive Oral Cavity Assessment: Within Functional Limits Oral Care Completed by SLP: No Oral Cavity - Dentition: Missing dentition Self-Feeding Abilities: Needs assist Patient Positioning: Upright in bed Baseline Vocal Quality: Normal Volitional Cough: Weak Volitional Swallow: Able to elicit    Oral/Motor/Sensory Function Overall Oral Motor/Sensory Function: Within functional limits   Ice Chips Ice chips: Not tested   Thin Liquid Thin Liquid: Impaired Presentation: Cup(pt willing to take very little PO) Pharyngeal  Phase  Impairments: Suspected delayed Swallow;Cough -  Delayed    Nectar Thick Nectar Thick Liquid: Impaired Presentation: Cup Pharyngeal Phase Impairments: Suspected delayed Swallow   Honey Thick Honey Thick Liquid: Not tested   Puree Puree: Within functional limits Presentation: Spoon   Solid  Rondel Baton, [place], CCC-SLP Speech-Language Pathologist [phone]    Solid: Not tested      Nicole Cox 10/17/2017,12:03 PM

## 2017-10-17 NOTE — Progress Notes (Signed)
Subjective:  Went into rapid Afib with HD yesterday- treatment cut short a little.  Only able to remove 1 liter- now in NSR after amiodarone started - more alert today, asking for pain medicine  Objective Vital signs in last 24 hours: Vitals:   10/17/17 0500 10/17/17 0530 10/17/17 0600 10/17/17 0630  BP: (!) 120/46 (!) 120/45 (!) 129/46 (!) 119/46  Pulse: (!) 59 67 68 66  Resp: 17 14 17 17   Temp:      TempSrc:      SpO2: 100% 100% 100% 100%  Weight:      Height:       Weight change: -0.4 kg (-14.1 oz)  Intake/Output Summary (Last 24 hours) at 10/17/2017 0752 Last data filed at 10/17/2017 0600 Gross per 24 hour  Intake 1705.81 ml  Output 1099 ml  Net 606.81 ml    Dialyzes at [facility]- [facility]- [place] - TTS- 3 hours-  EDW 121. Some variable weights of late- got full tx 8/1 but no volume removal sec to nausea HD Bath 2K/2.5 calc, Dialyzer 180, Heparin yes- 2000 bolus and 250 per hour. Access AVG- 400 BFR. Gets mircera 150 q 2 weeks- last given 7/25- last hgb at center 8.7 hect 1 mcg IV q tx   Assessment/Plan: 84 year old chronically ill female with recent sepsis- now returning 2 weeks later with dec MS, elevated WBC and lactate concerning for sepsis 1  Sepsis- per CCM- possibly incomplete tx of prior bacteremia or new issue.  Empiric meropenem and vanc- WBC trending down- cultures neg so far  2 ESRD: normally TTS as OP via AVG. Received most of tx yesterday via AVG - but much above EDW so have written for extra tx 8/5 3 Hypertension: normally very hypertensive on multiple meds so BP right now a little unusual for her and could be leading to some somnolence.  Holding BP meds and follow.  Is quite a bit above her EDW- 8#- and CXR looks abnormal-  I will put in orders for an extra tx tomorrow off schedule to try and make a dent in this 4. Anemia of ESRD: significant - now some lower than OP setting- will give ESA but hold any iron in the setting of sepsis- transfused one unit 8/3 5. Metabolic Bone  Disease: continue hectorol with HD- no other meds for now 6.  Decreased MS- possibly due to sepsis vs reported new neurontin as med- seems better 7. Afib- converted to NSR with amiodarone on board 8. Toe pain- major issue for her- some dry gangrene to tip of bilat big toes- may need to address this hosp      Montrail Mehrer A    Labs: Basic Metabolic Panel: Recent Labs  Lab 10/16/17 0709 10/16/17 0856 10/17/17 0331  NA 141 139 136  K 4.7 4.2 3.5  CL 104 102 96*  CO2 24 26 28   GLUCOSE 99 101* 117*  BUN 37* 26* 15  CREATININE 5.41* 3.83* 3.32*  CALCIUM 8.8* 8.4* 8.5*  PHOS 6.0* 4.2  --    Liver Function Tests: Recent Labs  Lab 10/15/17 0935 10/15/17 1515 10/16/17 0856  AST 35 26  --   ALT 18 16  --   ALKPHOS 94 95  --   BILITOT 0.6 0.6  --   PROT 8.4* 7.7  --   ALBUMIN 2.7* 2.6* 2.5*   Recent Labs  Lab 10/15/17 0935  LIPASE 45   No results for input(s): AMMONIA in the last 168 hours. CBC: Recent  Labs  Lab 10/15/17 0935 10/15/17 1515 10/16/17 0709 10/16/17 0839  WBC 16.1* 15.5* 12.4* 11.3*  NEUTROABS 13.0* 11.4*  --   --   HGB 8.9* 8.7* 7.0* 7.0*  HCT 31.3* 30.7* 24.0* 24.2*  MCV 92.6 93.0 93.0 92.7  PLT 300 256 215 216   Cardiac Enzymes: Recent Labs  Lab 10/15/17 1515 10/16/17 0022 10/16/17 0709  TROPONINI 1.30* 1.65* 1.33*   CBG: Recent Labs  Lab 10/16/17 1227 10/16/17 1604 10/16/17 2015 10/16/17 2355 10/17/17 0340  GLUCAP 68* 75 119* 118* 115*    Iron Studies: No results for input(s): IRON, TIBC, TRANSFERRIN, FERRITIN in the last 72 hours. Studies/Results: Ct Head Wo Contrast  Result Date: 10/15/2017 CLINICAL DATA:  Altered level of consciousness. EXAM: CT HEAD WITHOUT CONTRAST TECHNIQUE: Contiguous axial images were obtained from the base of the skull through the vertex without intravenous contrast. COMPARISON:  CT head 07/27/2017 FINDINGS: Brain: Mild atrophy. Moderate chronic microvascular ischemic change in the white matter.  Negative for acute infarct, hemorrhage, mass. No change from the prior study. Empty sella incidentally noted. Vascular: Negative for hyperdense vessel Skull: Negative Sinuses/Orbits: Interval development of right mastoid effusion and middle ear effusion. Left mastoid sinus clear. Paranasal sinuses clear. Bilateral cataract surgery. No orbital mass. Other: None IMPRESSION: Atrophy and chronic microvascular ischemia. No acute intracranial abnormality Interval development of right mastoid and middle ear effusion. Electronically Signed   By: Marlan Palau M.D.   On: 10/15/2017 11:13   Dg Chest Port 1 View  Result Date: 10/16/2017 CLINICAL DATA:  Pneumonia. EXAM: PORTABLE CHEST 1 VIEW COMPARISON:  10/15/2017 FINDINGS: The cardiac silhouette remains enlarged. Diffusely increased interstitial markings are similar to the prior study. Calcified pleural plaques are again noted in the right lower hemithorax. There is also mild pleural thickening or calcification in the right lung apex. No sizable pleural effusion or pneumothorax is identified. IMPRESSION: Unchanged diffuse interstitial densities which may reflect mild edema superimposed on chronic lung disease. Electronically Signed   By: Sebastian Ache M.D.   On: 10/16/2017 08:03   Dg Chest Portable 1 View  Result Date: 10/15/2017 CLINICAL DATA:  Shortness of breath. EXAM: PORTABLE CHEST 1 VIEW COMPARISON:  10/01/2017 FINDINGS: The heart is enlarged. There is tortuosity and calcification of the thoracic aorta. Extensive interstitial changes appear relatively stable and could reflect interstitial disease or interstitial edema. No focal infiltrates. Dense calcifications at the right lung base likely pleural calcifications related to prior hemothorax or empyema. No definite pleural effusions. Chest CT may be helpful for further evaluation. IMPRESSION: Cardiac enlargement and probable interstitial edema superimposed on chronic lung disease. CT may be helpful for further  evaluation. Electronically Signed   By: Rudie Meyer M.D.   On: 10/15/2017 09:42   Medications: Infusions: . sodium chloride 10 mL/hr at 10/17/17 0600  . sodium chloride    . amiodarone Stopped (10/17/17 0345)  . meropenem (MERREM) IV 200 mL/hr at 10/16/17 1825  . vancomycin 500 mg (10/16/17 1625)    Scheduled Medications: . arformoterol  15 mcg Nebulization BID  . aspirin  300 mg Rectal Daily  . budesonide (PULMICORT) nebulizer solution  0.5 mg Nebulization BID  . chlorhexidine  15 mL Mouth Rinse BID  . Chlorhexidine Gluconate Cloth  6 each Topical Q0600  . darbepoetin (ARANESP) injection - DIALYSIS  200 mcg Intravenous Q Sat-HD  . diphenhydrAMINE  25 mg Oral Once  . heparin  5,000 Units Subcutaneous Q8H  . insulin aspart  0-9 Units Subcutaneous Q4H  .  mouth rinse  15 mL Mouth Rinse q12n4p  . sodium chloride flush  3 mL Intravenous Q12H    have reviewed scheduled and prn medications.  Physical Exam: General: maybe a little more alert this AM- moaning c/o toe pain Heart: RRR Lungs: dec BS at bases Abdomen: soft, non tender Extremities: dependent edema- dry gangrene to both big toes Dialysis Access: left upper arm AVG- patent    10/17/2017,7:52 AM  LOS: 2 days

## 2017-10-17 NOTE — Progress Notes (Signed)
[facility] on call notified of patient having a 24 beat run of V-tach. Asymptomatic. BP 149/59 (84) RR-13, HR 99, o2 sat 100%, Temp 98.1. Will continue to monitor.

## 2017-10-17 NOTE — Progress Notes (Signed)
PROGRESS NOTE    Nicole Cox  ZOX:[id number] DOB: 10/05/1931 DOA: 10/15/2017 PCP: Joana Reamer, MD    Brief Narrative:  84 yo female who presented with altered mental status. She does have the significant medical history of ESRD on HD, HTN, CVA, diastolic heart failure, CAD and COPD. Recent hospitalization for Acinetobacter pneumonia. Discharged (07/20) on Imipenem that she did not complete due to declined IV access at the SNF. Was noted to have progressive weakness and dyspnea prior to hospitalization, on her initial physical exam blood pressure 118/44, heart rate 88, temperature 97.9, oxygen saturation 100%, respiratory rate 15. Patient was somnolent, lungs with decreased breath sounds bilateral, bilateral rales, heart S1-S2 present rhythmic, positive 3/6 murmur at the left sternal border, abdomen soft, protuberant, no lower extremity edema. Sodium 141, potassium 4.7, chloride 103, bicarbonate 23, glucose 123, BUN 30, creatinine 4.4, troponin 1.3, white count 15.5, hemoglobin 8.7, hematocrit 30.7, platelets 256. Head CT without atrophy, no acute changes. Chest x-ray with right lower lobe alveolar/interstitial infiltrate. EKG sinus rhythm, PACs. Normal axis, normal intervals.  Patient was admitted to hospital with working diagnosis of right lower lobe pneumonia  Assessment & Plan:   Active Problems:   Sepsis (HCC)   1. Aspiration pneumonia (present on admission). Continue broad spectrum antibiotic therapy with IV vanc and meropenem, will continue to follow cell count, cultures and temperature curve.   2. Acute metabolic encephalopathy. Patient is more awake and reactive, will continue aspiration precautions and neuro checks per unit protocol. Continue pain control with fentanyl as needed.   3. New onset atrial fibrillation.Patient has converted to sinus rhythm, amiodarone has been discontinues, will change asa to oral from rectal. Will continue telemetry monitoring, no further  anticoagulation due to bleeding risk.   4. ESRD on HD with anemia of chronic renal disease. Patient tolerating well renal replacement therapy, will follow cbc today, patient is sp one unit prbc transfusion. Follow cell count in am. Continue aranesp.   5. COPD with pulmonary hypertension. No clinical signs of copd exacerbation, will continue oxymetry monitoring and supplemental 02 per [place]. Continue arfomoterol and budesonide  6. T2DM. Will continue glucose cover and monitoring with insulin sliding scale, patient with poor oral intake. Capillary glucose 119, 118, 115, 109, 127.   DVT prophylaxis: scd   Code Status: dnr  Family Communication: no family at the bedside  Disposition Plan/ discharge barriers: pending clinical improvement.    Consultants:     Procedures:     Antimicrobials:  Vancomycin  Imipenem   Subjective: Patient confused last night and in apparent pain, had fentanyl this am. No chest pain, no nausea or vomiting, no dyspnea. Patient converted to sinus rhythm and amiodarone drip was discontinued.   Objective: Vitals:   10/17/17 0807 10/17/17 0809 10/17/17 0812 10/17/17 0900  BP:    (!) 137/52  Pulse:    73  Resp:    13  Temp: 97.7 F (36.5 C)     TempSrc: Axillary     SpO2:  100% 100% 100%  Weight:      Height:        Intake/Output Summary (Last 24 hours) at 10/17/2017 0936 Last data filed at 10/17/2017 0900 Gross per 24 hour  Intake 1725.81 ml  Output 1099 ml  Net 626.81 ml   Filed Weights   10/16/17 0344 10/16/17 0808 10/17/17 0428  Weight: 58.3 kg (128 lb 8.5 oz) 59 kg (130 lb 1.1 oz) 58.8 kg (129 lb 10.1 oz)    Examination:  General: Not in pain or dyspnea, deconditioned  Neurology: Awake and alert, non focal  E ENT: mild  pallor, no icterus, oral mucosa moist Cardiovascular: No JVD. S1-S2 present, rhythmic, no gallops, rubs, or murmurs. No lower extremity edema. Pulmonary: positive breath sounds bilaterally, adequate air movement, no  wheezing, rhonchi or rales. Gastrointestinal. Abdomen mild distended with no organomegaly, non tender, no rebound or guarding Skin. No rashes Musculoskeletal: no joint deformities     Data Reviewed: I have personally reviewed following labs and imaging studies  CBC: Recent Labs  Lab 10/15/17 0935 10/15/17 1515 10/16/17 0709 10/16/17 0839  WBC 16.1* 15.5* 12.4* 11.3*  NEUTROABS 13.0* 11.4*  --   --   HGB 8.9* 8.7* 7.0* 7.0*  HCT 31.3* 30.7* 24.0* 24.2*  MCV 92.6 93.0 93.0 92.7  PLT 300 256 215 216   Basic Metabolic Panel: Recent Labs  Lab 10/15/17 0935 10/15/17 1515 10/16/17 0709 10/16/17 0856 10/17/17 0331  NA 140 141 141 139 136  K 4.9 4.7 4.7 4.2 3.5  CL 98 103 104 102 96*  CO2 26 23 24 26 28   GLUCOSE 143* 123* 99 101* 117*  BUN 23 30* 37* 26* 15  CREATININE 4.51* 4.45* 5.41* 3.83* 3.32*  CALCIUM 9.1 8.9 8.8* 8.4* 8.5*  MG  --   --  2.0  --   --   PHOS  --   --  6.0* 4.2  --    GFR: Estimated Creatinine Clearance: 10.2 mL/min (A) (by C-G formula based on SCr of 3.32 mg/dL (H)). Liver Function Tests: Recent Labs  Lab 10/15/17 0935 10/15/17 1515 10/16/17 0856  AST 35 26  --   ALT 18 16  --   ALKPHOS 94 95  --   BILITOT 0.6 0.6  --   PROT 8.4* 7.7  --   ALBUMIN 2.7* 2.6* 2.5*   Recent Labs  Lab 10/15/17 0935  LIPASE 45   No results for input(s): AMMONIA in the last 168 hours. Coagulation Profile: Recent Labs  Lab 10/15/17 1515  INR 1.74   Cardiac Enzymes: Recent Labs  Lab 10/15/17 1515 10/16/17 0022 10/16/17 0709  TROPONINI 1.30* 1.65* 1.33*   BNP (last 3 results) No results for input(s): PROBNP in the last 8760 hours. HbA1C: No results for input(s): HGBA1C in the last 72 hours. CBG: Recent Labs  Lab 10/16/17 1604 10/16/17 2015 10/16/17 2355 10/17/17 0340 10/17/17 0813  GLUCAP 75 119* 118* 115* 109*   Lipid Profile: No results for input(s): CHOL, HDL, LDLCALC, TRIG, CHOLHDL, LDLDIRECT in the last 72 hours. Thyroid Function  Tests: No results for input(s): TSH, T4TOTAL, FREET4, T3FREE, THYROIDAB in the last 72 hours. Anemia Panel: No results for input(s): VITAMINB12, FOLATE, FERRITIN, TIBC, IRON, RETICCTPCT in the last 72 hours.    Radiology Studies: I have reviewed all of the imaging during this hospital visit personally     Scheduled Meds: . arformoterol  15 mcg Nebulization BID  . aspirin  300 mg Rectal Daily  . budesonide (PULMICORT) nebulizer solution  0.5 mg Nebulization BID  . chlorhexidine  15 mL Mouth Rinse BID  . Chlorhexidine Gluconate Cloth  6 each Topical Q0600  . Chlorhexidine Gluconate Cloth  6 each Topical Q0600  . darbepoetin (ARANESP) injection - DIALYSIS  200 mcg Intravenous Q Sat-HD  . diphenhydrAMINE  25 mg Oral Once  . heparin  5,000 Units Subcutaneous Q8H  . insulin aspart  0-9 Units Subcutaneous Q4H  . mouth rinse  15 mL Mouth Rinse q12n4p  .  sodium chloride flush  3 mL Intravenous Q12H   Continuous Infusions: . sodium chloride 10 mL/hr at 10/17/17 0600  . sodium chloride    . amiodarone Stopped (10/17/17 0345)  . meropenem (MERREM) IV 200 mL/hr at 10/16/17 1825  . vancomycin 500 mg (10/16/17 1625)     LOS: 2 days        Charlsie Fleeger Annett Gula, MD [facility] Pager [phone]

## 2017-10-17 NOTE — Progress Notes (Signed)
eLink Physician-Brief Progress Note Patient Name: Nicole Cox DOB: 1931-04-20 MRN: [id number]   Date of Service  10/17/2017  HPI/Events of Note  Patient c/o pain.   eICU Interventions  Will order: 1. Motrin 400 mg X 1 now.      Intervention Category Intermediate Interventions: Pain - evaluation and management  Nicole Cox 10/17/2017, 2:05 AM

## 2017-10-18 ENCOUNTER — Inpatient Hospital Stay (HOSPITAL_COMMUNITY): Payer: Medicare HMO

## 2017-10-18 ENCOUNTER — Encounter (HOSPITAL_COMMUNITY): Payer: Medicare HMO

## 2017-10-18 DIAGNOSIS — N186 End stage renal disease: Secondary | ICD-10-CM

## 2017-10-18 DIAGNOSIS — I96 Gangrene, not elsewhere classified: Secondary | ICD-10-CM

## 2017-10-18 DIAGNOSIS — I481 Persistent atrial fibrillation: Secondary | ICD-10-CM

## 2017-10-18 DIAGNOSIS — Z992 Dependence on renal dialysis: Secondary | ICD-10-CM

## 2017-10-18 DIAGNOSIS — Z515 Encounter for palliative care: Secondary | ICD-10-CM

## 2017-10-18 DIAGNOSIS — R4182 Altered mental status, unspecified: Secondary | ICD-10-CM

## 2017-10-18 DIAGNOSIS — I739 Peripheral vascular disease, unspecified: Secondary | ICD-10-CM

## 2017-10-18 DIAGNOSIS — I4819 Other persistent atrial fibrillation: Secondary | ICD-10-CM

## 2017-10-18 LAB — CBC WITH DIFFERENTIAL/PLATELET
Abs Immature Granulocytes: 0.4 10*3/uL — ABNORMAL HIGH (ref 0.0–0.1)
BASOS PCT: 0 %
Basophils Absolute: 0.1 10*3/uL (ref 0.0–0.1)
EOS ABS: 0.6 10*3/uL (ref 0.0–0.7)
Eosinophils Relative: 4 %
HEMATOCRIT: 29.7 % — AB (ref 36.0–46.0)
Hemoglobin: 8.8 g/dL — ABNORMAL LOW (ref 12.0–15.0)
Immature Granulocytes: 3 %
LYMPHS ABS: 1.3 10*3/uL (ref 0.7–4.0)
Lymphocytes Relative: 10 %
MCH: 26.1 pg (ref 26.0–34.0)
MCHC: 29.6 g/dL — ABNORMAL LOW (ref 30.0–36.0)
MCV: 88.1 fL (ref 78.0–100.0)
MONO ABS: 1 10*3/uL (ref 0.1–1.0)
MONOS PCT: 8 %
Neutro Abs: 10.1 10*3/uL — ABNORMAL HIGH (ref 1.7–7.7)
Neutrophils Relative %: 75 %
Platelets: 256 10*3/uL (ref 150–400)
RBC: 3.37 MIL/uL — ABNORMAL LOW (ref 3.87–5.11)
RDW: 20.2 % — AB (ref 11.5–15.5)
WBC: 13.4 10*3/uL — ABNORMAL HIGH (ref 4.0–10.5)

## 2017-10-18 LAB — BASIC METABOLIC PANEL
Anion gap: 13 (ref 5–15)
BUN: 22 mg/dL (ref 8–23)
CHLORIDE: 97 mmol/L — AB (ref 98–111)
CO2: 25 mmol/L (ref 22–32)
Calcium: 8.7 mg/dL — ABNORMAL LOW (ref 8.9–10.3)
Creatinine, Ser: 4.63 mg/dL — ABNORMAL HIGH (ref 0.44–1.00)
GFR calc Af Amer: 9 mL/min — ABNORMAL LOW (ref >60)
GFR calc non Af Amer: 8 mL/min — ABNORMAL LOW (ref >60)
GLUCOSE: 106 mg/dL — AB (ref 70–99)
POTASSIUM: 3.7 mmol/L (ref 3.5–5.1)
Sodium: 135 mmol/L (ref 135–145)

## 2017-10-18 LAB — GLUCOSE, CAPILLARY
GLUCOSE-CAPILLARY: 94 mg/dL (ref 70–99)
Glucose-Capillary: 100 mg/dL — ABNORMAL HIGH (ref 70–99)
Glucose-Capillary: 89 mg/dL (ref 70–99)
Glucose-Capillary: 82 mg/dL (ref 70–99)

## 2017-10-18 MED ORDER — FENTANYL 25 MCG/HR TD PT72
25.0000 ug | MEDICATED_PATCH | TRANSDERMAL | Status: DC
  Administered 2017-10-18: 25 ug via TRANSDERMAL
  Filled 2017-10-18: qty 1

## 2017-10-18 MED ORDER — CHLORHEXIDINE GLUCONATE CLOTH 2 % EX PADS: 6.0000 | MEDICATED_PAD | Freq: Every day | CUTANEOUS | Status: DC

## 2017-10-18 MED ORDER — FENTANYL CITRATE (PF) 100 MCG/2ML IJ SOLN
INTRAMUSCULAR | Status: AC
  Filled 2017-10-18: qty 2

## 2017-10-18 NOTE — Progress Notes (Signed)
Patient arrived from HD via bed as a transfer from [id number]. Patient oriented to room. Patient is tearful with cries to Jesus. Patient comforted and repositioned. Stage II sacral ulcer noted on assessment. New Foam dressing placed. Dondra SpryMoore, Sharae Zappulla Islee, RN

## 2017-10-18 NOTE — Consult Note (Addendum)
WOC Nurse wound consult note Reason for Consult: Consult requested for sacrum and buttocks. Pt is frequently incontinent of liquid stool and flexiseal has been inserted to attempt to contain stool, although it still leaks around the insertion site and has contributed towards moisture associated skin damage.  Wound type:  Stage 2 with MASD across sacrum and bilat buttocks; affected area is approx 5X7X.2cm Pressure Injury POA: Yes Wound bed: red, moist, maceration Dressing procedure/placement/frequency: Pt is on a low airloss bed to reduce pressure.  Foam dressing to protect and promote healing.  No family present to discuss plan of care. Please re-consult if further assistance is needed.  Thank-you,  Cammie Mcgeeawn Suhail Peloquin MSN, RN, CWOCN, [place]CN-AP, CNS [phone]

## 2017-10-18 NOTE — Progress Notes (Signed)
Subjective:  On HD now, pt looks uncomfortable, not in distress, follows simple commands  Objective Vital signs in last 24 hours: Vitals:   10/18/17 1330 10/18/17 1400 10/18/17 1430 10/18/17 1500  BP: (!) 174/77 (!) 166/52 (!) 156/56 (!) (P) 168/74  Pulse: 87 86 88 (P) 87  Resp: 18 17 18  (P) 18  Temp:      TempSrc:      SpO2: 100% 100% 100% (P) 100%  Weight:      Height:       Weight change: -0.3 kg (-10.6 oz)  Intake/Output Summary (Last 24 hours) at 10/18/2017 1522 Last data filed at 10/18/2017 0920 Gross per 24 hour  Intake 1340.3 ml  Output 1 ml  Net 1339.3 ml    Dialysis: [facility]/ Dr Louis MeckelStovall TTS 3h  121lbs   2/2.5   Hep 2000 then 250 / hr  AVG  - mircera 150 q 2 weeks- last given 7/25; last hgb at center 8.7 - hect 1 mcg IV q tx   Summary: 84 year old chronically ill female with recent sepsis- now returning 2 weeks later with dec MS, elevated WBC and lactate concerning for sepsis  Assessment/Plan: 1 AMS/ Asp PNA/ sepsis - per CCM, on Meropenem IV day #4 2 ESRD: normally TTS as OP via AVG. Extra HD today, up 4-5kg. Reg HD tomorrow.  3 Hypertension: normally very hypertensive on multiple meds. CXR looks fluid excess, getting extra HD today for volume as tol.  4  Anemia of ESRD: significant - now some lower than OP setting- will give ESA but hold any iron in the setting of sepsis- transfused one unit 8/3 5. Metabolic Bone Disease: continue hectorol with HD- no other meds for now 6.  Decreased MS- possibly due to sepsis vs reported new neurontin, improving 7. Afib- converted to NSR with amiodarone on board 8. Toe pain- major issue for her- some dry gangrene to tip of bilat big toes, per primary    Vinson Moselleob Berit Raczkowski MD [facility] pgr [phone]   10/18/2017, 3:22 PM   Labs: Basic Metabolic Panel: Recent Labs  Lab 10/16/17 0709 10/16/17 0856 10/17/17 0331 10/18/17 0403  NA 141 139 136 135  K 4.7 4.2 3.5 3.7  CL 104 102 96* 97*  CO2 24 26  28 25   GLUCOSE 99 101* 117* 106*  BUN 37* 26* 15 22  CREATININE 5.41* 3.83* 3.32* 4.63*  CALCIUM 8.8* 8.4* 8.5* 8.7*  PHOS 6.0* 4.2  --   --    Liver Function Tests: Recent Labs  Lab 10/15/17 0935 10/15/17 1515 10/16/17 0856  AST 35 26  --   ALT 18 16  --   ALKPHOS 94 95  --   BILITOT 0.6 0.6  --   PROT 8.4* 7.7  --   ALBUMIN 2.7* 2.6* 2.5*   Recent Labs  Lab 10/15/17 0935  LIPASE 45   No results for input(s): AMMONIA in the last 168 hours. CBC: Recent Labs  Lab 10/15/17 0935 10/15/17 1515 10/16/17 0709 10/16/17 0839 10/17/17 1021 10/18/17 0403  WBC 16.1* 15.5* 12.4* 11.3* 12.1* 13.4*  NEUTROABS 13.0* 11.4*  --   --   --  10.1*  HGB 8.9* 8.7* 7.0* 7.0* 8.4* 8.8*  HCT 31.3* 30.7* 24.0* 24.2* 27.8* 29.7*  MCV 92.6 93.0 93.0 92.7 89.4 88.1  PLT 300 256 215 216 221 256   Cardiac Enzymes: Recent Labs  Lab 10/15/17 1515 10/16/17 0022 10/16/17 0709  TROPONINI 1.30* 1.65* 1.33*  CBG: Recent Labs  Lab 10/17/17 1918 10/17/17 2338 10/18/17 0341 10/18/17 0738 10/18/17 1120  GLUCAP 77 103* 100* 89 94    Iron Studies: No results for input(s): IRON, TIBC, TRANSFERRIN, FERRITIN in the last 72 hours. Studies/Results: No results found. Medications: Infusions: . sodium chloride 10 mL/hr at 10/17/17 0600  . sodium chloride Stopped (10/17/17 2100)  . meropenem (MERREM) IV Stopped (10/17/17 1748)    Scheduled Medications: . arformoterol  15 mcg Nebulization BID  . aspirin EC  81 mg Oral Daily  . budesonide (PULMICORT) nebulizer solution  0.5 mg Nebulization BID  . chlorhexidine  15 mL Mouth Rinse BID  . Chlorhexidine Gluconate Cloth  6 each Topical Q0600  . darbepoetin (ARANESP) injection - DIALYSIS  200 mcg Intravenous Q Sat-HD  . diphenhydrAMINE  25 mg Oral Once  . fentaNYL  25 mcg Transdermal Q72H  . heparin  5,000 Units Subcutaneous Q8H  . insulin aspart  0-9 Units Subcutaneous Q4H  . mouth rinse  15 mL Mouth Rinse q12n4p  . sodium chloride flush  3  mL Intravenous Q12H    have reviewed scheduled and prn medications.  Physical Exam: General: moaning, arouses easily, follows simple commands, not in distress Neck: no jvd or bruits Heart: RRR Lungs: dec BS at bases Abdomen: soft, non tender Extremities: mild dependent hip edema; dry gangrene to both big toes Dialysis Access: left upper arm AVG- patent    10/18/2017,3:22 PM  LOS: 3 days

## 2017-10-18 NOTE — Plan of Care (Signed)
°  Problem: Clinical Measurements: °Goal: Ability to maintain clinical measurements within normal limits will improve °Outcome: Progressing °Goal: Will remain free from infection °Outcome: Progressing °Goal: Diagnostic test results will improve °Outcome: Progressing °Goal: Respiratory complications will improve °Outcome: Progressing °  °

## 2017-10-18 NOTE — Care Management Important Message (Signed)
Important Message  Patient Details  Name: Nicole Cox MRN: [name] Date of Birth: 05-25-1931   Medicare Important Message Given:  Yes    Leone Havenaylor, Yanira Tolsma Clinton, RN 10/18/2017, 2:53 PM

## 2017-10-18 NOTE — Progress Notes (Signed)
Preliminary notes--Bilateral lower extremities arterial duplex exam completed.  Hongying Bora Broner (RDMS RVT) 10/18/17 2:40 PM

## 2017-10-18 NOTE — Progress Notes (Signed)
PROGRESS NOTE    Nicole Cox  WUJ:[id number] DOB: 1932-03-01 DOA: 10/15/2017 PCP: Joana Reamer, MD    Brief Narrative:  84 yo female who presented with altered mental status. She does have the significant medical history of ESRD on HD, HTN, CVA, diastolic heart failure, CAD and COPD. Recent hospitalization for Acinetobacter pneumonia. Discharged (07/20) on Imipenem that she did not complete due to declined IV access at the SNF. She was noted to have progressive weakness and dyspnea prior to hospitalization, on her initial physical exam blood pressure 118/44, heart rate 88, temperature 97.9, oxygen saturation 100%, respiratory rate 15. Patient was somnolent, lungs with decreased breath sounds bilateral, bilateral rales, heart S1-S2 present rhythmic, positive 3/6 murmur at the left sternal border, abdomen soft, protuberant, no lower extremity edema. Sodium 141, potassium 4.7, chloride 103, bicarbonate 23, glucose 123, BUN 30, creatinine 4.4, troponin 1.3, white count 15.5, hemoglobin 8.7, hematocrit 30.7, platelets 256. Head CT without atrophy, no acute changes. Chest x-ray with right lower lobe alveolar/interstitial infiltrate. EKG sinus rhythm, PACs. Normal axis, normal intervals.  Patient was admitted to hospital with working diagnosis of right lower lobe pneumonia   Assessment & Plan:   Active Problems:   Sepsis (HCC)   1. Aspiration pneumonia (present on admission). Will continue antibiotic therapy with IV meropenem and will discontinue IV vancomycin. Will plan to treat for 8 complete days. Today patient is more awake and alert, aspiration precautions.    2. Acute metabolic encephalopathy. Continue to improve mentation, continue neuro checks, continue physical therapy evaluation.   3. New onset atrial fibrillation. Continue telemetry monitoring, no further anticoagulation due to high risk of bleeding .   4. ESRD on HD with anemia of chronic renal disease.  Scheduled for HD,  today, clinically euvolemic.   5. COPD with pulmonary hypertension Has no signs of copd exacerbation, on arfomoterol and budesonide. Oxymetry is 100 % on room air.   6. T2DM. Glucose cover and monitoring with insulin sliding scale, patient with poor oral intake. Capillary glucose  77, 103, 100, 89, 94. Tolerating po well.  7. New right foot great toe necrosis. Painful area of necrosis at the tip of the first toe. Patient high pretest probability for PVD, will check arterial dopplers of the lower extremities.   DVT prophylaxis: scd   Code Status: dnr  Family Communication: no family at the bedside  Disposition Plan/ discharge barriers: pending clinical improvement.    Consultants:     Procedures:     Antimicrobials:    Imipenem  Subjective: Patient is more awake and alert, complains of right foot first toe pain, moderate to severe, worse to the touch, improved with analgesics, no radiation. Dyspnea has improved, no cough or wheezing.   Objective: Vitals:   10/18/17 0700 10/18/17 0732 10/18/17 0736 10/18/17 0800  BP: (!) 155/55   (!) 143/51  Pulse: 81 80  79  Resp: 19 17  18   Temp: 98.6 F (37 C)     TempSrc: Oral     SpO2: 99% 100% 100% 100%  Weight:      Height:        Intake/Output Summary (Last 24 hours) at 10/18/2017 0937 Last data filed at 10/18/2017 0920 Gross per 24 hour  Intake 1340.3 ml  Output 1 ml  Net 1339.3 ml   Filed Weights   10/16/17 0808 10/17/17 0428 10/18/17 0500  Weight: 59 kg (130 lb 1.1 oz) 58.8 kg (129 lb 10.1 oz) 58.7 kg (129 lb 6.6 oz)  Examination:   General: Not in pain or dyspnea, deconditioned and ill looking appearing  Neurology: Awake and alert, non focal  E ENT: mild pallor, no icterus, oral mucosa moist Cardiovascular: No JVD. S1-S2 present, rhythmic, no gallops, rubs, or murmurs. No lower extremity edema. Decreased pedal pulses bilaterally.  Pulmonary: decreased breath sounds bilaterally at bases, adequate air  movement, no wheezing, rhonchi or rales. Gastrointestinal. Abdomen with no organomegaly, non tender, no rebound or guarding Skin. Right foot first toe with necrosis at the tip, tender to palpation, with no erythema or edema.  Musculoskeletal: no joint deformities       Data Reviewed: I have personally reviewed following labs and imaging studies  CBC: Recent Labs  Lab 10/15/17 0935 10/15/17 1515 10/16/17 0709 10/16/17 0839 10/17/17 1021 10/18/17 0403  WBC 16.1* 15.5* 12.4* 11.3* 12.1* 13.4*  NEUTROABS 13.0* 11.4*  --   --   --  10.1*  HGB 8.9* 8.7* 7.0* 7.0* 8.4* 8.8*  HCT 31.3* 30.7* 24.0* 24.2* 27.8* 29.7*  MCV 92.6 93.0 93.0 92.7 89.4 88.1  PLT 300 256 215 216 221 256   Basic Metabolic Panel: Recent Labs  Lab 10/15/17 1515 10/16/17 0709 10/16/17 0856 10/17/17 0331 10/18/17 0403  NA 141 141 139 136 135  K 4.7 4.7 4.2 3.5 3.7  CL 103 104 102 96* 97*  CO2 23 24 26 28 25   GLUCOSE 123* 99 101* 117* 106*  BUN 30* 37* 26* 15 22  CREATININE 4.45* 5.41* 3.83* 3.32* 4.63*  CALCIUM 8.9 8.8* 8.4* 8.5* 8.7*  MG  --  2.0  --   --   --   PHOS  --  6.0* 4.2  --   --    GFR: Estimated Creatinine Clearance: 7.3 mL/min (A) (by C-G formula based on SCr of 4.63 mg/dL (H)). Liver Function Tests: Recent Labs  Lab 10/15/17 0935 10/15/17 1515 10/16/17 0856  AST 35 26  --   ALT 18 16  --   ALKPHOS 94 95  --   BILITOT 0.6 0.6  --   PROT 8.4* 7.7  --   ALBUMIN 2.7* 2.6* 2.5*   Recent Labs  Lab 10/15/17 0935  LIPASE 45   No results for input(s): AMMONIA in the last 168 hours. Coagulation Profile: Recent Labs  Lab 10/15/17 1515  INR 1.74   Cardiac Enzymes: Recent Labs  Lab 10/15/17 1515 10/16/17 0022 10/16/17 0709  TROPONINI 1.30* 1.65* 1.33*   BNP (last 3 results) No results for input(s): PROBNP in the last 8760 hours. HbA1C: No results for input(s): HGBA1C in the last 72 hours. CBG: Recent Labs  Lab 10/17/17 1523 10/17/17 1918 10/17/17 2338  10/18/17 0341 10/18/17 0738  GLUCAP 145* 77 103* 100* 89   Lipid Profile: No results for input(s): CHOL, HDL, LDLCALC, TRIG, CHOLHDL, LDLDIRECT in the last 72 hours. Thyroid Function Tests: No results for input(s): TSH, T4TOTAL, FREET4, T3FREE, THYROIDAB in the last 72 hours. Anemia Panel: No results for input(s): VITAMINB12, FOLATE, FERRITIN, TIBC, IRON, RETICCTPCT in the last 72 hours.    Radiology Studies: I have reviewed all of the imaging during this hospital visit personally     Scheduled Meds: . arformoterol  15 mcg Nebulization BID  . aspirin EC  81 mg Oral Daily  . budesonide (PULMICORT) nebulizer solution  0.5 mg Nebulization BID  . chlorhexidine  15 mL Mouth Rinse BID  . Chlorhexidine Gluconate Cloth  6 each Topical Q0600  . darbepoetin (ARANESP) injection - DIALYSIS  200 mcg  Intravenous Q Sat-HD  . diphenhydrAMINE  25 mg Oral Once  . heparin  5,000 Units Subcutaneous Q8H  . insulin aspart  0-9 Units Subcutaneous Q4H  . mouth rinse  15 mL Mouth Rinse q12n4p  . sodium chloride flush  3 mL Intravenous Q12H   Continuous Infusions: . sodium chloride 10 mL/hr at 10/17/17 0600  . sodium chloride Stopped (10/17/17 2100)  . meropenem (MERREM) IV Stopped (10/17/17 1748)  . vancomycin 500 mg (10/16/17 1625)     LOS: 3 days        Mauricio Annett Gulaaniel Arrien, MD [facility] Pager [phone]

## 2017-10-18 NOTE — Progress Notes (Signed)
Consultation Note Date: 10/18/2017   Patient Name: Nicole Cox  DOB: [birth year]  MRN: [id number]  Age / Sex: 84 y.o., female  PCP: Geraldo Docker, MD Referring Physician: Tawni Millers  Reason for Consultation: Establishing goals of care and Psychosocial/spiritual support  HPI/Patient Profile: 84 y.o. female  with past medical history of ESRD, atrial fibrillation, CVA, CHF, DM, COPD who was admitted on 10/15/2017 with aspiration pneumonia, afib, and encephalopathy. The patient experiences severe pain in her great toe due to dry gangrene.  She has been evaluated by speech who determined her cognitive deficits make her a high risk for aspiration.  She developed rapid afib in hemodialysis on 8/4 and was unable to finish her treatment.  An additional hemodialysis session is scheduled for today.  Clinical Assessment and Goals of Care: I met briefly with the patient who is sleeping peacefully.  She wakes to tell me she is having pain in her foot.  She states that her daughter Nicole Cox helps her make health care decisions and that she does not mind doing hemodialysis.   I have reviewed medical records including EPIC notes, labs and imaging, received report from bedside RN, assessed the patient and then talked on the phone with [name]  to discuss diagnosis prognosis, [name], EOL wishes, disposition and options. 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  She has HCPOA paperwork.  Nicole Cox is actually her great great Aunt.  She adopted her three nieces as babies.  Nicole Cox is the [employer] of the family.  We discussed potential aspiration pneumonia, frailty and the likelihood that at some point in the future Nicole Cox will not be able to tolerate hemodialysis.   Hemodialysis is hard on the heart and [name] heart went into rapid Afib with her last HD treatment causing it to be cut short.  Nicole Cox commented "I  thought It would be her COPD that would take her out"  I explained that with heart failure, kidney failure and COPD, Ms Nicole Cox has a lot of reasons to be short of breath.   We discussed code status and decided to meet tomorrow to complete a MOST form.    Nicole Cox (another one of [name]'s daughters) came into the room during my consultation.  Nicole Cox talked about her own battle with heart failure and her ICD placement.   I talked thru Ms. [name] with [name] as well.    Nicole Cox asked questions about Hospice and prognosis.  All questions were answered.      Primary Decision Maker:  NEXT OF KIN  Nicole Cox    SUMMARY OF RECOMMENDATIONS    Follow up Palliative meeting on 8/6 at 1:00 pm to complete a MOST form.  Code Status/Advance Care Planning:  Partial - no CPR, no shock.   Symptom Management:   Applied fentanyl patch for basal coverage.  Additional Recommendations (Limitations, Scope, Preferences):  Full Scope Treatment  Palliative Prophylaxis:   Aspiration   Prognosis:   Difficult to determine.  Less than 6 months due to  frailty and decline.  Developing heart arrhythmia's requiring the termination of individual HD sessions, difficulty with fluid overload, aspiration pneumonia    Discharge Planning: [name] for rehab with Palliative care service follow-up      Primary Diagnoses: Present on Admission: . Sepsis (Home)   I have reviewed the medical record, interviewed the patient and family, and examined the patient. The following aspects are pertinent.  Past Medical History:  Diagnosis Date  . CHF (congestive heart failure) ([place])   . COPD (chronic obstructive pulmonary disease) ([place])   . Coronary artery disease   . Diabetes mellitus without complication ([place])   . Heart disease   . Hypertension   . Renal disorder   . Stroke [facility])    Social History   Socioeconomic History  . Marital status: Widowed    Spouse name: Not on file    . Number of children: Not on file  . Years of education: Not on file  . Highest education level: Not on file  Occupational History  . Not on file  Social Needs  . Financial resource strain: Not on file  . Food insecurity:    Worry: Not on file    Inability: Not on file  . Transportation needs:    Medical: Not on file    Non-medical: Not on file  Tobacco Use  . Smoking status: Former Research scientist (life sciences)  . Smokeless tobacco: Never Used  Substance and Sexual Activity  . Alcohol use: No  . Drug use: No  . Sexual activity: Not on file  Lifestyle  . Physical activity:    Days per week: Not on file    Minutes per session: Not on file  . Stress: Not on file  Relationships  . Social connections:    Talks on phone: Not on file    Gets together: Not on file    Attends religious service: Not on file    Active member of club or organization: Not on file    Attends meetings of clubs or organizations: Not on file    Relationship status: Not on file  Other Topics Concern  . Not on file  Social History Narrative  . Not on file   Family History  Problem Relation Age of Onset  . Heart disease Mother   . Hypertension Mother   . Heart disease Father   . Hypertension Father   . Heart disease Sister   . Diabetes Sister   . Stroke Sister   . Kidney disease Sister   . Hypertension Sister   . Heart disease Sister   . Kidney disease Sister   . Hypertension Sister   . Kidney disease Daughter   . Hypertension Daughter    Scheduled Meds: . arformoterol  15 mcg Nebulization BID  . aspirin EC  81 mg Oral Daily  . budesonide (PULMICORT) nebulizer solution  0.5 mg Nebulization BID  . chlorhexidine  15 mL Mouth Rinse BID  . Chlorhexidine Gluconate Cloth  6 each Topical Q0600  . darbepoetin (ARANESP) injection - DIALYSIS  200 mcg Intravenous Q Sat-HD  . diphenhydrAMINE  25 mg Oral Once  . heparin  5,000 Units Subcutaneous Q8H  . insulin aspart  0-9 Units Subcutaneous Q4H  . mouth rinse  15 mL  Mouth Rinse q12n4p  . sodium chloride flush  3 mL Intravenous Q12H   Continuous Infusions: . sodium chloride 10 mL/hr at 10/17/17 0600  . sodium chloride Stopped (10/17/17 2100)  . meropenem (MERREM) IV Stopped (10/17/17  1748)   PRN Meds:.sodium chloride, sodium chloride, acetaminophen, fentaNYL (SUBLIMAZE) injection, RESOURCE THICKENUP CLEAR, sodium chloride flush No Known Allergies Review of Systems patient lethargic  Physical Exam  Elderly female, lethargic, NAD.  Awake but drifts off to sleep easily. CV irreg rhythm resp soft, no distress Abdomen soft, nt,nd  Vital Signs: BP (!) 143/51 (BP Location: Right Arm)   Pulse 79   Temp 98.6 F (37 C) (Oral)   Resp 18   Ht _0  (1.575 m)   Wt 58.7 kg (129 lb 6.6 oz)   SpO2 100%   BMI 23.67 kg/m  Pain Scale: 0-10 POSS *See Group Information*: 1-Acceptable,Awake and alert Pain Score: 0-No pain   SpO2: SpO2: 100 % O2 Device:SpO2: 100 % O2 Flow Rate: .O2 Flow Rate (L/min): 2 L/min  IO: Intake/output summary:   Intake/Output Summary (Last 24 hours) at 10/18/2017 1048 Last data filed at 10/18/2017 0920 Gross per 24 hour  Intake 1340.3 ml  Output 1 ml  Net 1339.3 ml    LBM: Last BM Date: 10/17/17 Baseline Weight: Weight: 59.4 kg (130 lb 15.3 oz) Most recent weight: Weight: 58.7 kg (129 lb 6.6 oz)     Palliative Assessment/Data:  30%     Time In: 10:00 Time Out: 11:10 Time Total: 70 min. Greater than 50%  of this time was spent counseling and coordinating care related to the above assessment and plan.  Signed by: Florentina Jenny, PA-C Palliative Medicine Pager: [phone]  Please contact Palliative Medicine Team phone at [phone] for questions and concerns.  For individual provider: See Shea Evans

## 2017-10-18 NOTE — Progress Notes (Addendum)
Called report to 5 m to Kami RN - Pt going straight from Dialysis to 5M16 Dialysis will call report to RN on 5 MW Family aware of transfer.

## 2017-10-18 NOTE — Progress Notes (Addendum)
Pharmacy Antibiotic Note  Nicole Cox is a 84 y.o. female admitted on 10/15/2017 with acinetobacterbacteremia and sepsis.  Pharmacy has been consulted for meropenem dosing. Pt was supposed to continue imipenem after hospital discharge 7/19 but refused to have IV access at SNF. Recently this admission, pt was started on vancomycin which discontinued today (8/5).   Repeat culture this admission is ngtd.  WBC trending upwards slightly, ANC trending down, vitals wnl.  Last HD session on 8/3. Pt tolerated full 4-hour session well at goal blood flow rate of 400 ml/min.  Plan: This patient's current antibiotics will be continued without adjustments. Monitor their clinical status and toleration of HD. Follow-up on length of therapy.  Height: 5\' 2"  (157.5 cm) Weight: 129 lb 6.6 oz (58.7 kg) IBW/kg (Calculated) : 50.1  Temp (24hrs), Avg:98.2 F (36.8 C), Min:97.7 F (36.5 C), Max:98.6 F (37 C)  Recent Labs  Lab 10/15/17 0949 10/15/17 1515 10/15/17 1835 10/16/17 0709 10/16/17 0839 10/16/17 0856 10/17/17 0331 10/17/17 1021 10/18/17 0403  WBC  --  15.5*  --  12.4* 11.3*  --   --  12.1* 13.4*  CREATININE  --  4.45*  --  5.41*  --  3.83* 3.32*  --  4.63*  LATICACIDVEN 3.78* 1.9 1.9  --   --   --   --   --   --     Estimated Creatinine Clearance: 7.3 mL/min (A) (by C-G formula based on SCr of 4.63 mg/dL (H)).    No Known Allergies  Antimicrobials this admission: Meropenem 8/2 >>  Vancomycin 8/3 >> 8/5  Dose adjustments this admission: N/A  Microbiology results: 8/2 BCx: ngtd 8/2 MRSA PCR: neg  Thank you for allowing pharmacy to be a part of this patient's care.  Tama Headingshristine   Ko, PharmD Candidate 10/18/2017 10:11 AM   I discussed / reviewed the pharmacy note by Tama Headingshristine Ko, PharmD Candidate and I agree with the student's findings and plans as documented.  Link SnufferJessica Alle Difabio, PharmD, BCPS, BCCCP Clinical Pharmacist Clinical phone 10/18/2017 until 3:30PM - #[phone] Please [facility] to  [facility] and [facility] for pharmacy numbers 10/18/2017, 2:46 PM

## 2017-10-19 DIAGNOSIS — Z66 Do not resuscitate: Secondary | ICD-10-CM

## 2017-10-19 DIAGNOSIS — Z992 Dependence on renal dialysis: Secondary | ICD-10-CM

## 2017-10-19 DIAGNOSIS — N186 End stage renal disease: Secondary | ICD-10-CM

## 2017-10-19 DIAGNOSIS — R4182 Altered mental status, unspecified: Secondary | ICD-10-CM

## 2017-10-19 DIAGNOSIS — I70261 Atherosclerosis of native arteries of extremities with gangrene, right leg: Secondary | ICD-10-CM

## 2017-10-19 DIAGNOSIS — R1312 Dysphagia, oropharyngeal phase: Secondary | ICD-10-CM

## 2017-10-19 LAB — CBC WITH DIFFERENTIAL/PLATELET
Abs Immature Granulocytes: 0.3 10*3/uL — ABNORMAL HIGH (ref 0.0–0.1)
Basophils Absolute: 0.1 10*3/uL (ref 0.0–0.1)
Basophils Relative: 1 %
Eosinophils Absolute: 0.3 10*3/uL (ref 0.0–0.7)
Eosinophils Relative: 2 %
HEMATOCRIT: 30.8 % — AB (ref 36.0–46.0)
HEMOGLOBIN: 9.1 g/dL — AB (ref 12.0–15.0)
IMMATURE GRANULOCYTES: 2 %
LYMPHS PCT: 11 %
Lymphs Abs: 1.7 10*3/uL (ref 0.7–4.0)
MCH: 26 pg (ref 26.0–34.0)
MCHC: 29.5 g/dL — ABNORMAL LOW (ref 30.0–36.0)
MCV: 88 fL (ref 78.0–100.0)
Monocytes Absolute: 1.5 10*3/uL — ABNORMAL HIGH (ref 0.1–1.0)
Monocytes Relative: 9 %
NEUTROS PCT: 75 %
Neutro Abs: 12.2 10*3/uL — ABNORMAL HIGH (ref 1.7–7.7)
Platelets: 261 10*3/uL (ref 150–400)
RBC: 3.5 MIL/uL — ABNORMAL LOW (ref 3.87–5.11)
RDW: 20.2 % — ABNORMAL HIGH (ref 11.5–15.5)
WBC: 16.2 10*3/uL — ABNORMAL HIGH (ref 4.0–10.5)

## 2017-10-19 LAB — GLUCOSE, CAPILLARY
GLUCOSE-CAPILLARY: 100 mg/dL — AB (ref 70–99)
GLUCOSE-CAPILLARY: 120 mg/dL — AB (ref 70–99)
GLUCOSE-CAPILLARY: 128 mg/dL — AB (ref 70–99)
Glucose-Capillary: 103 mg/dL — ABNORMAL HIGH (ref 70–99)
Glucose-Capillary: 119 mg/dL — ABNORMAL HIGH (ref 70–99)
Glucose-Capillary: 176 mg/dL — ABNORMAL HIGH (ref 70–99)
Glucose-Capillary: 79 mg/dL (ref 70–99)
Glucose-Capillary: 89 mg/dL (ref 70–99)

## 2017-10-19 LAB — BASIC METABOLIC PANEL
ANION GAP: 13 (ref 5–15)
BUN: 12 mg/dL (ref 8–23)
CO2: 28 mmol/L (ref 22–32)
Calcium: 8.7 mg/dL — ABNORMAL LOW (ref 8.9–10.3)
Chloride: 96 mmol/L — ABNORMAL LOW (ref 98–111)
Creatinine, Ser: 3.47 mg/dL — ABNORMAL HIGH (ref 0.44–1.00)
GFR calc non Af Amer: 11 mL/min — ABNORMAL LOW (ref >60)
GFR, EST AFRICAN AMERICAN: 13 mL/min — AB (ref >60)
Glucose, Bld: 89 mg/dL (ref 70–99)
POTASSIUM: 3.8 mmol/L (ref 3.5–5.1)
Sodium: 137 mmol/L (ref 135–145)

## 2017-10-19 LAB — ALBUMIN: Albumin: 2.4 g/dL — ABNORMAL LOW (ref 3.5–5.0)

## 2017-10-19 LAB — PHOSPHORUS: PHOSPHORUS: 3.6 mg/dL (ref 2.5–4.6)

## 2017-10-19 MED ORDER — ATORVASTATIN CALCIUM 20 MG PO TABS
20.0000 mg | ORAL_TABLET | Freq: Every day | ORAL | Status: DC
  Administered 2017-10-19 – 2017-10-26 (×6): 20 mg via ORAL
  Filled 2017-10-19 (×6): qty 1

## 2017-10-19 MED ORDER — FENTANYL CITRATE (PF) 100 MCG/2ML IJ SOLN
INTRAMUSCULAR | Status: AC
  Filled 2017-10-19: qty 2

## 2017-10-19 NOTE — Progress Notes (Addendum)
[facility] Progress Note   Subjective: Moaning and groaning. Says she has called for help all night but no one came. Says her toe hurts. Oriented to person and place.     Objective Vitals:   10/19/17 0424 10/19/17 0846 10/19/17 0850 10/19/17 0922  BP: (!) 155/55   (!) 148/56  Pulse: (!) 103 91  92  Resp: 20 18  18   Temp: 98.6 F (37 C)   98.2 F (36.8 C)  TempSrc: Oral   Oral  SpO2: 100% 100% 100% 100%  Weight:      Height:       Physical Exam General: Frail, elderly female in moderate distress over toe pain.  Heart: S1,S2 2/6 systolic M. No JVD.  Lungs: Decreased in bases otherwise CTAB Abdomen: active BS Extremities: No LE edema, dry gangrene to tips of great toes bilaterally, no drainage.  Dialysis Access: LUA AVG + bruit   Additional Objective Labs: Basic Metabolic Panel: Recent Labs  Lab 10/16/17 0709 10/16/17 0856 10/17/17 0331 10/18/17 0403 10/19/17 0414  NA 141 139 136 135 137  K 4.7 4.2 3.5 3.7 3.8  CL 104 102 96* 97* 96*  CO2 24 26 28 25 28   GLUCOSE 99 101* 117* 106* 89  BUN 37* 26* 15 22 12   CREATININE 5.41* 3.83* 3.32* 4.63* 3.47*  CALCIUM 8.8* 8.4* 8.5* 8.7* 8.7*  PHOS 6.0* 4.2  --   --   --    Liver Function Tests: Recent Labs  Lab 10/15/17 0935 10/15/17 1515 10/16/17 0856  AST 35 26  --   ALT 18 16  --   ALKPHOS 94 95  --   BILITOT 0.6 0.6  --   PROT 8.4* 7.7  --   ALBUMIN 2.7* 2.6* 2.5*   Recent Labs  Lab 10/15/17 0935  LIPASE 45   CBC: Recent Labs  Lab 10/15/17 1515 10/16/17 0709 10/16/17 0839 10/17/17 1021 10/18/17 0403 10/19/17 0414  WBC 15.5* 12.4* 11.3* 12.1* 13.4* 16.2*  NEUTROABS 11.4*  --   --   --  10.1* 12.2*  HGB 8.7* 7.0* 7.0* 8.4* 8.8* 9.1*  HCT 30.7* 24.0* 24.2* 27.8* 29.7* 30.8*  MCV 93.0 93.0 92.7 89.4 88.1 88.0  PLT 256 215 216 221 256 261   Blood Culture    Component Value Date/Time   SDES BLOOD RIGHT HAND 10/15/2017 1515   SPECREQUEST  10/15/2017 1515    BOTTLES DRAWN AEROBIC  ONLY Blood Culture results may not be optimal due to an inadequate volume of blood received in culture bottles   CULT  10/15/2017 1515    NO GROWTH 3 DAYS Performed at [facility], [address] [place], [place] [address]    REPTSTATUS PENDING 10/15/2017 1515    Cardiac Enzymes: Recent Labs  Lab 10/15/17 1515 10/16/17 0022 10/16/17 0709  TROPONINI 1.30* 1.65* 1.33*   CBG: Recent Labs  Lab 10/18/17 1120 10/18/17 1959 10/19/17 0017 10/19/17 0421 10/19/17 0749  GLUCAP 94 82 103* 89 79   Iron Studies: No results for input(s): IRON, TIBC, TRANSFERRIN, FERRITIN in the last 72 hours. @lablastinr3 @ Studies/Results: No results found. Medications: . sodium chloride 10 mL/hr at 10/17/17 0600  . sodium chloride Stopped (10/17/17 2100)  . meropenem (MERREM) IV Stopped (10/17/17 1748)   . arformoterol  15 mcg Nebulization BID  . aspirin EC  81 mg Oral Daily  . budesonide (PULMICORT) nebulizer solution  0.5 mg Nebulization BID  . chlorhexidine  15 mL Mouth Rinse BID  .  darbepoetin (ARANESP) injection - DIALYSIS  200 mcg Intravenous Q Sat-HD  . diphenhydrAMINE  25 mg Oral Once  . fentaNYL  25 mcg Transdermal Q72H  . heparin  5,000 Units Subcutaneous Q8H  . insulin aspart  0-9 Units Subcutaneous Q4H  . mouth rinse  15 mL Mouth Rinse q12n4p  . sodium chloride flush  3 mL Intravenous Q12H     Dialysis: [facility]/ Dr Louis Meckel TTS 3h  121lbs   2/2.5   Hep 2000 then 250 / hr  AVG  - mircera 150 q 2 weeks- last given 7/25; last hgb at center 8.7 - hect 1 mcg IV q tx   Summary:84 year old chronically ill female with recent sepsis- now returning 2 weeks later with dec MS, elevated WBC and lactate concerning for sepsis  Assessment/Plan: 1AMS/ Asp PNA/ sepsis - per CCM, on Meropenem. BC NG X 3 days. WBC 16.2 continues to rise. Afebrile past 24 hours.  2 ESRD:normally TTS as OP via AVG. Extra HD 10/18/17 for volume overload,  Reg HD today.  3 Hypertension:normally  very hypertensive on multiple meds. BP appears to be better controlled today. HD 08/05 pre wt 58.7 Net UF 2.5. Post wt 56.2 kg. Still slightly above OP EDW. HD today on schedule.  4  Anemia of ESRD:HGB 7.0 on adm. Rec'd 1 unit PRBCs 10/16/17. HGB 9.1. Give Aranesp 100 mcg IV in HD today. Hold Fe load D/T sepsis.  5. Metabolic Bone Disease:Renal function panel added to today's labs. Ca 8.7 C Ca 9.9.  6. Decreased MS- possibly due to sepsis vs reported new neurontin, improving 7. Afib- SR on monitor. Amiodarone DC'd. No anticoagulation.  8. R Great Toe Necrosis- major issue for her- some dry gangrene to tip of bilat big toes, per primary-getting LE dopplers.   Rita H. Brown NP-C 10/19/2017, 10:09 AM  [facility] [phone]  Pt seen, examined and agree w A/P as above. Vinson Moselle MD [employer] pager [phone]   10/19/2017, 2:57 PM

## 2017-10-19 NOTE — Consult Note (Signed)
Hospital Consult    Reason for Consult:  Right great toe gangrene Referring Physician:  Dr. Ella JubileeArrien MRN #:  [id number]  History of Present Illness: This is a 84 y.o. female with history of end-stage renal disease.  She was admitted with altered mental status.  She is on dialysis via left upper arm AV fistula.  She was walking prior to admission to outpatient rehab.  She now has pain in her right great toe with ischemic changes.  She is undergone.  Noninvasive testing of her bilateral lower extremities.  She is a former smoker.  She is diabetic.  Past Medical History:  Diagnosis Date  . CHF (congestive heart failure) (HCC)   . COPD (chronic obstructive pulmonary disease) (HCC)   . Coronary artery disease   . Diabetes mellitus without complication (HCC)   . Heart disease   . Hypertension   . Renal disorder   . Stroke [facility](HCC)     Past Surgical History:  Procedure Laterality Date  . ABDOMINAL HYSTERECTOMY    . CHOLECYSTECTOMY    . TEE WITHOUT CARDIOVERSION N/A 08/02/2017   Procedure: TRANSESOPHAGEAL ECHOCARDIOGRAM (TEE);  Surgeon: Lars MassonNelson, Katarina H, MD;  [facility] END[facility];  Service: Cardiovascular;  Laterality: N/A;  . TONSILLECTOMY      No Known Allergies  Prior to Admission medications   Medication Sig Start Date End Date Taking? Authorizing Provider  acetaminophen (TYLENOL) 325 MG tablet Take 650 mg by mouth every 6 (six) hours as needed.   Yes [provider]  albuterol (PROVENTIL HFA;VENTOLIN HFA) 108 (90 Base) MCG/ACT inhaler Inhale 2 puffs into the lungs every 6 (six) hours as needed for wheezing or shortness of breath. 08/17/15  Yes Mikhail, Nita SellsMaryann, DO  aspirin EC 81 MG tablet Take 81 mg by mouth daily.   Yes [provider]  cloNIDine (CATAPRES) 0.1 MG tablet Take 0.1 mg by mouth every 12 (twelve) hours.  10/10/17  Yes [provider]  diphenhydrAMINE (BENADRYL) 25 mg capsule Take 25 mg by mouth every morning.   Yes [provider]    doxercalciferol (HECTOROL) 2.5 MCG capsule Take 1 capsule (2.5 mcg total) by mouth Every Tuesday,Thursday,and Saturday with dialysis. 08/03/17  Yes Osvaldo ShipperKrishnan, Gokul, MD  fluticasone furoate-vilanterol (BREO ELLIPTA) 100-25 MCG/INH AEPB Inhale 1 puff into the lungs daily. 11/10/15  Yes [provider]  gabapentin (NEURONTIN) 100 MG capsule Take 100 mg by mouth 3 (three) times daily. 10/10/17  Yes [provider]  insulin detemir (LEVEMIR) 100 UNIT/ML injection Inject 5 Units into the skin daily.   Yes [provider]  isosorbide mononitrate (IMDUR) 30 MG 24 hr tablet Take 30 mg by mouth every evening. 03/21/17  Yes [provider]  lidocaine-prilocaine (EMLA) cream Apply 1 application topically every other day. 10/21/15  Yes [provider]  lisinopril (PRINIVIL,ZESTRIL) 40 MG tablet Take 40 mg by mouth every evening. 12/02/15  Yes [provider]  Melatonin 3 MG TABS Take 3 mg by mouth at bedtime.   Yes [provider]  metoprolol tartrate (LOPRESSOR) 25 MG tablet Take 0.5 tablets (12.5 mg total) by mouth 2 (two) times daily. Patient taking differently: Take 12.5 mg by mouth daily.  06/10/17  Yes Regalado, Belkys A, MD  NIFEdipine (PROCARDIA XL/ADALAT-CC) 60 MG 24 hr tablet Take 60 mg by mouth 2 (two) times daily. 12/03/15  Yes [provider]  nitroGLYCERIN (NITROSTAT) 0.4 MG SL tablet Place 0.4 mg under the tongue See admin instructions. Mat repeat every 5 minutes. If  third tab needed call 911 06/23/17  Yes [provider]  pravastatin (PRAVACHOL) 40 MG tablet Take 40 mg by mouth daily.   Yes [provider]  umeclidinium bromide (INCRUSE ELLIPTA) 62.5 MCG/INH AEPB Inhale 1 puff into the lungs daily.   Yes [provider]  cloNIDine (CATAPRES - DOSED IN MG/24 HR) 0.2 mg/24hr patch Place 1 patch (0.2 mg total) onto the skin every 7 (seven) days. Patient not taking: Reported on 10/15/2017 08/04/17   Osvaldo Shipper,  MD  Nutritional Supplements (FEEDING SUPPLEMENT, NEPRO CARB STEADY,) LIQD Take 237 mLs by mouth 2 (two) times daily between meals. Patient not taking: Reported on 10/15/2017 06/09/17   Alba Cory, MD    Social History   Socioeconomic History  . Marital status: Widowed    Spouse name: Not on file  . Number of children: Not on file  . Years of education: Not on file  . Highest education level: Not on file  Occupational History  . Not on file  Social Needs  . Financial resource strain: Not on file  . Food insecurity:    Worry: Not on file    Inability: Not on file  . Transportation needs:    Medical: Not on file    Non-medical: Not on file  Tobacco Use  . Smoking status: Former Games developer  . Smokeless tobacco: Never Used  Substance and Sexual Activity  . Alcohol use: No  . Drug use: No  . Sexual activity: Not on file  Lifestyle  . Physical activity:    Days per week: Not on file    Minutes per session: Not on file  . Stress: Not on file  Relationships  . Social connections:    Talks on phone: Not on file    Gets together: Not on file    Attends religious service: Not on file    Active member of club or organization: Not on file    Attends meetings of clubs or organizations: Not on file    Relationship status: Not on file  . Intimate partner violence:    Fear of current or ex partner: Not on file    Emotionally abused: Not on file    Physically abused: Not on file    Forced sexual activity: Not on file  Other Topics Concern  . Not on file  Social History Narrative  . Not on file    Family History  Problem Relation Age of Onset  . Heart disease Mother   . Hypertension Mother   . Heart disease Father   . Hypertension Father   . Heart disease Sister   . Diabetes Sister   . Stroke Sister   . Kidney disease Sister   . Hypertension Sister   . Heart disease Sister   . Kidney disease Sister   . Hypertension Sister   . Kidney disease Daughter   . Hypertension  Daughter     ROS: [x]  Positive   [ ]  Negative   [ ]  All sytems reviewed and are negative Cardiovascular: []  chest pain/pressure []  palpitations []  SOB lying flat []  DOE []  pain in legs while walking [x]  pain in legs at rest []  pain in legs at night [x]  non-healing ulcers []  hx of DVT []  swelling in legs  Pulmonary: []  productive cough []  asthma/wheezing []  home O2  Neurologic: []  weakness in []  arms []  legs []  numbness in []  arms []  legs []  hx of CVA []  mini stroke [] difficulty speaking or slurred  speech []  temporary loss of vision in one eye []  dizziness  Hematologic: []  hx of cancer []  bleeding problems []  problems with blood clotting easily  Endocrine:   [x]  diabetes []  thyroid disease  GI []  vomiting blood []  blood in stool  GU: [x]  CKD/renal failure []  HD--[]  M/W/F or []  T/T/S []  burning with urination []  blood in urine  Psychiatric: []  anxiety []  depression  Musculoskeletal: []  arthritis []  joint pain  Integumentary: []  rashes [x]  ulcers  Constitutional: []  fever []  chills   Physical Examination  Vitals:   10/19/17 0850 10/19/17 0922  BP:  (!) 148/56  Pulse:  92  Resp:  18  Temp:  98.2 F (36.8 C)  SpO2: 100% 100%   Body mass index is 22.66 kg/m.  General:  WDWN in NAD HENT: WNL, normocephalic Pulmonary: normal non-labored breathing Cardiac: Palpable femoral popliteal pulses bilaterally Abdomen: soft, NT/ND, no masses Extremities: She has changes consistent with tip gangrene of her right great toe.  There is dark discoloration of her left great toe without gangrene Musculoskeletal: no muscle wasting or atrophy  Neurologic: A&O X 3; Appropriate Affect ; SENSATION: normal; MOTOR FUNCTION:  moving all extremities equally. Speech is fluent/normal  CBC    Component Value Date/Time   WBC 16.2 (H) 10/19/2017 0414   RBC 3.50 (L) 10/19/2017 0414   HGB 9.1 (L) 10/19/2017 0414   HCT 30.8 (L) 10/19/2017 0414   PLT 261 10/19/2017  0414   MCV 88.0 10/19/2017 0414   MCH 26.0 10/19/2017 0414   MCHC 29.5 (L) 10/19/2017 0414   RDW 20.2 (H) 10/19/2017 0414   LYMPHSABS 1.7 10/19/2017 0414   MONOABS 1.5 (H) 10/19/2017 0414   EOSABS 0.3 10/19/2017 0414   BASOSABS 0.1 10/19/2017 0414    BMET    Component Value Date/Time   NA 137 10/19/2017 0414   K 3.8 10/19/2017 0414   CL 96 (L) 10/19/2017 0414   CO2 28 10/19/2017 0414   GLUCOSE 89 10/19/2017 0414   BUN 12 10/19/2017 0414   CREATININE 3.47 (H) 10/19/2017 0414   CALCIUM 8.7 (L) 10/19/2017 0414   GFRNONAA 11 (L) 10/19/2017 0414   GFRAA 13 (L) 10/19/2017 0414    COAGS: Lab Results  Component Value Date   INR 1.74 10/15/2017   INR 1.09 07/27/2017     Non-Invasive Vascular Imaging:     ABIs are noncompressible bilateral with toe pressures that are nearly 0.  ASSESSMENT/PLAN: This is a 84 y.o. female with evidence of peripheral vascular disease and early gangrenous changes to her right great toe.  She was ambulatory until 2 months ago but has been in significant decline.  I discussed with her daughter that she is at risk for limb loss from this but hopefully we can improve her blood flow.  We will plan for aortogram with bilateral lower extremity runoff possible intervention on the right this Friday.  She will need to be n.p.o. past midnight on Thursday.  Olsen Mccutchan C. Randie Heinz, MD [facility] Office: [phone] Pager: [phone]

## 2017-10-19 NOTE — Progress Notes (Signed)
PROGRESS NOTE    Nicole DecantLizzie Cox  ZOX:[id number] DOB: [birth year] DOA: 10/15/2017 PCP: [name], J Stewart, MD    Brief Narrative:  84 yo female who presented with altered mental status. She does have the significant medical history of ESRD on HD, HTN, CVA, diastolic heart failure, CAD and COPD. Recent hospitalization forAcinetobacter pneumonia. Discharged (07/20) on Imipenem that she did not complete due to declined IV access at the SNF.She was noted to have progressive weakness and dyspnea prior to hospitalization, on her initial physical exam blood pressure 118/44, heart rate 88, temperature 97.9, oxygen saturation 100%, respiratory rate 15. Patient was somnolent, lungs with decreased breath sounds bilateral, bilateral rales, heart S1-S2 present rhythmic, positive 3/6 murmur at the left sternal border, abdomen soft, protuberant, no lower extremity edema. Sodium 141, potassium 4.7, chloride 103, bicarbonate 23, glucose 123, BUN 30, creatinine 4.4, troponin 1.3, white count 15.5, hemoglobin 8.7, hematocrit 30.7, platelets 256. Head CT without atrophy, no acute changes. Chest x-ray with right lower lobe alveolar/interstitial infiltrate. EKG sinus rhythm, PACs. Normal axis, normal intervals.  Patient was admitted to hospital with working diagnosis of right lower lobe pneumonia   Assessment & Plan:   Active Problems:   Sepsis (HCC)   Altered mental status   ESRD on dialysis (HCC)   Persistent atrial fibrillation (HCC)   Dry gangrene (HCC)   1. Aspiration pneumonia (present on admission). Plan to complete IV meropenem for a total of 10 complete days. Continue aspiration precautions.  WBC up to 16, will follow cell count in am.   2. Acute metabolic encephalopathy. Today patient is able to respond to simple quesitions, will continue neuro checks per unit protocol. Tolerating po.   3. New onset atrial fibrillation. Heart rate 85 to 100 sinus, will continue telemetry monitoring, no anticoagulation  due to risk of bleeding.   4. ESRD on HDwith anemia of chronic renal disease. Patient will have HD today, continue to follow nephrology recommendations.    5. COPD with pulmonary hypertension No signs of acute exacerbation, contine arfomoterol and budesonide.   6. T2DM. Continue with glucose cover and monitoring with insulin sliding scale. Capillary glucose 103, 89, 79, 100, 119.   7. New right foot great toe necrosis. Positive stenosis on arterial dopplers, with reported 50 to 74% stenosis bilaterally. Consulted vascular surgery for evaluation. Will continue aspirin and will add atorvastatin.   DVT prophylaxis:scd Code Status:dnr Family Communication:no family at the bedside Disposition Plan/ discharge barriers:pending clinical improvement.   Consultants:    Procedures:    Antimicrobials:   Imipenem   Subjective: Patient continue to have right foot great toe pain, improved with analgesics, worse to touch, no nausea or vomiting, no dyspnea.   Objective: Vitals:   10/19/17 0424 10/19/17 0846 10/19/17 0850 10/19/17 0922  BP: (!) 155/55   (!) 148/56  Pulse: (!) 103 91  92  Resp: 20 18  18   Temp: 98.6 F (37 C)   98.2 F (36.8 C)  TempSrc: Oral   Oral  SpO2: 100% 100% 100% 100%  Weight:      Height:        Intake/Output Summary (Last 24 hours) at 10/19/2017 1139 Last data filed at 10/19/2017 0923 Gross per 24 hour  Intake 240 ml  Output 2500 ml  Net -2260 ml   Filed Weights   10/18/17 0500 10/18/17 1245 10/18/17 1701  Weight: 58.7 kg (129 lb 6.6 oz) 58.7 kg (129 lb 6.6 oz) 56.2 kg (123 lb 14.4 oz)  Examination:   General: Not in pain or dyspnea, deconditioned  Neurology: Awake and alert, non focal  E ENT: no pallor, no icterus, oral mucosa moist Cardiovascular: No JVD. S1-S2 present, rhythmic, no gallops, rubs, or murmurs. No lower extremity edema. Pulmonary: positive breath sounds bilaterally, adequate air movement, no wheezing,  rhonchi or rales. Gastrointestinal. Abdomen with no organomegaly, non tender, no rebound or guarding Skin. Right foot, great toe necrosis, tender to palpation, decreased pulses.  Musculoskeletal: no joint deformities       Data Reviewed: I have personally reviewed following labs and imaging studies  CBC: Recent Labs  Lab 10/15/17 0935 10/15/17 1515 10/16/17 0709 10/16/17 0839 10/17/17 1021 10/18/17 0403 10/19/17 0414  WBC 16.1* 15.5* 12.4* 11.3* 12.1* 13.4* 16.2*  NEUTROABS 13.0* 11.4*  --   --   --  10.1* 12.2*  HGB 8.9* 8.7* 7.0* 7.0* 8.4* 8.8* 9.1*  HCT 31.3* 30.7* 24.0* 24.2* 27.8* 29.7* 30.8*  MCV 92.6 93.0 93.0 92.7 89.4 88.1 88.0  PLT 300 256 215 216 221 256 261   Basic Metabolic Panel: Recent Labs  Lab 10/16/17 0709 10/16/17 0856 10/17/17 0331 10/18/17 0403 10/19/17 0414  NA 141 139 136 135 137  K 4.7 4.2 3.5 3.7 3.8  CL 104 102 96* 97* 96*  CO2 24 26 28 25 28   GLUCOSE 99 101* 117* 106* 89  BUN 37* 26* 15 22 12   CREATININE 5.41* 3.83* 3.32* 4.63* 3.47*  CALCIUM 8.8* 8.4* 8.5* 8.7* 8.7*  MG 2.0  --   --   --   --   PHOS 6.0* 4.2  --   --  3.6   GFR: Estimated Creatinine Clearance: 9.7 mL/min (A) (by C-G formula based on SCr of 3.47 mg/dL (H)). Liver Function Tests: Recent Labs  Lab 10/15/17 0935 10/15/17 1515 10/16/17 0856 10/19/17 0414  AST 35 26  --   --   ALT 18 16  --   --   ALKPHOS 94 95  --   --   BILITOT 0.6 0.6  --   --   PROT 8.4* 7.7  --   --   ALBUMIN 2.7* 2.6* 2.5* 2.4*   Recent Labs  Lab 10/15/17 0935  LIPASE 45   No results for input(s): AMMONIA in the last 168 hours. Coagulation Profile: Recent Labs  Lab 10/15/17 1515  INR 1.74   Cardiac Enzymes: Recent Labs  Lab 10/15/17 1515 10/16/17 0022 10/16/17 0709  TROPONINI 1.30* 1.65* 1.33*   BNP (last 3 results) No results for input(s): PROBNP in the last 8760 hours. HbA1C: No results for input(s): HGBA1C in the last 72 hours. CBG: Recent Labs  Lab 10/18/17 1959  10/19/17 0017 10/19/17 0421 10/19/17 0749 10/19/17 1127  GLUCAP 82 103* 89 79 100*   Lipid Profile: No results for input(s): CHOL, HDL, LDLCALC, TRIG, CHOLHDL, LDLDIRECT in the last 72 hours. Thyroid Function Tests: No results for input(s): TSH, T4TOTAL, FREET4, T3FREE, THYROIDAB in the last 72 hours. Anemia Panel: No results for input(s): VITAMINB12, FOLATE, FERRITIN, TIBC, IRON, RETICCTPCT in the last 72 hours.    Radiology Studies: I have reviewed all of the imaging during this hospital visit personally     Scheduled Meds: . arformoterol  15 mcg Nebulization BID  . aspirin EC  81 mg Oral Daily  . budesonide (PULMICORT) nebulizer solution  0.5 mg Nebulization BID  . chlorhexidine  15 mL Mouth Rinse BID  . darbepoetin (ARANESP) injection - DIALYSIS  200 mcg Intravenous Q Sat-HD  .  diphenhydrAMINE  25 mg Oral Once  . fentaNYL  25 mcg Transdermal Q72H  . heparin  5,000 Units Subcutaneous Q8H  . insulin aspart  0-9 Units Subcutaneous Q4H  . mouth rinse  15 mL Mouth Rinse q12n4p  . sodium chloride flush  3 mL Intravenous Q12H   Continuous Infusions: . sodium chloride 10 mL/hr at 10/17/17 0600  . sodium chloride Stopped (10/17/17 2100)  . meropenem (MERREM) IV Stopped (10/17/17 1748)     LOS: 4 days        Verona Hartshorn Annett Gula, MD [facility] Pager [phone]

## 2017-10-19 NOTE — H&P (View-Only) (Signed)
Hospital Consult    Reason for Consult:  Right great toe gangrene Referring Physician:  Dr. Arrien MRN #:  [id number]  History of Present Illness: This is a 84 y.o. female with history of end-stage renal disease.  She was admitted with altered mental status.  She is on dialysis via left upper arm AV fistula.  She was walking prior to admission to outpatient rehab.  She now has pain in her right great toe with ischemic changes.  She is undergone.  Noninvasive testing of her bilateral lower extremities.  She is a former smoker.  She is diabetic.  Past Medical History:  Diagnosis Date  . CHF (congestive heart failure) (HCC)   . COPD (chronic obstructive pulmonary disease) (HCC)   . Coronary artery disease   . Diabetes mellitus without complication (HCC)   . Heart disease   . Hypertension   . Renal disorder   . Stroke (HCC)     Past Surgical History:  Procedure Laterality Date  . ABDOMINAL HYSTERECTOMY    . CHOLECYSTECTOMY    . TEE WITHOUT CARDIOVERSION N/A 08/02/2017   Procedure: TRANSESOPHAGEAL ECHOCARDIOGRAM (TEE);  Surgeon: Nelson, Katarina H, MD;  Location: [facility];  Service: Cardiovascular;  Laterality: N/A;  . TONSILLECTOMY      No Known Allergies  Prior to Admission medications   Medication Sig Start Date End Date Taking? Authorizing Provider  acetaminophen (TYLENOL) 325 MG tablet Take 650 mg by mouth every 6 (six) hours as needed.   Yes [provider]  albuterol (PROVENTIL HFA;VENTOLIN HFA) 108 (90 Base) MCG/ACT inhaler Inhale 2 puffs into the lungs every 6 (six) hours as needed for wheezing or shortness of breath. 08/17/15  Yes Mikhail, Maryann, DO  aspirin EC 81 MG tablet Take 81 mg by mouth daily.   Yes [provider]  cloNIDine (CATAPRES) 0.1 MG tablet Take 0.1 mg by mouth every 12 (twelve) hours.  10/10/17  Yes [provider]  diphenhydrAMINE (BENADRYL) 25 mg capsule Take 25 mg by mouth every morning.   Yes [provider]    doxercalciferol (HECTOROL) 2.5 MCG capsule Take 1 capsule (2.5 mcg total) by mouth Every Tuesday,Thursday,and Saturday with dialysis. 08/03/17  Yes Krishnan, Gokul, MD  fluticasone furoate-vilanterol (BREO ELLIPTA) 100-25 MCG/INH AEPB Inhale 1 puff into the lungs daily. 11/10/15  Yes [provider]  gabapentin (NEURONTIN) 100 MG capsule Take 100 mg by mouth 3 (three) times daily. 10/10/17  Yes [provider]  insulin detemir (LEVEMIR) 100 UNIT/ML injection Inject 5 Units into the skin daily.   Yes [provider]  isosorbide mononitrate (IMDUR) 30 MG 24 hr tablet Take 30 mg by mouth every evening. 03/21/17  Yes [provider]  lidocaine-prilocaine (EMLA) cream Apply 1 application topically every other day. 10/21/15  Yes [provider]  lisinopril (PRINIVIL,ZESTRIL) 40 MG tablet Take 40 mg by mouth every evening. 12/02/15  Yes [provider]  Melatonin 3 MG TABS Take 3 mg by mouth at bedtime.   Yes [provider]  metoprolol tartrate (LOPRESSOR) 25 MG tablet Take 0.5 tablets (12.5 mg total) by mouth 2 (two) times daily. Patient taking differently: Take 12.5 mg by mouth daily.  06/10/17  Yes Regalado, Belkys A, MD  NIFEdipine (PROCARDIA XL/ADALAT-CC) 60 MG 24 hr tablet Take 60 mg by mouth 2 (two) times daily. 12/03/15  Yes [provider]  nitroGLYCERIN (NITROSTAT) 0.4 MG SL tablet Place 0.4 mg under the tongue See admin instructions. Mat repeat every 5 minutes. If   third tab needed call 911 06/23/17  Yes [provider]  pravastatin (PRAVACHOL) 40 MG tablet Take 40 mg by mouth daily.   Yes [provider]  umeclidinium bromide (INCRUSE ELLIPTA) 62.5 MCG/INH AEPB Inhale 1 puff into the lungs daily.   Yes [provider]  cloNIDine (CATAPRES - DOSED IN MG/24 HR) 0.2 mg/24hr patch Place 1 patch (0.2 mg total) onto the skin every 7 (seven) days. Patient not taking: Reported on 10/15/2017 08/04/17   Krishnan, Gokul,  MD  Nutritional Supplements (FEEDING SUPPLEMENT, NEPRO CARB STEADY,) LIQD Take 237 mLs by mouth 2 (two) times daily between meals. Patient not taking: Reported on 10/15/2017 06/09/17   Regalado, Belkys A, MD    Social History   Socioeconomic History  . Marital status: Widowed    Spouse name: Not on file  . Number of children: Not on file  . Years of education: Not on file  . Highest education level: Not on file  Occupational History  . Not on file  Social Needs  . Financial resource strain: Not on file  . Food insecurity:    Worry: Not on file    Inability: Not on file  . Transportation needs:    Medical: Not on file    Non-medical: Not on file  Tobacco Use  . Smoking status: Former Smoker  . Smokeless tobacco: Never Used  Substance and Sexual Activity  . Alcohol use: No  . Drug use: No  . Sexual activity: Not on file  Lifestyle  . Physical activity:    Days per week: Not on file    Minutes per session: Not on file  . Stress: Not on file  Relationships  . Social connections:    Talks on phone: Not on file    Gets together: Not on file    Attends religious service: Not on file    Active member of club or organization: Not on file    Attends meetings of clubs or organizations: Not on file    Relationship status: Not on file  . Intimate partner violence:    Fear of current or ex partner: Not on file    Emotionally abused: Not on file    Physically abused: Not on file    Forced sexual activity: Not on file  Other Topics Concern  . Not on file  Social History Narrative  . Not on file    Family History  Problem Relation Age of Onset  . Heart disease Mother   . Hypertension Mother   . Heart disease Father   . Hypertension Father   . Heart disease Sister   . Diabetes Sister   . Stroke Sister   . Kidney disease Sister   . Hypertension Sister   . Heart disease Sister   . Kidney disease Sister   . Hypertension Sister   . Kidney disease Daughter   . Hypertension  Daughter     ROS: [x] Positive   [ ] Negative   [ ] All sytems reviewed and are negative Cardiovascular: [] chest pain/pressure [] palpitations [] SOB lying flat [] DOE [] pain in legs while walking [x] pain in legs at rest [] pain in legs at night [x] non-healing ulcers [] hx of DVT [] swelling in legs  Pulmonary: [] productive cough [] asthma/wheezing [] home O2  Neurologic: [] weakness in [] arms [] legs [] numbness in [] arms [] legs [] hx of CVA [] mini stroke []difficulty speaking or slurred   speech [] temporary loss of vision in one eye [] dizziness  Hematologic: [] hx of cancer [] bleeding problems [] problems with blood clotting easily  Endocrine:   [x] diabetes [] thyroid disease  GI [] vomiting blood [] blood in stool  GU: [x] CKD/renal failure [] HD--[] M/W/F or [] T/T/S [] burning with urination [] blood in urine  Psychiatric: [] anxiety [] depression  Musculoskeletal: [] arthritis [] joint pain  Integumentary: [] rashes [x] ulcers  Constitutional: [] fever [] chills   Physical Examination  Vitals:   10/19/17 0850 10/19/17 0922  BP:  (!) 148/56  Pulse:  92  Resp:  18  Temp:  98.2 F (36.8 C)  SpO2: 100% 100%   Body mass index is 22.66 kg/m.  General:  WDWN in NAD HENT: WNL, normocephalic Pulmonary: normal non-labored breathing Cardiac: Palpable femoral popliteal pulses bilaterally Abdomen: soft, NT/ND, no masses Extremities: She has changes consistent with tip gangrene of her right great toe.  There is dark discoloration of her left great toe without gangrene Musculoskeletal: no muscle wasting or atrophy  Neurologic: A&O X 3; Appropriate Affect ; SENSATION: normal; MOTOR FUNCTION:  moving all extremities equally. Speech is fluent/normal  CBC    Component Value Date/Time   WBC 16.2 (H) 10/19/2017 0414   RBC 3.50 (L) 10/19/2017 0414   HGB 9.1 (L) 10/19/2017 0414   HCT 30.8 (L) 10/19/2017 0414   PLT 261 10/19/2017  0414   MCV 88.0 10/19/2017 0414   MCH 26.0 10/19/2017 0414   MCHC 29.5 (L) 10/19/2017 0414   RDW 20.2 (H) 10/19/2017 0414   LYMPHSABS 1.7 10/19/2017 0414   MONOABS 1.5 (H) 10/19/2017 0414   EOSABS 0.3 10/19/2017 0414   BASOSABS 0.1 10/19/2017 0414    BMET    Component Value Date/Time   NA 137 10/19/2017 0414   K 3.8 10/19/2017 0414   CL 96 (L) 10/19/2017 0414   CO2 28 10/19/2017 0414   GLUCOSE 89 10/19/2017 0414   BUN 12 10/19/2017 0414   CREATININE 3.47 (H) 10/19/2017 0414   CALCIUM 8.7 (L) 10/19/2017 0414   GFRNONAA 11 (L) 10/19/2017 0414   GFRAA 13 (L) 10/19/2017 0414    COAGS: Lab Results  Component Value Date   INR 1.74 10/15/2017   INR 1.09 07/27/2017     Non-Invasive Vascular Imaging:     ABIs are noncompressible bilateral with toe pressures that are nearly 0.  ASSESSMENT/PLAN: This is a 84 y.o. female with evidence of peripheral vascular disease and early gangrenous changes to her right great toe.  She was ambulatory until 2 months ago but has been in significant decline.  I discussed with her daughter that she is at risk for limb loss from this but hopefully we can improve her blood flow.  We will plan for aortogram with bilateral lower extremity runoff possible intervention on the right this Friday.  She will need to be n.p.o. past midnight on Thursday.  Brandon C. Cain, MD [facility] Office: [phone] Pager: [phone]  

## 2017-10-19 NOTE — Progress Notes (Signed)
  Speech Language Pathology Treatment: Dysphagia  Patient Details Name: Nicole Cox MRN: [id number] DOB: 04/04/1931 Today's Date: 10/19/2017 Time: [id number] SLP Time Calculation (min) (ACUTE ONLY): 16 min  Assessment / Plan / Recommendation Clinical Impression  Pt was seen for skilled ST targeting dysphagia goals.  Pt was awake, confused, and agreeable to participating in bedside therapies.  Pt with complaints of 10/10 toe pain, RN made aware.  Pt consumed pureed textures and nectar thick liquids with total assist for self feeding (per pt request, unclear if pt can feed herself at baseline) and no overt s/s of aspiration.  Breakfast tray was only ~25% empty but pt did eat an entire applesauce and drank 4 oz of nectar thickened orange juice.  Reviewed and reinforced swallowing precautions with pt's RN.  Pt was left in bed with bed alarm set and call bell within reach.  Continue per current plan of care.    HPI HPI: 84 year old chronically ill female with recent sepsis- now returning 2 weeks later with dec MS, elevated WBC and lactate concerning for sepsis. Hx end-stage renal disease, hypertension, prior stroke, heart failure with preserved ejection fraction, coronary artery disease as well as COPD. CXR 10/16/17 showed "unchanged diffuse interstitial densities which may reflect mild      SLP Plan  Continue with current plan of care       Recommendations  Diet recommendations: Dysphagia 1 (puree);Nectar-thick liquid Liquids provided via: Straw Medication Administration: Whole meds with puree Supervision: Full supervision/cueing for compensatory strategies Compensations: Minimize environmental distractions;Slow rate;Small sips/bites Postural Changes and/or Swallow Maneuvers: Seated upright 90 degrees;Upright 30-60 min after meal                Oral Care Recommendations: Oral care BID Follow up Recommendations: Skilled Nursing facility SLP Visit Diagnosis: Dysphagia, oropharyngeal phase  (R13.12) Plan: Continue with current plan of care       GO                Cailin Gebel, Melanee SpryNicole L 10/19/2017, 10:12 AM

## 2017-10-19 NOTE — Progress Notes (Addendum)
Daily Progress Note   Patient Name: Nicole Cox       Date: 10/19/2017 DOB: [birth year]  Age: 84 y.o. MRN#: [id number] Attending Physician: Tawni Millers Primary Care Physician: Geraldo Docker, MD Admit Date: 10/15/2017  Reason for Consultation/Follow-up: Establishing goals of care and Psychosocial/spiritual support  Subjective: Patient sad stating that she called and called for help last night and no one answered.  Met with patient as well as 5 of her family members.  Education was provided regarding her co-morbidities and current health status.  We discussed ESRD, heart disease, COPD, PVD with gangrene and aspiration pneumonia.  Family members agree that they want full scope treatment for their mother in accordance with her wishes.  However, family disagrees over DNR status.  Two family member including the HCPOA are O.R. techs and do not want resuscitation for her mother but another family member is convinced that her mother would want full resuscitation.  No agreement was reached.    For now the patient will remain a partial code (meaning no CPR, no Defib, but allowing for intubation).  I explained to the family that outside of the hospital "partial code" does not exist.  She will be either full code or DNR at discharge.   Assessment:  84 yo female with multiple medical problems.  Has suffered a steep decline in the last 2-3 months.  Attempted to provide education to family regarding current health status and future prognosis.    Patient Profile/HPI:  84 y.o. female  with past medical history of ESRD, atrial fibrillation, CVA, CHF, DM, COPD who was admitted on 10/15/2017 with aspiration pneumonia, afib, and encephalopathy. The patient experiences severe pain in her great toe  due to dry gangrene.  She has been evaluated by speech who determined her cognitive deficits make her a high risk for aspiration.  She developed rapid afib in hemodialysis on 8/4 and was unable to finish her treatment.  An additional hemodialysis session is scheduled.   Length of Stay: 4  Current Medications: Scheduled Meds:  . arformoterol  15 mcg Nebulization BID  . aspirin EC  81 mg Oral Daily  . budesonide (PULMICORT) nebulizer solution  0.5 mg Nebulization BID  . chlorhexidine  15 mL Mouth Rinse BID  . darbepoetin (ARANESP) injection - DIALYSIS  200 mcg Intravenous Q  Sat-HD  . diphenhydrAMINE  25 mg Oral Once  . fentaNYL  25 mcg Transdermal Q72H  . heparin  5,000 Units Subcutaneous Q8H  . insulin aspart  0-9 Units Subcutaneous Q4H  . mouth rinse  15 mL Mouth Rinse q12n4p  . sodium chloride flush  3 mL Intravenous Q12H    Continuous Infusions: . sodium chloride 10 mL/hr at 10/17/17 0600  . sodium chloride Stopped (10/17/17 2100)  . meropenem (MERREM) IV Stopped (10/17/17 1748)    PRN Meds: sodium chloride, sodium chloride, acetaminophen, fentaNYL (SUBLIMAZE) injection, RESOURCE THICKENUP CLEAR, sodium chloride flush  Physical Exam        Elderly female, awake, semi crying repeating that no one answered her calls last night. CV irreg resp no distress on 2L Abdomen soft, nd, nt  Vital Signs: BP (!) 148/56 (BP Location: Right Arm)   Pulse 92   Temp 98.2 F (36.8 C) (Oral)   Resp 18   Ht _0  (1.575 m)   Wt 56.2 kg (123 lb 14.4 oz)   SpO2 100%   BMI 22.66 kg/m  SpO2: SpO2: 100 % O2 Device: O2 Device: Nasal Cannula O2 Flow Rate: O2 Flow Rate (L/min): 2 L/min  Intake/output summary:   Intake/Output Summary (Last 24 hours) at 10/19/2017 1413 Last data filed at 10/19/2017 1334 Gross per 24 hour  Intake 300 ml  Output 2500 ml  Net -2200 ml   LBM: Last BM Date: 10/17/17 Baseline Weight: Weight: 59.4 kg (130 lb 15.3 oz) Most recent weight: Weight: 56.2 kg (123 lb  14.4 oz)       Palliative Assessment/Data:  30%    Flowsheet Rows     Most Recent Value  Intake Tab  Referral Department  Hospitalist  Unit at Time of Referral  ICU  Date Notified  10/16/17  Palliative Care Type  Return patient Palliative Care  Reason for referral  Clarify Goals of Care  Date of Admission  10/15/17  Date first seen by Palliative Care  10/18/17  # of days Palliative referral response time  2 Day(s)  # of days IP prior to Palliative referral  1  Clinical Assessment  Psychosocial & Spiritual Assessment  Palliative Care Outcomes      Patient Active Problem List   Diagnosis Date Noted  . Altered mental status   . ESRD on dialysis ([place])   . Persistent atrial fibrillation ([place])   . Dry gangrene ([place])   . Sepsis ([place]) 10/15/2017  . Delirium 09/25/2017  . Bacteremia   . SIRS (systemic inflammatory response syndrome) ([place]) 07/27/2017  . Malnutrition of moderate degree 06/10/2017  . Pressure injury of skin 06/08/2017  . Acute pulmonary edema ([place]) 06/07/2017  . Respiratory failure with hypoxia ([place]) 12/12/2015  . Acute on chronic respiratory failure with hypoxia ([place]) 12/12/2015  . Fluid overload 12/12/2015  . Palliative care encounter   . Acute respiratory failure with hypoxia ([place]) 08/13/2015  . ESRD on hemodialysis ([place]) 08/13/2015  . Hypertensive urgency 08/13/2015  . Chronic anemia 08/13/2015  . Acute respiratory failure ([place]) 08/13/2015  . HCAP (healthcare-associated pneumonia) 08/11/2012  . Hypoxia 08/11/2012  . Hypertension 08/11/2012  . DM (diabetes mellitus), type 2 with renal complications ([place]) 17/91/5056  . C1 cervical fracture ([place]) 08/11/2012  . Abnormal MRI, thoracic spine 08/11/2012  . COPD (chronic obstructive pulmonary disease) ([place]) 08/11/2012  . CAD (coronary artery disease) 08/11/2012  . Fall 08/11/2012    Palliative Care Plan    Recommendations/Plan:  Continue to update family (  Burnell Blanks is HCPOA) regarding health status.    PMT will continue to follow as needed.  Patient seems to be tolerating 25 mcg fentanyl patch well.  Goals of Care and Additional Recommendations:  Limitations on Scope of Treatment: Full Scope Treatment  Code Status:  Limited code  Prognosis:   < 6 months would not be surprising given rapid decline, aspiration pneumonia with recurrent infections, PVD with gangrene in big toes, and heart disease.   Discharge Planning:  [place] for rehab with Palliative care service follow-up  Care plan was discussed with nephrology and patient's family.  Thank you for allowing the Palliative Medicine Team to assist in the care of this patient.  Total time spent:  90 min. Time in 1:00 pm Time out 2:30 pm     Greater than 50%  of this time was spent counseling and coordinating care related to the above assessment and plan.  Florentina Jenny, PA-C Palliative Medicine  Please contact Palliative MedicineTeam phone at [phone] for questions and concerns between 7 am - 7 pm.   Please see AMION for individual provider pager numbers.

## 2017-10-20 DIAGNOSIS — Z66 Do not resuscitate: Secondary | ICD-10-CM

## 2017-10-20 LAB — BASIC METABOLIC PANEL
Anion gap: 12 (ref 5–15)
BUN: 12 mg/dL (ref 8–23)
CALCIUM: 8.8 mg/dL — AB (ref 8.9–10.3)
CO2: 27 mmol/L (ref 22–32)
CREATININE: 3.01 mg/dL — AB (ref 0.44–1.00)
Chloride: 98 mmol/L (ref 98–111)
GFR, EST AFRICAN AMERICAN: 15 mL/min — AB (ref >60)
GFR, EST NON AFRICAN AMERICAN: 13 mL/min — AB (ref >60)
Glucose, Bld: 145 mg/dL — ABNORMAL HIGH (ref 70–99)
Potassium: 4 mmol/L (ref 3.5–5.1)
SODIUM: 137 mmol/L (ref 135–145)

## 2017-10-20 LAB — GLUCOSE, CAPILLARY
GLUCOSE-CAPILLARY: 127 mg/dL — AB (ref 70–99)
GLUCOSE-CAPILLARY: 137 mg/dL — AB (ref 70–99)
GLUCOSE-CAPILLARY: 132 mg/dL — AB (ref 70–99)
Glucose-Capillary: 128 mg/dL — ABNORMAL HIGH (ref 70–99)
Glucose-Capillary: 123 mg/dL — ABNORMAL HIGH (ref 70–99)

## 2017-10-20 LAB — CBC WITH DIFFERENTIAL/PLATELET
Abs Immature Granulocytes: 0.3 10*3/uL — ABNORMAL HIGH (ref 0.0–0.1)
BASOS ABS: 0.1 10*3/uL (ref 0.0–0.1)
BASOS PCT: 1 %
Eosinophils Absolute: 0.3 10*3/uL (ref 0.0–0.7)
Eosinophils Relative: 2 %
HCT: 31.1 % — ABNORMAL LOW (ref 36.0–46.0)
Hemoglobin: 9.2 g/dL — ABNORMAL LOW (ref 12.0–15.0)
Immature Granulocytes: 2 %
Lymphocytes Relative: 10 %
Lymphs Abs: 1.6 10*3/uL (ref 0.7–4.0)
MCH: 26.1 pg (ref 26.0–34.0)
MCHC: 29.6 g/dL — AB (ref 30.0–36.0)
MCV: 88.4 fL (ref 78.0–100.0)
Monocytes Absolute: 1.6 10*3/uL — ABNORMAL HIGH (ref 0.1–1.0)
Monocytes Relative: 10 %
Neutro Abs: 11.6 10*3/uL — ABNORMAL HIGH (ref 1.7–7.7)
Neutrophils Relative %: 75 %
PLATELETS: 332 10*3/uL (ref 150–400)
RBC: 3.52 MIL/uL — AB (ref 3.87–5.11)
RDW: 20.3 % — AB (ref 11.5–15.5)
WBC: 15.5 10*3/uL — AB (ref 4.0–10.5)

## 2017-10-20 LAB — CULTURE, BLOOD (ROUTINE X 2)
CULTURE: NO GROWTH
CULTURE: NO GROWTH
Culture: NO GROWTH
SPECIAL REQUESTS: ADEQUATE
Special Requests: ADEQUATE

## 2017-10-20 MED ORDER — FENTANYL 12 MCG/HR TD PT72
12.5000 ug | MEDICATED_PATCH | TRANSDERMAL | Status: DC
  Administered 2017-10-20: 12.5 ug via TRANSDERMAL
  Filled 2017-10-20: qty 1

## 2017-10-20 MED ORDER — CHLORHEXIDINE GLUCONATE CLOTH 2 % EX PADS
6.0000 | MEDICATED_PAD | Freq: Every day | CUTANEOUS | Status: DC
  Administered 2017-10-22: 6 via TOPICAL

## 2017-10-20 MED ORDER — DARBEPOETIN ALFA 200 MCG/0.4ML IJ SOSY
200.0000 ug | PREFILLED_SYRINGE | INTRAMUSCULAR | Status: DC
  Administered 2017-10-21: 200 ug via INTRAVENOUS
  Filled 2017-10-20: qty 0.4

## 2017-10-20 MED ORDER — GABAPENTIN 600 MG PO TABS
300.0000 mg | ORAL_TABLET | Freq: Two times a day (BID) | ORAL | Status: DC
  Administered 2017-10-20 – 2017-10-21 (×3): 300 mg via ORAL
  Filled 2017-10-20 (×3): qty 1

## 2017-10-20 MED ORDER — HYDROMORPHONE HCL 2 MG PO TABS: 2.0000 mg | ORAL_TABLET | ORAL | Status: DC | PRN

## 2017-10-20 MED ORDER — HYDROMORPHONE HCL 2 MG PO TABS
2.0000 mg | ORAL_TABLET | ORAL | Status: DC | PRN
  Administered 2017-10-20 – 2017-10-26 (×10): 2 mg via ORAL
  Filled 2017-10-20 (×10): qty 1

## 2017-10-20 NOTE — Progress Notes (Addendum)
Daily Progress Note   Patient Name: Nicole Cox       Date: 10/20/2017 DOB: [birth year]  Age: 84 y.o. MRN#: [id number] Attending Physician: Zannie Cove, MD Primary Care Physician: Joana Reamer, MD Admit Date: 10/15/2017  Reason for Consultation/Follow-up: Establishing goals of care, Pain control and Psychosocial/spiritual support  Subjective: [name], [id number], called me to make certain that Mrs. Vandervoort was now a full DNR.  We talked about the disagreement between family members.  [name] is certain that her mother needs DNR in place as a protective measure despite how other family members may feel.  [name] talked with me about her mother's pain and pain control.  We talked about the potential diagnostic test scheduled for Friday.  [name] asked what would be done with the test results?  She is an OR tech and explained that she does not believe her mother would do well under anesthesia.  She does not feel her mother would benefit if the test results indicated that a larger surgery should be done.   I deferred to Dr. Randie Heinz and committed to [facility] that I would ask him to call her.  I explained that the alternative to surgery would be increased pain medications.  [name] immediately understood that her mother would be more sedated.    I visited Ms.  Sequoya who is crying out in pain but is able to answer questions.  She is eating well.  [name] tells me both of her feet hurt.  The pain is aching and feels like pins and needles.  Discussed pain regimen with RN who had concerns about spells of delirium she has witnessed in the patient.   Assessment: Patient is significant pain primarily due to gangrene in both great toes but also due to her back.  HCPOA has opted for DNR and  indicated to me that she wants pain control rather than surgery.   Patient Profile/HPI:  84 y.o. female  with past medical history of ESRD, atrial fibrillation, CVA, CHF, DM, COPD who was admitted on 10/15/2017 with aspiration pneumonia, afib, and encephalopathy. The patient experiences severe pain in her great toe due to dry gangrene.  She has been evaluated by speech who determined her cognitive deficits make her a high risk for aspiration.  She developed rapid afib in hemodialysis on 8/4  and was unable to finish her treatment.  An additional hemodialysis session was scheduled.  Length of Stay: 5  Current Medications: Scheduled Meds:  . arformoterol  15 mcg Nebulization BID  . aspirin EC  81 mg Oral Daily  . atorvastatin  20 mg Oral q1800  . budesonide (PULMICORT) nebulizer solution  0.5 mg Nebulization BID  . chlorhexidine  15 mL Mouth Rinse BID  . Chlorhexidine Gluconate Cloth  6 each Topical Q0600  . darbepoetin (ARANESP) injection - DIALYSIS  200 mcg Intravenous Q Sat-HD  . diphenhydrAMINE  25 mg Oral Once  . fentaNYL  12.5 mcg Transdermal Q72H  . fentaNYL  25 mcg Transdermal Q72H  . gabapentin  300 mg Oral BID  . heparin  5,000 Units Subcutaneous Q8H  . insulin aspart  0-9 Units Subcutaneous Q4H  . mouth rinse  15 mL Mouth Rinse q12n4p  . sodium chloride flush  3 mL Intravenous Q12H    Continuous Infusions: . sodium chloride 10 mL/hr at 10/17/17 0600  . sodium chloride Stopped (10/17/17 2100)  . meropenem (MERREM) IV 500 mg (10/19/17 1859)    PRN Meds: sodium chloride, sodium chloride, acetaminophen, fentaNYL (SUBLIMAZE) injection, HYDROmorphone, RESOURCE THICKENUP CLEAR, sodium chloride flush  Physical Exam        Elderly chronically ill appearing female, moaning in pain CV irreg resp no distress Abdomen soft, nt, nd LE blackened great toes, no edema.   Vital Signs: BP (!) 160/59 (BP Location: Right Arm)   Pulse 93   Temp 98.8 F (37.1 C) (Oral)   Resp 18   Ht 5'  2" (1.575 m)   Wt 54.5 kg (120 lb 2.4 oz)   SpO2 100%   BMI 21.98 kg/m  SpO2: SpO2: 100 % O2 Device: O2 Device: Nasal Cannula O2 Flow Rate: O2 Flow Rate (L/min): 2 L/min  Intake/output summary:   Intake/Output Summary (Last 24 hours) at 10/20/2017 1340 Last data filed at 10/20/2017 [id number] Gross per 24 hour  Intake 223.11 ml  Output 2000 ml  Net -1776.89 ml   LBM: Last BM Date: 10/17/17 Baseline Weight: Weight: 59.4 kg (130 lb 15.3 oz) Most recent weight: Weight: 54.5 kg (120 lb 2.4 oz)       Palliative Assessment/Data:    Flowsheet Rows     Most Recent Value  Intake Tab  Referral Department  Hospitalist  Unit at Time of Referral  ICU  Date Notified  10/16/17  Palliative Care Type  Return patient Palliative Care  Reason for referral  Clarify Goals of Care  Date of Admission  10/15/17  Date first seen by Palliative Care  10/18/17  # of days Palliative referral response time  2 Day(s)  # of days IP prior to Palliative referral  1  Clinical Assessment  Psychosocial & Spiritual Assessment  Palliative Care Outcomes      Patient Active Problem List   Diagnosis Date Noted  . Oropharyngeal dysphagia   . Altered mental status   . ESRD on dialysis (HCC)   . Persistent atrial fibrillation (HCC)   . Dry gangrene (HCC)   . Sepsis (HCC) 10/15/2017  . Delirium 09/25/2017  . Bacteremia   . SIRS (systemic inflammatory response syndrome) (HCC) 07/27/2017  . Malnutrition of moderate degree 06/10/2017  . Pressure injury of skin 06/08/2017  . Acute pulmonary edema (HCC) 06/07/2017  . Respiratory failure with hypoxia (HCC) 12/12/2015  . Acute on chronic respiratory failure with hypoxia (HCC) 12/12/2015  . Fluid overload 12/12/2015  . Palliative  care encounter   . Acute respiratory failure with hypoxia (HCC) 08/13/2015  . ESRD on hemodialysis (HCC) 08/13/2015  . Hypertensive urgency 08/13/2015  . Chronic anemia 08/13/2015  . Acute respiratory failure (HCC) 08/13/2015  . HCAP  (healthcare-associated pneumonia) 08/11/2012  . Hypoxia 08/11/2012  . Hypertension 08/11/2012  . DM (diabetes mellitus), type 2 with renal complications (HCC) 08/11/2012  . C1 cervical fracture (HCC) 08/11/2012  . Abnormal MRI, thoracic spine 08/11/2012  . COPD (chronic obstructive pulmonary disease) (HCC) 08/11/2012  . CAD (coronary artery disease) 08/11/2012  . Fall 08/11/2012    Palliative Care Plan    Recommendations/Plan:  Code status changed to DNR/DNI per HCPOA  Discussed with Dr. Randie Heinz.  HCPOA is opting for palliative pain control over surgery.  Reviewed pain control and discussed with RN and Tech.  Increasing fentanyl patch to 37.5 mcg q 72 hours.  (this will take effect gradually over 24 - 48 hours)  Will add low dose oral dilaudid as it will last longer than IV fentanyl.    Will add gabapentin as an adjuvent (starting low dose 300 mg bid).  When appropriate for discharge please request Palliative to follow at SNF in DC summary  Goals of Care and Additional Recommendations:  Limitations on Scope of Treatment: No Surgical Procedures  Code Status:  DNR  Prognosis:   < 6 months given rapid decline, multiple medical problems, gangrenous foot which will be managed conservatively without surgery.   Discharge Planning:  Skilled Nursing Facility for rehab with Palliative care service follow-up  Care plan was discussed with HCPOA, patient and RN.  Thank you for allowing the Palliative Medicine Team to assist in the care of this patient.  Total time spent:  35 min.     Greater than 50%  of this time was spent counseling and coordinating care related to the above assessment and plan.  Norvel Richards, PA-C Palliative Medicine Pager: [phone] Please contact Palliative MedicineTeam phone at [phone] for questions and concerns between 7 am - 7 pm.   Please see AMION for individual provider pager numbers.

## 2017-10-20 NOTE — Progress Notes (Signed)
SLP Cancellation Note  Patient Details Name: Nicole Cox MRN: [id number] DOB: [birth year]   Cancelled treatment:       Reason Eval/Treat Not Completed: Fatigue/lethargy limiting ability to participate   Tressie StalkerPat Hudson Lehmkuhl, M.S., CCC-SLP 10/20/2017, 2:24 PM

## 2017-10-20 NOTE — Progress Notes (Addendum)
[facility] Progress Note   Subjective: Sleeping, easily aroused.   Objective Vitals:   10/20/17 0420 10/20/17 0813 10/20/17 0818 10/20/17 0949  BP: (!) 148/55   (!) 160/59  Pulse: 92 76  93  Resp: 16 18  18   Temp: 98.6 F (37 C)   98.8 F (37.1 C)  TempSrc: Oral   Oral  SpO2: 100% 100% 100% 100%  Weight: 54.5 kg (120 lb 2.4 oz)     Height:       Physical Exam General: Frail elderly patient in NAD Heart: s1,S2 2/6 systolic M Lungs: CTAB still decreased in bases Abdomen: active BS Extremities: No LE edema Dialysis Access: LUA AVG + bruit  Additional Objective Labs: Basic Metabolic Panel: Recent Labs  Lab 10/16/17 0709 10/16/17 0856  10/18/17 0403 10/19/17 0414 10/20/17 0538  NA 141 139   < > 135 137 137  K 4.7 4.2   < > 3.7 3.8 4.0  CL 104 102   < > 97* 96* 98  CO2 24 26   < > 25 28 27   GLUCOSE 99 101*   < > 106* 89 145*  BUN 37* 26*   < > 22 12 12   CREATININE 5.41* 3.83*   < > 4.63* 3.47* 3.01*  CALCIUM 8.8* 8.4*   < > 8.7* 8.7* 8.8*  PHOS 6.0* 4.2  --   --  3.6  --    < > = values in this interval not displayed.   Liver Function Tests: Recent Labs  Lab 10/15/17 0935 10/15/17 1515 10/16/17 0856 10/19/17 0414  AST 35 26  --   --   ALT 18 16  --   --   ALKPHOS 94 95  --   --   BILITOT 0.6 0.6  --   --   PROT 8.4* 7.7  --   --   ALBUMIN 2.7* 2.6* 2.5* 2.4*   Recent Labs  Lab 10/15/17 0935  LIPASE 45   CBC: Recent Labs  Lab 10/16/17 0839 10/17/17 1021 10/18/17 0403 10/19/17 0414 10/20/17 0538  WBC 11.3* 12.1* 13.4* 16.2* 15.5*  NEUTROABS  --   --  10.1* 12.2* 11.6*  HGB 7.0* 8.4* 8.8* 9.1* 9.2*  HCT 24.2* 27.8* 29.7* 30.8* 31.1*  MCV 92.7 89.4 88.1 88.0 88.4  PLT 216 221 256 261 332   Blood Culture    Component Value Date/Time   SDES BLOOD RIGHT HAND 10/15/2017 1515   SPECREQUEST  10/15/2017 1515    BOTTLES DRAWN AEROBIC ONLY Blood Culture results may not be optimal due to an inadequate volume of blood received in  culture bottles   CULT  10/15/2017 1515    NO GROWTH 4 DAYS Performed at [facility], [address] [place], [place] [address]    REPTSTATUS PENDING 10/15/2017 1515    Cardiac Enzymes: Recent Labs  Lab 10/15/17 1515 10/16/17 0022 10/16/17 0709  TROPONINI 1.30* 1.65* 1.33*   CBG: Recent Labs  Lab 10/19/17 1803 10/19/17 2015 10/19/17 2357 10/20/17 0416 10/20/17 0808  GLUCAP 120* 128* 176* 127* 137*   Iron Studies: No results for input(s): IRON, TIBC, TRANSFERRIN, FERRITIN in the last 72 hours. @lablastinr3 @ Studies/Results: No results found. Medications: . sodium chloride 10 mL/hr at 10/17/17 0600  . sodium chloride Stopped (10/17/17 2100)  . meropenem (MERREM) IV 500 mg (10/19/17 1859)   . arformoterol  15 mcg Nebulization BID  . aspirin EC  81 mg Oral Daily  . atorvastatin  20 mg  Oral q1800  . budesonide (PULMICORT) nebulizer solution  0.5 mg Nebulization BID  . chlorhexidine  15 mL Mouth Rinse BID  . darbepoetin (ARANESP) injection - DIALYSIS  200 mcg Intravenous Q Sat-HD  . diphenhydrAMINE  25 mg Oral Once  . fentaNYL  25 mcg Transdermal Q72H  . heparin  5,000 Units Subcutaneous Q8H  . insulin aspart  0-9 Units Subcutaneous Q4H  . mouth rinse  15 mL Mouth Rinse q12n4p  . sodium chloride flush  3 mL Intravenous Q12H     Dialysis: [facility]/ Dr Louis Meckel TTS 3h 121lbs 2/2.5 Hep 2000 then 250 / hr AVG  - mircera150 q 2 weeks- last given 7/25;last hgb at center 8.7 -hect 1 mcg IV q tx  Summary:84 year old chronically ill female with recent sepsis- now returning 2 weeks later with dec MS, elevated WBC and lactate concerning for sepsis.   Assessment/Plan: 1AMS/ Asp PNA/ sepsis- per CCM, on Meropenem. BC NG X 4 days. WBC 15.5 Afebrile past 24 hours.  2 ESRD:normally TTS as OP via AVG.HD tomorrow on schedule.  3 Hypertension/Volume:normally very hypertensive on multiplemeds. Had HD 10/18/17 and 10/19/17, pre wt 56.9 kg Net UF 2.0  liters Post wt 54 kg. 1 kg under OP EDW, BP looks better today. Lower EDW on DC.  4Anemia of ESRD:HGB 7.0 on adm. Rec'd 1 unit PRBCs 10/16/17. HGB 9.2 today. Rec'd Aranesp 100 mcg IV in HD 10/19/17. Hold Fe load D/T sepsis.  5. Metabolic Bone Disease:Renal function panel added to today's labs. Ca 8.7 C Ca 9.9.  6. Decreased MS- possibly due to sepsis vs reported new neurontin, improving 7. Afib-SR on monitor. Amiodarone DC'd. No anticoagulation.  8. R Great Toe Necrosis-  dry gangrene to tip of R big toes, per primary- LE dopplers done yesterday. VVS consulted. For Aortagram per Dr. Randie Heinz tomorrow.  9. GOC-palliative care consult. Patient now partial code. Wishes to continue hemodialysis.  Pt very frail w/ very poor prognosis overall. Have d/w family and PC team yesterday.  Will follow.    Rita H. Brown NP-C 10/20/2017, 10:29 AM   [facility] [phone]  Pt seen, examined, agree w assess/plan as above with additions as indicated.  Vinson Moselle MD [employer] pager [phone]    cell [phone] 10/20/2017, 11:36 AM

## 2017-10-20 NOTE — NC FL2 (Signed)
[place] MEDICAID FL2 LEVEL OF CARE SCREENING TOOL     IDENTIFICATION  Patient Name: Nicole Cox Birthdate: 26-Jul-1931 Sex: female Admission Date (Current Location): 10/15/2017  [facility] [place]   Facility and Address:  [facility], [address], [place], [place] [address]      Provider Number: [id number]  Attending Physician Name and Address:  Zannie CoveJoseph, Preetha, MD  Relative Name and Phone Number:  Zachery DauerShakoya Hatcher - daughter; [phone]    Current Level of Care: Hospital Recommended Level of Care: Skilled Nursing Facility(From [facility]) Prior Approval Number:    Date Approved/Denied:   PASRR Number: [id number] [id number]  Discharge Plan: SNF    Current Diagnoses: Patient Active Problem List   Diagnosis Date Noted  . DNR (do not resuscitate)   . Oropharyngeal dysphagia   . Altered mental status   . ESRD on dialysis (HCC)   . Persistent atrial fibrillation (HCC)   . Dry gangrene (HCC)   . Sepsis (HCC) 10/15/2017  . Delirium 09/25/2017  . Bacteremia   . SIRS (systemic inflammatory response syndrome) (HCC) 07/27/2017  . Malnutrition of moderate degree 06/10/2017  . Pressure injury of skin 06/08/2017  . Acute pulmonary edema (HCC) 06/07/2017  . Respiratory failure with hypoxia (HCC) 12/12/2015  . Acute on chronic respiratory failure with hypoxia (HCC) 12/12/2015  . Fluid overload 12/12/2015  . Palliative care encounter   . Acute respiratory failure with hypoxia (HCC) 08/13/2015  . ESRD on hemodialysis (HCC) 08/13/2015  . Hypertensive urgency 08/13/2015  . Chronic anemia 08/13/2015  . Acute respiratory failure (HCC) 08/13/2015  . HCAP (healthcare-associated pneumonia) 08/11/2012  . Hypoxia 08/11/2012  . Hypertension 08/11/2012  . DM (diabetes mellitus), type 2 with renal complications (HCC) 08/11/2012  . C1 cervical fracture (HCC) 08/11/2012  . Abnormal MRI, thoracic spine 08/11/2012  . COPD (chronic obstructive  pulmonary disease) (HCC) 08/11/2012  . CAD (coronary artery disease) 08/11/2012  . Fall 08/11/2012    Orientation RESPIRATION BLADDER Height & Weight     Self, Place, Situation  Normal Continent Weight: 120 lb 2.4 oz (54.5 kg) Height:  5\' 2"  (157.5 cm)  BEHAVIORAL SYMPTOMS/MOOD NEUROLOGICAL BOWEL NUTRITION STATUS      Continent Diet(DYS 1)  AMBULATORY STATUS COMMUNICATION OF NEEDS Skin   Extensive Assist Verbally Other (Comment)(Cracking bilateral feet; MASD to mid buttocks - area cleansed and foam dressing applied.)                       Personal Care Assistance Level of Assistance  Bathing, Feeding, Dressing Bathing Assistance: Maximum assistance Feeding assistance: Limited assistance Dressing Assistance: Maximum assistance     Functional Limitations Info  Sight, Hearing, Speech Sight Info: Adequate Hearing Info: Adequate Speech Info: Adequate    SPECIAL CARE FACTORS FREQUENCY  Speech therapy             Speech Therapy Frequency: Speech eval 8/4      Contractures Contractures Info: Not present    Additional Factors Info  Code Status, Insulin Sliding Scale Code Status Info: DNR Allergies Info: No known allergies   Insulin Sliding Scale Info: 0-9 Units every 4 hours       Current Medications (10/20/2017):  This is the current hospital active medication list Current Facility-Administered Medications  Medication Dose Route Frequency Provider Last Rate Last Dose  . 0.9 %  sodium chloride infusion  250 mL Intravenous PRN Lupita LeashMcQuaid, Douglas B, MD 10 mL/hr at 10/17/17 0600    .  0.9 %  sodium chloride infusion  250 mL Intravenous PRN Lupita Leash, MD   Stopped at 10/17/17 2100  . acetaminophen (TYLENOL) tablet 650 mg  650 mg Oral Q6H PRN Lupita Leash, MD   650 mg at 10/18/17 2133  . arformoterol (BROVANA) nebulizer solution 15 mcg  15 mcg Nebulization BID Max Fickle B, MD   15 mcg at 10/20/17 0813  . aspirin EC tablet 81 mg  81 mg Oral Daily  Arrien, York Ram, MD   81 mg at 10/20/17 1043  . atorvastatin (LIPITOR) tablet 20 mg  20 mg Oral q1800 Arrien, York Ram, MD   20 mg at 10/19/17 1857  . budesonide (PULMICORT) nebulizer solution 0.5 mg  0.5 mg Nebulization BID Max Fickle B, MD   0.5 mg at 10/20/17 0813  . chlorhexidine (PERIDEX) 0.12 % solution 15 mL  15 mL Mouth Rinse BID Max Fickle B, MD   15 mL at 10/20/17 0844  . Chlorhexidine Gluconate Cloth 2 % PADS 6 each  6 each Topical Q0600 Pola Corn, NP      . Darbepoetin Alfa (ARANESP) injection 200 mcg  200 mcg Intravenous Q Sat-HD Max Fickle B, MD      . diphenhydrAMINE (BENADRYL) capsule 25 mg  25 mg Oral Once Lupita Leash, MD   Stopped at 10/16/17 1242  . fentaNYL (DURAGESIC - dosed mcg/hr) 12.5 mcg  12.5 mcg Transdermal Q72H Dellinger, Marianne L, PA-C   12.5 mcg at 10/20/17 1352  . fentaNYL (DURAGESIC - dosed mcg/hr) patch 25 mcg  25 mcg Transdermal Q72H Dellinger, Marianne L, PA-C   25 mcg at 10/18/17 1225  . fentaNYL (SUBLIMAZE) injection 12.5-25 mcg  12.5-25 mcg Intravenous Q2H PRN Lupita Leash, MD   25 mcg at 10/20/17 1125  . gabapentin (NEURONTIN) tablet 300 mg  300 mg Oral BID Dellinger, Marianne L, PA-C   300 mg at 10/20/17 1350  . heparin injection 5,000 Units  5,000 Units Subcutaneous Q8H Max Fickle B, MD   5,000 Units at 10/20/17 1355  . HYDROmorphone (DILAUDID) tablet 2 mg  2 mg Oral Q4H PRN Dellinger, Marianne L, PA-C   2 mg at 10/20/17 1351  . insulin aspart (novoLOG) injection 0-9 Units  0-9 Units Subcutaneous Q4H Lupita Leash, MD   1 Units at 10/20/17 1352  . MEDLINE mouth rinse  15 mL Mouth Rinse q12n4p Max Fickle B, MD   15 mL at 10/20/17 1358  . meropenem (MERREM) 500 mg in sodium chloride 0.9 % 100 mL IVPB  500 mg Intravenous Q24H Zannie Cove, MD 200 mL/hr at 10/19/17 1859 500 mg at 10/19/17 1859  . RESOURCE THICKENUP CLEAR   Oral PRN Arrien, York Ram, MD      . sodium chloride  flush (NS) 0.9 % injection 3 mL  3 mL Intravenous Q12H Max Fickle B, MD   3 mL at 10/20/17 1044  . sodium chloride flush (NS) 0.9 % injection 3 mL  3 mL Intravenous PRN Lupita Leash, MD         Discharge Medications: Please see discharge summary for a list of discharge medications.  Relevant Imaging Results:  Relevant Lab Results:   Additional Information ss#[id number]. Dialysis TTS - [facility]  Okey Dupre Lazaro Arms, LCSW

## 2017-10-20 NOTE — Clinical Social Work Note (Signed)
CSW visited room to talk with patient (2:40 pm). Ms. Nicole Cox was lying in bed awake, looked at this SW and continually called out "Help Me!", and "Help me Jesus!", and was unable to engage in conversation with CSW. Attempted to reach daughter, Zachery DauerShakoya Hatcher - [phone] and message left. Ms. Nicole Cox [facility] and shadow chart paperwork reviewed and patient initially admitted to facility 08/04/17 and her primary payor for facility is pending Medicaid. CSW will continue to monitor patient's progress and attempt talk with patient, if she is able to engage with CSW or talk with daughter to confirm discharge disposition.  Genelle BalVanessa Julissa Browning, MSW, LCSW Licensed Clinical Social Worker Clinical Social Work Department [facility] [phone]

## 2017-10-20 NOTE — Progress Notes (Addendum)
   Plan is for angiogram with right lower extremity intervention possible on Friday with my partner Dr. Imogene Burnhen.  She will need to be n.p.o. past midnight tomorrow.  I have again discussed with her family and they are in agreement to proceed on Friday.   Leslyn Monda C. Randie Heinzain, MD [facility] [place] Office: [phone] Pager: [phone]

## 2017-10-20 NOTE — Progress Notes (Signed)
PROGRESS NOTE    Nicole Cox  ZOX:[id number] DOB: 1931/12/27 DOA: 10/15/2017 PCP: Joana Reamer, MD    Brief Narrative:  84 yo female who presented with altered mental status. She does have the significant medical history of ESRD on HD, HTN, CVA, diastolic heart failure, CAD and COPD. Recent hospitalization forAcinetobacter pneumonia. Discharged (07/20) on Imipenem that she did not complete due to declined IV access at the SNF. Head CT without atrophy, no acute changes. Chest x-ray with right lower lobe alveolar/interstitial infiltrate.Patient was admitted to hospital with working diagnosis of right lower lobe pneumonia -Noted to have peripheral vascular disease with gangrenous changes in both feet   Assessment & Plan:   1. Aspiration pneumonia (present on admission).  -has been on IV imipenem from prior to admission, this was changed to meropenem on admission, day 6 now -We will plan to discontinue this after 7 days -clinically stable and improving from this standpoint -SLP evaluation completed, noted to have dysphagia currently on dysphagia 1 diet with nectar thick liquids  2. Acute metabolic encephalopathy -due to above infection and narcotics -improving  3. New onset atrial fibrillation. Heart rate 85 to 100 sinus, will continue telemetry monitoring, no anticoagulation due to risk of bleeding.  -overall prognosis is very poor, add aspirin  4. ESRD on HDwith anemia of chronic renal disease. Patient will have HD today, continue to follow nephrology recommendations.    5. COPD with pulmonary hypertension No signs of acute exacerbation, contine arfomoterol and budesonide.   6. T2DM. Continue with glucose cover and monitoring with insulin sliding scale. Capillary glucose 103, 89, 79, 100, 119.   7. New right foot great toe necrosis. Positive tibial artery stenosis on arterial dopplers, with reported 50 to 74% stenosis bilaterally.  -she is a very poor surgical candidate,  radius following, continue aspirin and statin -Plan for aortogram on Friday  Ethics: this is a very frail chronically ill patient with ESRD, COPD, A. Fib, dysphagia, aspiration pneumonia, severe peripheral vascular disease with bilateral gangrene -palliative medicine following, now DO NOT RESUSCITATE  DVT prophylaxis:scd Code Status:dnr Family Communication:. Bedside Disposition Plan/ discharge barriers:to be determinedbased on vascular evaluations and palliative discussions   Consultants:  vascular surgery  Palliative medicine  Procedures:    Antimicrobials: Meropenem 8/2   Subjective: -continues to complain often moans in pain  Objective: Vitals:   10/20/17 0420 10/20/17 0813 10/20/17 0818 10/20/17 0949  BP: (!) 148/55   (!) 160/59  Pulse: 92 76  93  Resp: 16 18  18   Temp: 98.6 F (37 C)   98.8 F (37.1 C)  TempSrc: Oral   Oral  SpO2: 100% 100% 100% 100%  Weight: 54.5 kg (120 lb 2.4 oz)     Height:        Intake/Output Summary (Last 24 hours) at 10/20/2017 1521 Last data filed at 10/20/2017 0655 Gross per 24 hour  Intake 223.11 ml  Output 2000 ml  Net -1776.89 ml   Filed Weights   10/19/17 1409 10/19/17 1648 10/20/17 0420  Weight: 56.9 kg (125 lb 7.1 oz) 54 kg (119 lb 0.8 oz) 54.5 kg (120 lb 2.4 oz)    Examination:   Gen: elderly, chronically ill-appearing female, moaning in pain uncomfortable HEENT: PERRLA, Neck supple, no JVD Lungs: poor air movement CVS: RRR,No Gallops,Rubs or new Murmurs Abd: soft, Non tender, non distended, BS present Extremities: gangrenous changes in both feet right greater than left especially right great toe Skin: gangrenous changes as above  Data Reviewed: I have personally reviewed following labs and imaging studies  CBC: Recent Labs  Lab 10/15/17 0935 10/15/17 1515  10/16/17 0839 10/17/17 1021 10/18/17 0403 10/19/17 0414 10/20/17 0538  WBC 16.1* 15.5*   < > 11.3* 12.1* 13.4* 16.2*  15.5*  NEUTROABS 13.0* 11.4*  --   --   --  10.1* 12.2* 11.6*  HGB 8.9* 8.7*   < > 7.0* 8.4* 8.8* 9.1* 9.2*  HCT 31.3* 30.7*   < > 24.2* 27.8* 29.7* 30.8* 31.1*  MCV 92.6 93.0   < > 92.7 89.4 88.1 88.0 88.4  PLT 300 256   < > 216 221 256 261 332   < > = values in this interval not displayed.   Basic Metabolic Panel: Recent Labs  Lab 10/16/17 0709 10/16/17 0856 10/17/17 0331 10/18/17 0403 10/19/17 0414 10/20/17 0538  NA 141 139 136 135 137 137  K 4.7 4.2 3.5 3.7 3.8 4.0  CL 104 102 96* 97* 96* 98  CO2 24 26 28 25 28 27   GLUCOSE 99 101* 117* 106* 89 145*  BUN 37* 26* 15 22 12 12   CREATININE 5.41* 3.83* 3.32* 4.63* 3.47* 3.01*  CALCIUM 8.8* 8.4* 8.5* 8.7* 8.7* 8.8*  MG 2.0  --   --   --   --   --   PHOS 6.0* 4.2  --   --  3.6  --    GFR: Estimated Creatinine Clearance: 11.2 mL/min (A) (by C-G formula based on SCr of 3.01 mg/dL (H)). Liver Function Tests: Recent Labs  Lab 10/15/17 0935 10/15/17 1515 10/16/17 0856 10/19/17 0414  AST 35 26  --   --   ALT 18 16  --   --   ALKPHOS 94 95  --   --   BILITOT 0.6 0.6  --   --   PROT 8.4* 7.7  --   --   ALBUMIN 2.7* 2.6* 2.5* 2.4*   Recent Labs  Lab 10/15/17 0935  LIPASE 45   No results for input(s): AMMONIA in the last 168 hours. Coagulation Profile: Recent Labs  Lab 10/15/17 1515  INR 1.74   Cardiac Enzymes: Recent Labs  Lab 10/15/17 1515 10/16/17 0022 10/16/17 0709  TROPONINI 1.30* 1.65* 1.33*   BNP (last 3 results) No results for input(s): PROBNP in the last 8760 hours. HbA1C: No results for input(s): HGBA1C in the last 72 hours. CBG: Recent Labs  Lab 10/19/17 2015 10/19/17 2357 10/20/17 0416 10/20/17 0808 10/20/17 1135  GLUCAP 128* 176* 127* 137* 128*   Lipid Profile: No results for input(s): CHOL, HDL, LDLCALC, TRIG, CHOLHDL, LDLDIRECT in the last 72 hours. Thyroid Function Tests: No results for input(s): TSH, T4TOTAL, FREET4, T3FREE, THYROIDAB in the last 72 hours. Anemia Panel: No results  for input(s): VITAMINB12, FOLATE, FERRITIN, TIBC, IRON, RETICCTPCT in the last 72 hours.    Radiology Studies: I have reviewed all of the imaging during this hospital visit personally     Scheduled Meds: . arformoterol  15 mcg Nebulization BID  . aspirin EC  81 mg Oral Daily  . atorvastatin  20 mg Oral q1800  . budesonide (PULMICORT) nebulizer solution  0.5 mg Nebulization BID  . chlorhexidine  15 mL Mouth Rinse BID  . Chlorhexidine Gluconate Cloth  6 each Topical Q0600  . darbepoetin (ARANESP) injection - DIALYSIS  200 mcg Intravenous Q Sat-HD  . diphenhydrAMINE  25 mg Oral Once  . fentaNYL  12.5 mcg Transdermal Q72H  . fentaNYL  25 mcg  Transdermal Q72H  . gabapentin  300 mg Oral BID  . heparin  5,000 Units Subcutaneous Q8H  . insulin aspart  0-9 Units Subcutaneous Q4H  . mouth rinse  15 mL Mouth Rinse q12n4p  . sodium chloride flush  3 mL Intravenous Q12H   Continuous Infusions: . sodium chloride 10 mL/hr at 10/17/17 0600  . sodium chloride Stopped (10/17/17 2100)  . meropenem (MERREM) IV 500 mg (10/19/17 1859)     LOS: 5 days        Zannie CovePreetha Arlo Butt, MD [facility] Page via [email or web address] password [email or web address]

## 2017-10-21 DIAGNOSIS — M79676 Pain in unspecified toe(s): Secondary | ICD-10-CM

## 2017-10-21 LAB — GLUCOSE, CAPILLARY
GLUCOSE-CAPILLARY: 114 mg/dL — AB (ref 70–99)
GLUCOSE-CAPILLARY: 84 mg/dL (ref 70–99)
Glucose-Capillary: 97 mg/dL (ref 70–99)
Glucose-Capillary: 88 mg/dL (ref 70–99)
Glucose-Capillary: 137 mg/dL — ABNORMAL HIGH (ref 70–99)
Glucose-Capillary: 80 mg/dL (ref 70–99)

## 2017-10-21 MED ORDER — GABAPENTIN 600 MG PO TABS
300.0000 mg | ORAL_TABLET | Freq: Every day | ORAL | Status: DC
  Administered 2017-10-22 – 2017-10-26 (×4): 300 mg via ORAL
  Filled 2017-10-21 (×4): qty 1

## 2017-10-21 MED ORDER — DARBEPOETIN ALFA 200 MCG/0.4ML IJ SOSY
PREFILLED_SYRINGE | INTRAMUSCULAR | Status: AC
  Administered 2017-10-21: 200 ug via INTRAVENOUS
  Filled 2017-10-21: qty 0.4

## 2017-10-21 MED ORDER — GABAPENTIN 600 MG PO TABS: 300.0000 mg | ORAL_TABLET | Freq: Every day | ORAL | Status: DC

## 2017-10-21 MED ORDER — LIDOCAINE HCL (PF) 1 % IJ SOLN: 5.0000 mL | INTRAMUSCULAR | Status: DC | PRN

## 2017-10-21 MED ORDER — LIDOCAINE-PRILOCAINE 2.5-2.5 % EX CREA
1.0000 "application " | TOPICAL_CREAM | CUTANEOUS | Status: DC | PRN
  Filled 2017-10-21: qty 5

## 2017-10-21 MED ORDER — SODIUM CHLORIDE 0.9 % IV SOLN: 100.0000 mL | INTRAVENOUS | Status: DC | PRN

## 2017-10-21 MED ORDER — PENTAFLUOROPROP-TETRAFLUOROETH EX AERO: 1.0000 "application " | INHALATION_SPRAY | CUTANEOUS | Status: DC | PRN

## 2017-10-21 MED ORDER — HEPARIN SODIUM (PORCINE) 1000 UNIT/ML DIALYSIS
2000.0000 [IU] | Freq: Once | INTRAMUSCULAR | Status: AC
  Administered 2017-10-21: 2000 [IU] via INTRAVENOUS_CENTRAL

## 2017-10-21 MED ORDER — FENTANYL 25 MCG/HR TD PT72
25.0000 ug | MEDICATED_PATCH | TRANSDERMAL | Status: DC
  Administered 2017-10-21 – 2017-10-27 (×3): 25 ug via TRANSDERMAL
  Filled 2017-10-21 (×2): qty 1

## 2017-10-21 MED ORDER — FENTANYL 25 MCG/HR TD PT72
37.5000 ug | MEDICATED_PATCH | TRANSDERMAL | Status: DC
  Filled 2017-10-21: qty 1

## 2017-10-21 NOTE — Progress Notes (Addendum)
PROGRESS NOTE    Nicole Cox  ZOX:[id number] DOB: [birth year] DOA: 10/15/2017 PCP: Joana Reamer, MD    Brief Narrative:  84 yo female who presented with altered mental status. She does have the significant medical history of ESRD on HD, HTN, CVA, diastolic heart failure, CAD and COPD. Recent hospitalization forAcinetobacter pneumonia. Discharged (07/20) on Imipenem that she did not complete due to declined IV access at the SNF. Head CT without atrophy, no acute changes. Chest x-ray with right lower lobe alveolar/interstitial infiltrate.Patient was admitted to hospital with working diagnosis of right lower lobe pneumonia -Noted to have peripheral vascular disease with gangrenous changes in both feet   Assessment & Plan:   1. Aspiration pneumonia (present on admission).  -has been on IV imipenem from prior to admission, this was changed to meropenem on admission, day 7 now -will discontinue antibiotics today -clinically stable from this standpoint however remains at risk for aspiration due to sedation from narcotics-for ischemic pain -SLP evaluation completed, noted to have dysphagia currently on dysphagia 1 diet with nectar thick liquids  2. Acute metabolic encephalopathy -due to above infection and narcotics -now with excessive sedation and agree with cutting on gabapentin due to ESRD  3. New onset atrial fibrillation. -now in sinus rhythm, heart rate better controlled now -overall prognosis is very poor, added aspirin  4. ESRD on HDwith anemia of chronic renal disease -overall prognosis very poor given ongoing multisystem decline -renal and palliative medicine following  5. COPD with pulmonary hypertension  -stable, contine arfomoterol and budesonide.   6. T2DM.  -stable, continue sliding scale insulin  7. PAD with gangrenous changes in both toes -. Positive tibial artery stenosis on arterial dopplers,   -she is a very poor surgical candidate, VVS following,  continue aspirin and statin -Plan for aortogram on Friday -Agree with titrating down gabapentin and narcotics due to excessive sedation  Ethics: this is a very frail chronically ill patient with ESRD, COPD, A. Fib, dysphagia, aspiration pneumonia, severe peripheral vascular disease with bilateral gangrene -palliative medicine following, now DO NOT RESUSCITATE  DVT prophylaxis:scd Code Status:dnr Family Communication:no family at bedside today, discussed with daughter [name] 8/7 Disposition Plan/ discharge barriers:to be determined based on vascular evaluations and palliative discussions   Consultants:  vascular surgery  Palliative medicine  Procedures:    Antimicrobials: Meropenem 8/2   Subjective: -drowsy this morning, pain a little bit better controlled, poor by mouth intake yesterday.  Objective: Vitals:   10/20/17 2028 10/21/17 0507 10/21/17 0723 10/21/17 1022  BP: (!) 156/56 (!) 150/56  (!) 171/57  Pulse: 90 79  88  Resp: 14 14  18   Temp: 98.8 F (37.1 C) 98.4 F (36.9 C)  98.5 F (36.9 C)  TempSrc: Oral Oral  Oral  SpO2: 100%  97% 98%  Weight:      Height:        Intake/Output Summary (Last 24 hours) at 10/21/2017 1304 Last data filed at 10/21/2017 0900 Gross per 24 hour  Intake 600 ml  Output 0 ml  Net 600 ml   Filed Weights   10/19/17 1409 10/19/17 1648 10/20/17 0420  Weight: 56.9 kg 54 kg 54.5 kg    Examination:   WJX:BJYNWGNFA, drowsy, chronically ill female laying in bed HEENT: Pupils small but reactive Lungs: decreased breath sounds at both bases CVS: RRR,No Gallops,Rubs or new Murmurs Abd: soft, Non tender, non distended, BS present Extremities:  Gangrenous changes in both feet, right greater than left great toe Skin: as above  Data Reviewed: I have personally reviewed following labs and imaging studies  CBC: Recent Labs  Lab 10/15/17 0935 10/15/17 1515  10/16/17 0839 10/17/17 1021 10/18/17 0403 10/19/17 0414  10/20/17 0538  WBC 16.1* 15.5*   < > 11.3* 12.1* 13.4* 16.2* 15.5*  NEUTROABS 13.0* 11.4*  --   --   --  10.1* 12.2* 11.6*  HGB 8.9* 8.7*   < > 7.0* 8.4* 8.8* 9.1* 9.2*  HCT 31.3* 30.7*   < > 24.2* 27.8* 29.7* 30.8* 31.1*  MCV 92.6 93.0   < > 92.7 89.4 88.1 88.0 88.4  PLT 300 256   < > 216 221 256 261 332   < > = values in this interval not displayed.   Basic Metabolic Panel: Recent Labs  Lab 10/16/17 0709 10/16/17 0856 10/17/17 0331 10/18/17 0403 10/19/17 0414 10/20/17 0538  NA 141 139 136 135 137 137  K 4.7 4.2 3.5 3.7 3.8 4.0  CL 104 102 96* 97* 96* 98  CO2 24 26 28 25 28 27   GLUCOSE 99 101* 117* 106* 89 145*  BUN 37* 26* 15 22 12 12   CREATININE 5.41* 3.83* 3.32* 4.63* 3.47* 3.01*  CALCIUM 8.8* 8.4* 8.5* 8.7* 8.7* 8.8*  MG 2.0  --   --   --   --   --   PHOS 6.0* 4.2  --   --  3.6  --    GFR: Estimated Creatinine Clearance: 11.2 mL/min (A) (by C-G formula based on SCr of 3.01 mg/dL (H)). Liver Function Tests: Recent Labs  Lab 10/15/17 0935 10/15/17 1515 10/16/17 0856 10/19/17 0414  AST 35 26  --   --   ALT 18 16  --   --   ALKPHOS 94 95  --   --   BILITOT 0.6 0.6  --   --   PROT 8.4* 7.7  --   --   ALBUMIN 2.7* 2.6* 2.5* 2.4*   Recent Labs  Lab 10/15/17 0935  LIPASE 45   No results for input(s): AMMONIA in the last 168 hours. Coagulation Profile: Recent Labs  Lab 10/15/17 1515  INR 1.74   Cardiac Enzymes: Recent Labs  Lab 10/15/17 1515 10/16/17 0022 10/16/17 0709  TROPONINI 1.30* 1.65* 1.33*   BNP (last 3 results) No results for input(s): PROBNP in the last 8760 hours. HbA1C: No results for input(s): HGBA1C in the last 72 hours. CBG: Recent Labs  Lab 10/20/17 2025 10/21/17 0009 10/21/17 0359 10/21/17 0742 10/21/17 1155  GLUCAP 132* 114* 97 88 137*   Lipid Profile: No results for input(s): CHOL, HDL, LDLCALC, TRIG, CHOLHDL, LDLDIRECT in the last 72 hours. Thyroid Function Tests: No results for input(s): TSH, T4TOTAL, FREET4, T3FREE,  THYROIDAB in the last 72 hours. Anemia Panel: No results for input(s): VITAMINB12, FOLATE, FERRITIN, TIBC, IRON, RETICCTPCT in the last 72 hours.    Radiology Studies: I have reviewed all of the imaging during this hospital visit personally     Scheduled Meds: . arformoterol  15 mcg Nebulization BID  . aspirin EC  81 mg Oral Daily  . atorvastatin  20 mg Oral q1800  . budesonide (PULMICORT) nebulizer solution  0.5 mg Nebulization BID  . chlorhexidine  15 mL Mouth Rinse BID  . Chlorhexidine Gluconate Cloth  6 each Topical Q0600  . darbepoetin (ARANESP) injection - DIALYSIS  200 mcg Intravenous Q Thu-HD  . diphenhydrAMINE  25 mg Oral Once  . fentaNYL  25 mcg Transdermal Q72H  . [START ON 10/22/2017] gabapentin  300 mg Oral QHS  . heparin  5,000 Units Subcutaneous Q8H  . insulin aspart  0-9 Units Subcutaneous Q4H  . mouth rinse  15 mL Mouth Rinse q12n4p  . sodium chloride flush  3 mL Intravenous Q12H   Continuous Infusions: . sodium chloride 10 mL/hr at 10/17/17 0600  . sodium chloride Stopped (10/17/17 2100)  . meropenem (MERREM) IV 500 mg (10/19/17 1859)     LOS: 6 days        Zannie CovePreetha Krystina Strieter, MD [facility] Page via [email or web address] password [email or web address]

## 2017-10-21 NOTE — Progress Notes (Signed)
HD tx initiated via 15Gx2 w/o problem, pull/push/flush well w/o problem, VSS, will cont to monitor while on HD tx 

## 2017-10-21 NOTE — Progress Notes (Signed)
   Plan is for aortogram with possible intervention of the right lower extremity tomorrow.  She will be n.p.o. past midnight.  She does have palpable popliteal pulses suggesting tibial disease which may not be fixable.  All of this is for palliation.  I discussed with the daughter yesterday and she demonstrates good understanding.  Nicole Cox C. Randie Heinzain, MD [facility] [place] Office: [phone] Pager: [phone]

## 2017-10-21 NOTE — Progress Notes (Addendum)
[facility] Progress Note   Subjective: Still C/O R Toe pain. Being followed by Palliative Care for pain management, now a DNR.    Objective Vitals:   10/20/17 2028 10/21/17 0507 10/21/17 0723 10/21/17 1022  BP: (!) 156/56 (!) 150/56  (!) 171/57  Pulse: 90 79  88  Resp: 14 14  18   Temp: 98.8 F (37.1 C) 98.4 F (36.9 C)  98.5 F (36.9 C)  TempSrc: Oral Oral  Oral  SpO2: 100%  97% 98%  Weight:      Height:       Physical Exam General: Frail, elderly female in NAD Heart: S1, S2 2/6 systolic M Lungs: CTAB decreased in bases.  Abdomen: active BS Extremities: No LE edema Dialysis Access: LUA AVG + bruit   Additional Objective Labs: Basic Metabolic Panel: Recent Labs  Lab 10/16/17 0709 10/16/17 0856  10/18/17 0403 10/19/17 0414 10/20/17 0538  NA 141 139   < > 135 137 137  K 4.7 4.2   < > 3.7 3.8 4.0  CL 104 102   < > 97* 96* 98  CO2 24 26   < > 25 28 27   GLUCOSE 99 101*   < > 106* 89 145*  BUN 37* 26*   < > 22 12 12   CREATININE 5.41* 3.83*   < > 4.63* 3.47* 3.01*  CALCIUM 8.8* 8.4*   < > 8.7* 8.7* 8.8*  PHOS 6.0* 4.2  --   --  3.6  --    < > = values in this interval not displayed.   Liver Function Tests: Recent Labs  Lab 10/15/17 0935 10/15/17 1515 10/16/17 0856 10/19/17 0414  AST 35 26  --   --   ALT 18 16  --   --   ALKPHOS 94 95  --   --   BILITOT 0.6 0.6  --   --   PROT 8.4* 7.7  --   --   ALBUMIN 2.7* 2.6* 2.5* 2.4*   Recent Labs  Lab 10/15/17 0935  LIPASE 45   CBC: Recent Labs  Lab 10/16/17 0839 10/17/17 1021 10/18/17 0403 10/19/17 0414 10/20/17 0538  WBC 11.3* 12.1* 13.4* 16.2* 15.5*  NEUTROABS  --   --  10.1* 12.2* 11.6*  HGB 7.0* 8.4* 8.8* 9.1* 9.2*  HCT 24.2* 27.8* 29.7* 30.8* 31.1*  MCV 92.7 89.4 88.1 88.0 88.4  PLT 216 221 256 261 332   Blood Culture    Component Value Date/Time   SDES BLOOD RIGHT HAND 10/15/2017 1515   SPECREQUEST  10/15/2017 1515    BOTTLES DRAWN AEROBIC ONLY Blood Culture results may  not be optimal due to an inadequate volume of blood received in culture bottles   CULT  10/15/2017 1515    NO GROWTH 5 DAYS Performed at [facility], [address] [place], [place] [address]    REPTSTATUS 10/20/2017 FINAL 10/15/2017 1515    Cardiac Enzymes: Recent Labs  Lab 10/15/17 1515 10/16/17 0022 10/16/17 0709  TROPONINI 1.30* 1.65* 1.33*   CBG: Recent Labs  Lab 10/20/17 1741 10/20/17 2025 10/21/17 0009 10/21/17 0359 10/21/17 0742  GLUCAP 123* 132* 114* 97 88   Iron Studies: No results for input(s): IRON, TIBC, TRANSFERRIN, FERRITIN in the last 72 hours. @lablastinr3 @ Studies/Results: No results found. Medications: . sodium chloride 10 mL/hr at 10/17/17 0600  . sodium chloride Stopped (10/17/17 2100)  . meropenem (MERREM) IV 500 mg (10/19/17 1859)   . arformoterol  15 mcg Nebulization  BID  . aspirin EC  81 mg Oral Daily  . atorvastatin  20 mg Oral q1800  . budesonide (PULMICORT) nebulizer solution  0.5 mg Nebulization BID  . chlorhexidine  15 mL Mouth Rinse BID  . Chlorhexidine Gluconate Cloth  6 each Topical Q0600  . darbepoetin (ARANESP) injection - DIALYSIS  200 mcg Intravenous Q Thu-HD  . diphenhydrAMINE  25 mg Oral Once  . fentaNYL  25 mcg Transdermal Q72H  . [START ON 10/22/2017] gabapentin  300 mg Oral Daily  . heparin  5,000 Units Subcutaneous Q8H  . insulin aspart  0-9 Units Subcutaneous Q4H  . mouth rinse  15 mL Mouth Rinse q12n4p  . sodium chloride flush  3 mL Intravenous Q12H     Dialysis: [facility]/ Dr Louis Meckel TTS 3h 121lbs 2/2.5 Hep 2000 then 250 / hr AVG  - mircera150 q 2 weeks- last given 7/25;last hgb at center 8.7 -hect 1 mcg IV q tx  Summary:84 year old chronically ill female with recent sepsis- now returning 2 weeks later with dec MS, elevated WBC and lactate concerning for sepsis.   Assessment/Plan: 1AMS/ Asp PNA/ sepsis- per CCM, on Meropenem. BC NG X 4 days. WBC 15.5 Afebrile past 24 hours. 2  ESRD:normally TTS as OP via AVG.HD today on schedule.  3 Hypertension/Volume:normally very hypertensive on multiplemeds.Had HD 10/18/17 and 10/19/17, pre wt 56.9 kg Net UF 2.0 liters Post wt 54 kg. 1 kg under OP EDW. Lower EDW on DC.  4Anemia of ESRD:HGB 7.0 on adm. Rec'd 1 unit PRBCs 10/16/17. HGB 9.2 today. Rec'd Aranesp 100 mcg IV in HD 10/19/17. Hold Fe load D/T sepsis. 5. Metabolic Bone Disease:Renal function panel added to today's labs. Phos 3.6 Ca 8.8.  6. Decreased MS- possibly due to sepsis vs reported new neurontin, has improved but remained confused, particularly in evenings.  7. Afib-SR on monitor. Amiodarone DC'd. No anticoagulation. 8.R Great Toe Necrosis-  dry gangrene to tip of R big toes, per primary- LE dopplers done yesterday. VVS consulted. For Aortagram per Dr. Randie Heinz 08/08 today  9. GOC-palliative care consult. Patient now DNR. Wishes to continue hemodialysis.  Pt very frail w/ very poor prognosis overall. Have d/w family and PC team yesterday.  Will follow.   Rita H. Brown NP-C 10/21/2017, 11:56 AM  [facility] [phone]  Pt seen, examined and agree w A/P as above.  Vinson Moselle MD [employer] pager [phone]   10/21/2017, 12:09 PM

## 2017-10-21 NOTE — Progress Notes (Signed)
Daily Progress Note   Patient Name: Nicole Cox       Date: 10/21/2017 DOB: [birth year]  Age: 84 y.o. MRN#: [id number] Attending Physician: Zannie CoveJoseph, Preetha, MD Primary Care Physician: Nicole Reamerarr, J Stewart, MD Admit Date: 10/15/2017  Reason for Consultation/Follow-up: Establishing goals of care, Pain control and Psychosocial/spiritual support  Subjective: Patient states she slept well and was not having pain this morning.  She asks questions about the DNR band. Her daughter Nicole Cox is at bedside and has been talking to Nicole Cox about the DNR band.  Nicole Cox is upset about the DNR band.  I explained to Nicole Cox that the DNR keeps her safe from harm at the end of her life.  She seemed comfortable with this explanation.  Nicole Cox tells me that Nicole Cox is very important to her.  It's obvious that Nicole Cox is concerned about Nicole Cox's feelings.  I talked with daughter Nicole Cox out in the hallway.  Nicole Cox feels it is very important for Nakisha to understand exactly what DNR means and that she will not be resuscitated.  She strongly wants to honor Nicole Cox's wishes.  Nicole Cox is upset that her sister Nicole Cox HCPOA changed the code status to DNR.  I attempted to explain that our goal is to "first do no harm" and that coding Nicole Cox at the end of her life would only bring harm to her.  It would not restore her life or her health.  My words were not helpful to Nicole Cox.  I am certain that she is not comfortable with the idea of her "Mother" (actually great great aunt) dying.  I talked with Nicole Cox about her symptoms and about being visited by the [place].  She stated, "I would love to see the Chaplain!"   Assessment:  Patient appears more comfortable today with addition of PRN oral dilaudid and gabapentin.    Patient  Profile/HPI:  84 y.o.femalewith past medical history of ESRD, atrial fibrillation, CVA, CHF, DM, COPDwho was admitted on 8/2/2019with aspiration pneumonia, afib, and encephalopathy. The patient experiences severe pain in her great toe due to dry gangrene.She has been evaluated by speech who determined her cognitive deficits make her a high risk for aspiration. She developed rapid afib in hemodialysis on 8/4 and was unable to finish her treatment. An additional hemodialysis session was scheduled.   Length of Stay: 6  Current  Medications: Scheduled Meds:  . arformoterol  15 mcg Nebulization BID  . aspirin EC  81 mg Oral Daily  . atorvastatin  20 mg Oral q1800  . budesonide (PULMICORT) nebulizer solution  0.5 mg Nebulization BID  . chlorhexidine  15 mL Mouth Rinse BID  . Chlorhexidine Gluconate Cloth  6 each Topical Q0600  . darbepoetin (ARANESP) injection - DIALYSIS  200 mcg Intravenous Q Thu-HD  . diphenhydrAMINE  25 mg Oral Once  . fentaNYL  25 mcg Transdermal Q72H  . [START ON 10/22/2017] gabapentin  300 mg Oral Daily  . heparin  5,000 Units Subcutaneous Q8H  . insulin aspart  0-9 Units Subcutaneous Q4H  . mouth rinse  15 mL Mouth Rinse q12n4p  . sodium chloride flush  3 mL Intravenous Q12H    Continuous Infusions: . sodium chloride 10 mL/hr at 10/17/17 0600  . sodium chloride Stopped (10/17/17 2100)  . meropenem (MERREM) IV 500 mg (10/19/17 1859)    PRN Meds: sodium chloride, sodium chloride, acetaminophen, HYDROmorphone, RESOURCE THICKENUP CLEAR, sodium chloride flush  Physical Exam        Elderly chronically ill female, awake, alert, easily confused/influenced, childlike Resp NAD   Vital Signs: BP (!) 171/57 (BP Location: Right Arm)   Pulse 88   Temp 98.5 F (36.9 C) (Oral)   Resp 18   Ht 5\' 2"  (1.575 m)   Wt 54.5 kg   SpO2 98%   BMI 21.98 kg/m  SpO2: SpO2: 98 % O2 Device: O2 Device: Nasal Cannula O2 Flow Rate: O2 Flow Rate (L/min): 2  L/min  Intake/output summary:   Intake/Output Summary (Last 24 hours) at 10/21/2017 1113 Last data filed at 10/21/2017 0600 Gross per 24 hour  Intake 360 ml  Output 0 ml  Net 360 ml   LBM: Last BM Date: 10/19/17 Baseline Weight: Weight: 59.4 kg Most recent weight: Weight: 54.5 kg       Palliative Assessment/Data:30%    Flowsheet Rows     Most Recent Value  Intake Tab  Referral Department  Hospitalist  Unit at Time of Referral  ICU  Date Notified  10/16/17  Palliative Care Type  Return patient Palliative Care  Reason for referral  Clarify Goals of Care  Date of Admission  10/15/17  Date first seen by Palliative Care  10/18/17  # of days Palliative referral response time  2 Day(s)  # of days IP prior to Palliative referral  1  Clinical Assessment  Psychosocial & Spiritual Assessment  Palliative Care Outcomes      Patient Active Problem List   Diagnosis Date Noted  . DNR (do not resuscitate)   . Oropharyngeal dysphagia   . Altered mental status   . ESRD on dialysis (HCC)   . Persistent atrial fibrillation (HCC)   . Dry gangrene (HCC)   . Sepsis (HCC) 10/15/2017  . Delirium 09/25/2017  . Bacteremia   . SIRS (systemic inflammatory response syndrome) (HCC) 07/27/2017  . Malnutrition of moderate degree 06/10/2017  . Pressure injury of skin 06/08/2017  . Acute pulmonary edema (HCC) 06/07/2017  . Respiratory failure with hypoxia (HCC) 12/12/2015  . Acute on chronic respiratory failure with hypoxia (HCC) 12/12/2015  . Fluid overload 12/12/2015  . Palliative care encounter   . Acute respiratory failure with hypoxia (HCC) 08/13/2015  . ESRD on hemodialysis (HCC) 08/13/2015  . Hypertensive urgency 08/13/2015  . Chronic anemia 08/13/2015  . Acute respiratory failure (HCC) 08/13/2015  . HCAP (healthcare-associated pneumonia) 08/11/2012  . Hypoxia 08/11/2012  .  Hypertension 08/11/2012  . DM (diabetes mellitus), type 2 with renal complications (HCC) 08/11/2012  . C1  cervical fracture (HCC) 08/11/2012  . Abnormal MRI, thoracic spine 08/11/2012  . COPD (chronic obstructive pulmonary disease) (HCC) 08/11/2012  . CAD (coronary artery disease) 08/11/2012  . Fall 08/11/2012    Palliative Care Plan    Recommendations/Plan:  Fentanyl patch was not increased to 37 mcg but patient appears to have decreased pain today.  Keep fentanyl patch at 25 mcg.  Gabapentin for neuropathic pain 300 mg daily (due to renal considerations)  Oral dilaudid 2 mg PRN for break thru pain.  Patient is DNR per [facility].  This decision is in line with recommendations from Palliative, Internal Medicine and Nephrology.  HCPOA agreeable to vascular procedure and possible therapeutic intervention on Friday.  She does not support surgical intervention.  Goals of Care and Additional Recommendations:  Limitations on Scope of Treatment: Full Scope Treatment, but no surgical interventions.  Code Status:  DNR  Prognosis:   < 6 months  given rapid decline, recurrent aspiration, multiple medical problems, gangrenous foot which will be managed conservatively without surgery.   Discharge Planning:  Skilled Nursing Facility for rehab with Palliative care service follow-up  Care plan was discussed with patient, Demaris Callander, nephrology  Thank you for allowing the Palliative Medicine Team to assist in the care of this patient.  Total time spent:  35 min     Greater than 50%  of this time was spent counseling and coordinating care related to the above assessment and plan.  Norvel Richards, PA-C Palliative Medicine  Please contact Palliative MedicineTeam phone at [phone] for questions and concerns between 7 am - 7 pm.   Please see AMION for individual provider pager numbers.

## 2017-10-21 NOTE — Progress Notes (Signed)
HD tx completed @ 2215 w/ one episode of soft (not low) bp that was resolved by UF off for 15 min and NS bolus admin, UF goal still met, blood rinsed back, VSS, pt pain also expressed when report called to Rockie Neighbours, RN

## 2017-10-22 ENCOUNTER — Encounter (HOSPITAL_COMMUNITY)
Admission: EM | Disposition: A | Discharge status: Skilled Nursing Facility | Payer: Self-pay | Source: Skilled Nursing Facility | Attending: Internal Medicine

## 2017-10-22 HISTORY — PX: PERIPHERAL VASCULAR INTERVENTION: CATH118257

## 2017-10-22 HISTORY — PX: LOWER EXTREMITY ANGIOGRAPHY: CATH118251

## 2017-10-22 LAB — GLUCOSE, CAPILLARY
GLUCOSE-CAPILLARY: 88 mg/dL (ref 70–99)
GLUCOSE-CAPILLARY: 74 mg/dL (ref 70–99)
GLUCOSE-CAPILLARY: 90 mg/dL (ref 70–99)
Glucose-Capillary: 110 mg/dL — ABNORMAL HIGH (ref 70–99)
Glucose-Capillary: 83 mg/dL (ref 70–99)
Glucose-Capillary: 137 mg/dL — ABNORMAL HIGH (ref 70–99)

## 2017-10-22 LAB — CBC
HCT: 32.4 % — ABNORMAL LOW (ref 36.0–46.0)
Hemoglobin: 9.5 g/dL — ABNORMAL LOW (ref 12.0–15.0)
MCH: 26.2 pg (ref 26.0–34.0)
MCHC: 29.3 g/dL — ABNORMAL LOW (ref 30.0–36.0)
MCV: 89.3 fL (ref 78.0–100.0)
PLATELETS: 328 10*3/uL (ref 150–400)
RBC: 3.63 MIL/uL — AB (ref 3.87–5.11)
RDW: 20 % — ABNORMAL HIGH (ref 11.5–15.5)
WBC: 13 10*3/uL — AB (ref 4.0–10.5)

## 2017-10-22 LAB — BASIC METABOLIC PANEL
Anion gap: 12 (ref 5–15)
BUN: 6 mg/dL — AB (ref 8–23)
CO2: 31 mmol/L (ref 22–32)
Calcium: 8.6 mg/dL — ABNORMAL LOW (ref 8.9–10.3)
Chloride: 95 mmol/L — ABNORMAL LOW (ref 98–111)
Creatinine, Ser: 2.76 mg/dL — ABNORMAL HIGH (ref 0.44–1.00)
GFR, EST AFRICAN AMERICAN: 17 mL/min — AB (ref >60)
GFR, EST NON AFRICAN AMERICAN: 15 mL/min — AB (ref >60)
Glucose, Bld: 76 mg/dL (ref 70–99)
POTASSIUM: 4.5 mmol/L (ref 3.5–5.1)
SODIUM: 138 mmol/L (ref 135–145)

## 2017-10-22 LAB — POCT ACTIVATED CLOTTING TIME
ACTIVATED CLOTTING TIME: 208 s
Activated Clotting Time: 208 seconds
Activated Clotting Time: 191 seconds
Activated Clotting Time: 180 seconds

## 2017-10-22 SURGERY — LOWER EXTREMITY ANGIOGRAPHY
Anesthesia: LOCAL | Laterality: Right

## 2017-10-22 MED ORDER — HEPARIN (PORCINE) IN NACL 1000-0.9 UT/500ML-% IV SOLN
INTRAVENOUS | Status: DC | PRN
  Administered 2017-10-22 (×2): 500 mL

## 2017-10-22 MED ORDER — FENTANYL CITRATE (PF) 100 MCG/2ML IJ SOLN
INTRAMUSCULAR | Status: AC
  Filled 2017-10-22: qty 2

## 2017-10-22 MED ORDER — LIDOCAINE HCL (PF) 1 % IJ SOLN
INTRAMUSCULAR | Status: AC
  Filled 2017-10-22: qty 30

## 2017-10-22 MED ORDER — IODIXANOL 320 MG/ML IV SOLN
INTRAVENOUS | Status: DC | PRN
  Administered 2017-10-22: 150 mL via INTRA_ARTERIAL

## 2017-10-22 MED ORDER — LABETALOL HCL 5 MG/ML IV SOLN
10.0000 mg | INTRAVENOUS | Status: AC | PRN
  Administered 2017-10-22 – 2017-10-26 (×4): 10 mg via INTRAVENOUS
  Filled 2017-10-22 (×3): qty 4

## 2017-10-22 MED ORDER — LABETALOL HCL 5 MG/ML IV SOLN
INTRAVENOUS | Status: AC
  Filled 2017-10-22: qty 4

## 2017-10-22 MED ORDER — LIDOCAINE HCL (PF) 1 % IJ SOLN
INTRAMUSCULAR | Status: DC | PRN
  Administered 2017-10-22: 20 mL via INTRADERMAL

## 2017-10-22 MED ORDER — HEPARIN SODIUM (PORCINE) 1000 UNIT/ML IJ SOLN
INTRAMUSCULAR | Status: DC | PRN
  Administered 2017-10-22: 6000 [IU] via INTRAVENOUS
  Administered 2017-10-22: 1000 [IU] via INTRAVENOUS

## 2017-10-22 MED ORDER — DEXTROSE 50 % IV SOLN
INTRAVENOUS | Status: AC
  Administered 2017-10-22: 25 mL
  Filled 2017-10-22: qty 50

## 2017-10-22 MED ORDER — ONDANSETRON HCL 4 MG/2ML IJ SOLN: 4.0000 mg | Freq: Four times a day (QID) | INTRAMUSCULAR | Status: DC | PRN

## 2017-10-22 MED ORDER — HYDRALAZINE HCL 20 MG/ML IJ SOLN
5.0000 mg | INTRAMUSCULAR | Status: AC | PRN
  Administered 2017-10-23 – 2017-10-25 (×2): 5 mg via INTRAVENOUS
  Filled 2017-10-22 (×2): qty 1

## 2017-10-22 MED ORDER — SODIUM CHLORIDE 0.9 % IV SOLN: 250.0000 mL | INTRAVENOUS | Status: DC | PRN

## 2017-10-22 MED ORDER — FENTANYL CITRATE (PF) 100 MCG/2ML IJ SOLN
INTRAMUSCULAR | Status: DC | PRN
  Administered 2017-10-22 (×2): 25 ug via INTRAVENOUS

## 2017-10-22 MED ORDER — SODIUM CHLORIDE 0.9% FLUSH
3.0000 mL | Freq: Two times a day (BID) | INTRAVENOUS | Status: DC
  Administered 2017-10-23 – 2017-10-27 (×7): 3 mL via INTRAVENOUS

## 2017-10-22 MED ORDER — HEPARIN (PORCINE) IN NACL 1000-0.9 UT/500ML-% IV SOLN
INTRAVENOUS | Status: AC
  Filled 2017-10-22: qty 1000

## 2017-10-22 MED ORDER — SODIUM CHLORIDE 0.9% FLUSH: 3.0000 mL | INTRAVENOUS | Status: DC | PRN

## 2017-10-22 SURGICAL SUPPLY — 23 items
CATH CROSS OVER TEMPO 5F (CATHETERS) ×3 IMPLANT
CATH OMNI FLUSH 5F 65CM (CATHETERS) ×3 IMPLANT
CATH QUICKCROSS .035X135CM (MICROCATHETER) ×3 IMPLANT
CATH QUICKCROSS ANG SELECT (CATHETERS) ×3 IMPLANT
CATH SOFT-VU 4F 65 STRAIGHT (CATHETERS) ×2 IMPLANT
CATH SOFT-VU STRAIGHT 4F 65CM (CATHETERS) ×1
DEVICE TORQUE .025-.038 (MISCELLANEOUS) ×3 IMPLANT
FILTER CO2 0.2 MICRON (VASCULAR PRODUCTS) ×3 IMPLANT
GUIDEWIRE ANGLED .035X260CM (WIRE) ×3 IMPLANT
KIT PV (KITS) ×3 IMPLANT
RESERVOIR CO2 (VASCULAR PRODUCTS) ×3 IMPLANT
SET FLUSH CO2 (MISCELLANEOUS) ×3 IMPLANT
SHEATH PINNACLE 5F 10CM (SHEATH) ×3 IMPLANT
SHEATH PINNACLE MP 6F 45CM (SHEATH) ×3 IMPLANT
SHEATH PROBE COVER 6X72 (BAG) ×3 IMPLANT
STOPCOCK MORSE 400PSI 3WAY (MISCELLANEOUS) ×3 IMPLANT
SYRINGE MEDRAD AVANTA MACH 7 (SYRINGE) ×3 IMPLANT
TRANSDUCER W/STOPCOCK (MISCELLANEOUS) ×3 IMPLANT
TRAY PV CATH (CUSTOM PROCEDURE TRAY) ×3 IMPLANT
TUBING CIL FLEX 10 FLL-RA (TUBING) ×3 IMPLANT
WIRE BENTSON .035X145CM (WIRE) ×3 IMPLANT
WIRE G V18X300CM (WIRE) ×3 IMPLANT
WIRE ROSEN-J .035X180CM (WIRE) ×3 IMPLANT

## 2017-10-22 NOTE — Interval H&P Note (Signed)
Addendum  After reviewing the chart, the patient is already on ESRD-HD, so full contrast will be used.  Leonides SakeBrian Merwyn Hodapp, MD, FACS [facility] [place] Office: [phone] Pager: [phone]  10/22/2017, 9:52 AM

## 2017-10-22 NOTE — Progress Notes (Addendum)
Site area: LFA Site Prior to Removal:  Level  Pressure Applied For:30 min Manual:   yes Patient Status During Pull:  stable Post Pull Site:  Level 0 Post Pull Instructions Given: yes   Post Pull Pulses Present: doppler Dressing Applied:  clear Bedrest begins @ 1435 till 1835 Comments:

## 2017-10-22 NOTE — Progress Notes (Addendum)
[facility] Progress Note   Subjective:    Feeling ok post procedure.  Objective Vitals:   10/22/17 1430 10/22/17 1435 10/22/17 1440 10/22/17 1515  BP: (!) 154/36 (!) 157/41    Pulse: 63 68 62   Resp: 10 17 14    Temp:      TempSrc:      SpO2: 100% 100% 100%   Weight:      Height:    5\' 2"  (1.575 m)   Physical Exam General:NAD, frail, elderly female Heart:RRR Lungs:nml WOB Abdomen:soft, NTND Extremities:no LE edema Dialysis Access: LUA AVG +b/t   Filed Weights   10/21/17 1908 10/21/17 2245 10/21/17 2323  Weight: 58.1 kg 56.6 kg 55.3 kg    Intake/Output Summary (Last 24 hours) at 10/22/2017 1545 Last data filed at 10/22/2017 0018 Gross per 24 hour  Intake 3 ml  Output 1446 ml  Net -1443 ml    Additional Objective Labs: Basic Metabolic Panel: Recent Labs  Lab 10/16/17 0709 10/16/17 0856  10/19/17 0414 10/20/17 0538 10/22/17 0449  NA 141 139   < > 137 137 138  K 4.7 4.2   < > 3.8 4.0 4.5  CL 104 102   < > 96* 98 95*  CO2 24 26   < > 28 27 31   GLUCOSE 99 101*   < > 89 145* 76  BUN 37* 26*   < > 12 12 6*  CREATININE 5.41* 3.83*   < > 3.47* 3.01* 2.76*  CALCIUM 8.8* 8.4*   < > 8.7* 8.8* 8.6*  PHOS 6.0* 4.2  --  3.6  --   --    < > = values in this interval not displayed.   Liver Function Tests: Recent Labs  Lab 10/16/17 0856 10/19/17 0414  ALBUMIN 2.5* 2.4*   CBC: Recent Labs  Lab 10/17/17 1021 10/18/17 0403 10/19/17 0414 10/20/17 0538 10/22/17 0449  WBC 12.1* 13.4* 16.2* 15.5* 13.0*  NEUTROABS  --  10.1* 12.2* 11.6*  --   HGB 8.4* 8.8* 9.1* 9.2* 9.5*  HCT 27.8* 29.7* 30.8* 31.1* 32.4*  MCV 89.4 88.1 88.0 88.4 89.3  PLT 221 256 261 332 328   Blood Culture    Component Value Date/Time   SDES BLOOD RIGHT HAND 10/15/2017 1515   SPECREQUEST  10/15/2017 1515    BOTTLES DRAWN AEROBIC ONLY Blood Culture results may not be optimal due to an inadequate volume of blood received in culture bottles   CULT  10/15/2017 1515    NO GROWTH  5 DAYS Performed at [facility], [address] [place], [place] [address]    REPTSTATUS 10/20/2017 FINAL 10/15/2017 1515    Cardiac Enzymes: Recent Labs  Lab 10/16/17 0022 10/16/17 0709  TROPONINI 1.65* 1.33*   CBG: Recent Labs  Lab 10/21/17 2319 10/22/17 0342 10/22/17 0735 10/22/17 0923 10/22/17 1143  GLUCAP 84 88 74 110* 83    Lab Results  Component Value Date   INR 1.74 10/15/2017   INR 1.09 07/27/2017   Studies/Results: No results found.  Medications: . sodium chloride 10 mL/hr at 10/17/17 0600  . sodium chloride Stopped (10/17/17 2100)  . sodium chloride    . sodium chloride    . sodium chloride     . arformoterol  15 mcg Nebulization BID  . aspirin EC  81 mg Oral Daily  . atorvastatin  20 mg Oral q1800  . budesonide (PULMICORT) nebulizer solution  0.5 mg Nebulization BID  . chlorhexidine  15 mL  Mouth Rinse BID  . Chlorhexidine Gluconate Cloth  6 each Topical Q0600  . darbepoetin (ARANESP) injection - DIALYSIS  200 mcg Intravenous Q Thu-HD  . diphenhydrAMINE  25 mg Oral Once  . fentaNYL  25 mcg Transdermal Q72H  . gabapentin  300 mg Oral QHS  . heparin  5,000 Units Subcutaneous Q8H  . insulin aspart  0-9 Units Subcutaneous Q4H  . mouth rinse  15 mL Mouth Rinse q12n4p  . sodium chloride flush  3 mL Intravenous Q12H  . sodium chloride flush  3 mL Intravenous Q12H    Dialysis Orders: [facility]/ Dr Louis Meckel TTS 3h 121lbs 2/2.5 Hep 2000 then 250 / hr AVG  - mircera150 q 2 weeks- last given 7/25;last hgb at center 8.7 -hect 1 mcg IV q tx  Summary: 85yo chronically ill female w/recent sepsis - now returning 2 weeks later w/dec MS, elevated WBC and lactate concerning for sepsis.   Assessment/Plan: 1. AMS/Asp PNA/sepsis - per CCM, on Meropenem.  BC NGx5d. WBC 13.0. Afebrile.  2. ESRD - TTS HD. K 4.5. Orders written for HD tomorrow per regular schedule.  3. Anemia of CKD- Hgb 9.5 s/p 1 unit pRBC on 8/3.  Aranesp qwk (Tues)    Holding Fe d/t infection.  4. Metabolic Bone Disease  - Ca 8.6. Last Phos in goal.  5. HTN/volume - BP variable.  Better post HD.  Under EDW, will need lower edw at d/c.   6. Nutrition - Alb 2.4. Renal diet w/fluid restrictions. Renavite. Nepro.   7. Decreased mental status - slightly improved. Sepsis vs Neurontin.  Still with come confusion especially in the evenings. 8. A fib - currently NSR.  9. R Great Toe Necrosis - dry gangrene of top of R toes. s/p aortogram finding diffuse calcification, w/failed attempt at R tibial recannulation on 8/9 by Dr. Imogene Burn 10. GOC - palliative care consult. Now DNR. Wishes to continue HD. Frail w/poor prognosis overall.    Virgina Norfolk, PA-C [facility] Pager: [phone] 10/22/2017,3:45 PM  LOS: 7 days   Pt seen, examined and agree w A/P as above.  Nicole Moselle MD [employer] pager [phone]   10/22/2017, 5:03 PM

## 2017-10-22 NOTE — Op Note (Signed)
OPERATIVE NOTE   PROCEDURE: 1.  left common femoral artery cannulation under ultrasound guidance 2.  Placement of catheter in aorta 3.  Aortogram 4.  Bilateral leg runoff via catheter in aorta 5.  Failed attempt at right tibial recannulation  PRE-OPERATIVE DIAGNOSIS: right great toe gangrene  POST-OPERATIVE DIAGNOSIS: same as above   SURGEON: Leonides Sake, MD  ANESTHESIA: conscious sedation  ESTIMATED BLOOD LOSS: 50 cc  CONTRAST: 150 cc  FINDING(S):  Diffuse calcific atherosclerosis throughout  Aorta: patent  Superior mesenteric artery: not visualized Celiac artery: not visualized   Right Left  RA Patent, distal occluded Patent, distal occluded  CIA patent patent  EIA patent patent  IIA Patent, 50% proximal stenosis Patent, proximal 50-75% stenosis  CFA patent patent  SFA Patent, distal serial stenoses 50% Patent, diseased diffusely, mid-segment 50-75% stenosis, distally 50-75% and 50% stenosis  PFA Patent Patent  Pop Patent, 50-75% at knee stenosis Patent, 50-75% stenosis in proximal segment  Trif Patent proximally with mid-segment sub-total occlusion Patent  AT Patent proximally, serial occlusions with reconstituted islands Patent, dominant runoff  Pero Occluded proximally, reconstitutes from collaterals and splits distally as the only runoff into the foot Patent with serial sub-total occlusions x 3   PT Occluded proximally, collateral feeds mid-segment reconstitution which does not obviously runoff into the foot Occluded   SPECIMEN(S):  none  INDICATIONS:   Nicole Cox is a 84 y.o. female who presents with right great toe gangrene.  The patient presents for: aortogram, bilateral leg runoff, and right leg intervention.  I discussed with the patient the nature of angiographic procedures, especially the limited patencies of any endovascular intervention.  The patient is aware of that the risks of an angiographic procedure include but are not limited to: bleeding,  infection, access site complications, renal failure, embolization, rupture of vessel, dissection, possible need for emergent surgical intervention, possible need for surgical procedures to treat the patient's pathology, and stroke and death.  The patient is aware of the risks and agrees to proceed.  DESCRIPTION: After full informed consent was obtained from the patient, the patient was brought back to the angiography suite.  The patient was placed supine upon the angiography table and connected to cardiopulmonary monitoring equipment.  The patient was then given conscious sedation, the amounts of which are documented in the patient's chart.  A circulating radiologic technician maintained continuous monitoring of the patient's cardiopulmonary status.  Additionally, the control room radiologic technician provided backup monitoring throughout the procedure.  The patient was prepped and drape in the standard fashion for an angiographic procedure.  At this point, attention was turned to the left groin.  Under ultrasound guidance,  The subcutaneous tissue surrounding the left common femoral artery was anesthesized with 1% lidocaine with epinephrine.  Under Sonosite guidance, the patency of the artery was noted to be: circumferential mildly calcified.  Utrasound image was permanently recorded.  The artery was then cannulated with a 18 gauge needle.  The Bentson wire was passed up into the aorta.  The needle was exchanged for a 5-Fr sheath, which was advanced over the wire into the common femoral artery.  The dilator was then removed.  The Omniflush catheter was then loaded over the wire up to the level of L1.  The catheter was connected to the power injector circuit.  After de-airring and de-clotting the circuit, a power injector aortogram was completed.  The findings are listed above.  I then pulled the catheter into the distal aorta and did  bilateral leg runoffs via the catheter in the distal aorta.  The findings  are listed above.  Given her right great toe gangrene, I felt an attempt at intervention was indicated.  The wire was exchanged for a Rosen wire.  The patient's left femoral sheath was exchanged for a 6-Fr Destination sheath, which was lodged in the right common femoral artery.  The dilator was removed.  The patient was given 6000 units of Heparin intravenously, which was a therapeutic bolus.  In total, 7000 units of Heparin was administrated to achieve and maintain a therapeutic level of anticoagulation.  I loaded a Quickcross and long Glidewire, using this combination, I cross the calcific atherosclerosis in this patient's femoropopliteal artery.  I did a hand injection from the level of the tibial plateau.  This demonstrated the above findings.  There appeared to be an aberrant anterior tibial artery/peroneal take off.  I switched the wire to a V-18 and then using the Quickcross catheter and V-18 selected this AT/peroneal artery and was able to advance past the first occlusion.  The 0.035" Quickcross catheter would not track so I exchanged the Select 0.018" catheter.  This catheter also would not cross the calcific disease.  Unfortunately, if this catheter would not pass the occlusion, no balloon on the market would cross the disease.  I pulled the wire and catheter back into the below-the-knee popliteal artery.  I then selected the posterior tibial artery.  This appeared to be primarily a collateral that filled the posterior tibial artery.  Again, I could pass the wire but the catheter would not track.  I removed the catheter and wire and then pulled the sheath back into the left external iliac artery.  The sheath was aspirated.  No clots were present and the sheath was reloaded with heparinized saline.    The images will be reviewed with Dr. Randie Heinzain for determination of the plan.   COMPLICATIONS: none  CONDITION: stable   Leonides SakeBrian Dauna Ziska, MD, [facility] [place] Office:  [phone] Pager: [phone]  10/22/2017, 11:25 AM

## 2017-10-22 NOTE — Interval H&P Note (Signed)
   History and Physical Update  The patient was interviewed and re-examined.  The patient's previous History and Physical has been reviewed and is unchanged from Dr. Darcella Cheshireain's consult.  There is no change in the plan of care: aortogram with carbon dioxide, right leg runoff and possible right leg intervention.   I discussed with the patient the nature of angiographic procedures, especially the limited patencies of any endovascular intervention.    The patient and family is aware of that the risks of an angiographic procedure include but are not limited to: bleeding, infection, access site complications, renal failure, embolization, rupture of vessel, dissection, arteriovenous fistula, possible need for emergent surgical intervention, possible need for surgical procedures to treat the patient's pathology, anaphylactic reaction to contrast, and stroke and death.    Patient and family is at increased risk of going into end stage renal disease as she already is nearly chronic kidney disease stage IV.  Will try to attenuate the risk with carbon dioxide but any intervention   The patient and family is aware of the risks and agrees to proceed.   Leonides SakeBrian Chen, MD, FACS [facility] [place] Office: [phone] Pager: [phone]  10/22/2017, 9:25 AM

## 2017-10-22 NOTE — Progress Notes (Signed)
OR called stated consent signed previously had not been witnessed and was signed but pt's sister.  Daughter, Nicole Cox, is pt's POA.  Called daughter Nicole Cox, [place], and obtained telephone consent for arteriogram witnessed by myself Terrance Massina Kc Summerson, RN and Antoine PrimasJay Naramades, RN for procedure.

## 2017-10-22 NOTE — Progress Notes (Signed)
Pt CBG this am 74.  Advised Dr. Jomarie LongsJoseph on floor V/O ok to give 12.5 g D50.  Pt is NPO for artieriogram.

## 2017-10-22 NOTE — Progress Notes (Signed)
PROGRESS NOTE    [name]  ZOX:[id number] DOB: April 10, 1931 DOA: 10/15/2017 PCP: [name], J Stewart, MD    Brief Narrative:  84 yo female who presented with altered mental status. She does have the significant medical history of ESRD on HD, HTN, CVA, diastolic heart failure, CAD and COPD. Recent hospitalization forAcinetobacter pneumonia. Discharged (07/20) on Imipenem that she did not complete due to declined IV access at the SNF. Head CT without atrophy, no acute changes. Chest x-ray with right lower lobe alveolar/interstitial infiltrate.Patient was admitted to hospital with working diagnosis of right lower lobe pneumonia -Noted to have peripheral vascular disease with gangrenous changes in both feet   Assessment & Plan:   1. Aspiration pneumonia (present on admission).  -has been on IV imipenem from prior to admission, this was changed to meropenem on admission,  -completed 7day course, Abx Stopped - remains at risk for aspiration due to sedation from narcotics-for ischemic pain -SLP evaluation completed, noted to have dysphagia currently on dysphagia 1 diet with nectar thick liquids -stable and improved now  2. Acute metabolic encephalopathy -due to above infection and narcotics -some degree of sedation from narcotics  3. New onset atrial fibrillation. -now in sinus rhythm, heart rate better controlled now -overall prognosis is very poor, added aspirin  4. ESRD on HDwith anemia of chronic renal disease -overall prognosis very poor given ongoing multisystem decline -renal and palliative medicine following  5. COPD with pulmonary hypertension  -stable, contine arfomoterol and budesonide.   6. T2DM.  -stable, continue sliding scale insulin  7. PAD with gangrenous changes in both toes -Positive tibial artery stenosis on arterial dopplers,   -she is a very poor surgical candidate, VVS following, continue aspirin and statin -Plan for aortogram today -continue  narcotics  Ethics: this is a very frail chronically ill patient with ESRD, COPD, A. Fib, dysphagia, aspiration pneumonia, severe peripheral vascular disease with bilateral gangrene -palliative medicine following, now DO NOT RESUSCITATE  DVT prophylaxis:scd Code Status:dnr Family Communication:no family at bedside today, discussed with daughter Nicole Cox 8/7, 8/8 Disposition Plan/ discharge barriers:to be determined based on vascular evaluations and palliative discussions   Consultants:  vascular surgery  Palliative medicine  Procedures:    Antimicrobials: Meropenem 8/2   Subjective: -drowsy this morning, pain a little bit better controlled, poor by mouth intake yesterday.  Objective: Vitals:   10/22/17 1425 10/22/17 1430 10/22/17 1435 10/22/17 1440  BP: (!) 164/39 (!) 154/36 (!) 157/41   Pulse: 65 63 68 62  Resp: 12 10 17 14   Temp:      TempSrc:      SpO2: 100% 100% 100% 100%  Weight:      Height:        Intake/Output Summary (Last 24 hours) at 10/22/2017 1516 Last data filed at 10/22/2017 0018 Gross per 24 hour  Intake 3 ml  Output 1446 ml  Net -1443 ml   Filed Weights   10/21/17 1908 10/21/17 2245 10/21/17 2323  Weight: 58.1 kg 56.6 kg 55.3 kg    Examination:   WJX:BJYNWGNFAGen:somnolent, drowsy, chronically ill female laying in bed HEENT: Pupils small but reactive Lungs: decreased breath sounds at both bases CVS: RRR,No Gallops,Rubs or new Murmurs Abd: soft, Non tender, non distended, BS present Extremities:  Gangrenous changes in both feet, right greater than left great toe Skin: as above  Data Reviewed: I have personally reviewed following labs and imaging studies  CBC: Recent Labs  Lab 10/17/17 1021 10/18/17 0403 10/19/17 0414 10/20/17 0538 10/22/17 0449  WBC 12.1* 13.4* 16.2* 15.5* 13.0*  NEUTROABS  --  10.1* 12.2* 11.6*  --   HGB 8.4* 8.8* 9.1* 9.2* 9.5*  HCT 27.8* 29.7* 30.8* 31.1* 32.4*  MCV 89.4 88.1 88.0 88.4 89.3  PLT 221 256  261 332 328   Basic Metabolic Panel: Recent Labs  Lab 10/16/17 0709 10/16/17 0856 10/17/17 0331 10/18/17 0403 10/19/17 0414 10/20/17 0538 10/22/17 0449  NA 141 139 136 135 137 137 138  K 4.7 4.2 3.5 3.7 3.8 4.0 4.5  CL 104 102 96* 97* 96* 98 95*  CO2 24 26 28 25 28 27 31   GLUCOSE 99 101* 117* 106* 89 145* 76  BUN 37* 26* 15 22 12 12  6*  CREATININE 5.41* 3.83* 3.32* 4.63* 3.47* 3.01* 2.76*  CALCIUM 8.8* 8.4* 8.5* 8.7* 8.7* 8.8* 8.6*  MG 2.0  --   --   --   --   --   --   PHOS 6.0* 4.2  --   --  3.6  --   --    GFR: Estimated Creatinine Clearance: 12.2 mL/min (A) (by C-G formula based on SCr of 2.76 mg/dL (H)). Liver Function Tests: Recent Labs  Lab 10/16/17 0856 10/19/17 0414  ALBUMIN 2.5* 2.4*   No results for input(s): LIPASE, AMYLASE in the last 168 hours. No results for input(s): AMMONIA in the last 168 hours. Coagulation Profile: No results for input(s): INR, PROTIME in the last 168 hours. Cardiac Enzymes: Recent Labs  Lab 10/16/17 0022 10/16/17 0709  TROPONINI 1.65* 1.33*   BNP (last 3 results) No results for input(s): PROBNP in the last 8760 hours. HbA1C: No results for input(s): HGBA1C in the last 72 hours. CBG: Recent Labs  Lab 10/21/17 2319 10/22/17 0342 10/22/17 0735 10/22/17 0923 10/22/17 1143  GLUCAP 84 88 74 110* 83   Lipid Profile: No results for input(s): CHOL, HDL, LDLCALC, TRIG, CHOLHDL, LDLDIRECT in the last 72 hours. Thyroid Function Tests: No results for input(s): TSH, T4TOTAL, FREET4, T3FREE, THYROIDAB in the last 72 hours. Anemia Panel: No results for input(s): VITAMINB12, FOLATE, FERRITIN, TIBC, IRON, RETICCTPCT in the last 72 hours.    Radiology Studies: I have reviewed all of the imaging during this hospital visit personally     Scheduled Meds: . arformoterol  15 mcg Nebulization BID  . aspirin EC  81 mg Oral Daily  . atorvastatin  20 mg Oral q1800  . budesonide (PULMICORT) nebulizer solution  0.5 mg Nebulization BID   . chlorhexidine  15 mL Mouth Rinse BID  . Chlorhexidine Gluconate Cloth  6 each Topical Q0600  . darbepoetin (ARANESP) injection - DIALYSIS  200 mcg Intravenous Q Thu-HD  . diphenhydrAMINE  25 mg Oral Once  . fentaNYL  25 mcg Transdermal Q72H  . gabapentin  300 mg Oral QHS  . heparin  5,000 Units Subcutaneous Q8H  . insulin aspart  0-9 Units Subcutaneous Q4H  . mouth rinse  15 mL Mouth Rinse q12n4p  . sodium chloride flush  3 mL Intravenous Q12H   Continuous Infusions: . sodium chloride 10 mL/hr at 10/17/17 0600  . sodium chloride Stopped (10/17/17 2100)  . sodium chloride    . sodium chloride       LOS: 7 days    Time spent    Zannie Cove, MD [facility] Page via [email or web address] password [facility]

## 2017-10-23 LAB — BASIC METABOLIC PANEL
Anion gap: 12 (ref 5–15)
BUN: 13 mg/dL (ref 8–23)
CALCIUM: 8.7 mg/dL — AB (ref 8.9–10.3)
CO2: 28 mmol/L (ref 22–32)
CREATININE: 4.21 mg/dL — AB (ref 0.44–1.00)
Chloride: 94 mmol/L — ABNORMAL LOW (ref 98–111)
GFR calc Af Amer: 10 mL/min — ABNORMAL LOW (ref >60)
GFR, EST NON AFRICAN AMERICAN: 9 mL/min — AB (ref >60)
GLUCOSE: 66 mg/dL — AB (ref 70–99)
POTASSIUM: 5 mmol/L (ref 3.5–5.1)
Sodium: 134 mmol/L — ABNORMAL LOW (ref 135–145)

## 2017-10-23 LAB — GLUCOSE, CAPILLARY
GLUCOSE-CAPILLARY: 82 mg/dL (ref 70–99)
GLUCOSE-CAPILLARY: 135 mg/dL — AB (ref 70–99)
Glucose-Capillary: 134 mg/dL — ABNORMAL HIGH (ref 70–99)
Glucose-Capillary: 63 mg/dL — ABNORMAL LOW (ref 70–99)
Glucose-Capillary: 65 mg/dL — ABNORMAL LOW (ref 70–99)
Glucose-Capillary: 73 mg/dL (ref 70–99)
Glucose-Capillary: 76 mg/dL (ref 70–99)
Glucose-Capillary: 81 mg/dL (ref 70–99)
Glucose-Capillary: 86 mg/dL (ref 70–99)
Glucose-Capillary: 87 mg/dL (ref 70–99)

## 2017-10-23 LAB — CBC
HCT: 30.2 % — ABNORMAL LOW (ref 36.0–46.0)
HEMATOCRIT: 34.7 % — AB (ref 36.0–46.0)
Hemoglobin: 8.9 g/dL — ABNORMAL LOW (ref 12.0–15.0)
Hemoglobin: 10.2 g/dL — ABNORMAL LOW (ref 12.0–15.0)
MCH: 25.9 pg — AB (ref 26.0–34.0)
MCH: 26 pg (ref 26.0–34.0)
MCHC: 29.5 g/dL — AB (ref 30.0–36.0)
MCHC: 29.4 g/dL — ABNORMAL LOW (ref 30.0–36.0)
MCV: 88 fL (ref 78.0–100.0)
MCV: 88.3 fL (ref 78.0–100.0)
PLATELETS: 306 10*3/uL (ref 150–400)
Platelets: 315 10*3/uL (ref 150–400)
RBC: 3.43 MIL/uL — ABNORMAL LOW (ref 3.87–5.11)
RBC: 3.93 MIL/uL (ref 3.87–5.11)
RDW: 20 % — AB (ref 11.5–15.5)
RDW: 20 % — AB (ref 11.5–15.5)
WBC: 11.5 10*3/uL — ABNORMAL HIGH (ref 4.0–10.5)
WBC: 12.2 10*3/uL — AB (ref 4.0–10.5)

## 2017-10-23 LAB — RENAL FUNCTION PANEL
Albumin: 2.4 g/dL — ABNORMAL LOW (ref 3.5–5.0)
Anion gap: 13 (ref 5–15)
BUN: 15 mg/dL (ref 8–23)
CHLORIDE: 95 mmol/L — AB (ref 98–111)
CO2: 28 mmol/L (ref 22–32)
Calcium: 9.2 mg/dL (ref 8.9–10.3)
Creatinine, Ser: 4.69 mg/dL — ABNORMAL HIGH (ref 0.44–1.00)
GFR calc Af Amer: 9 mL/min — ABNORMAL LOW (ref >60)
GFR, EST NON AFRICAN AMERICAN: 8 mL/min — AB (ref >60)
Glucose, Bld: 85 mg/dL (ref 70–99)
POTASSIUM: 5.5 mmol/L — AB (ref 3.5–5.1)
Phosphorus: 5.8 mg/dL — ABNORMAL HIGH (ref 2.5–4.6)
Sodium: 136 mmol/L (ref 135–145)

## 2017-10-23 MED ORDER — SODIUM CHLORIDE 0.9 % IV SOLN: 100.0000 mL | INTRAVENOUS | Status: DC | PRN

## 2017-10-23 MED ORDER — HEPARIN SODIUM (PORCINE) 1000 UNIT/ML DIALYSIS
1000.0000 [IU] | INTRAMUSCULAR | Status: DC | PRN
  Filled 2017-10-23: qty 1

## 2017-10-23 MED ORDER — PENTAFLUOROPROP-TETRAFLUOROETH EX AERO: 1.0000 "application " | INHALATION_SPRAY | CUTANEOUS | Status: DC | PRN

## 2017-10-23 MED ORDER — LIDOCAINE HCL (PF) 1 % IJ SOLN: 5.0000 mL | INTRAMUSCULAR | Status: DC | PRN

## 2017-10-23 MED ORDER — LIDOCAINE-PRILOCAINE 2.5-2.5 % EX CREA
1.0000 "application " | TOPICAL_CREAM | CUTANEOUS | Status: DC | PRN
  Filled 2017-10-23: qty 5

## 2017-10-23 MED ORDER — DEXTROSE 50 % IV SOLN
INTRAVENOUS | Status: AC
Start: 2017-10-23 — End: 2017-10-23
  Administered 2017-10-23: 50 mL
  Filled 2017-10-23: qty 50

## 2017-10-23 MED ORDER — ALTEPLASE 2 MG IJ SOLR: 2.0000 mg | Freq: Once | INTRAMUSCULAR | Status: DC | PRN

## 2017-10-23 MED ORDER — HEPARIN SODIUM (PORCINE) 1000 UNIT/ML DIALYSIS
2000.0000 [IU] | INTRAMUSCULAR | Status: DC | PRN
  Administered 2017-10-24: 2000 [IU] via INTRAVENOUS_CENTRAL
  Filled 2017-10-23 (×2): qty 2

## 2017-10-23 MED ORDER — DEXTROSE 50 % IV SOLN
INTRAVENOUS | Status: AC
  Administered 2017-10-23: 25 mL
  Filled 2017-10-23: qty 50

## 2017-10-23 MED ORDER — CHLORHEXIDINE GLUCONATE CLOTH 2 % EX PADS
6.0000 | MEDICATED_PAD | Freq: Every day | CUTANEOUS | Status: DC
  Administered 2017-10-23 – 2017-10-27 (×2): 6 via TOPICAL

## 2017-10-23 NOTE — Progress Notes (Signed)
PROGRESS NOTE    Nicole Cox  ZOX:[id number] DOB: 1931/04/22 DOA: 10/15/2017 PCP: Joana Reamer, MD    Brief Narrative:  84 yo female who presented with altered mental status. She does have the significant medical history of ESRD on HD, HTN, CVA, diastolic heart failure, CAD and COPD. Recent hospitalization forAcinetobacter pneumonia. Discharged (07/20) on Imipenem that she did not complete due to declined IV access at the SNF. Head CT without atrophy, no acute changes. Chest x-ray with right lower lobe alveolar/interstitial infiltrate.Patient was admitted to hospital with working diagnosis of right lower lobe pneumonia -Noted to have peripheral vascular disease with gangrenous changes in both feet -palliative medicine medicine following, vascular surgery consulted, underwent aortogram 8/9  Assessment & Plan:   1. Aspiration pneumonia (present on admission).  -had been on IV imipenem from prior to admission, this was changed to meropenem on admission,  -completed 7day course, Abx Stopped - remains at risk for aspiration due to sedation from narcotics-for ischemic pain -SLP evaluation completed, noted to have dysphagia currently on dysphagia 1 diet with nectar thick liquids -stable and improved now  2. Acute metabolic encephalopathy -due to above infection and narcotics -stable now, fentanyl dose and gabapentin decreased  3. New onset atrial fibrillation. -now in sinus rhythm, heart rate better controlled now -overall prognosis is very poor, added aspirin  4. ESRD on HDwith anemia of chronic renal disease -overall prognosis very poor given ongoing multisystem decline -renal and palliative medicine following  5. COPD with pulmonary hypertension  -stable, contine arfomoterol and budesonide.   6. T2DM.  -stable, continue sliding scale insulin  7. PAD with gangrenous changes in both toes -Positive tibial artery stenosis on arterial dopplers,   -she is a very poor  surgical candidate, VVS following, continue aspirin and statin -underwent aortogram 8/9-diffuse calcific atherosclerosis, occluded posterior tibials bilaterally, failed attempt at right tibial cannulation -Await input from Dr.Cain -continue narcotics -Palliative following  Ethics: this is a very frail chronically ill patient with ESRD, COPD, A. Fib, dysphagia, aspiration pneumonia, severe peripheral vascular disease with bilateral gangrene -palliative medicine following, now DO NOT RESUSCITATE  DVT prophylaxis: hep SQ Code Status:dnr Family Communication:no family at bedside today, discussed with daughter [name] 8/7, 8/8 Disposition Plan/ discharge barriers:to be determined based on vascular evaluations and palliative discussions   Consultants:  vascular surgery  Palliative medicine  Procedures:    Antimicrobials: Meropenem 8/2-8/9   Subjective: -continues to complain of pain, more lucidtoday -Complains of pain in the left foot as well today  Objective: Vitals:   10/22/17 2054 10/23/17 0433 10/23/17 0800 10/23/17 0854  BP:  (!) 152/49    Pulse:  77 71   Resp:  14    Temp:  98 F (36.7 C)    TempSrc:  Axillary    SpO2: 98% 98% 100% 100%  Weight:  55.7 kg    Height:       No intake or output data in the 24 hours ending 10/23/17 1158 Filed Weights   10/21/17 2323 10/22/17 1515 10/23/17 0433  Weight: 55.3 kg 54.6 kg 55.7 kg    Examination:   Gen: chronically ill debilitated female laying in bed, more awake today HEENT: PERRLA, Neck supple, no JVD Lungs: decreased breath sounds at bases CVS: RRR,No Gallops,Rubs or new Murmurs Abd: soft, Non tender, non distended, BS present Extremities:  Gangrenous changes in both feet, right greater than left great toe Skin: as above  Data Reviewed: I have personally reviewed following labs and imaging studies  CBC: Recent Labs  Lab 10/18/17 0403 10/19/17 0414 10/20/17 0538 10/22/17 0449  10/23/17 0325 10/23/17 1037  WBC 13.4* 16.2* 15.5* 13.0* 11.5* 12.2*  NEUTROABS 10.1* 12.2* 11.6*  --   --   --   HGB 8.8* 9.1* 9.2* 9.5* 8.9* 10.2*  HCT 29.7* 30.8* 31.1* 32.4* 30.2* 34.7*  MCV 88.1 88.0 88.4 89.3 88.0 88.3  PLT 256 261 332 328 306 315   Basic Metabolic Panel: Recent Labs  Lab 10/19/17 0414 10/20/17 0538 10/22/17 0449 10/23/17 0325 10/23/17 1037  NA 137 137 138 134* 136  K 3.8 4.0 4.5 5.0 5.5*  CL 96* 98 95* 94* 95*  CO2 28 27 31 28 28   GLUCOSE 89 145* 76 66* 85  BUN 12 12 6* 13 15  CREATININE 3.47* 3.01* 2.76* 4.21* 4.69*  CALCIUM 8.7* 8.8* 8.6* 8.7* 9.2  PHOS 3.6  --   --   --  5.8*   GFR: Estimated Creatinine Clearance: 7.2 mL/min (A) (by C-G formula based on SCr of 4.69 mg/dL (H)). Liver Function Tests: Recent Labs  Lab 10/19/17 0414 10/23/17 1037  ALBUMIN 2.4* 2.4*   No results for input(s): LIPASE, AMYLASE in the last 168 hours. No results for input(s): AMMONIA in the last 168 hours. Coagulation Profile: No results for input(s): INR, PROTIME in the last 168 hours. Cardiac Enzymes: No results for input(s): CKTOTAL, CKMB, CKMBINDEX, TROPONINI in the last 168 hours. BNP (last 3 results) No results for input(s): PROBNP in the last 8760 hours. HbA1C: No results for input(s): HGBA1C in the last 72 hours. CBG: Recent Labs  Lab 10/23/17 0431 10/23/17 0501 10/23/17 0731 10/23/17 0933 10/23/17 1149  GLUCAP 65* 134* 87 76 86   Lipid Profile: No results for input(s): CHOL, HDL, LDLCALC, TRIG, CHOLHDL, LDLDIRECT in the last 72 hours. Thyroid Function Tests: No results for input(s): TSH, T4TOTAL, FREET4, T3FREE, THYROIDAB in the last 72 hours. Anemia Panel: No results for input(s): VITAMINB12, FOLATE, FERRITIN, TIBC, IRON, RETICCTPCT in the last 72 hours.    Radiology Studies: I have reviewed all of the imaging during this hospital visit personally     Scheduled Meds: . arformoterol  15 mcg Nebulization BID  . aspirin EC  81 mg Oral  Daily  . atorvastatin  20 mg Oral q1800  . budesonide (PULMICORT) nebulizer solution  0.5 mg Nebulization BID  . chlorhexidine  15 mL Mouth Rinse BID  . Chlorhexidine Gluconate Cloth  6 each Topical Q0600  . Chlorhexidine Gluconate Cloth  6 each Topical Q0600  . darbepoetin (ARANESP) injection - DIALYSIS  200 mcg Intravenous Q Thu-HD  . diphenhydrAMINE  25 mg Oral Once  . fentaNYL  25 mcg Transdermal Q72H  . gabapentin  300 mg Oral QHS  . heparin  5,000 Units Subcutaneous Q8H  . insulin aspart  0-9 Units Subcutaneous Q4H  . mouth rinse  15 mL Mouth Rinse q12n4p  . sodium chloride flush  3 mL Intravenous Q12H  . sodium chloride flush  3 mL Intravenous Q12H   Continuous Infusions: . sodium chloride 10 mL/hr at 10/17/17 0600  . sodium chloride Stopped (10/17/17 2100)  . sodium chloride    . sodium chloride    . sodium chloride    . sodium chloride    . sodium chloride       LOS: 8 days    Time spent 25min    Zannie CovePreetha Dusty Wagoner, MD [facility] Page via [email or web address] password [email or web address]

## 2017-10-23 NOTE — Progress Notes (Addendum)
   10/23/17 1800  Clinical Encounter Type  Visited With Patient  Visit Type Initial  Referral From Nurse  Consult/Referral To Chaplain  Spiritual Encounters  Spiritual Needs Prayer;Emotional  Stress Factors  Patient Stress Factors Exhausted    Pt was deeply asleep when Chaplain visited. Chaplain tried calling pt up  In vain. The NT present said Pt had been sleeping most of the day. Chaplain will refer to next chaplain .  Emelia Sandoval a Water quality scientistMusiko-Holley, E. I. du PontChaplain

## 2017-10-23 NOTE — Progress Notes (Addendum)
[facility] Progress Note   Subjective:  Not answering questions today but following commands.    Objective Vitals:   10/22/17 2047 10/22/17 2054 10/23/17 0433 10/23/17 0854  BP: (!) 162/52  (!) 152/49   Pulse: 78  77   Resp: 16  14   Temp: 97.6 F (36.4 C)  98 F (36.7 C)   TempSrc: Axillary  Axillary   SpO2: 98% 98% 98% 100%  Weight:   55.7 kg   Height:       Physical Exam General:NAD, chronically ill appearing, frail, elderly female Heart:RRR, no mrg  Lungs:CTAB anteriorly Abdomen:soft, +BS, NTND Extremities:no LE edema Dialysis Access: LU AVG +b/t   Filed Weights   10/21/17 2323 10/22/17 1515 10/23/17 0433  Weight: 55.3 kg 54.6 kg 55.7 kg   No intake or output data in the 24 hours ending 10/23/17 1109  Additional Objective Labs: Basic Metabolic Panel: Recent Labs  Lab 10/19/17 0414 10/20/17 0538 10/22/17 0449 10/23/17 0325  NA 137 137 138 134*  K 3.8 4.0 4.5 5.0  CL 96* 98 95* 94*  CO2 28 27 31 28   GLUCOSE 89 145* 76 66*  BUN 12 12 6* 13  CREATININE 3.47* 3.01* 2.76* 4.21*  CALCIUM 8.7* 8.8* 8.6* 8.7*  PHOS 3.6  --   --   --    Liver Function Tests: Recent Labs  Lab 10/19/17 0414  ALBUMIN 2.4*   CBC: Recent Labs  Lab 10/18/17 0403 10/19/17 0414 10/20/17 0538 10/22/17 0449 10/23/17 0325 10/23/17 1037  WBC 13.4* 16.2* 15.5* 13.0* 11.5* 12.2*  NEUTROABS 10.1* 12.2* 11.6*  --   --   --   HGB 8.8* 9.1* 9.2* 9.5* 8.9* 10.2*  HCT 29.7* 30.8* 31.1* 32.4* 30.2* 34.7*  MCV 88.1 88.0 88.4 89.3 88.0 88.3  PLT 256 261 332 328 306 315   Blood Culture    Component Value Date/Time   SDES BLOOD RIGHT HAND 10/15/2017 1515   SPECREQUEST  10/15/2017 1515    BOTTLES DRAWN AEROBIC ONLY Blood Culture results may not be optimal due to an inadequate volume of blood received in culture bottles   CULT  10/15/2017 1515    NO GROWTH 5 DAYS [facility]formed at [facility], [address] [place], [place] [address]    REPTSTATUS 10/20/2017  FINAL 10/15/2017 1515   CBG: Recent Labs  Lab 10/23/17 0025 10/23/17 0431 10/23/17 0501 10/23/17 0731 10/23/17 0933  GLUCAP 73 65* 134* 87 76    Lab Results  Component Value Date   INR 1.74 10/15/2017   INR 1.09 07/27/2017   Studies/Results: No results found.  Medications: . sodium chloride 10 mL/hr at 10/17/17 0600  . sodium chloride Stopped (10/17/17 2100)  . sodium chloride    . sodium chloride    . sodium chloride    . sodium chloride    . sodium chloride     . arformoterol  15 mcg Nebulization BID  . aspirin EC  81 mg Oral Daily  . atorvastatin  20 mg Oral q1800  . budesonide (PULMICORT) nebulizer solution  0.5 mg Nebulization BID  . chlorhexidine  15 mL Mouth Rinse BID  . Chlorhexidine Gluconate Cloth  6 each Topical Q0600  . Chlorhexidine Gluconate Cloth  6 each Topical Q0600  . darbepoetin (ARANESP) injection - DIALYSIS  200 mcg Intravenous Q Thu-HD  . diphenhydrAMINE  25 mg Oral Once  . fentaNYL  25 mcg Transdermal Q72H  . gabapentin  300 mg Oral QHS  .  heparin  5,000 Units Subcutaneous Q8H  . insulin aspart  0-9 Units Subcutaneous Q4H  . mouth rinse  15 mL Mouth Rinse q12n4p  . sodium chloride flush  3 mL Intravenous Q12H  . sodium chloride flush  3 mL Intravenous Q12H    Dialysis Orders: [facility]/ Dr Louis Meckel TTS 3h 121lbs 2/2.5 Hep 2000 then 250 / hr AVG  - [id number] q 2 weeks- last given 7/25;last hgb at center 8.7 -hect 1 mcg IV q tx  Summary: 84yo chronically ill female w/recent sepsis - now returning 2 weeks later w/dec MS, elevated WBC and lactate concerning for sepsis.   Assessment/Plan: 1. AMS/Asp PNA/sepsis - per CCM, on Meropenem.  BC NGx5d. WBC 12.2. Afebrile overnight.  2. ESRD - TTS HD. K 5.0. HD orders written for today per regular schedule. UF goal 1-2L. 3. Anemia of CKD- Hgb 10.2 s/p 1 unit pRBC on 8/3.  Aranesp qwk (Tues)   Holding Fe d/t infection.  4. Metabolic Bone Disease  - Ca 8.7. Last Phos in goal.  Continue VDRA. 5. HTN/volume - BP elevated. Expect improvement post HD. Under EDW, will need lower edw at d/c.   6. Nutrition - Alb 2.4. Renal diet w/fluid restrictions. Renavite. Nepro.   7. Decreased mental status - Sepsis vs Neurontin.  Still with come confusion. 8. A fib - currently NSR.  9. R Great Toe Necrosis - dry gangrene R toes. s/p aortogram 8/9 by Dr Imogene Burn, diffuse calcification noted, unsuccessful attempt at R tibial artery recannulation 10. GOC - palliative care consult. Now DNR. Wishes to continue HD. Frail w/poor prognosis overall.   Virgina Norfolk, PA-C [facility] Pager: [phone] 10/23/2017,11:09 AM  LOS: 8 days   Pt seen, examined and agree w A/P as above.  Nicole Moselle MD [employer] pager [phone]   10/23/2017, 12:42 PM

## 2017-10-23 NOTE — Progress Notes (Signed)
Pt's blood sugar was 64. Administered 25 ml Dextrose and rechecked 15 minutes later. Blood sugar is now 134, will continue to monitor.

## 2017-10-23 NOTE — Plan of Care (Signed)
Afebrile. WBC 12.2. Plan HD today.

## 2017-10-24 LAB — GLUCOSE, CAPILLARY
GLUCOSE-CAPILLARY: 126 mg/dL — AB (ref 70–99)
GLUCOSE-CAPILLARY: 71 mg/dL (ref 70–99)
GLUCOSE-CAPILLARY: 81 mg/dL (ref 70–99)
GLUCOSE-CAPILLARY: 93 mg/dL (ref 70–99)
Glucose-Capillary: 131 mg/dL — ABNORMAL HIGH (ref 70–99)
Glucose-Capillary: 72 mg/dL (ref 70–99)
Glucose-Capillary: 91 mg/dL (ref 70–99)

## 2017-10-24 LAB — CBC
HEMATOCRIT: 29.3 % — AB (ref 36.0–46.0)
HEMOGLOBIN: 8.6 g/dL — AB (ref 12.0–15.0)
MCH: 25.7 pg — ABNORMAL LOW (ref 26.0–34.0)
MCHC: 29.4 g/dL — ABNORMAL LOW (ref 30.0–36.0)
MCV: 87.5 fL (ref 78.0–100.0)
Platelets: 321 10*3/uL (ref 150–400)
RBC: 3.35 MIL/uL — AB (ref 3.87–5.11)
RDW: 19.9 % — ABNORMAL HIGH (ref 11.5–15.5)
WBC: 11.6 10*3/uL — AB (ref 4.0–10.5)

## 2017-10-24 LAB — RENAL FUNCTION PANEL
ANION GAP: 14 (ref 5–15)
Albumin: 2.2 g/dL — ABNORMAL LOW (ref 3.5–5.0)
BUN: 19 mg/dL (ref 8–23)
CO2: 26 mmol/L (ref 22–32)
Calcium: 8.8 mg/dL — ABNORMAL LOW (ref 8.9–10.3)
Chloride: 91 mmol/L — ABNORMAL LOW (ref 98–111)
Creatinine, Ser: 5.77 mg/dL — ABNORMAL HIGH (ref 0.44–1.00)
GFR, EST AFRICAN AMERICAN: 7 mL/min — AB (ref >60)
GFR, EST NON AFRICAN AMERICAN: 6 mL/min — AB (ref >60)
Glucose, Bld: 91 mg/dL (ref 70–99)
PHOSPHORUS: 6.5 mg/dL — AB (ref 2.5–4.6)
POTASSIUM: 5 mmol/L (ref 3.5–5.1)
Sodium: 131 mmol/L — ABNORMAL LOW (ref 135–145)

## 2017-10-24 MED ORDER — GERHARDT'S BUTT CREAM
TOPICAL_CREAM | Freq: Two times a day (BID) | CUTANEOUS | Status: DC
  Administered 2017-10-24 – 2017-10-27 (×6): via TOPICAL
  Filled 2017-10-24: qty 1

## 2017-10-24 MED ORDER — STARCH (THICKENING) PO POWD: ORAL | Status: DC | PRN

## 2017-10-24 MED ORDER — HYDROMORPHONE HCL 1 MG/ML IJ SOLN: 0.5000 mg | Freq: Once | INTRAMUSCULAR | Status: DC

## 2017-10-24 NOTE — Progress Notes (Addendum)
  Speech Language Pathology Treatment: Dysphagia  Patient Details Name: Nicole Cox MRN: [id number] DOB: [birth year] Today's Date: 10/24/2017 Time: [id number] SLP Time Calculation (min) (ACUTE ONLY): 44 min  Assessment / Plan / Recommendation Clinical Impression  Pt today alert and able to manage utencils for self feeding.  She was weak and ataxic and dropped her cranberry juice *nectar*.  SLP provided more thickened cranberry juice.  Pt is impulsive and takes excessively large bites demonstrating cough x1 of approximately 30 boluses.  Small bites tolerated well  When inquired as to why pt takes large bites, she states she has "always done that".  Reviewed clinical reasoning for small boluses and aspiration risk of foods being precarious.    Observed pt with thin water mixed with cranberry juice - and though no indication of airway compromise - given pt's waxing/waning mental state and acute illness as well as pt not minding thickened liquids per her report - recommend continue nectar liquids.  SLP to follow for dietary advancement readiness.  Informed RN of results of testing/follow up and posted precaution signs in pt's room.  Thanks.  Marland Kitchen.   HPI HPI: 84 year old chronically ill female with recent sepsis- now returning 2 weeks later with dec MS, elevated WBC and lactate concerning for sepsis. Hx end-stage renal disease, hypertension, prior stroke, heart failure with preserved ejection fraction, coronary artery disease as well as COPD. CXR 10/16/17 showed "unchanged diffuse interstitial densities which may reflect mild      SLP Plan  Continue with current plan of care       Recommendations  Diet recommendations: Dysphagia 3 (mechanical soft);Nectar-thick liquid Liquids provided via: Straw Medication Administration: Whole meds with puree Supervision: Full supervision/cueing for compensatory strategies;Staff to assist with self feeding Compensations: Minimize environmental distractions;Slow  rate;Small sips/bites Postural Changes and/or Swallow Maneuvers: Seated upright 90 degrees;Upright 30-60 min after meal                Oral Care Recommendations: Oral care BID Follow up Recommendations: Skilled Nursing facility SLP Visit Diagnosis: Dysphagia, oropharyngeal phase (R13.12) Plan: Continue with current plan of care       GO                Chales AbrahamsKimball, Karlye Ihrig Ann 10/24/2017, 12:59 PM Donavan Burnetamara Daeton Kluth, MS [facility] SLP [phone]

## 2017-10-24 NOTE — Progress Notes (Signed)
HD tx completed w/ bp issues throughout tx, pt received prn dilaudid prior to coming to HD unit,  UF goal not met, blood rinsed back, VSS, report called to Horton Finer, RN

## 2017-10-24 NOTE — Progress Notes (Signed)
HD tx initiated via  15Gx2 w/o problem, pull/push/flush well w/o problem, VSS, but pt got prn Dilaudid prior to coming to HD unit so I anticipate that bp will drop, will cont to monitor while on HD tx

## 2017-10-24 NOTE — Consult Note (Signed)
WOC Nurse wound consult note Reason for Consult:moisture associated skin damage to sacrococcygeal area.   Wound type:MASD Pressure Injury POA: NA Measurement: 2 cmx 2 cmx 0.1 cm Wound ZOX:WRUEbed:pink and moist Drainage (amount, consistency, odor)scant weeping  Periwound:intact Dressing procedure/placement/frequency:Gerhardts butt paste twice daily.  OPen to air.  Will not follow at this time.  Please re-consult if needed.  Maple HudsonKaren Mckinzi Eriksen RN BSN [facility] Pager [phone]

## 2017-10-24 NOTE — Plan of Care (Signed)
Cardiac Monitor. Able to feed self with stand by assist. Gerhardt's butt cream ordered. Sacral Foam pad. Fentanyl patch (q 72 hr) changed to Rt upper arm. Dilaudid prn breakthrough pain.

## 2017-10-24 NOTE — Progress Notes (Signed)
Patient ID: Nicole Cox, female   DOB: 31-Jan-1932, 84 y.o.   MRN: [id number] Discussed with Dr.Cain No endovascular options for revascularization and obviously not open surgical candidate.  Would continue to treat foot ischemia nonoperatively.  Agree with assessment from palliative care medicine for supportive care.  Only option would be amputation if she has progressive gangrenous changes.  Will not follow actively.  Please consult if we can provide assistance

## 2017-10-24 NOTE — Progress Notes (Addendum)
[facility] Progress Note   Subjective:  No new complaints.   Objective Vitals:   10/24/17 0503 10/24/17 0600 10/24/17 0731 10/24/17 0816  BP:  (!) 140/54    Pulse:      Resp:      Temp:      TempSrc:      SpO2:   99% 99%  Weight: 54.9 kg     Height:       Physical Exam General:NAD, chronically ill appearing female Heart:RRR, no mrg Lungs:CTAB, nml WOB Abdomen:soft, NTND Extremities:no LE edema Dialysis Access: LU AVG +b/t   Filed Weights   10/24/17 0037 10/24/17 0419 10/24/17 0503  Weight: 55.6 kg 55 kg 54.9 kg    Intake/Output Summary (Last 24 hours) at 10/24/2017 1149 Last data filed at 10/24/2017 1014 Gross per 24 hour  Intake 640 ml  Output 879 ml  Net -239 ml    Additional Objective Labs: Basic Metabolic Panel: Recent Labs  Lab 10/19/17 0414  10/23/17 0325 10/23/17 1037 10/24/17 0045  NA 137   < > 134* 136 131*  K 3.8   < > 5.0 5.5* 5.0  CL 96*   < > 94* 95* 91*  CO2 28   < > 28 28 26   GLUCOSE 89   < > 66* 85 91  BUN 12   < > 13 15 19   CREATININE 3.47*   < > 4.21* 4.69* 5.77*  CALCIUM 8.7*   < > 8.7* 9.2 8.8*  PHOS 3.6  --   --  5.8* 6.5*   < > = values in this interval not displayed.   Liver Function Tests: Recent Labs  Lab 10/19/17 0414 10/23/17 1037 10/24/17 0045  ALBUMIN 2.4* 2.4* 2.2*   CBC: Recent Labs  Lab 10/18/17 0403 10/19/17 0414 10/20/17 0538 10/22/17 0449 10/23/17 0325 10/23/17 1037 10/24/17 0045  WBC 13.4* 16.2* 15.5* 13.0* 11.5* 12.2* 11.6*  NEUTROABS 10.1* 12.2* 11.6*  --   --   --   --   HGB 8.8* 9.1* 9.2* 9.5* 8.9* 10.2* 8.6*  HCT 29.7* 30.8* 31.1* 32.4* 30.2* 34.7* 29.3*  MCV 88.1 88.0 88.4 89.3 88.0 88.3 87.5  PLT 256 261 332 328 306 315 321   Blood Culture    Component Value Date/Time   SDES BLOOD RIGHT HAND 10/15/2017 1515   SPECREQUEST  10/15/2017 1515    BOTTLES DRAWN AEROBIC ONLY Blood Culture results may not be optimal due to an inadequate volume of blood received in culture bottles    CULT  10/15/2017 1515    NO GROWTH 5 DAYS Performed at [facility], [address], [place], [place] [address]    REPTSTATUS 10/20/2017 FINAL 10/15/2017 1515    Cardiac Enzymes: No results for input(s): CKTOTAL, CKMB, CKMBINDEX, TROPONINI in the last 168 hours. CBG: Recent Labs  Lab 10/23/17 2116 10/23/17 2348 10/24/17 0013 10/24/17 0500 10/24/17 0749  GLUCAP 135* 72 81 71 91   Studies/Results: No results found.  Medications: . sodium chloride 10 mL/hr at 10/17/17 0600  . sodium chloride Stopped (10/17/17 2100)  . sodium chloride    . sodium chloride    . sodium chloride    . sodium chloride    . sodium chloride     . arformoterol  15 mcg Nebulization BID  . aspirin EC  81 mg Oral Daily  . atorvastatin  20 mg Oral q1800  . budesonide (PULMICORT) nebulizer solution  0.5 mg Nebulization BID  . chlorhexidine  15  mL Mouth Rinse BID  . Chlorhexidine Gluconate Cloth  6 each Topical Q0600  . Chlorhexidine Gluconate Cloth  6 each Topical Q0600  . darbepoetin (ARANESP) injection - DIALYSIS  200 mcg Intravenous Q Thu-HD  . diphenhydrAMINE  25 mg Oral Once  . fentaNYL  25 mcg Transdermal Q72H  . gabapentin  300 mg Oral QHS  . Gerhardt's butt cream   Topical BID  . heparin  5,000 Units Subcutaneous Q8H  .  HYDROmorphone (DILAUDID) injection  0.5 mg Intravenous Once  . insulin aspart  0-9 Units Subcutaneous Q4H  . mouth rinse  15 mL Mouth Rinse q12n4p  . sodium chloride flush  3 mL Intravenous Q12H  . sodium chloride flush  3 mL Intravenous Q12H    Dialysis Orders: [facility]/ Dr Louis MeckelStovall TTS 3h 121lbs 2/2.5 Hep 2000 then 250 / hr AVG  - [id number] q 2 weeks- last given 7/25;last hgb at center 8.7 -hect 1 mcg IV q tx  Summary: 85yo chronically ill female w/recent sepsis - now returning 2 weeks later w/dec MS, elevated WBC and lactate concerning for sepsis.  Assessment/Plan: 1. 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 s/p aortogram 8/9 by Dr  Imogene Burnhen, diffuse calcification noted, unsuccessful attempt at R tibial artery recannulation.  Per Dr. Arbie CookeyEarly no endovascular options and not surgical candidate.  Recommended to treat nonoperatively & if cont to progress - amputation.  2. Asp PNA/sepsis - Completed course Meropenem. BC NGx5d. WBC 11.6. Afebrile. Per primary. 3. ESRD -TTS HD. Last K 5.0. HD completed yesterday with net UF removed 879mL.  Continue HD per regular schedule.   4. Anemia of CKD-Hgb 8.6. s/p 1 unit pRBC on 8/3. Aranesp 100mcg qwk (Tues) Holding Fe d/t infection.  5. Metabolic Bone Disease- Ca 8.8. Phos ^6.5.May need to start binders if remains elevated. Continue VDRA. 6. HTN/volume -BP variable. At edw post HD.  Does not appear grossly volume overloaded on exam.  7. Nutrition -Alb 2.2. Renal diet w/fluid restrictions. Renavite. Nepro.  8. Decreased mental status -Sepsis vs Neurontin. Waxing and waning.  Appears improved today. 9. A fib -currently NSR. 10. GOC- palliative care consult. Now DNR. Wishes to continue HD. Frail w/poor prognosis overall.  Virgina NorfolkLindsay Penninger, PA-C [facility] Pager: [phone] 10/24/2017,11:49 AM  LOS: 9 days   Pt seen, examined and agree w A/P as above.  Nicole Cox Jacquelene Kopecky MD [facility] pager [phone]   10/24/2017, 2:00 PM

## 2017-10-24 NOTE — Progress Notes (Signed)
PROGRESS NOTE    Nicole DecantLizzie Cox  NWG:[id number] DOB: 1931/06/26 DOA: 10/15/2017 PCP: [name], J Stewart, MD    Brief Narrative:  84 yo female who presented with altered mental status. She does have the significant medical history of ESRD on HD, HTN, CVA, diastolic heart failure, CAD and COPD. Recent hospitalization forAcinetobacter pneumonia. Discharged (07/20) on Imipenem that she did not complete due to declined IV access at the SNF. Head CT without atrophy, no acute changes. Chest x-ray with right lower lobe alveolar/interstitial infiltrate.Patient was admitted to hospital with working diagnosis of right lower lobe pneumonia -Noted to have peripheral vascular disease with gangrenous changes in both feet -palliative medicine medicine following, vascular surgery consulted, underwent aortogram 8/9  Assessment & Plan:   Aspiration pneumonia (present on admission).  -had been on IV imipenem from prior to admission, this was changed to meropenem on admission,  -completed 7day course, Abx Stopped - remains at risk for aspiration due to sedation from narcotics-for ischemic pain -SLP evaluation completed, noted to have dysphagia currently on dysphagia 1 diet with nectar thick liquids -stable and improved now  PAD with gangrenous changes in both toes -she is a very poor surgical candidate, VVS following, continue aspirin and statin -underwent aortogram 8/9-diffuse calcific atherosclerosis, occluded posterior tibials bilaterally, failed attempt at right tibial cannulation -appreciate vascular input from Dr. Arbie CookeyEarly today, no endovascular options for revascularization and she is not a candidate for open vascular bypass, only option would be amputation for progressive gangrenous changes -I called and discussed with daughter/PoA Nicole Cox, explained vascular recommendations, plan at this time is to continue narcotics, palliative measures, continue dialysis as tolerated -Discharge back to SNF with  palliative care follow-up and dialysis, she is agreeable to consider hospice down the road at SNF as she deteriorates, explained that ability to tolerate HD will decrease  3. Acute metabolic encephalopathy -due to above infection and narcotics -stable now, fentanyl dose and gabapentin decreased  3. New onset atrial fibrillation. -now in sinus rhythm, heart rate better controlled now -overall prognosis is very poor, added aspirin  4. ESRD on HDwith anemia of chronic renal disease -overall prognosis very poor given ongoing multisystem decline -renal and palliative medicine following  5. COPD with pulmonary hypertension  -stable, contine arfomoterol and budesonide.   6. T2DM.  -stable, continue sliding scale insulin  Ethics: this is a very frail chronically ill patient with ESRD, COPD, A. Fib, dysphagia, aspiration pneumonia, severe peripheral vascular disease with bilateral gangrene -palliative medicine following, now DO NOT RESUSCITATE -discharge back to SNF tomorrow with palliative care -Explained very poor overall prognosis to daughter  DVT prophylaxis: hep SQ Code Status:dnr Family Communication:no family at bedside today, discussed with daughter Nicole Cox 8/7, 8/8 and today 8/11 Disposition Plan: SNF tomorrow with palliative care follow-up  Consultants:  vascular surgery  Palliative medicine  Renal  Procedures:  aortogram 8/9  Antimicrobials: Meropenem 8/2-8/9   Subjective: -continues to complain of pain, less uncomfortable today Objective: Vitals:   10/24/17 0503 10/24/17 0600 10/24/17 0731 10/24/17 0816  BP:  (!) 140/54    Pulse:      Resp:      Temp:      TempSrc:      SpO2:   99% 99%  Weight: 54.9 kg     Height:        Intake/Output Summary (Last 24 hours) at 10/24/2017 1333 Last data filed at 10/24/2017 1258 Gross per 24 hour  Intake 760 ml  Output 879 ml  Net -119  ml   Filed Weights   10/24/17 0037 10/24/17 0419 10/24/17  0503  Weight: 55.6 kg 55 kg 54.9 kg    Examination:   Gen: chronically ill debilitated female laying in bed, awake, answers a few questions HEENT: PERRLA, Neck supple, no JVD Lungs: Good air movement bilaterally, CTAB CVS: RRR,No Gallops,Rubs or new Murmurs Abd: soft, Non tender, non distended, BS present Extremities:  Gangrenous changes in both feet, right greater than left great toe Skin: as above  Data Reviewed: I have personally reviewed following labs and imaging studies  CBC: Recent Labs  Lab 10/18/17 0403 10/19/17 0414 10/20/17 0538 10/22/17 0449 10/23/17 0325 10/23/17 1037 10/24/17 0045  WBC 13.4* 16.2* 15.5* 13.0* 11.5* 12.2* 11.6*  NEUTROABS 10.1* 12.2* 11.6*  --   --   --   --   HGB 8.8* 9.1* 9.2* 9.5* 8.9* 10.2* 8.6*  HCT 29.7* 30.8* 31.1* 32.4* 30.2* 34.7* 29.3*  MCV 88.1 88.0 88.4 89.3 88.0 88.3 87.5  PLT 256 261 332 328 306 315 321   Basic Metabolic Panel: Recent Labs  Lab 10/19/17 0414 10/20/17 0538 10/22/17 0449 10/23/17 0325 10/23/17 1037 10/24/17 0045  NA 137 137 138 134* 136 131*  K 3.8 4.0 4.5 5.0 5.5* 5.0  CL 96* 98 95* 94* 95* 91*  CO2 28 27 31 28 28 26   GLUCOSE 89 145* 76 66* 85 91  BUN 12 12 6* 13 15 19   CREATININE 3.47* 3.01* 2.76* 4.21* 4.69* 5.77*  CALCIUM 8.7* 8.8* 8.6* 8.7* 9.2 8.8*  PHOS 3.6  --   --   --  5.8* 6.5*   GFR: Estimated Creatinine Clearance: 5.8 mL/min (A) (by C-G formula based on SCr of 5.77 mg/dL (H)). Liver Function Tests: Recent Labs  Lab 10/19/17 0414 10/23/17 1037 10/24/17 0045  ALBUMIN 2.4* 2.4* 2.2*   No results for input(s): LIPASE, AMYLASE in the last 168 hours. No results for input(s): AMMONIA in the last 168 hours. Coagulation Profile: No results for input(s): INR, PROTIME in the last 168 hours. Cardiac Enzymes: No results for input(s): CKTOTAL, CKMB, CKMBINDEX, TROPONINI in the last 168 hours. BNP (last 3 results) No results for input(s): PROBNP in the last 8760 hours. HbA1C: No results for  input(s): HGBA1C in the last 72 hours. CBG: Recent Labs  Lab 10/23/17 2348 10/24/17 0013 10/24/17 0500 10/24/17 0749 10/24/17 1201  GLUCAP 72 81 71 91 93   Lipid Profile: No results for input(s): CHOL, HDL, LDLCALC, TRIG, CHOLHDL, LDLDIRECT in the last 72 hours. Thyroid Function Tests: No results for input(s): TSH, T4TOTAL, FREET4, T3FREE, THYROIDAB in the last 72 hours. Anemia Panel: No results for input(s): VITAMINB12, FOLATE, FERRITIN, TIBC, IRON, RETICCTPCT in the last 72 hours.    Radiology Studies: I have reviewed all of the imaging during this hospital visit personally     Scheduled Meds: . arformoterol  15 mcg Nebulization BID  . aspirin EC  81 mg Oral Daily  . atorvastatin  20 mg Oral q1800  . budesonide (PULMICORT) nebulizer solution  0.5 mg Nebulization BID  . chlorhexidine  15 mL Mouth Rinse BID  . Chlorhexidine Gluconate Cloth  6 each Topical Q0600  . Chlorhexidine Gluconate Cloth  6 each Topical Q0600  . darbepoetin (ARANESP) injection - DIALYSIS  200 mcg Intravenous Q Thu-HD  . diphenhydrAMINE  25 mg Oral Once  . fentaNYL  25 mcg Transdermal Q72H  . gabapentin  300 mg Oral QHS  . Gerhardt's butt cream   Topical BID  .  heparin  5,000 Units Subcutaneous Q8H  .  HYDROmorphone (DILAUDID) injection  0.5 mg Intravenous Once  . insulin aspart  0-9 Units Subcutaneous Q4H  . mouth rinse  15 mL Mouth Rinse q12n4p  . sodium chloride flush  3 mL Intravenous Q12H  . sodium chloride flush  3 mL Intravenous Q12H   Continuous Infusions: . sodium chloride 10 mL/hr at 10/17/17 0600  . sodium chloride Stopped (10/17/17 2100)  . sodium chloride    . sodium chloride    . sodium chloride    . sodium chloride    . sodium chloride       LOS: 9 days    Time spent    Zannie Cove, MD [facility] Page via [email or web address] password [email or web address]

## 2017-10-25 ENCOUNTER — Encounter (HOSPITAL_COMMUNITY): Payer: Self-pay | Admitting: Vascular Surgery

## 2017-10-25 LAB — GLUCOSE, CAPILLARY
GLUCOSE-CAPILLARY: 110 mg/dL — AB (ref 70–99)
Glucose-Capillary: 108 mg/dL — ABNORMAL HIGH (ref 70–99)
Glucose-Capillary: 121 mg/dL — ABNORMAL HIGH (ref 70–99)
Glucose-Capillary: 74 mg/dL (ref 70–99)
Glucose-Capillary: 78 mg/dL (ref 70–99)
Glucose-Capillary: 88 mg/dL (ref 70–99)

## 2017-10-25 MED ORDER — FENTANYL 25 MCG/HR TD PT72: 25.0000 ug | MEDICATED_PATCH | TRANSDERMAL | 0 refills | Status: DC

## 2017-10-25 MED ORDER — POLYETHYLENE GLYCOL 3350 17 G PO PACK: 17.0000 g | PACK | Freq: Every day | ORAL | 0 refills | Status: AC

## 2017-10-25 MED ORDER — GABAPENTIN 300 MG PO CAPS: 300.0000 mg | ORAL_CAPSULE | Freq: Every day | ORAL | Status: AC

## 2017-10-25 MED ORDER — HYDROMORPHONE HCL 2 MG PO TABS: 2.0000 mg | ORAL_TABLET | ORAL | 0 refills | Status: DC | PRN

## 2017-10-25 MED ORDER — ATORVASTATIN CALCIUM 20 MG PO TABS: 20.0000 mg | ORAL_TABLET | Freq: Every day | ORAL | Status: AC

## 2017-10-25 NOTE — Discharge Summary (Addendum)
Physician Discharge Summary  [name] OZH:[id number]:[id number] DOB: [birth year] DOA: 10/15/2017  PCP: [name], J Stewart, MD  Admit date: 10/15/2017 Discharge date: 10/26/2017  Time spent: 45 minutes  Recommendations for Outpatient Follow-up:  1. SNF with palliative care follow-up, she will need hospice as she declines, wants to continue hemodialysis for now   Discharge Diagnoses:    Aspiration pneumonia   Dysphagia   Peripheral arterial disease with gangrenous changes in both feet   Sepsis (HCC)   Altered mental status   ESRD on dialysis (HCC)   Persistent atrial fibrillation (HCC)   Dry gangrene (HCC)   Oropharyngeal dysphagia   DNR (do not resuscitate)   Pain of great toe   Discharge Condition: poor  Diet recommendation: dysphagia 1 diet with nectar thick liquids  Filed Weights   10/24/17 0419 10/24/17 0503 10/25/17 0400  Weight: 55 kg 54.9 kg 55.5 kg    History of present illness:  84 yo female who presented with altered mental status. She does have the significant medical history of ESRD on HD, HTN, CVA, diastolic heart failure, CAD and COPD. Recent hospitalization forAcinetobacter pneumonia. Discharged (07/20) on Imipenem that she did not complete due to declined IV access at the SNF. Head CT without atrophy, no acute changes. Chest x-ray with right lower lobe alveolar/interstitial infiltrate.Patient was admitted to hospital with working diagnosis of right lower lobe pneumonia -Noted to have peripheral vascular disease with gangrenous changes in both feet  Hospital Course:   Aspiration pneumonia (present on admission). -admitted with progressive weakness, shortness of breath and chest x-ray noting worsening right lower lobe infiltrate -had been on IV imipenem from prior to admission for pneumonia, this was changed to meropenem on admission,  -completed 7day course, Abx Stopped - remains at risk for aspiration due to sedation from narcotics-for ischemic pain -SLP evaluation  completed, noted to have dysphagia currently on dysphagia 1 diet with nectar thick liquids -stable now, but always at risk for declining  PAD with gangrenous changes in both toes -through this hospitalization complained of severe pain in both feet right greater than left noted to have gangrenous changes in the toes, vascular surgery and palliative medicine was consulted, after much discussion she underwent aortogram 8/9-diffuse calcific atherosclerosis, occluded posterior tibials bilaterally, failed attempt at right tibial cannulation -per vascular surgeons, no endovascular options for revascularization and she is not a candidate for open vascular bypass, only option would be amputation for progressive gangrenous changes. -I called and discussed with daughter/PoA Nicole Cox, explained vascular recommendations, plan at this time is to continue narcotics, palliative measures, continue dialysis as tolerated. They do not want her to have any surgeries including amputations at this time. -Discharge back to SNF with palliative care follow-up and dialysis, she is agreeable to consider hospice down the road at SNF as she deteriorates, explained that ability to tolerate HD will decrease, and overall prognosis remains very poor -continue aspirin and statin for the time being  3. Acute metabolic encephalopathy -due to above infection and narcotics -stable now, fentanyl dose and gabapentin decreased  3. New onset atrial fibrillation. -now in sinus rhythm, heart rate better controlled now -overall prognosis is very poor, added aspirin  4. ESRD on HDwith anemia of chronic renal disease -overall prognosis very poor given ongoing multisystem decline -renal and palliative medicine following  5. COPD with pulmonary hypertension -stable, continearfomoterol and budesonide.  6. T2DM.  -stable, continue sliding scale insulin  Ethics: this is a very frail chronically ill patient with ESRD, COPD,  A. Fib, dysphagia, aspiration pneumonia, severe peripheral vascular disease with bilateral gangrene -palliative medicine consulted, now DO NOT RESUSCITATE -discharge back to SNF tomorrow with palliative care -Explained very poor overall prognosis to daughter, will need Hospice very soon  Consultants:  vascular surgery  Palliative medicine  Renal  Procedures:  aortogram 8/9   Discharge Exam: Vitals:   10/25/17 1228 10/25/17 1336  BP: (!) 170/58 (!) 188/60  Pulse: 96   Resp: 20   Temp: 98.2 F (36.8 C)   SpO2: 96%     General: alert, awake, oriented to self only Cardiovascular: S1-S2/regular rate rhythm Respiratory: clear bilaterally  Discharge Instructions   Discharge Instructions    Discharge instructions   Complete by:  As directed    Renal Diet   Increase activity slowly   Complete by:  As directed      Allergies as of 10/25/2017   No Known Allergies     Medication List    STOP taking these medications   cloNIDine 0.1 MG tablet Commonly known as:  CATAPRES   cloNIDine 0.2 mg/24hr patch Commonly known as:  CATAPRES - Dosed in mg/24 hr   diphenhydrAMINE 25 mg capsule Commonly known as:  BENADRYL   insulin detemir 100 UNIT/ML injection Commonly known as:  LEVEMIR   lisinopril 40 MG tablet Commonly known as:  PRINIVIL,ZESTRIL   nitroGLYCERIN 0.4 MG SL tablet Commonly known as:  NITROSTAT   pravastatin 40 MG tablet Commonly known as:  PRAVACHOL     TAKE these medications   acetaminophen 325 MG tablet Commonly known as:  TYLENOL Take 650 mg by mouth every 6 (six) hours as needed.   albuterol 108 (90 Base) MCG/ACT inhaler Commonly known as:  PROVENTIL HFA;VENTOLIN HFA Inhale 2 puffs into the lungs every 6 (six) hours as needed for wheezing or shortness of breath.   aspirin EC 81 MG tablet Take 81 mg by mouth daily.   atorvastatin 20 MG tablet Commonly known as:  LIPITOR Take 1 tablet (20 mg total) by mouth daily at 6 PM.    doxercalciferol 2.5 MCG capsule Commonly known as:  HECTOROL Take 1 capsule (2.5 mcg total) by mouth Every Tuesday,Thursday,and Saturday with dialysis.   feeding supplement (NEPRO CARB STEADY) Liqd Take 237 mLs by mouth 2 (two) times daily between meals.   fentaNYL 25 MCG/HR patch Commonly known as:  DURAGESIC - dosed mcg/hr Place 1 patch (25 mcg total) onto the skin every 3 (three) days. Start taking on:  10/27/2017   fluticasone furoate-vilanterol 100-25 MCG/INH Aepb Commonly known as:  BREO ELLIPTA Inhale 1 puff into the lungs daily.   gabapentin 300 MG capsule Commonly known as:  NEURONTIN Take 1 capsule (300 mg total) by mouth at bedtime. What changed:    medication strength  how much to take  when to take this   HYDROmorphone 2 MG tablet Commonly known as:  DILAUDID Take 1 tablet (2 mg total) by mouth every 4 (four) hours as needed for moderate pain or severe pain.   INCRUSE ELLIPTA 62.5 MCG/INH Aepb Generic drug:  umeclidinium bromide Inhale 1 puff into the lungs daily.   isosorbide mononitrate 30 MG 24 hr tablet Commonly known as:  IMDUR Take 30 mg by mouth every evening.   lidocaine-prilocaine cream Commonly known as:  EMLA Apply 1 application topically every other day.   Melatonin 3 MG Tabs Take 3 mg by mouth at bedtime.   metoprolol tartrate 25 MG tablet Commonly known as:  LOPRESSOR Take 0.5  tablets (12.5 mg total) by mouth 2 (two) times daily. What changed:  when to take this   NIFEdipine 60 MG 24 hr tablet Commonly known as:  PROCARDIA XL/ADALAT-CC Take 60 mg by mouth 2 (two) times daily.   polyethylene glycol packet Commonly known as:  MIRALAX / GLYCOLAX Take 17 g by mouth daily.      No Known Allergies    The results of significant diagnostics from this hospitalization (including imaging, microbiology, ancillary and laboratory) are listed below for reference.    Significant Diagnostic Studies: Dg Chest 1 View  Result Date:  09/28/2017 CLINICAL DATA:  Hypoxia. EXAM: CHEST  1 VIEW COMPARISON:  09/26/2017. FINDINGS: Cardiomegaly with diffuse bilateral pulmonary interstitial prominence consistent with pulmonary interstitial edema and/or pneumonitis. A component these changes may be chronic. Exam unchanged from prior exam. Right base calcified pleural plaque is unchanged. No pneumothorax. IMPRESSION: Cardiomegaly. Diffuse bilateral from interstitial prominence is unchanged from prior exam as described above. Electronically Signed   By: Maisie Fus  Register   On: 09/28/2017 15:55   Dg Chest 1 View  Result Date: 09/26/2017 CLINICAL DATA:  Hypoxia EXAM: CHEST  1 VIEW COMPARISON:  06/07/2017 FINDINGS: Diffuse bilateral airspace disease with interval improvement likely due to edema. No significant effusion. Cardiac enlargement. Large calcific pleural plaque in the right lung base is unchanged. IMPRESSION: Improvement in pulmonary edema pattern. Electronically Signed   By: Marlan Palau M.D.   On: 09/26/2017 12:03   Ct Head Wo Contrast  Result Date: 10/15/2017 CLINICAL DATA:  Altered level of consciousness. EXAM: CT HEAD WITHOUT CONTRAST TECHNIQUE: Contiguous axial images were obtained from the base of the skull through the vertex without intravenous contrast. COMPARISON:  CT head 07/27/2017 FINDINGS: Brain: Mild atrophy. Moderate chronic microvascular ischemic change in the white matter. Negative for acute infarct, hemorrhage, mass. No change from the prior study. Empty sella incidentally noted. Vascular: Negative for hyperdense vessel Skull: Negative Sinuses/Orbits: Interval development of right mastoid effusion and middle ear effusion. Left mastoid sinus clear. Paranasal sinuses clear. Bilateral cataract surgery. No orbital mass. Other: None IMPRESSION: Atrophy and chronic microvascular ischemia. No acute intracranial abnormality Interval development of right mastoid and middle ear effusion. Electronically Signed   By: Marlan Palau M.D.    On: 10/15/2017 11:13   Dg Chest Port 1 View  Result Date: 10/16/2017 CLINICAL DATA:  Pneumonia. EXAM: PORTABLE CHEST 1 VIEW COMPARISON:  10/15/2017 FINDINGS: The cardiac silhouette remains enlarged. Diffusely increased interstitial markings are similar to the prior study. Calcified pleural plaques are again noted in the right lower hemithorax. There is also mild pleural thickening or calcification in the right lung apex. No sizable pleural effusion or pneumothorax is identified. IMPRESSION: Unchanged diffuse interstitial densities which may reflect mild edema superimposed on chronic lung disease. Electronically Signed   By: Sebastian Ache M.D.   On: 10/16/2017 08:03   Dg Chest Portable 1 View  Result Date: 10/15/2017 CLINICAL DATA:  Shortness of breath. EXAM: PORTABLE CHEST 1 VIEW COMPARISON:  10/01/2017 FINDINGS: The heart is enlarged. There is tortuosity and calcification of the thoracic aorta. Extensive interstitial changes appear relatively stable and could reflect interstitial disease or interstitial edema. No focal infiltrates. Dense calcifications at the right lung base likely pleural calcifications related to prior hemothorax or empyema. No definite pleural effusions. Chest CT may be helpful for further evaluation. IMPRESSION: Cardiac enlargement and probable interstitial edema superimposed on chronic lung disease. CT may be helpful for further evaluation. Electronically Signed   By: Demetrius Charity.  Gallerani M.D.   On: 10/15/2017 09:42   Dg Chest Port 1 View  Result Date: 10/01/2017 CLINICAL DATA:  Pneumonia. EXAM: PORTABLE CHEST 1 VIEW COMPARISON:  09/28/2017. FINDINGS: Stable cardiomegaly. Stable diffuse bilateral interstitial prominence consistent with interstitial edema and/or pneumonitis. A component these changes may be chronic. Exam is unchanged from prior exam. Stable right base calcified pleural plaque is again noted. No pneumothorax. Surgical clips upper abdomen. IMPRESSION: 1.  Stable cardiomegaly.  2. Stable diffuse bilateral interstitial edema and/or pneumonitis. A component interstitial changes may be chronic. Stable right base calcified pleural plaque. Chest is unchanged from prior exam. Electronically Signed   By: Maisie Fus  Register   On: 10/01/2017 07:57   Dg Chest Portable 1 View  Result Date: 09/25/2017 CLINICAL DATA:  Shortness of breath. EXAM: PORTABLE CHEST 1 VIEW COMPARISON:  07/27/2017 FINDINGS: Midline trachea. Moderate cardiomegaly, accentuated by low lung volumes and AP portable technique. Atherosclerosis in the transverse aorta. No definite pleural fluid. The Chin overlies the apices minimally. No pneumothorax. Moderate pulmonary interstitial prominence and indistinctness, similar. Patchy bibasilar airspace disease is not significantly changed. More focal increased density projecting over the right lung base likely corresponds to a calcified pleural plaque when correlated with the lateral radiograph of 08/14/2012. IMPRESSION: 1. No change in moderate congestive heart failure. 2. Similar bibasilar Airspace disease, likely atelectasis. 3. Calcified right pleural plaque, possibly related to prior empyema or hemothorax. 4.  Aortic Atherosclerosis (ICD10-I70.0). Electronically Signed   By: Jeronimo Greaves M.D.   On: 09/25/2017 19:01   Dg Swallowing Func-speech Pathology  Result Date: 09/30/2017 Objective Swallowing Evaluation: Type of Study: MBS-Modified Barium Swallow Study  Patient Details Name: Elliette Seabolt MRN: [id number] Date of Birth: [birth year] Today's Date: 09/30/2017 Time: SLP Start Time (ACUTE ONLY): 7829 -SLP Stop Time (ACUTE ONLY): 0958 SLP Time Calculation (min) (ACUTE ONLY): 19 min Past Medical History: Past Medical History: Diagnosis Date . CHF (congestive heart failure) (HCC)  . COPD (chronic obstructive pulmonary disease) (HCC)  . Coronary artery disease  . Diabetes mellitus without complication (HCC)  . Heart disease  . Hypertension  . Renal disorder  . Stroke [facility])  Past  Surgical History: Past Surgical History: Procedure Laterality Date . ABDOMINAL HYSTERECTOMY   . CHOLECYSTECTOMY   . TEE WITHOUT CARDIOVERSION N/A 08/02/2017  Procedure: TRANSESOPHAGEAL ECHOCARDIOGRAM (TEE);  Surgeon: Lars Masson, MD;  Location: [facility] ENDOSCOPY;  Service: Cardiovascular;  Laterality: N/A; . TONSILLECTOMY   HPI: Pt is an 84 y.o. female who presents from SNF with AMS, SOB after dialysis. Outpatient CXR showed PNA. Pt denies any h/o dysphagia but is on a dysphagia diet per MD note and although she believes she has had PNA before she is not sure when this was. PMH: CHF, CAD, COPD, diabetes, HTN, ESRD, CVA  Subjective: pt alert, hungry/thirsty Assessment / Plan / Recommendation CHL IP CLINICAL IMPRESSIONS 09/30/2017 Clinical Impression Pt has a mild oropharyngeal dysphagia also impacted by cognitive status with impulsive rate of intake and inconsistent command following. Orally she has slow mastication due to missing dentition, delayed oral transit with lingual residue noted with purees, and premature spillage of liquids. Oral containment is not facilitate by a chin tuck, and she does not slow her rate or take single sips despite cueing from SLP. Thin liquids and nectar thick liquids by cup reach her pyriform sinuses before the swallow and are penetrated to the vocal folds without attempts to clear. Mod cues from SLP for volitional cough are not fully effective at clearing the laryngeal  vestibule. While this is not necessarily abnormal for her age, her compromised respiratory system and decreased functional reserve from acute illness would make her more susceptible to negative side effects from aspiration at this time. Straw sips of nectar thick liquids spill only to the vallecula, allowing for better airway protection during the swallow. Recommend starting Dys 2 diet and nectar thick liquids via straw. Will f/u for tolerance and potential to advance as she recovers from her acute hospitalization. SLP  Visit Diagnosis Dysphagia, oropharyngeal phase (R13.12) Attention and concentration deficit following -- Frontal lobe and executive function deficit following -- Impact on safety and function Moderate aspiration risk   CHL IP TREATMENT RECOMMENDATION 09/30/2017 Treatment Recommendations Therapy as outlined in treatment plan below   Prognosis 09/30/2017 Prognosis for Safe Diet Advancement Good Barriers to Reach Goals Cognitive deficits Barriers/Prognosis Comment -- CHL IP DIET RECOMMENDATION 09/30/2017 SLP Diet Recommendations Dysphagia 2 (Fine chop) solids;Nectar thick liquid Liquid Administration via Straw Medication Administration Whole meds with puree Compensations Minimize environmental distractions;Slow rate;Small sips/bites Postural Changes Seated upright at 90 degrees;Remain semi-upright after after feeds/meals (Comment)   CHL IP OTHER RECOMMENDATIONS 09/30/2017 Recommended Consults -- Oral Care Recommendations Oral care BID Other Recommendations --   CHL IP FOLLOW UP RECOMMENDATIONS 09/30/2017 Follow up Recommendations Skilled Nursing facility   [facility] IP FREQUENCY AND DURATION 09/30/2017 Speech Therapy Frequency (ACUTE ONLY) min 2x/week Treatment Duration 2 weeks      CHL IP ORAL PHASE 09/30/2017 Oral Phase Impaired Oral - Pudding Teaspoon -- Oral - Pudding Cup -- Oral - Honey Teaspoon -- Oral - Honey Cup -- Oral - Nectar Teaspoon -- Oral - Nectar Cup Premature spillage;Decreased bolus cohesion Oral - Nectar Straw Decreased bolus cohesion;Premature spillage Oral - Thin Teaspoon -- Oral - Thin Cup Decreased bolus cohesion;Premature spillage Oral - Thin Straw Decreased bolus cohesion;Premature spillage Oral - Puree Lingual/palatal residue Oral - Mech Soft Impaired mastication;Delayed oral transit Oral - Regular -- Oral - Multi-Consistency -- Oral - Pill -- Oral Phase - Comment --  CHL IP PHARYNGEAL PHASE 09/30/2017 Pharyngeal Phase Impaired Pharyngeal- Pudding Teaspoon -- Pharyngeal -- Pharyngeal- Pudding Cup --  Pharyngeal -- Pharyngeal- Honey Teaspoon -- Pharyngeal -- Pharyngeal- Honey Cup -- Pharyngeal -- Pharyngeal- Nectar Teaspoon -- Pharyngeal -- Pharyngeal- Nectar Cup Delayed swallow initiation-pyriform sinuses;Penetration/Aspiration before swallow Pharyngeal Material enters airway, CONTACTS cords and not ejected out Pharyngeal- Nectar Straw Delayed swallow initiation-vallecula Pharyngeal -- Pharyngeal- Thin Teaspoon -- Pharyngeal -- Pharyngeal- Thin Cup Delayed swallow initiation-pyriform sinuses;Penetration/Aspiration before swallow;Compensatory strategies attempted (with notebox) Pharyngeal Material enters airway, CONTACTS cords and not ejected out Pharyngeal- Thin Straw Delayed swallow initiation-pyriform sinuses;Penetration/Aspiration before swallow Pharyngeal Material enters airway, passes BELOW cords without attempt by patient to eject out (silent aspiration) Pharyngeal- Puree WFL Pharyngeal -- Pharyngeal- Mechanical Soft WFL Pharyngeal -- Pharyngeal- Regular -- Pharyngeal -- Pharyngeal- Multi-consistency -- Pharyngeal -- Pharyngeal- Pill -- Pharyngeal -- Pharyngeal Comment --  CHL IP CERVICAL ESOPHAGEAL PHASE 09/30/2017 Cervical Esophageal Phase WFL Pudding Teaspoon -- Pudding Cup -- Honey Teaspoon -- Honey Cup -- Nectar Teaspoon -- Nectar Cup -- Nectar Straw -- Thin Teaspoon -- Thin Cup -- Thin Straw -- Puree -- Mechanical Soft -- Regular -- Multi-consistency -- Pill -- Cervical Esophageal Comment -- No flowsheet data found. Maxcine Ham 09/30/2017, 11:01 AM  Maxcine Ham, M.A. CCC-SLP [phone]              Microbiology: Recent Results (from the past 240 hour(s))  MRSA PCR Screening     Status: None   Collection  Time: 10/15/17  2:47 PM  Result Value Ref Range Status   MRSA by PCR NEGATIVE NEGATIVE Final    Comment:        The GeneXpert MRSA Assay (FDA approved for NASAL specimens only), is one component of a comprehensive MRSA colonization surveillance program. It is not intended to  diagnose MRSA infection nor to guide or monitor treatment for MRSA infections. Performed at [facility], [address] [place], [place] [address]   Culture, blood (x 2)     Status: None   Collection Time: 10/15/17  3:15 PM  Result Value Ref Range Status   Specimen Description BLOOD RIGHT HAND  Final   Special Requests   Final    BOTTLES DRAWN AEROBIC ONLY Blood Culture results may not be optimal due to an inadequate volume of blood received in culture bottles   Culture   Final    NO GROWTH 5 DAYS Performed at [facility], [address] [place], [place] [address]    Report Status 10/20/2017 FINAL  Final     Labs: Basic Metabolic Panel: Recent Labs  Lab 10/19/17 0414 10/20/17 0538 10/22/17 0449 10/23/17 0325 10/23/17 1037 10/24/17 0045  NA 137 137 138 134* 136 131*  K 3.8 4.0 4.5 5.0 5.5* 5.0  CL 96* 98 95* 94* 95* 91*  CO2 28 27 31 28 28 26   GLUCOSE 89 145* 76 66* 85 91  BUN 12 12 6* 13 15 19   CREATININE 3.47* 3.01* 2.76* 4.21* 4.69* 5.77*  CALCIUM 8.7* 8.8* 8.6* 8.7* 9.2 8.8*  PHOS 3.6  --   --   --  5.8* 6.5*   Liver Function Tests: Recent Labs  Lab 10/19/17 0414 10/23/17 1037 10/24/17 0045  ALBUMIN 2.4* 2.4* 2.2*   No results for input(s): LIPASE, AMYLASE in the last 168 hours. No results for input(s): AMMONIA in the last 168 hours. CBC: Recent Labs  Lab 10/19/17 0414 10/20/17 0538 10/22/17 0449 10/23/17 0325 10/23/17 1037 10/24/17 0045  WBC 16.2* 15.5* 13.0* 11.5* 12.2* 11.6*  NEUTROABS 12.2* 11.6*  --   --   --   --   HGB 9.1* 9.2* 9.5* 8.9* 10.2* 8.6*  HCT 30.8* 31.1* 32.4* 30.2* 34.7* 29.3*  MCV 88.0 88.4 89.3 88.0 88.3 87.5  PLT 261 332 328 306 315 321   Cardiac Enzymes: No results for input(s): CKTOTAL, CKMB, CKMBINDEX, TROPONINI in the last 168 hours. BNP: BNP (last 3 results) Recent Labs    06/07/17 1000 09/25/17 2145  BNP 1,685.3* 2,183.6*    ProBNP (last 3 results) No results for input(s): PROBNP in the last  8760 hours.  CBG: Recent Labs  Lab 10/24/17 2004 10/25/17 0014 10/25/17 0554 10/25/17 0753 10/25/17 1219  GLUCAP 131* 121* 88 74 108*       Signed:  Zannie CovePreetha Spruha Weight MD.  [facility] 10/25/2017, 2:43 PM

## 2017-10-25 NOTE — Progress Notes (Signed)
Dialysis Orders: [facility]/ Dr Louis MeckelStovall TTS 3h 121lbs 2/2.5 Hep 2000 then 250 / hr AVG  - mircera150 q 2 weeks- last given 7/25;last hgb at center 8.7 -hect 1 mcg IV q tx  Summary: 85yo chronically ill female w/recent sepsis - now returning 2 weeks later w/dec MS, elevated WBC and lactate concerning for sepsis.  Assessment/Plan: 1. 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 Conservative mgmt at present 2. Asp PNA/sepsis - s/p Abs 3. ESRD -TTS HD.  4. GOC-  Now DNR. Wishes to continue HD. Frail w/poor prognosis overall.  Subjective: Interval History: No new issues. To cont. HD.  Objective: Vital signs in last 24 hours: Temp:  [98.1 F (36.7 Cox)-98.3 F (36.8 Cox)] 98.2 F (36.8 Cox) (08/12 1228) Pulse Rate:  [83-97] 96 (08/12 1228) Resp:  [16-20] 20 (08/12 1228) BP: (142-188)/(45-60) 188/60 (08/12 1336) SpO2:  [94 %-100 %] 96 % (08/12 1228) Weight:  [55.5 kg] 55.5 kg (08/12 0400) Weight change: -0.1 kg  Intake/Output from previous day: 08/11 0701 - 08/12 0700 In: 755 [P.O.:755] Out: 0  Intake/Output this shift: Total I/O In: 492 [P.O.:480; I.V.:12] Out: -   General appearance: alert Chest wall: no tenderness Extremities: extremities normal, atraumatic, no cyanosis or edema  LUE AVGG Frail apearance Some confusion  Lab Results: Recent Labs    10/23/17 1037 10/24/17 0045  WBC 12.2* 11.6*  HGB 10.2* 8.6*  HCT 34.7* 29.3*  PLT 315 321   BMET:  Recent Labs    10/23/17 1037 10/24/17 0045  NA 136 131*  K 5.5* 5.0  CL 95* 91*  CO2 28 26  GLUCOSE 85 91  BUN 15 19  CREATININE 4.69* 5.77*  CALCIUM 9.2 8.8*   No results for input(s): PTH in the last 72 hours. Iron Studies: No results for input(s): IRON, TIBC, TRANSFERRIN, FERRITIN in the last 72 hours. Studies/Results: No results found.  Scheduled: . arformoterol  15 mcg Nebulization BID  . aspirin EC  81 mg Oral Daily  . atorvastatin  20 mg Oral q1800  . budesonide (PULMICORT)  nebulizer solution  0.5 mg Nebulization BID  . chlorhexidine  15 mL Mouth Rinse BID  . Chlorhexidine Gluconate Cloth  6 each Topical Q0600  . darbepoetin (ARANESP) injection - DIALYSIS  200 mcg Intravenous Q Thu-HD  . fentaNYL  25 mcg Transdermal Q72H  . gabapentin  300 mg Oral QHS  . Gerhardt's butt cream   Topical BID  . heparin  5,000 Units Subcutaneous Q8H  . insulin aspart  0-9 Units Subcutaneous Q4H  . mouth rinse  15 mL Mouth Rinse q12n4p  . sodium chloride flush  3 mL Intravenous Q12H  . sodium chloride flush  3 mL Intravenous Q12H     LOS: 10 days   Nicole Cox Nicole Cox 10/25/2017,3:20 PM

## 2017-10-25 NOTE — Progress Notes (Signed)
Pt has been somnolent.  Attempted to feed dinner, but pt did not swallow.  Hinton DyerYoko Jaylene Schrom, RN

## 2017-10-25 NOTE — Evaluation (Signed)
Physical Therapy Evaluation Patient Details Name: Nicole Cox MRN: [id number] DOB: 1931/09/04 Today's Date: 10/25/2017   History of Present Illness  THis 84 y.o. female admitted with AMS.  Dx:  Aspiration PNA, PAD with gangrenous changes both toes > not a candidate for revascularization, acute metabolic enephalopathy, new onset A-FIb, ESRD on HD, COPD with pulmonary HTN.  PMH includes:  CVA, HTN ESRD, CAD CHF,   Clinical Impression  Pt admitted with above.  SHe demonstrates the below listed deficits.  She currently requires maxA for functional mobility.  She was very lethargic during eval.  PTA, she was in SNF for rehab and required assist for ADLs and was in w/c or bed predominately.   Recommend return to SNF for continued rehab to maximize her independence with function.  Acute PT to cont to follow to progress OOB mobility.       Follow Up Recommendations SNF;Supervision for mobility/OOB(return to [facility])    Equipment Recommendations  None recommended by PT    Recommendations for Other Services       Precautions / Restrictions Precautions Precautions: Fall Restrictions Weight Bearing Restrictions: No      Mobility  Bed Mobility Overal bed mobility: Needs Assistance Bed Mobility: Supine to Sit;Sit to Supine     Supine to sit: Max assist Sit to supine: Total assist   General bed mobility comments: pt with no initiation of transfer or task asked  Transfers Overall transfer level: Needs assistance Equipment used: 1 person hand held assist Transfers: Sit to/from Stand Sit to Stand: Max assist         General transfer comment: deferred at this time due to lethargy and strong L lateral lean  Ambulation/Gait                Stairs            Wheelchair Mobility    Modified Rankin (Stroke Patients Only)       Balance Overall balance assessment: Needs assistance Sitting-balance support: Feet supported;No upper extremity supported Sitting  balance-Leahy Scale: Poor Sitting balance - Comments: pt with strong L Lateral lean, minimal eye opening, lethargic Postural control: Posterior lean Standing balance support: Single extremity supported Standing balance-Leahy Scale: Poor Standing balance comment: max a                              Pertinent Vitals/Pain Pain Assessment: Faces Faces Pain Scale: Hurts little more Pain Location: R foot when donning sock Pain Descriptors / Indicators: Grimacing Pain Intervention(s): Monitored during session    Home Living Family/patient expects to be discharged to:: Skilled nursing facility                 Additional Comments: Pt has been at SNF for rehab since end of May, was at home before that    Prior Function Level of Independence: Needs assistance   Gait / Transfers Assistance Needed: per dtr, pt was standing up and taking a few steps but was predominately in w/c or bed  ADL's / Homemaking Assistance Needed: Pt was requiring assist for all ADLs per daughter         Hand Dominance   Dominant Hand: Right    Extremity/Trunk Assessment   Upper Extremity Assessment Upper Extremity Assessment: Generalized weakness    Lower Extremity Assessment Lower Extremity Assessment: Generalized weakness    Cervical / Trunk Assessment Cervical / Trunk Assessment: Kyphotic  Communication   Communication: Expressive  difficulties(limited interaction )  Cognition Arousal/Alertness: Lethargic Behavior During Therapy: Flat affect Overall Cognitive Status: Impaired/Different from baseline Area of Impairment: Orientation;Attention;Following commands                 Orientation Level: Disoriented to;Place;Time;Situation Current Attention Level: Focused Memory: Decreased short-term memory Following Commands: Follows one step commands with increased time Safety/Judgement: Decreased awareness of deficits Awareness: Intellectual Problem Solving: Slow  processing;Decreased initiation;Difficulty sequencing;Requires verbal cues;Requires tactile cues General Comments: pt with minimal interaction, responded to daughter better      General Comments General comments (skin integrity, edema, etc.): VSS, dtr reports shes like this due to pain medication    Exercises     Assessment/Plan    PT Assessment Patient needs continued PT services  PT Problem List Decreased strength;Decreased activity tolerance;Decreased balance;Decreased mobility;Decreased cognition;Decreased knowledge of use of DME;Cardiopulmonary status limiting activity       PT Treatment Interventions DME instruction;Gait training;Functional mobility training;Therapeutic activities;Therapeutic exercise;Balance training;Patient/family education;Wheelchair mobility training    PT Goals (Current goals can be found in the Care Plan section)  Acute Rehab PT Goals Patient Stated Goal: To improve ability to do for herself  PT Goal Formulation: With family Time For Goal Achievement: 11/08/17 Potential to Achieve Goals: Good    Frequency Min 2X/week   Barriers to discharge        Co-evaluation               AM-PAC PT "6 Clicks" Daily Activity  Outcome Measure Difficulty turning over in bed (including adjusting bedclothes, sheets and blankets)?: Unable Difficulty moving from lying on back to sitting on the side of the bed? : Unable Difficulty sitting down on and standing up from a chair with arms (e.g., wheelchair, bedside commode, etc,.)?: Unable Help needed moving to and from a bed to chair (including a wheelchair)?: Total Help needed walking in hospital room?: Total Help needed climbing 3-5 steps with a railing? : Total 6 Click Score: 6    End of Session Equipment Utilized During Treatment: Gait belt;Oxygen Activity Tolerance: Patient limited by fatigue Patient left: with call bell/phone within reach;in bed;with nursing/sitter in room Nurse Communication: Mobility  status PT Visit Diagnosis: Other abnormalities of gait and mobility (R26.89);Muscle weakness (generalized) (M62.81)    Time: 9604-54091208-1226 PT Time Calculation (min) (ACUTE ONLY): 18 min   Charges:   PT Evaluation $PT Eval Moderate Complexity: 1 Mod          Lewis ShockAshly Lonie Rummell, PT, DPT Pager #: [phone] Office #: [phone]   Rozell Searingshly M Adine Heimann 10/25/2017, 1:57 PM

## 2017-10-25 NOTE — Evaluation (Signed)
Occupational Therapy Evaluation Patient Details Name: Nicole Cox MRN: [id number] DOB: 15-Jul-1931 Today's Date: 10/25/2017    History of Present Illness THis 84 y.o. female admitted with AMS.  Dx:  Aspiration PNA, PAD with gangrenous changes both toes > not a candidate for revascularization, acute metabolic enephalopathy, new onset A-FIb, ESRD on HD, COPD with pulmonary HTN.  PMH includes:  CVA, HTN ESRD, CAD CHF,    Clinical Impression   Pt admitted with above.  SHe demonstrates the below listed deficits.  She currently requires total A for ADLs and max - total A for functional mobility.  She was very lethargic during eval.  PTA, she was in SNF for rehab and required assist for ADLs.  Recommend return to SNF for continued rehab to maximize her independence with ADLs.  All further OT needs can be addressed by SNF OT.   Acute OT will sign off at this time.     Follow Up Recommendations  SNF    Equipment Recommendations  None recommended by OT    Recommendations for Other Services       Precautions / Restrictions Precautions Precautions: Fall      Mobility Bed Mobility Overal bed mobility: Needs Assistance Bed Mobility: Supine to Sit;Sit to Supine     Supine to sit: Max assist Sit to supine: Total assist   General bed mobility comments: assist with all aspects. She will assist with moving LEs off bed   Transfers Overall transfer level: Needs assistance Equipment used: 1 person hand held assist Transfers: Sit to/from Stand Sit to Stand: Max assist         General transfer comment: Pt requires max A to move into partial stand.  Unable to extend hips and knees fully     Balance Overall balance assessment: Needs assistance Sitting-balance support: Feet supported Sitting balance-Leahy Scale: Poor Sitting balance - Comments: Pt requires min A, progressing ot max A for EOB sitting.  Heavy posterior lean  Postural control: Posterior lean Standing balance support: Single  extremity supported Standing balance-Leahy Scale: Poor Standing balance comment: max a                            ADL either performed or assessed with clinical judgement   ADL Overall ADL's : Needs assistance/impaired Eating/Feeding: Total assistance;Sitting;Bed level   Grooming: Wash/dry hands;Wash/dry face;Brushing hair;Total assistance;Sitting Grooming Details (indicate cue type and reason): Pt will initiate activity, but looses focus and can't complete task  Upper Body Bathing: Total assistance;Sitting   Lower Body Bathing: Total assistance;Bed level   Upper Body Dressing : Total assistance;Bed level;Sitting   Lower Body Dressing: Total assistance;Bed level   Toilet Transfer: Total assistance Toilet Transfer Details (indicate cue type and reason): unable with +1 assist  Toileting- Clothing Manipulation and Hygiene: Total assistance;Bed level       Functional mobility during ADLs: Total assistance General ADL Comments: Pt unable to sustain attention to complete task      Vision   Additional Comments: daughter reports pt read the white board in room earlier      Perception     Praxis      Pertinent Vitals/Pain Pain Assessment: Faces Faces Pain Scale: Hurts little more Pain Location: foot, left when touched  Pain Descriptors / Indicators: Moaning Pain Intervention(s): Monitored during session;Limited activity within patient's tolerance     Hand Dominance Right   Extremity/Trunk Assessment Upper Extremity Assessment Upper Extremity Assessment: Generalized weakness  Lower Extremity Assessment Lower Extremity Assessment: Defer to PT evaluation   Cervical / Trunk Assessment Cervical / Trunk Assessment: Kyphotic   Communication Communication Communication: Expressive difficulties(limited interaction )   Cognition   Behavior During Therapy: Flat affect Overall Cognitive Status: Impaired/Different from baseline Area of Impairment:  Orientation;Attention;Following commands                 Orientation Level: Disoriented to;Place;Time;Situation Current Attention Level: Focused           General Comments: Pt will engage with OT inconsistently.  She will answer with one word responses ~30% of time.  She will follow one step commands ~40% of time    General Comments  VSS.  Daughter present     Exercises     Shoulder Instructions      Home Living Family/patient expects to be discharged to:: Skilled nursing facility                                 Additional Comments: Pt has been at SNF for rehab       Prior Functioning/Environment Level of Independence: Needs assistance  Gait / Transfers Assistance Needed: Per daughter, pt was working with therapies, and working on standing and transfers.  Daughter reports frequent admissions has hindered her progress  ADL's / Homemaking Assistance Needed: Pt was requiring assist for all ADLs per daughter             OT Problem List: Decreased strength;Decreased activity tolerance;Impaired balance (sitting and/or standing);Decreased cognition;Decreased safety awareness;Decreased knowledge of use of DME or AE;Impaired UE functional use;Pain      OT Treatment/Interventions: Self-care/ADL training;Therapeutic exercise;DME and/or AE instruction;Therapeutic activities;Patient/family education;Neuromuscular education    OT Goals(Current goals can be found in the care plan section) Acute Rehab OT Goals Patient Stated Goal: To improve ability to do for herself  OT Goal Formulation: With family  OT Frequency:     Barriers to D/C: Decreased caregiver support          Co-evaluation              AM-PAC PT "6 Clicks" Daily Activity     Outcome Measure Help from another person eating meals?: Total Help from another person taking care of personal grooming?: Total Help from another person toileting, which includes using toliet, bedpan, or urinal?:  Total Help from another person bathing (including washing, rinsing, drying)?: Total Help from another person to put on and taking off regular upper body clothing?: Total Help from another person to put on and taking off regular lower body clothing?: Total 6 Click Score: 6   End of Session Equipment Utilized During Treatment: Oxygen Nurse Communication: Mobility status  Activity Tolerance: Patient limited by fatigue Patient left: in bed;with call bell/phone within reach;with bed alarm set;with nursing/sitter in room;with family/visitor present  OT Visit Diagnosis: Unsteadiness on feet (R26.81);Cognitive communication deficit (R41.841)                Time: 1610-96041254-1324 OT Time Calculation (min): 30 min Charges:  OT General Charges $OT Visit: 1 Visit OT Evaluation $OT Eval Moderate Complexity: 1 Mod OT Treatments $Therapeutic Activity: 8-22 mins  Reynolds AmericanWendi Levie Cox, OTR/L [phone]   Nicole Cox, Nicole Cox 10/25/2017, 1:37 PM

## 2017-10-25 NOTE — Progress Notes (Addendum)
2:59 pm [facility]. Awaiting determination. Auth required before patient can admit back to SNF.  11:19 am CSW spoke to patient's daughter, Zachery DauerShakoya Hatcher, on the phone. Daughter wants patient to [facility], and expects patient will be there long term. CSW received message from [facility] that patient has been in their facility under her [employer] benefit, and does not have a long term care benefit (Medicaid is pending). [facility].  However, patient has not been evaluated by PT/OT, which will be needed to re-qualify patient for SNF. Paged MD for therapy eval orders. Patient requires evaluation and [employer] authorization before transition back to SNF. CSW to follow.  Abigail ButtsSusan Alexys Gassett, LCSWA [phone]

## 2017-10-26 LAB — CBC
HCT: 30.7 % — ABNORMAL LOW (ref 36.0–46.0)
Hemoglobin: 9.1 g/dL — ABNORMAL LOW (ref 12.0–15.0)
MCH: 26.4 pg (ref 26.0–34.0)
MCHC: 29.6 g/dL — ABNORMAL LOW (ref 30.0–36.0)
MCV: 89 fL (ref 78.0–100.0)
Platelets: 317 10*3/uL (ref 150–400)
RBC: 3.45 MIL/uL — ABNORMAL LOW (ref 3.87–5.11)
RDW: 20.5 % — ABNORMAL HIGH (ref 11.5–15.5)
WBC: 11.3 10*3/uL — ABNORMAL HIGH (ref 4.0–10.5)

## 2017-10-26 LAB — RENAL FUNCTION PANEL
Albumin: 2.1 g/dL — ABNORMAL LOW (ref 3.5–5.0)
Albumin: 2.2 g/dL — ABNORMAL LOW (ref 3.5–5.0)
Anion gap: 11 (ref 5–15)
Anion gap: 12 (ref 5–15)
BUN: 21 mg/dL (ref 8–23)
BUN: 22 mg/dL (ref 8–23)
CO2: 27 mmol/L (ref 22–32)
CO2: 28 mmol/L (ref 22–32)
Calcium: 8.8 mg/dL — ABNORMAL LOW (ref 8.9–10.3)
Calcium: 8.9 mg/dL (ref 8.9–10.3)
Chloride: 97 mmol/L — ABNORMAL LOW (ref 98–111)
Chloride: 98 mmol/L (ref 98–111)
Creatinine, Ser: 6.22 mg/dL — ABNORMAL HIGH (ref 0.44–1.00)
Creatinine, Ser: 6.39 mg/dL — ABNORMAL HIGH (ref 0.44–1.00)
GFR calc Af Amer: 6 mL/min — ABNORMAL LOW (ref >60)
GFR calc Af Amer: 6 mL/min — ABNORMAL LOW (ref >60)
GFR calc non Af Amer: 5 mL/min — ABNORMAL LOW (ref >60)
GFR calc non Af Amer: 6 mL/min — ABNORMAL LOW (ref >60)
Glucose, Bld: 70 mg/dL (ref 70–99)
Glucose, Bld: 72 mg/dL (ref 70–99)
Phosphorus: 5.9 mg/dL — ABNORMAL HIGH (ref 2.5–4.6)
Phosphorus: 6.2 mg/dL — ABNORMAL HIGH (ref 2.5–4.6)
Potassium: 5 mmol/L (ref 3.5–5.1)
Potassium: 5.1 mmol/L (ref 3.5–5.1)
Sodium: 136 mmol/L (ref 135–145)
Sodium: 137 mmol/L (ref 135–145)

## 2017-10-26 LAB — GLUCOSE, CAPILLARY
GLUCOSE-CAPILLARY: 105 mg/dL — AB (ref 70–99)
GLUCOSE-CAPILLARY: 115 mg/dL — AB (ref 70–99)
GLUCOSE-CAPILLARY: 115 mg/dL — AB (ref 70–99)
GLUCOSE-CAPILLARY: 91 mg/dL (ref 70–99)
Glucose-Capillary: 85 mg/dL (ref 70–99)
Glucose-Capillary: 88 mg/dL (ref 70–99)
Glucose-Capillary: 79 mg/dL (ref 70–99)
Glucose-Capillary: 103 mg/dL — ABNORMAL HIGH (ref 70–99)
Glucose-Capillary: 201 mg/dL — ABNORMAL HIGH (ref 70–99)

## 2017-10-26 MED ORDER — PENTAFLUOROPROP-TETRAFLUOROETH EX AERO: 1.0000 "application " | INHALATION_SPRAY | CUTANEOUS | Status: DC | PRN

## 2017-10-26 MED ORDER — HEPARIN SODIUM (PORCINE) 1000 UNIT/ML DIALYSIS
3000.0000 [IU] | Freq: Once | INTRAMUSCULAR | Status: DC
  Filled 2017-10-26: qty 3

## 2017-10-26 MED ORDER — LIDOCAINE HCL (PF) 1 % IJ SOLN: 5.0000 mL | INTRAMUSCULAR | Status: DC | PRN

## 2017-10-26 MED ORDER — LIDOCAINE-PRILOCAINE 2.5-2.5 % EX CREA
1.0000 "application " | TOPICAL_CREAM | CUTANEOUS | Status: DC | PRN
  Filled 2017-10-26: qty 5

## 2017-10-26 MED ORDER — SODIUM CHLORIDE 0.9 % IV SOLN: 100.0000 mL | INTRAVENOUS | Status: DC | PRN

## 2017-10-26 NOTE — Progress Notes (Signed)
Pt seen, no changes from DC summary dictated 8/12, await authorization to SNF  Zannie CovePreetha Ashantae Pangallo, MD

## 2017-10-26 NOTE — Progress Notes (Signed)
@  2128 pt agitated: refusing all forms of hygeine (bath offered) and endorsing she would call the police on these "fake nurses". Reorientation attempted but pt remains upset and confused demanding to leave hospital. Bed confirmed to be in low position, IVs wrapped, HD site wrapped, and bed alarm set. Side rails up x3 and call bell within reach. Charge RN aware. Will continue to monitor.

## 2017-10-26 NOTE — Clinical Social Work Note (Addendum)
Authorization still pending.  Charlynn CourtSarah Keene Gilkey, CSW [phone]  2:26 pm Insurance requesting peer-to-peer review. Paged MD to provide contact information [phone] for Dr. Freida BusmanAllen).  Charlynn CourtSarah Wm Fruchter, CSW [phone]  4:04 pm Insurance authorization denied for rehab. Admissions coordinator is trying to figure out a way they can bring her back and will follow up with unit CSW tomorrow. CSW notified patient's daughter, [name].  Charlynn CourtSarah Tanish Prien, CSW [phone]

## 2017-10-26 NOTE — Care Management Important Message (Signed)
Important Message  Patient Details  Name: Al DecantLizzie Meckley MRN: [id number] Date of Birth: [birth year]   Medicare Important Message Given:  Yes    Lollie Marrowatalie Y Latondra Gebhart, RN 10/26/2017, 2:15 PM

## 2017-10-26 NOTE — Care Management Note (Signed)
Case Management Note  Patient Details  Name: Nicole Cox MRN: [name] Date of Birth: 09/17/1931  Subjective/Objective:  84 yo female who presented with altered mental status. She does have the significant medical history of ESRD on HD, HTN, CVA, diastolic heart failure, CAD and COPD.                 Action/Plan: SW following for SNF placement with authorization still pending. CM will continue to monitor for additional needs.   Expected Discharge Date:                  Expected Discharge Plan:  Skilled Nursing Facility  In-House Referral:  Clinical Social Work  Discharge planning Services  CM Consult  Post Acute Care Choice:  NA Choice offered to:  NA  DME Arranged:  N/A DME Agency:  NA  HH Arranged:  NA HH Agency:  NA  Status of Service:  Completed, signed off  If discussed at Long Length of Stay Meetings, dates discussed:    Additional Comments:  Lollie Marrowatalie Y Sontee Desena, RN 10/26/2017, 2:21 PM

## 2017-10-26 NOTE — Procedures (Signed)
[name] HD with no hemodynamic issues. K 5.1, Hct 30.7% Awaiting disposition.  Casimiro NeedleAlvin Jamaris Biernat, MD

## 2017-10-27 DIAGNOSIS — R404 Transient alteration of awareness: Secondary | ICD-10-CM

## 2017-10-27 DIAGNOSIS — Z992 Dependence on renal dialysis: Secondary | ICD-10-CM

## 2017-10-27 DIAGNOSIS — I96 Gangrene, not elsewhere classified: Secondary | ICD-10-CM

## 2017-10-27 DIAGNOSIS — R1312 Dysphagia, oropharyngeal phase: Secondary | ICD-10-CM

## 2017-10-27 LAB — GLUCOSE, CAPILLARY
GLUCOSE-CAPILLARY: 76 mg/dL (ref 70–99)
Glucose-Capillary: 93 mg/dL (ref 70–99)
Glucose-Capillary: 124 mg/dL — ABNORMAL HIGH (ref 70–99)

## 2017-10-27 NOTE — Progress Notes (Signed)
Daily Progress Note   Patient Name: Nicole Cox       Date: 10/27/2017 DOB: April 06, 1931  Age: 84 y.o. MRN#: [id number] Attending Physician: Susa GriffinsVasireddy, Padmaja, MD Primary Care Physician: [name], J Stewart, MD Admit Date: 10/15/2017  Reason for Consultation/Follow-up: Pain control and Psychosocial/spiritual support  Subjective: Patient denies pain and reports she is comfortable.  She expresses multiple times that she wants to go home.  She remembers me from last week but states that her children have not been to see her.  She talked with me about the procedure last Friday and explained that there was nothing the doctor could do to improve her toes.   Assessment: Patient appears much more comfortable than last week.     Patient Profile/HPI:   84 y.o.femalewith past medical history of ESRD, atrial fibrillation, CVA, CHF, DM, COPDwho was admitted on 8/2/2019with aspiration pneumonia, afib, and encephalopathy. The patient experiences severe pain in her great toe due to dry gangrene.She has been evaluated by speech who determined her cognitive deficits make her a high risk for aspiration. She developed rapid afib in hemodialysis on 8/4 and was unable to finish her treatment. An additional hemodialysis sessionwas scheduled.  Length of Stay: 12  Current Medications: Scheduled Meds:  . arformoterol  15 mcg Nebulization BID  . aspirin EC  81 mg Oral Daily  . atorvastatin  20 mg Oral q1800  . budesonide (PULMICORT) nebulizer solution  0.5 mg Nebulization BID  . chlorhexidine  15 mL Mouth Rinse BID  . Chlorhexidine Gluconate Cloth  6 each Topical Q0600  . darbepoetin (ARANESP) injection - DIALYSIS  200 mcg Intravenous Q Thu-HD  . fentaNYL  25 mcg Transdermal Q72H  . gabapentin  300 mg  Oral QHS  . Gerhardt's butt cream   Topical BID  . heparin  5,000 Units Subcutaneous Q8H  . insulin aspart  0-9 Units Subcutaneous Q4H  . mouth rinse  15 mL Mouth Rinse q12n4p  . sodium chloride flush  3 mL Intravenous Q12H  . sodium chloride flush  3 mL Intravenous Q12H    Continuous Infusions: . sodium chloride 10 mL/hr at 10/17/17 0600  . sodium chloride Stopped (10/17/17 2100)  . sodium chloride      PRN Meds: sodium chloride, sodium chloride, sodium chloride, acetaminophen, HYDROmorphone, ondansetron (ZOFRAN) IV, RESOURCE THICKENUP CLEAR,  sodium chloride flush, sodium chloride flush  Physical Exam       Elderly female, awake, alert coherent, appears comfortable.  Vital Signs: BP (!) 155/44   Pulse 83   Temp 98.7 F (37.1 C) (Oral)   Resp 15   Ht 5\' 2"  (1.575 m)   Wt 54.7 kg   SpO2 94%   BMI 22.04 kg/m  SpO2: SpO2: 94 % O2 Device: O2 Device: Room Air O2 Flow Rate: O2 Flow Rate (L/min): 1 L/min  Intake/output summary:   Intake/Output Summary (Last 24 hours) at 10/27/2017 1251 Last data filed at 10/27/2017 1237 Gross per 24 hour  Intake 1096 ml  Output -  Net 1096 ml   LBM: Last BM Date: 10/24/17 Baseline Weight: Weight: 59.4 kg Most recent weight: Weight: 54.7 kg       Palliative Assessment/Data: 30%    Flowsheet Rows     Most Recent Value  Intake Tab  Referral Department  Hospitalist  Unit at Time of Referral  ICU  Date Notified  10/16/17  Palliative Care Type  Return patient Palliative Care  Reason for referral  Clarify Goals of Care  Date of Admission  10/15/17  Date first seen by Palliative Care  10/18/17  # of days Palliative referral response time  2 Day(s)  # of days IP prior to Palliative referral  1  Clinical Assessment  Psychosocial & Spiritual Assessment  Palliative Care Outcomes      Patient Active Problem List   Diagnosis Date Noted  . Pain of great toe   . DNR (do not resuscitate)   . Oropharyngeal dysphagia   . Altered mental  status   . ESRD on dialysis (HCC)   . Persistent atrial fibrillation (HCC)   . Dry gangrene (HCC)   . Sepsis (HCC) 10/15/2017  . Delirium 09/25/2017  . Bacteremia   . SIRS (systemic inflammatory response syndrome) (HCC) 07/27/2017  . Malnutrition of moderate degree 06/10/2017  . Pressure injury of skin 06/08/2017  . Acute pulmonary edema (HCC) 06/07/2017  . Respiratory failure with hypoxia (HCC) 12/12/2015  . Acute on chronic respiratory failure with hypoxia (HCC) 12/12/2015  . Fluid overload 12/12/2015  . Palliative care encounter   . Acute respiratory failure with hypoxia (HCC) 08/13/2015  . ESRD on hemodialysis (HCC) 08/13/2015  . Hypertensive urgency 08/13/2015  . Chronic anemia 08/13/2015  . Acute respiratory failure (HCC) 08/13/2015  . HCAP (healthcare-associated pneumonia) 08/11/2012  . Hypoxia 08/11/2012  . Hypertension 08/11/2012  . DM (diabetes mellitus), type 2 with renal complications (HCC) 08/11/2012  . C1 cervical fracture (HCC) 08/11/2012  . Abnormal MRI, thoracic spine 08/11/2012  . COPD (chronic obstructive pulmonary disease) (HCC) 08/11/2012  . CAD (coronary artery disease) 08/11/2012  . Fall 08/11/2012    Palliative Care Plan    Recommendations/Plan:  Continue pain medications as currently ordered:  Fentanyl patch 25 mcg, hydromorphone 2 mg PO PRN pain, gabapentin 300 mg qhs.  Palliative Medicine will sign off.  Please call us back if we can be of assistance.  Goals of Care and Additional Recommendations:  Limitations on Scope of Treatment: Full Scope Treatment  Code Status:  DNR   Prognosis:   < 6 months given rapid decline, recurrent aspiration, multiple medical problems, gangrenous foot which will be managed conservatively without surgery.  Discharge Planning:  Skilled Nursing Facility for rehab with Palliative care service follow-up   Thank you for allowing the Palliative Medicine Team to assist in the care of this patient.  Total time  spent:  25 min.     Greater than 50%  of this time was spent counseling and coordinating care related to the above assessment and plan.  Norvel Richards, PA-C Palliative Medicine  Please contact Palliative MedicineTeam phone at [phone] for questions and concerns between 7 am - 7 pm.   Please see AMION for individual provider pager numbers.

## 2017-10-27 NOTE — Progress Notes (Signed)
  Speech Language Pathology Treatment: Dysphagia  Patient Details Name: Nicole Cox MRN: [id number] DOB: Oct 10, 1931 Today's Date: 10/27/2017 Time: [id number] SLP Time Calculation (min) (ACUTE ONLY): 12 min  Assessment / Plan / Recommendation Clinical Impression  Pt heard asking for help as therapist reviewing chart stating a woman or child had just fallen in her room. She recognized she must be hallucinating when told no one else had been in her room. Discussed current status re: swallow function and need for thickened liquids. Requested assist to hold cup and moderate assist given to decrease cup size and reasoning after pt stated she liked to take big sips. No s/s aspiration. Previous MBS was in 09/2017 therefore recommend repeat MBS (8/15 or 8/16) prior to discharge to SNF    HPI HPI: 84 year old chronically ill female with recent sepsis- now returning 2 weeks later with dec MS, elevated WBC and lactate concerning for sepsis. Hx end-stage renal disease, hypertension, prior stroke, heart failure with preserved ejection fraction, coronary artery disease as well as COPD. CXR 10/16/17 showed "unchanged diffuse interstitial densities which may reflect mild      SLP Plan  Continue with current plan of care       Recommendations  Diet recommendations: Dysphagia 3 (mechanical soft);Nectar-thick liquid Liquids provided via: Cup;Straw Medication Administration: Whole meds with puree Supervision: Full supervision/cueing for compensatory strategies;Staff to assist with self feeding Compensations: Minimize environmental distractions;Slow rate;Small sips/bites Postural Changes and/or Swallow Maneuvers: Seated upright 90 degrees;Upright 30-60 min after meal                Oral Care Recommendations: Oral care BID Follow up Recommendations: Skilled Nursing facility SLP Visit Diagnosis: Dysphagia, oropharyngeal phase (R13.12) Plan: Continue with current plan of care                        Royce MacadamiaLitaker, Nicole Rufo Willis 10/27/2017, 11:32 AM  Breck CoonsLisa Willis Lonell FaceLitaker M.Ed [employer]-SLP Pager [phone]

## 2017-10-27 NOTE — Progress Notes (Signed)
[facility] Anticipated discharge date: 10/27/17 Family notified: Zachery DauerShakoya Hatcher, daughter Transportation by: [facility]AR  Nurse to call report to [phone].   CSW signing off.  Abigail ButtsSusan Rudean Icenhour, LCSWA  Clinical Social Worker

## 2017-10-27 NOTE — Progress Notes (Signed)
Attempt[facility] to [facility].  Hinton DyerYoko Bienvenido Proehl, RN

## 2017-10-27 NOTE — Progress Notes (Signed)
@  0436 CBG 76. Pt arousable and asymptomatic. 1 Container of applesauce fed to pt to prevent potential hypoglycemia.

## 2017-10-27 NOTE — Discharge Summary (Signed)
Physician Discharge Summary  Nicole Cox ZOX:[id number] DOB: 04-12-1931 DOA: 10/15/2017  PCP: Joana Reamer, MD  Admit date: 10/15/2017 Discharge date: 10/27/2017 Time spent: 45 minutes  Recommendations for Outpatient Follow-up:  1. SNF with palliative care follow-up, she will need hospice as she declines, wants to continue hemodialysis for now   Discharge Diagnoses:    Aspiration pneumonia   Dysphagia   Peripheral arterial disease with gangrenous changes in both feet   Sepsis (HCC)   Altered mental status   ESRD on dialysis (HCC)   Persistent atrial fibrillation (HCC)   Dry gangrene (HCC)   Oropharyngeal dysphagia   DNR (do not resuscitate)   Pain of great toe Advanced age   Discharge Condition: poor  Diet recommendation: dysphagia 1 diet with nectar thick liquids  Filed Weights   10/26/17 0900 10/26/17 1213 10/27/17 0431  Weight: 55.8 kg 54.5 kg 54.7 kg    History of present illness:  84 yo female who presented with altered mental status. She does have the significant medical history of ESRD on HD, HTN, CVA, diastolic heart failure, CAD and COPD. Recent hospitalization forAcinetobacter pneumonia. Discharged (07/20) on Imipenem that she did not complete due to declined IV access at the SNF. Head CT without atrophy, no acute changes. Chest x-ray with right lower lobe alveolar/interstitial infiltrate.Patient was admitted to hospital with working diagnosis of right lower lobe pneumonia -Noted to have peripheral vascular disease with gangrenous changes in both feet.  Evaluated by vascular surgery, felt the patient is not a candidate for any surgical intervention.  Patient will be discharged to skilled nursing facility.  Family will consider hospice if patient continues to have decline.  Speech therapy recommended dysphagia 3 with the nectar thick liquid diet, supervised staff to assist with the feeding.  Patient still should be seated upright 90 degrees while eating and a 30 to 60  minutes after meal.  Hospital Course:   Aspiration pneumonia (present on admission). -admitted with progressive weakness, shortness of breath and chest x-ray noting worsening right lower lobe infiltrate -had been on IV imipenem from prior to admission for pneumonia, this was changed to meropenem on admission,  -completed 7day course, Abx Stopped - remains at risk for aspiration due to sedation from narcotics-for ischemic pain -SLP evaluation completed, noted to have dysphagia currently on dysphagia 1 diet with nectar thick liquids -stable now, but always at risk for declining  PAD with gangrenous changes in both toes -through this hospitalization complained of severe pain in both feet right greater than left noted to have gangrenous changes in the toes, vascular surgery and palliative medicine was consulted, after much discussion she underwent aortogram 8/9-diffuse calcific atherosclerosis, occluded posterior tibials bilaterally, failed attempt at right tibial cannulation -per vascular surgeons, no endovascular options for revascularization and she is not a candidate for open vascular bypass, only option would be amputation for progressive gangrenous changes. -I called and discussed with daughter/PoA [name], explained vascular recommendations, plan at this time is to continue narcotics, palliative measures, continue dialysis as tolerated. They do not want her to have any surgeries including amputations at this time. -Discharge back to SNF with palliative care follow-up and dialysis, she is agreeable to consider hospice down the road at SNF as she deteriorates, explained that ability to tolerate HD will decrease, and overall prognosis remains very poor -continue aspirin and statin for the time being  3. Acute metabolic encephalopathy -due to above infection and narcotics -stable now, fentanyl dose and gabapentin decreased  3.  New onset atrial fibrillation. -now in sinus rhythm,  heart rate better controlled now -overall prognosis is very poor, added aspirin  4. ESRD on HDwith anemia of chronic renal disease -overall prognosis very poor given ongoing multisystem decline -renal and palliative medicine following  5. COPD with pulmonary hypertension -stable, continearfomoterol and budesonide.  6. T2DM.  -stable, continue sliding scale insulin  Ethics: this is a very frail chronically ill patient with ESRD, COPD, A. Fib, dysphagia, aspiration pneumonia, severe peripheral vascular disease with bilateral gangrene -palliative medicine consulted, now DO NOT RESUSCITATE -discharge back to SNF tomorrow with palliative care -Explained very poor overall prognosis to daughter, will need Hospice very soon  Consultants:  vascular surgery  Palliative medicine  Renal  Procedures:  aortogram 8/9   Discharge Exam: Vitals:   10/27/17 0820 10/27/17 1238  BP:  (!) 155/44  Pulse:  83  Resp:  15  Temp:  98.7 F (37.1 C)  SpO2: 100% 94%    General: alert, awake, oriented to self only Cardiovascular: S1-S2/regular rate rhythm Respiratory: clear bilaterally Abdomen bowel sounds plus soft nontender nondistended Extremities gangrenous toes  Discharge Instructions   Discharge Instructions    Diet - low sodium heart healthy   Complete by:  As directed    Discharge instructions   Complete by:  As directed    Renal Diet   Increase activity slowly   Complete by:  As directed    Increase activity slowly   Complete by:  As directed      Allergies as of 10/27/2017   No Known Allergies     Medication List    STOP taking these medications   cloNIDine 0.1 MG tablet Commonly known as:  CATAPRES   cloNIDine 0.2 mg/24hr patch Commonly known as:  CATAPRES - Dosed in mg/24 hr   diphenhydrAMINE 25 mg capsule Commonly known as:  BENADRYL   insulin detemir 100 UNIT/ML injection Commonly known as:  LEVEMIR   lisinopril 40 MG tablet Commonly  known as:  PRINIVIL,ZESTRIL   nitroGLYCERIN 0.4 MG SL tablet Commonly known as:  NITROSTAT   pravastatin 40 MG tablet Commonly known as:  PRAVACHOL     TAKE these medications   acetaminophen 325 MG tablet Commonly known as:  TYLENOL Take 650 mg by mouth every 6 (six) hours as needed.   albuterol 108 (90 Base) MCG/ACT inhaler Commonly known as:  PROVENTIL HFA;VENTOLIN HFA Inhale 2 puffs into the lungs every 6 (six) hours as needed for wheezing or shortness of breath.   aspirin EC 81 MG tablet Take 81 mg by mouth daily.   atorvastatin 20 MG tablet Commonly known as:  LIPITOR Take 1 tablet (20 mg total) by mouth daily at 6 PM.   doxercalciferol 2.5 MCG capsule Commonly known as:  HECTOROL Take 1 capsule (2.5 mcg total) by mouth Every Tuesday,Thursday,and Saturday with dialysis.   feeding supplement (NEPRO CARB STEADY) Liqd Take 237 mLs by mouth 2 (two) times daily between meals.   fentaNYL 25 MCG/HR patch Commonly known as:  DURAGESIC - dosed mcg/hr Place 1 patch (25 mcg total) onto the skin every 3 (three) days.   fluticasone furoate-vilanterol 100-25 MCG/INH Aepb Commonly known as:  BREO ELLIPTA Inhale 1 puff into the lungs daily.   gabapentin 300 MG capsule Commonly known as:  NEURONTIN Take 1 capsule (300 mg total) by mouth at bedtime. What changed:    medication strength  how much to take  when to take this   HYDROmorphone 2 MG  tablet Commonly known as:  DILAUDID Take 1 tablet (2 mg total) by mouth every 4 (four) hours as needed for moderate pain or severe pain.   INCRUSE ELLIPTA 62.5 MCG/INH Aepb Generic drug:  umeclidinium bromide Inhale 1 puff into the lungs daily.   isosorbide mononitrate 30 MG 24 hr tablet Commonly known as:  IMDUR Take 30 mg by mouth every evening.   lidocaine-prilocaine cream Commonly known as:  EMLA Apply 1 application topically every other day.   Melatonin 3 MG Tabs Take 3 mg by mouth at bedtime.   metoprolol tartrate  25 MG tablet Commonly known as:  LOPRESSOR Take 0.5 tablets (12.5 mg total) by mouth 2 (two) times daily. What changed:  when to take this   NIFEdipine 60 MG 24 hr tablet Commonly known as:  PROCARDIA XL/ADALAT-CC Take 60 mg by mouth 2 (two) times daily.   polyethylene glycol packet Commonly known as:  MIRALAX / GLYCOLAX Take 17 g by mouth daily.      No Known Allergies Contact information for after-discharge care    Destination    [facility] .   Service:  Skilled Nursing Contact information: [address] [place] [address] [phone]               The results of significant diagnostics from this hospitalization (including imaging, microbiology, ancillary and laboratory) are listed below for reference.    Significant Diagnostic Studies: Dg Chest 1 View  Result Date: 09/28/2017 CLINICAL DATA:  Hypoxia. EXAM: CHEST  1 VIEW COMPARISON:  09/26/2017. FINDINGS: Cardiomegaly with diffuse bilateral pulmonary interstitial prominence consistent with pulmonary interstitial edema and/or pneumonitis. A component these changes may be chronic. Exam unchanged from prior exam. Right base calcified pleural plaque is unchanged. No pneumothorax. IMPRESSION: Cardiomegaly. Diffuse bilateral from interstitial prominence is unchanged from prior exam as described above. Electronically Signed   By: Maisie Fus  Register   On: 09/28/2017 15:55   Ct Head Wo Contrast  Result Date: 10/15/2017 CLINICAL DATA:  Altered level of consciousness. EXAM: CT HEAD WITHOUT CONTRAST TECHNIQUE: Contiguous axial images were obtained from the base of the skull through the vertex without intravenous contrast. COMPARISON:  CT head 07/27/2017 FINDINGS: Brain: Mild atrophy. Moderate chronic microvascular ischemic change in the white matter. Negative for acute infarct, hemorrhage, mass. No change from the prior study. Empty sella incidentally noted. Vascular: Negative for hyperdense vessel  Skull: Negative Sinuses/Orbits: Interval development of right mastoid effusion and middle ear effusion. Left mastoid sinus clear. Paranasal sinuses clear. Bilateral cataract surgery. No orbital mass. Other: None IMPRESSION: Atrophy and chronic microvascular ischemia. No acute intracranial abnormality Interval development of right mastoid and middle ear effusion. Electronically Signed   By: Marlan Palau M.D.   On: 10/15/2017 11:13   Dg Chest Port 1 View  Result Date: 10/16/2017 CLINICAL DATA:  Pneumonia. EXAM: PORTABLE CHEST 1 VIEW COMPARISON:  10/15/2017 FINDINGS: The cardiac silhouette remains enlarged. Diffusely increased interstitial markings are similar to the prior study. Calcified pleural plaques are again noted in the right lower hemithorax. There is also mild pleural thickening or calcification in the right lung apex. No sizable pleural effusion or pneumothorax is identified. IMPRESSION: Unchanged diffuse interstitial densities which may reflect mild edema superimposed on chronic lung disease. Electronically Signed   By: Sebastian Ache M.D.   On: 10/16/2017 08:03   Dg Chest Portable 1 View  Result Date: 10/15/2017 CLINICAL DATA:  Shortness of breath. EXAM: PORTABLE CHEST 1 VIEW COMPARISON:  10/01/2017  FINDINGS: The heart is enlarged. There is tortuosity and calcification of the thoracic aorta. Extensive interstitial changes appear relatively stable and could reflect interstitial disease or interstitial edema. No focal infiltrates. Dense calcifications at the right lung base likely pleural calcifications related to prior hemothorax or empyema. No definite pleural effusions. Chest CT may be helpful for further evaluation. IMPRESSION: Cardiac enlargement and probable interstitial edema superimposed on chronic lung disease. CT may be helpful for further evaluation. Electronically Signed   By: Rudie Meyer M.D.   On: 10/15/2017 09:42   Dg Chest Port 1 View  Result Date: 10/01/2017 CLINICAL DATA:   Pneumonia. EXAM: PORTABLE CHEST 1 VIEW COMPARISON:  09/28/2017. FINDINGS: Stable cardiomegaly. Stable diffuse bilateral interstitial prominence consistent with interstitial edema and/or pneumonitis. A component these changes may be chronic. Exam is unchanged from prior exam. Stable right base calcified pleural plaque is again noted. No pneumothorax. Surgical clips upper abdomen. IMPRESSION: 1.  Stable cardiomegaly. 2. Stable diffuse bilateral interstitial edema and/or pneumonitis. A component interstitial changes may be chronic. Stable right base calcified pleural plaque. Chest is unchanged from prior exam. Electronically Signed   By: Maisie Fus  Register   On: 10/01/2017 07:57   Dg Swallowing Func-speech Pathology  Result Date: 09/30/2017 Objective Swallowing Evaluation: Type of Study: MBS-Modified Barium Swallow Study  Patient Details Name: Kateena Degroote MRN: [id number] Date of Birth: [birth year] Today's Date: 09/30/2017 Time: SLP Start Time (ACUTE ONLY): 1660 -SLP Stop Time (ACUTE ONLY): 0958 SLP Time Calculation (min) (ACUTE ONLY): 19 min Past Medical History: Past Medical History: Diagnosis Date . CHF (congestive heart failure) (HCC)  . COPD (chronic obstructive pulmonary disease) (HCC)  . Coronary artery disease  . Diabetes mellitus without complication (HCC)  . Heart disease  . Hypertension  . Renal disorder  . Stroke [facility])  Past Surgical History: Past Surgical History: Procedure Laterality Date . ABDOMINAL HYSTERECTOMY   . CHOLECYSTECTOMY   . TEE WITHOUT CARDIOVERSION N/A 08/02/2017  Procedure: TRANSESOPHAGEAL ECHOCARDIOGRAM (TEE);  Surgeon: Lars Masson, MD;  [facility] ENDOSCOPY;  Service: Cardiovascular;  Laterality: N/A; . TONSILLECTOMY   HPI: Pt is an 83 y.o. female who presents from SNF with AMS, SOB after dialysis. Outpatient CXR showed PNA. Pt denies any h/o dysphagia but is on a dysphagia diet per MD note and although she believes she has had PNA before she is not sure when this was. PMH: CHF,  CAD, COPD, diabetes, HTN, ESRD, CVA  Subjective: pt alert, hungry/thirsty Assessment / Plan / Recommendation CHL IP CLINICAL IMPRESSIONS 09/30/2017 Clinical Impression Pt has a mild oropharyngeal dysphagia also impacted by cognitive status with impulsive rate of intake and inconsistent command following. Orally she has slow mastication due to missing dentition, delayed oral transit with lingual residue noted with purees, and premature spillage of liquids. Oral containment is not facilitate by a chin tuck, and she does not slow her rate or take single sips despite cueing from SLP. Thin liquids and nectar thick liquids by cup reach her pyriform sinuses before the swallow and are penetrated to the vocal folds without attempts to clear. Mod cues from SLP for volitional cough are not fully effective at clearing the laryngeal vestibule. While this is not necessarily abnormal for her age, her compromised respiratory system and decreased functional reserve from acute illness would make her more susceptible to negative side effects from aspiration at this time. Straw sips of nectar thick liquids spill only to the vallecula, allowing for better airway protection during the swallow. Recommend starting Dys 2  diet and nectar thick liquids via straw. Will f/u for tolerance and potential to advance as she recovers from her acute hospitalization. SLP Visit Diagnosis Dysphagia, oropharyngeal phase (R13.12) Attention and concentration deficit following -- Frontal lobe and executive function deficit following -- Impact on safety and function Moderate aspiration risk   CHL IP TREATMENT RECOMMENDATION 09/30/2017 Treatment Recommendations Therapy as outlined in treatment plan below   Prognosis 09/30/2017 Prognosis for Safe Diet Advancement Good Barriers to Reach Goals Cognitive deficits Barriers/Prognosis Comment -- CHL IP DIET RECOMMENDATION 09/30/2017 SLP Diet Recommendations Dysphagia 2 (Fine chop) solids;Nectar thick liquid Liquid  Administration via Straw Medication Administration Whole meds with puree Compensations Minimize environmental distractions;Slow rate;Small sips/bites Postural Changes Seated upright at 90 degrees;Remain semi-upright after after feeds/meals (Comment)   CHL IP OTHER RECOMMENDATIONS 09/30/2017 Recommended Consults -- Oral Care Recommendations Oral care BID Other Recommendations --   CHL IP FOLLOW UP RECOMMENDATIONS 09/30/2017 Follow up Recommendations Skilled Nursing facility   [facility] IP FREQUENCY AND DURATION 09/30/2017 Speech Therapy Frequency (ACUTE ONLY) min 2x/week Treatment Duration 2 weeks      CHL IP ORAL PHASE 09/30/2017 Oral Phase Impaired Oral - Pudding Teaspoon -- Oral - Pudding Cup -- Oral - Honey Teaspoon -- Oral - Honey Cup -- Oral - Nectar Teaspoon -- Oral - Nectar Cup Premature spillage;Decreased bolus cohesion Oral - Nectar Straw Decreased bolus cohesion;Premature spillage Oral - Thin Teaspoon -- Oral - Thin Cup Decreased bolus cohesion;Premature spillage Oral - Thin Straw Decreased bolus cohesion;Premature spillage Oral - Puree Lingual/palatal residue Oral - Mech Soft Impaired mastication;Delayed oral transit Oral - Regular -- Oral - Multi-Consistency -- Oral - Pill -- Oral Phase - Comment --  CHL IP PHARYNGEAL PHASE 09/30/2017 Pharyngeal Phase Impaired Pharyngeal- Pudding Teaspoon -- Pharyngeal -- Pharyngeal- Pudding Cup -- Pharyngeal -- Pharyngeal- Honey Teaspoon -- Pharyngeal -- Pharyngeal- Honey Cup -- Pharyngeal -- Pharyngeal- Nectar Teaspoon -- Pharyngeal -- Pharyngeal- Nectar Cup Delayed swallow initiation-pyriform sinuses;Penetration/Aspiration before swallow Pharyngeal Material enters airway, CONTACTS cords and not ejected out Pharyngeal- Nectar Straw Delayed swallow initiation-vallecula Pharyngeal -- Pharyngeal- Thin Teaspoon -- Pharyngeal -- Pharyngeal- Thin Cup Delayed swallow initiation-pyriform sinuses;Penetration/Aspiration before swallow;Compensatory strategies attempted (with notebox)  Pharyngeal Material enters airway, CONTACTS cords and not ejected out Pharyngeal- Thin Straw Delayed swallow initiation-pyriform sinuses;Penetration/Aspiration before swallow Pharyngeal Material enters airway, passes BELOW cords without attempt by patient to eject out (silent aspiration) Pharyngeal- Puree WFL Pharyngeal -- Pharyngeal- Mechanical Soft WFL Pharyngeal -- Pharyngeal- Regular -- Pharyngeal -- Pharyngeal- Multi-consistency -- Pharyngeal -- Pharyngeal- Pill -- Pharyngeal -- Pharyngeal Comment --  CHL IP CERVICAL ESOPHAGEAL PHASE 09/30/2017 Cervical Esophageal Phase WFL Pudding Teaspoon -- Pudding Cup -- Honey Teaspoon -- Honey Cup -- Nectar Teaspoon -- Nectar Cup -- Nectar Straw -- Thin Teaspoon -- Thin Cup -- Thin Straw -- Puree -- Mechanical Soft -- Regular -- Multi-consistency -- Pill -- Cervical Esophageal Comment -- No flowsheet data found. Maxcine Ham 09/30/2017, 11:01 AM  Maxcine Ham, M.A. CCC-SLP [phone]              Microbiology: No results found for this or any previous visit (from the past 240 hour(s)).   Labs: Basic Metabolic Panel: Recent Labs  Lab 10/23/17 0325 10/23/17 1037 10/24/17 0045 10/26/17 0835 10/26/17 0847  NA 134* 136 131* 136 137  K 5.0 5.5* 5.0 5.0 5.1  CL 94* 95* 91* 97* 98  CO2 28 28 26 28 27   GLUCOSE 66* 85 91 70 72  BUN 13 15 19 21 22   CREATININE  4.21* 4.69* 5.77* 6.22* 6.39*  CALCIUM 8.7* 9.2 8.8* 8.8* 8.9  PHOS  --  5.8* 6.5* 5.9* 6.2*   Liver Function Tests: Recent Labs  Lab 10/23/17 1037 10/24/17 0045 10/26/17 0835 10/26/17 0847  ALBUMIN 2.4* 2.2* 2.1* 2.2*   No results for input(s): LIPASE, AMYLASE in the last 168 hours. No results for input(s): AMMONIA in the last 168 hours. CBC: Recent Labs  Lab 10/22/17 0449 10/23/17 0325 10/23/17 1037 10/24/17 0045 10/26/17 0847  WBC 13.0* 11.5* 12.2* 11.6* 11.3*  HGB 9.5* 8.9* 10.2* 8.6* 9.1*  HCT 32.4* 30.2* 34.7* 29.3* 30.7*  MCV 89.3 88.0 88.3 87.5 89.0  PLT 328 306  315 321 317   Cardiac Enzymes: No results for input(s): CKTOTAL, CKMB, CKMBINDEX, TROPONINI in the last 168 hours. BNP: BNP (last 3 results) Recent Labs    06/07/17 1000 09/25/17 2145  BNP 1,685.3* 2,183.6*    ProBNP (last 3 results) No results for input(s): PROBNP in the last 8760 hours.  CBG: Recent Labs  Lab 10/26/17 2025 10/26/17 2313 10/27/17 0436 10/27/17 0742 10/27/17 1143  GLUCAP 91 105* 76 93 124*       Signed:  Dimitrios Balestrieri MD.  [facility] 10/27/2017, 1:43 PM

## 2017-10-27 NOTE — Clinical Social Work Placement (Signed)
   CLINICAL SOCIAL WORK PLACEMENT  NOTE  Date:  10/27/2017  Patient Details  Name: Nicole Cox MRN: [id number] Date of Birth: 08/15/1931  Clinical Social Work is seeking post-discharge placement for this patient at the Skilled  Nursing Facility level of care (*CSW will initial, date and re-position this form in  chart as items are completed):  Yes   Patient/family provided with [place] Clinical Social Work Department's list of facilities offering this level of care within the geographic area requested by the patient (or if unable, by the patient's family).  Yes   Patient/family informed of their freedom to choose among providers that offer the needed level of care, that participate in Medicare, Medicaid or managed care program needed by the patient, have an available bed and are willing to accept the patient.  Yes   Patient/family informed of [facility]'s ownership interest in [facility] and [facility], as well as of the fact that they are under no obligation to receive care at these facilities.  PASRR submitted to EDS on       PASRR number received on       Existing PASRR number confirmed on 10/20/17     FL2 transmitted to all facilities in geographic area requested by pt/family on 10/20/17     FL2 transmitted to all facilities within larger geographic area on       Patient informed that his/her managed care company has contracts with or will negotiate with certain facilities, including the following:  [facility], [place]     Yes   Patient/family informed of bed offers received.  [facility], [facility]     Physician recommends and patient chooses bed at      [facility] on 10/27/17.  Patient to be transferred to facility by [facility]     Patient family notified on 10/27/17 of transfer.  Name of family member notified:  Nicole DauerShakoya Cox, daughter     PHYSICIAN        Additional Comment:    _______________________________________________ Abigail ButtsSusan Jadwiga Faidley, LCSW 10/27/2017, 11:15 AM

## 2017-11-02 ENCOUNTER — Encounter (HOSPITAL_COMMUNITY): Payer: Self-pay | Admitting: Emergency Medicine

## 2017-11-02 ENCOUNTER — Other Ambulatory Visit: Payer: Self-pay

## 2017-11-02 ENCOUNTER — Observation Stay (HOSPITAL_COMMUNITY)
Admission: EM | Admit: 2017-11-02 | Discharge: 2017-11-05 | Disposition: A | Discharge status: Skilled Nursing Facility | Payer: Medicare HMO | Attending: Internal Medicine | Admitting: Internal Medicine

## 2017-11-02 ENCOUNTER — Observation Stay (HOSPITAL_COMMUNITY): Payer: Medicare HMO

## 2017-11-02 DIAGNOSIS — K922 Gastrointestinal hemorrhage, unspecified: Secondary | ICD-10-CM | POA: Diagnosis present

## 2017-11-02 DIAGNOSIS — K319 Disease of stomach and duodenum, unspecified: Secondary | ICD-10-CM | POA: Diagnosis not present

## 2017-11-02 DIAGNOSIS — K259 Gastric ulcer, unspecified as acute or chronic, without hemorrhage or perforation: Secondary | ICD-10-CM | POA: Diagnosis not present

## 2017-11-02 DIAGNOSIS — E785 Hyperlipidemia, unspecified: Secondary | ICD-10-CM | POA: Diagnosis not present

## 2017-11-02 DIAGNOSIS — I10 Essential (primary) hypertension: Secondary | ICD-10-CM | POA: Diagnosis present

## 2017-11-02 DIAGNOSIS — I96 Gangrene, not elsewhere classified: Secondary | ICD-10-CM | POA: Diagnosis present

## 2017-11-02 DIAGNOSIS — J449 Chronic obstructive pulmonary disease, unspecified: Secondary | ICD-10-CM | POA: Diagnosis present

## 2017-11-02 DIAGNOSIS — D631 Anemia in chronic kidney disease: Secondary | ICD-10-CM | POA: Insufficient documentation

## 2017-11-02 DIAGNOSIS — Z8673 Personal history of transient ischemic attack (TIA), and cerebral infarction without residual deficits: Secondary | ICD-10-CM | POA: Insufficient documentation

## 2017-11-02 DIAGNOSIS — E1152 Type 2 diabetes mellitus with diabetic peripheral angiopathy with gangrene: Secondary | ICD-10-CM | POA: Diagnosis not present

## 2017-11-02 DIAGNOSIS — I739 Peripheral vascular disease, unspecified: Secondary | ICD-10-CM | POA: Diagnosis present

## 2017-11-02 DIAGNOSIS — D72829 Elevated white blood cell count, unspecified: Secondary | ICD-10-CM | POA: Diagnosis present

## 2017-11-02 DIAGNOSIS — G9341 Metabolic encephalopathy: Secondary | ICD-10-CM | POA: Diagnosis not present

## 2017-11-02 DIAGNOSIS — I2510 Atherosclerotic heart disease of native coronary artery without angina pectoris: Secondary | ICD-10-CM | POA: Diagnosis present

## 2017-11-02 DIAGNOSIS — N186 End stage renal disease: Secondary | ICD-10-CM | POA: Diagnosis not present

## 2017-11-02 DIAGNOSIS — D62 Acute posthemorrhagic anemia: Secondary | ICD-10-CM | POA: Insufficient documentation

## 2017-11-02 DIAGNOSIS — K253 Acute gastric ulcer without hemorrhage or perforation: Secondary | ICD-10-CM

## 2017-11-02 DIAGNOSIS — Z87891 Personal history of nicotine dependence: Secondary | ICD-10-CM | POA: Diagnosis not present

## 2017-11-02 DIAGNOSIS — Z992 Dependence on renal dialysis: Secondary | ICD-10-CM | POA: Diagnosis not present

## 2017-11-02 DIAGNOSIS — Z7982 Long term (current) use of aspirin: Secondary | ICD-10-CM | POA: Insufficient documentation

## 2017-11-02 DIAGNOSIS — K228 Other specified diseases of esophagus: Secondary | ICD-10-CM | POA: Insufficient documentation

## 2017-11-02 DIAGNOSIS — Z7951 Long term (current) use of inhaled steroids: Secondary | ICD-10-CM | POA: Diagnosis not present

## 2017-11-02 DIAGNOSIS — N2581 Secondary hyperparathyroidism of renal origin: Secondary | ICD-10-CM | POA: Insufficient documentation

## 2017-11-02 DIAGNOSIS — E1122 Type 2 diabetes mellitus with diabetic chronic kidney disease: Secondary | ICD-10-CM | POA: Insufficient documentation

## 2017-11-02 DIAGNOSIS — Z66 Do not resuscitate: Secondary | ICD-10-CM | POA: Diagnosis not present

## 2017-11-02 DIAGNOSIS — I132 Hypertensive heart and chronic kidney disease with heart failure and with stage 5 chronic kidney disease, or end stage renal disease: Secondary | ICD-10-CM | POA: Diagnosis not present

## 2017-11-02 DIAGNOSIS — I482 Chronic atrial fibrillation, unspecified: Secondary | ICD-10-CM | POA: Diagnosis present

## 2017-11-02 DIAGNOSIS — I509 Heart failure, unspecified: Secondary | ICD-10-CM | POA: Insufficient documentation

## 2017-11-02 DIAGNOSIS — E1129 Type 2 diabetes mellitus with other diabetic kidney complication: Secondary | ICD-10-CM | POA: Diagnosis present

## 2017-11-02 DIAGNOSIS — K920 Hematemesis: Secondary | ICD-10-CM | POA: Diagnosis not present

## 2017-11-02 LAB — COMPREHENSIVE METABOLIC PANEL
ALT: 27 U/L (ref 0–44)
ANION GAP: 11 (ref 5–15)
AST: 51 U/L — ABNORMAL HIGH (ref 15–41)
Albumin: 2.5 g/dL — ABNORMAL LOW (ref 3.5–5.0)
Alkaline Phosphatase: 118 U/L (ref 38–126)
BUN: 14 mg/dL (ref 8–23)
CHLORIDE: 100 mmol/L (ref 98–111)
CO2: 27 mmol/L (ref 22–32)
CREATININE: 3.86 mg/dL — AB (ref 0.44–1.00)
Calcium: 8.9 mg/dL (ref 8.9–10.3)
GFR calc Af Amer: 11 mL/min — ABNORMAL LOW (ref >60)
GFR calc non Af Amer: 10 mL/min — ABNORMAL LOW (ref >60)
Glucose, Bld: 119 mg/dL — ABNORMAL HIGH (ref 70–99)
POTASSIUM: 5 mmol/L (ref 3.5–5.1)
SODIUM: 138 mmol/L (ref 135–145)
Total Bilirubin: 0.4 mg/dL (ref 0.3–1.2)
Total Protein: 7.8 g/dL (ref 6.5–8.1)

## 2017-11-02 LAB — CBC WITH DIFFERENTIAL/PLATELET
ABS IMMATURE GRANULOCYTES: 0.1 10*3/uL (ref 0.0–0.1)
BASOS ABS: 0 10*3/uL (ref 0.0–0.1)
BASOS PCT: 0 %
EOS ABS: 0.4 10*3/uL (ref 0.0–0.7)
Eosinophils Relative: 3 %
HCT: 36.1 % (ref 36.0–46.0)
Hemoglobin: 10.4 g/dL — ABNORMAL LOW (ref 12.0–15.0)
Immature Granulocytes: 1 %
Lymphocytes Relative: 10 %
Lymphs Abs: 1.3 10*3/uL (ref 0.7–4.0)
MCH: 26.2 pg (ref 26.0–34.0)
MCHC: 28.8 g/dL — ABNORMAL LOW (ref 30.0–36.0)
MCV: 90.9 fL (ref 78.0–100.0)
Monocytes Absolute: 1.4 10*3/uL — ABNORMAL HIGH (ref 0.1–1.0)
Monocytes Relative: 10 %
NEUTROS ABS: 10.6 10*3/uL — AB (ref 1.7–7.7)
NEUTROS PCT: 76 %
PLATELETS: 315 10*3/uL (ref 150–400)
RBC: 3.97 MIL/uL (ref 3.87–5.11)
RDW: 20.3 % — AB (ref 11.5–15.5)
WBC: 13.8 10*3/uL — ABNORMAL HIGH (ref 4.0–10.5)

## 2017-11-02 LAB — TYPE AND SCREEN
ABO/RH(D): O POS
ANTIBODY SCREEN: NEGATIVE

## 2017-11-02 MED ORDER — SODIUM CHLORIDE 0.9 % IV SOLN
80.0000 mg | Freq: Once | INTRAVENOUS | Status: AC
  Administered 2017-11-02: 80 mg via INTRAVENOUS
  Filled 2017-11-02: qty 80

## 2017-11-02 MED ORDER — ACETAMINOPHEN 650 MG RE SUPP: 650.0000 mg | Freq: Four times a day (QID) | RECTAL | Status: DC | PRN

## 2017-11-02 MED ORDER — SODIUM CHLORIDE 0.9 % IV SOLN
INTRAVENOUS | Status: DC
  Administered 2017-11-02 (×2): via INTRAVENOUS

## 2017-11-02 MED ORDER — HYDRALAZINE HCL 20 MG/ML IJ SOLN: 5.0000 mg | INTRAMUSCULAR | Status: DC | PRN

## 2017-11-02 MED ORDER — GABAPENTIN 300 MG PO CAPS
300.0000 mg | ORAL_CAPSULE | Freq: Every day | ORAL | Status: DC
  Administered 2017-11-03: 300 mg via ORAL
  Filled 2017-11-02: qty 1

## 2017-11-02 MED ORDER — DOXERCALCIFEROL 2.5 MCG PO CAPS
2.5000 ug | ORAL_CAPSULE | ORAL | Status: DC
  Filled 2017-11-02: qty 1

## 2017-11-02 MED ORDER — PANTOPRAZOLE SODIUM 40 MG IV SOLR: 40.0000 mg | Freq: Two times a day (BID) | INTRAVENOUS | Status: DC

## 2017-11-02 MED ORDER — HYDROMORPHONE HCL 2 MG PO TABS
1.0000 mg | ORAL_TABLET | ORAL | Status: DC | PRN
  Filled 2017-11-02: qty 1

## 2017-11-02 MED ORDER — ONDANSETRON HCL 4 MG/2ML IJ SOLN: 4.0000 mg | Freq: Four times a day (QID) | INTRAMUSCULAR | Status: DC | PRN

## 2017-11-02 MED ORDER — POLYETHYLENE GLYCOL 3350 17 G PO PACK: 17.0000 g | PACK | Freq: Every day | ORAL | Status: DC | PRN

## 2017-11-02 MED ORDER — NEPRO/CARBSTEADY PO LIQD
237.0000 mL | Freq: Two times a day (BID) | ORAL | Status: DC
  Administered 2017-11-04 – 2017-11-05 (×2): 237 mL via ORAL
  Filled 2017-11-02 (×6): qty 237

## 2017-11-02 MED ORDER — ISOSORBIDE MONONITRATE ER 30 MG PO TB24
30.0000 mg | ORAL_TABLET | Freq: Every evening | ORAL | Status: DC
  Administered 2017-11-03 (×2): 30 mg via ORAL
  Filled 2017-11-02 (×2): qty 1

## 2017-11-02 MED ORDER — SODIUM CHLORIDE 0.9 % IV SOLN
8.0000 mg/h | INTRAVENOUS | Status: DC
  Administered 2017-11-02: 8 mg/h via INTRAVENOUS
  Administered 2017-11-03: 6.4 mg/h via INTRAVENOUS
  Administered 2017-11-03: 8 mg/h via INTRAVENOUS
  Filled 2017-11-02 (×2): qty 80
  Filled 2017-11-02: qty 40
  Filled 2017-11-02 (×6): qty 80

## 2017-11-02 MED ORDER — ACETAMINOPHEN 325 MG PO TABS
650.0000 mg | ORAL_TABLET | Freq: Four times a day (QID) | ORAL | Status: DC | PRN
  Administered 2017-11-03 – 2017-11-04 (×5): 650 mg via ORAL
  Filled 2017-11-02 (×5): qty 2

## 2017-11-02 MED ORDER — ATORVASTATIN CALCIUM 20 MG PO TABS
20.0000 mg | ORAL_TABLET | Freq: Every evening | ORAL | Status: DC
  Administered 2017-11-03 (×2): 20 mg via ORAL
  Filled 2017-11-02 (×2): qty 1

## 2017-11-02 MED ORDER — NIFEDIPINE ER OSMOTIC RELEASE 60 MG PO TB24
60.0000 mg | ORAL_TABLET | Freq: Two times a day (BID) | ORAL | Status: DC
  Administered 2017-11-03 – 2017-11-05 (×4): 60 mg via ORAL
  Filled 2017-11-02 (×7): qty 1

## 2017-11-02 MED ORDER — ALBUTEROL SULFATE (2.5 MG/3ML) 0.083% IN NEBU: 2.5000 mg | INHALATION_SOLUTION | RESPIRATORY_TRACT | Status: DC | PRN

## 2017-11-02 MED ORDER — ZOLPIDEM TARTRATE 5 MG PO TABS
5.0000 mg | ORAL_TABLET | Freq: Every evening | ORAL | Status: DC | PRN
  Administered 2017-11-03: 5 mg via ORAL
  Filled 2017-11-02: qty 1

## 2017-11-02 MED ORDER — METOPROLOL TARTRATE 12.5 MG HALF TABLET
12.5000 mg | ORAL_TABLET | Freq: Two times a day (BID) | ORAL | Status: DC
  Administered 2017-11-03 – 2017-11-05 (×4): 12.5 mg via ORAL
  Filled 2017-11-02 (×6): qty 1

## 2017-11-02 MED ORDER — FLUTICASONE FUROATE-VILANTEROL 100-25 MCG/INH IN AEPB
1.0000 | INHALATION_SPRAY | Freq: Every day | RESPIRATORY_TRACT | Status: DC
  Filled 2017-11-02: qty 28

## 2017-11-02 MED ORDER — LIDOCAINE-PRILOCAINE 2.5-2.5 % EX CREA
1.0000 "application " | TOPICAL_CREAM | Freq: Every day | CUTANEOUS | Status: DC
  Administered 2017-11-03 – 2017-11-05 (×2): 1 via TOPICAL
  Filled 2017-11-02: qty 5

## 2017-11-02 MED ORDER — MELATONIN 3 MG PO TABS
3.0000 mg | ORAL_TABLET | Freq: Every day | ORAL | Status: DC
  Administered 2017-11-03 – 2017-11-04 (×3): 3 mg via ORAL
  Filled 2017-11-02 (×4): qty 1

## 2017-11-02 MED ORDER — ONDANSETRON HCL 4 MG PO TABS: 4.0000 mg | ORAL_TABLET | Freq: Four times a day (QID) | ORAL | Status: DC | PRN

## 2017-11-02 MED ORDER — UMECLIDINIUM BROMIDE 62.5 MCG/INH IN AEPB
1.0000 | INHALATION_SPRAY | Freq: Every day | RESPIRATORY_TRACT | Status: DC
  Filled 2017-11-02: qty 7

## 2017-11-02 NOTE — H&P (Signed)
History and Physical    Nicole Cox OZH:[id number] DOB: 02/14/1932 DOA: 11/02/2017  Referring MD/NP/PA:   PCP: Joana Reamer, MD   Patient coming from:  The patient is coming from SNF.  At baseline, pt is dependent for most of ADL.  Chief Complaint: Hematemesis  HPI: Nicole Cox is a 84 y.o. female with medical history significant of hypertension, hyperlipidemia, diet-controlled diabetes, COPD, stroke, CHF, CAD, atrial fibrillation not on anticoagulants, ESRD-HD (TTS), PAD with bilateral gangrene feet, dysphagia, who presents with hematemesis.  Per report, pt had nausea and vomited approximately 60 cc of dark material while she was receiving dialysis today. Apparently Hemoccult has been sent for evaluation of that, but no result reported. Currently, pt does not have nausea, vomiting, abdominal pain or diarrhea.  Patient denies chest pain, shortness breath.  No cough, fever or chills.  Denies symptoms of UTI. She complains of foot pain bilaterally. Per her daughter, pt's mental status is at her baseline. Of note, patient was recently hospitalized from 8/2-8/14 due to aspiration pneumonia, acute metabolic encephalopathy and PAD with gangrenous changes in both toes  ED Course: pt was found to have WBC 13.8, hemoglobin 10.4 which was 9.1 on 10/26/2017, potassium 5.0, bicarbonate 27, creatinine 3.86, BUN 14, temperature 99.3, tachycardia, no tachypnea, oxygen saturation 60% on room air, which improved to 100% on 2 L nasal cannula oxygen.  Patient is placed on telemetry bed of observation.  Review of Systems:   General: no fevers, chills, no body weight gain, has fatigue HEENT: no blurry vision, hearing changes or sore throat Respiratory: no dyspnea, coughing, wheezing CV: no chest pain, no palpitations GI: has nausea, hematemesis and abdominal pain, diarrhea, constipation GU: no dysuria, burning on urination, increased urinary frequency, hematuria  Ext: no leg edema Neuro: no unilateral  weakness, numbness, or tingling, no vision change or hearing loss Skin: has gangrenous changes in both toes MSK: No muscle spasm, no deformity, no limitation of range of movement in spin Heme: No easy bruising.  Travel history: No recent long distant travel.  Allergy: No Known Allergies  Past Medical History:  Diagnosis Date  . CHF (congestive heart failure) (HCC)   . COPD (chronic obstructive pulmonary disease) (HCC)   . Coronary artery disease   . Diabetes mellitus without complication (HCC)   . Heart disease   . Hypertension   . Renal disorder   . Stroke [facility])     Past Surgical History:  Procedure Laterality Date  . ABDOMINAL HYSTERECTOMY    . CHOLECYSTECTOMY    . LOWER EXTREMITY ANGIOGRAPHY Bilateral 10/22/2017   Procedure: LOWER EXTREMITY ANGIOGRAPHY Bilateral Runoff;  Surgeon: Fransisco Hertz, MD;  Location: [facility] INVASIVE CV LAB;  Service: Cardiovascular;  Laterality: Bilateral;  . PERIPHERAL VASCULAR INTERVENTION Right 10/22/2017   Procedure: PERIPHERAL VASCULAR INTERVENTION;  Surgeon: Fransisco Hertz, MD;  Location: [facility] INVASIVE CV LAB;  Service: Cardiovascular;  Laterality: Right;  limited study unable to cross lesion  . TEE WITHOUT CARDIOVERSION N/A 08/02/2017   Procedure: TRANSESOPHAGEAL ECHOCARDIOGRAM (TEE);  Surgeon: Lars Masson, MD;  Location: [facility] ENDOSCOPY;  Service: Cardiovascular;  Laterality: N/A;  . TONSILLECTOMY      Social History:  reports that she has quit smoking. She has never used smokeless tobacco. She reports that she does not drink alcohol or use drugs.  Family History:  Family History  Problem Relation Age of Onset  . Heart disease Mother   . Hypertension Mother   . Heart disease Father   . Hypertension  Father   . Heart disease Sister   . Diabetes Sister   . Stroke Sister   . Kidney disease Sister   . Hypertension Sister   . Heart disease Sister   . Kidney disease Sister   . Hypertension Sister   . Kidney disease Daughter   . Hypertension  Daughter      Prior to Admission medications   Medication Sig Start Date End Date Taking? Authorizing Provider  acetaminophen (TYLENOL) 325 MG tablet Take 650 mg by mouth every 6 (six) hours as needed.    [provider]  albuterol (PROVENTIL HFA;VENTOLIN HFA) 108 (90 Base) MCG/ACT inhaler Inhale 2 puffs into the lungs every 6 (six) hours as needed for wheezing or shortness of breath. 08/17/15   Edsel Petrin, DO  aspirin EC 81 MG tablet Take 81 mg by mouth daily.    [provider]  atorvastatin (LIPITOR) 20 MG tablet Take 1 tablet (20 mg total) by mouth daily at 6 PM. 10/25/17   Zannie Cove, MD  doxercalciferol (HECTOROL) 2.5 MCG capsule Take 1 capsule (2.5 mcg total) by mouth Every Tuesday,Thursday,and Saturday with dialysis. 08/03/17   Osvaldo Shipper, MD  fentaNYL (DURAGESIC - DOSED MCG/HR) 25 MCG/HR patch Place 1 patch (25 mcg total) onto the skin every 3 (three) days. 10/27/17   Zannie Cove, MD  fluticasone furoate-vilanterol (BREO ELLIPTA) 100-25 MCG/INH AEPB Inhale 1 puff into the lungs daily. 11/10/15   [provider]  gabapentin (NEURONTIN) 300 MG capsule Take 1 capsule (300 mg total) by mouth at bedtime. 10/25/17   Zannie Cove, MD  HYDROmorphone (DILAUDID) 2 MG tablet Take 1 tablet (2 mg total) by mouth every 4 (four) hours as needed for moderate pain or severe pain. 10/25/17   Zannie Cove, MD  isosorbide mononitrate (IMDUR) 30 MG 24 hr tablet Take 30 mg by mouth every evening. 03/21/17   [provider]  lidocaine-prilocaine (EMLA) cream Apply 1 application topically every other day. 10/21/15   [provider]  Melatonin 3 MG TABS Take 3 mg by mouth at bedtime.    [provider]  metoprolol tartrate (LOPRESSOR) 25 MG tablet Take 0.5 tablets (12.5 mg total) by mouth 2 (two) times daily. Patient taking differently: Take 12.5 mg by mouth daily.  06/10/17   Regalado, Belkys A, MD  NIFEdipine (PROCARDIA XL/ADALAT-CC) 60 MG 24 hr  tablet Take 60 mg by mouth 2 (two) times daily. 12/03/15   [provider]  Nutritional Supplements (FEEDING SUPPLEMENT, NEPRO CARB STEADY,) LIQD Take 237 mLs by mouth 2 (two) times daily between meals. Patient not taking: Reported on 10/15/2017 06/09/17   Regalado, Jon Billings A, MD  polyethylene glycol (MIRALAX / GLYCOLAX) packet Take 17 g by mouth daily. 10/25/17   Zannie Cove, MD  umeclidinium bromide (INCRUSE ELLIPTA) 62.5 MCG/INH AEPB Inhale 1 puff into the lungs daily.    [provider]    Physical Exam: Vitals:   11/02/17 1915 11/02/17 1930 11/02/17 2145 11/02/17 2200  BP: (!) 128/49 (!) 121/101 (!) 134/47 (!) 132/50  Pulse: 75 72 74 74  Resp:    16  Temp:      TempSrc:      SpO2: 99% 100% 100% 100%   General: Not in acute distress HEENT:       Eyes: PERRL, EOMI, no scleral icterus.       ENT: No discharge from the ears and nose, no pharynx injection, no tonsillar enlargement.        Neck: No  JVD, no bruit, no mass felt. Heme: No neck lymph node enlargement. Cardiac: S1/S2, RRR, No murmurs, No gallops or rubs. Respiratory: No rales, wheezing, rhonchi or rubs. GI: Soft, nondistended, nontender, no rebound pain, no organomegaly, BS present. GU: No hematuria Ext: No pitting leg edema bilaterally.  Musculoskeletal: No joint deformities, No joint redness or warmth, no limitation of ROM in spin. Skin: has gangrenous changes in both toes Neuro: Alert, knows her own name and place, but not oriented to time. cranial nerves II-XII grossly intact, moves all extremities normally. Psych: Patient is not psychotic, no suicidal or hemocidal ideation.  Labs on Admission: I have personally reviewed following labs and imaging studies  CBC: Recent Labs  Lab 11/02/17 1812  WBC 13.8*  NEUTROABS 10.6*  HGB 10.4*  HCT 36.1  MCV 90.9  PLT 315   Basic Metabolic Panel: Recent Labs  Lab 11/02/17 1812  NA 138  K 5.0  CL 100  CO2 27  GLUCOSE 119*  BUN 14  CREATININE  3.86*  CALCIUM 8.9   GFR: Estimated Creatinine Clearance: 8.7 mL/min (A) (by C-G formula based on SCr of 3.86 mg/dL (H)). Liver Function Tests: Recent Labs  Lab 11/02/17 1812  AST 51*  ALT 27  ALKPHOS 118  BILITOT 0.4  PROT 7.8  ALBUMIN 2.5*   No results for input(s): LIPASE, AMYLASE in the last 168 hours. No results for input(s): AMMONIA in the last 168 hours. Coagulation Profile: No results for input(s): INR, PROTIME in the last 168 hours. Cardiac Enzymes: No results for input(s): CKTOTAL, CKMB, CKMBINDEX, TROPONINI in the last 168 hours. BNP (last 3 results) No results for input(s): PROBNP in the last 8760 hours. HbA1C: No results for input(s): HGBA1C in the last 72 hours. CBG: Recent Labs  Lab 10/26/17 2313 10/27/17 0436 10/27/17 0742 10/27/17 1143  GLUCAP 105* 76 93 124*   Lipid Profile: No results for input(s): CHOL, HDL, LDLCALC, TRIG, CHOLHDL, LDLDIRECT in the last 72 hours. Thyroid Function Tests: No results for input(s): TSH, T4TOTAL, FREET4, T3FREE, THYROIDAB in the last 72 hours. Anemia Panel: No results for input(s): VITAMINB12, FOLATE, FERRITIN, TIBC, IRON, RETICCTPCT in the last 72 hours. Urine analysis:    Component Value Date/Time   COLORURINE YELLOW 08/11/2012 1917   APPEARANCEUR CLOUDY (A) 08/11/2012 1917   LABSPEC 1.013 08/11/2012 1917   PHURINE 7.5 08/11/2012 1917   GLUCOSEU 100 (A) 08/11/2012 1917   HGBUR TRACE (A) 08/11/2012 1917   BILIRUBINUR NEGATIVE 08/11/2012 1917   KETONESUR NEGATIVE 08/11/2012 1917   PROTEINUR >300 (A) 08/11/2012 1917   UROBILINOGEN 0.2 08/11/2012 1917   NITRITE NEGATIVE 08/11/2012 1917   LEUKOCYTESUR NEGATIVE 08/11/2012 1917   Sepsis Labs: @LABRCNTIP (procalcitonin:4,lacticidven:4) )No results found for this or any previous visit (from the past 240 hour(s)).   Radiological Exams on Admission: Dg Chest Port 1 View  Result Date: 11/02/2017 CLINICAL DATA:  Leukocytosis. EXAM: PORTABLE CHEST 1 VIEW COMPARISON:   Single-view of the chest 10/16/2017, 10/15/2017 and 12/15/2015. FINDINGS: Calcified bilateral pleural plaques are again seen. Chronic coarsening of the pulmonary interstitium bilaterally is also identified and not notably changed since the most recent comparison exams. There is cardiomegaly. Aortic atherosclerosis is noted. No pneumothorax or pleural fluid. IMPRESSION: Findings most compatible with chronic interstitial lung disease with calcified pleural plaques identified. Cardiomegaly. Atherosclerosis. Electronically Signed   By: Drusilla Kannerhomas  Dalessio M.D.   On: 11/02/2017 20:44     EKG: Not done in ED, will get one.   Assessment/Plan Principal Problem:   Hematemesis  Active Problems:   Hypertension   DM (diabetes mellitus), type 2 with renal complications (HCC)   COPD (chronic obstructive pulmonary disease) (HCC)   CAD (coronary artery disease)   ESRD on dialysis (HCC)   Leukocytosis   Atrial fibrillation, chronic (HCC)   PAD (peripheral artery disease) (HCC)   Gangrene of toe of both feet (HCC)   Hematemesis: Hemoglobin stable, 9.1 on 10/26/17-->10.4.  Hemodynamically stable.  Patient denies nausea, vomiting, abdominal pain currently.  No chest pain, dizziness, lightheadedness.  There is no EGD or colonoscopy on record.  - will place on tele bed for obs - NPO for possible EGD - Start IV pantoprazole gtt - Zofran IV for nausea - Avoid NSAIDs and SQ heparin - Maintain IV access (2 large bore IVs if possible). - Monitor closely and follow q6h cbc, transfuse as necessary, if Hgb<7.0 - LaB: INR, PTT and type screen - please call GI in AM  HTN:  -Continue home medications: Metoprolol, nifedipine -IV hydralazine prn  Diet controled DM (diabetes mellitus), type 2 with renal complications (HCC): Last A1c 6.4 on 08/11/12, well controled. Patient is not taking meds at home. Blood sugar 119 -check CBG qAM   CAD: no chest pain. -Continue aspirin, Lipitor, imdur, metoprolol  COPD (chronic  obstructive pulmonary disease) (HCC): stable. No respiratory distress.  No wheezing or rhonchi on auscultation. -Bronchodilators.  ESRD on dialysis (TTS): had HD today. potassium 5.0, bicarbonate 27, creatinine 3.86, BUN 14, -please renal for HD   Atrial Fibrillation: CHA2DS2-VASc Score is 9, needs oral anticoagulation, but patient is no on AC, not sure why pt is not on AC. Now pt has hematemesis, cannot use anticoagulants.  Heart rate is 70-100s. -continue metoprolol   Leukocytosis: WBC 13.8.  Body temperature 91.3.  No source of infection identified.  Chest x-ray showed chronic interstitial change, no infiltration.  -f/u UA  -get Bx  PAD and gangrene of toe of both feet (HCC): per discharge summary from recently previous admission, "she underwent aortogram 8/9-diffuse calcific atherosclerosis, occluded posterior tibials bilaterally, failed attempt at right tibial cannulation. Per vascular surgeons, no endovascular options for revascularization and she is not a candidate for open vascular bypass, only option would be amputation for progressive gangrenous changes". Current plan is to continue narcotics, palliative measures. -continue home fentanyl patch and as needed Dilaudid for pain   DVT ppx: SCD Code Status: per pt's daughter, patient would want to be DNR) Family Communication:   Yes, patient's daugher  at bed side Disposition Plan:  Anticipate discharge back to previous SNF Consults called:  none Admission status: Obs / tele    Date of Service 11/02/2017    Lorretta HarpXilin Calven Gilkes [facility] Pager [phone]  If 7PM-7AM, please contact night-coverage [email or web address] Password [email or web address] 11/02/2017, 10:28 PM

## 2017-11-02 NOTE — ED Triage Notes (Signed)
Patient at dialysis.  Vomited 60 cc blood.  No history of GI.  Hemoccult sent for evaluation.  Dried up gangrene on feet.  CBG 137

## 2017-11-02 NOTE — ED Provider Notes (Signed)
[facility]   CSN: [id number] Arrival date & time:        History   Chief Complaint Chief Complaint  Patient presents with  . Hematemesis    HPI Nicole Cox is a 84 y.o. female.  84 year old female with history of end-stage renal disease, receiving palliative care currently, presents after vomiting approximately 60 cc of dark material while she was receiving dialysis.  Apparently Hemoccult has been sent for evaluation of that.  Patient is self also has a history of encephalopathy and cannot give any history at this time.  Patient sent here for further evaluation.     Past Medical History:  Diagnosis Date  . CHF (congestive heart failure) (HCC)   . COPD (chronic obstructive pulmonary disease) (HCC)   . Coronary artery disease   . Diabetes mellitus without complication (HCC)   . Heart disease   . Hypertension   . Renal disorder   . Stroke [facility](HCC)     Patient Active Problem List   Diagnosis Date Noted  . Pain of great toe   . DNR (do not resuscitate)   . Oropharyngeal dysphagia   . Altered mental status   . ESRD on dialysis (HCC)   . Persistent atrial fibrillation (HCC)   . Dry gangrene (HCC)   . Sepsis (HCC) 10/15/2017  . Delirium 09/25/2017  . Bacteremia   . SIRS (systemic inflammatory response syndrome) (HCC) 07/27/2017  . Malnutrition of moderate degree 06/10/2017  . Pressure injury of skin 06/08/2017  . Acute pulmonary edema (HCC) 06/07/2017  . Respiratory failure with hypoxia (HCC) 12/12/2015  . Acute on chronic respiratory failure with hypoxia (HCC) 12/12/2015  . Fluid overload 12/12/2015  . Palliative care encounter   . Acute respiratory failure with hypoxia (HCC) 08/13/2015  . ESRD on hemodialysis (HCC) 08/13/2015  . Hypertensive urgency 08/13/2015  . Chronic anemia 08/13/2015  . Acute respiratory failure (HCC) 08/13/2015  . HCAP (healthcare-associated pneumonia) 08/11/2012  . Hypoxia 08/11/2012    . Hypertension 08/11/2012  . DM (diabetes mellitus), type 2 with renal complications (HCC) 08/11/2012  . C1 cervical fracture (HCC) 08/11/2012  . Abnormal MRI, thoracic spine 08/11/2012  . COPD (chronic obstructive pulmonary disease) (HCC) 08/11/2012  . CAD (coronary artery disease) 08/11/2012  . Fall 08/11/2012    Past Surgical History:  Procedure Laterality Date  . ABDOMINAL HYSTERECTOMY    . CHOLECYSTECTOMY    . LOWER EXTREMITY ANGIOGRAPHY Bilateral 10/22/2017   Procedure: LOWER EXTREMITY ANGIOGRAPHY Bilateral Runoff;  Surgeon: Fransisco Hertzhen, Brian L, MD;  [facility];  Service: Cardiovascular;  Laterality: Bilateral;  . PERIPHERAL VASCULAR INTERVENTION Right 10/22/2017   Procedure: PERIPHERAL VASCULAR INTERVENTION;  Surgeon: Fransisco Hertzhen, Brian L, MD;  [facility];  Service: Cardiovascular;  Laterality: Right;  limited study unable to cross lesion  . TEE WITHOUT CARDIOVERSION N/A 08/02/2017   Procedure: TRANSESOPHAGEAL ECHOCARDIOGRAM (TEE);  Surgeon: Lars MassonNelson, Katarina H, MD;  [facility];  Service: Cardiovascular;  Laterality: N/A;  . TONSILLECTOMY       OB History   None      Home Medications    Prior to Admission medications   Medication Sig Start Date End Date Taking? Authorizing Provider  acetaminophen (TYLENOL) 325 MG tablet Take 650 mg by mouth every 6 (six) hours as needed.    [provider]  albuterol (PROVENTIL HFA;VENTOLIN HFA) 108 (90 Base) MCG/ACT inhaler Inhale 2 puffs into the lungs every 6 (six) hours as needed  for wheezing or shortness of breath. 08/17/15   Edsel Petrin, DO  aspirin EC 81 MG tablet Take 81 mg by mouth daily.    [provider]  atorvastatin (LIPITOR) 20 MG tablet Take 1 tablet (20 mg total) by mouth daily at 6 PM. 10/25/17   Zannie Cove, MD  doxercalciferol (HECTOROL) 2.5 MCG capsule Take 1 capsule (2.5 mcg total) by mouth Every Tuesday,Thursday,and Saturday with dialysis. 08/03/17   Osvaldo Shipper, MD  fentaNYL (DURAGESIC - DOSED MCG/HR) 25 MCG/HR patch Place 1 patch (25 mcg total) onto the skin every 3 (three) days. 10/27/17   Zannie Cove, MD  fluticasone furoate-vilanterol (BREO ELLIPTA) 100-25 MCG/INH AEPB Inhale 1 puff into the lungs daily. 11/10/15   [provider]  gabapentin (NEURONTIN) 300 MG capsule Take 1 capsule (300 mg total) by mouth at bedtime. 10/25/17   Zannie Cove, MD  HYDROmorphone (DILAUDID) 2 MG tablet Take 1 tablet (2 mg total) by mouth every 4 (four) hours as needed for moderate pain or severe pain. 10/25/17   Zannie Cove, MD  isosorbide mononitrate (IMDUR) 30 MG 24 hr tablet Take 30 mg by mouth every evening. 03/21/17   [provider]  lidocaine-prilocaine (EMLA) cream Apply 1 application topically every other day. 10/21/15   [provider]  Melatonin 3 MG TABS Take 3 mg by mouth at bedtime.    [provider]  metoprolol tartrate (LOPRESSOR) 25 MG tablet Take 0.5 tablets (12.5 mg total) by mouth 2 (two) times daily. Patient taking differently: Take 12.5 mg by mouth daily.  06/10/17   Regalado, Belkys A, MD  NIFEdipine (PROCARDIA XL/ADALAT-CC) 60 MG 24 hr tablet Take 60 mg by mouth 2 (two) times daily. 12/03/15   [provider]  Nutritional Supplements (FEEDING SUPPLEMENT, NEPRO CARB STEADY,) LIQD Take 237 mLs by mouth 2 (two) times daily between meals. Patient not taking: Reported on 10/15/2017 06/09/17   Regalado, Jon Billings A, MD  polyethylene glycol (MIRALAX / GLYCOLAX) packet Take 17 g by mouth daily. 10/25/17   Zannie Cove, MD  umeclidinium bromide (INCRUSE ELLIPTA) 62.5 MCG/INH AEPB Inhale 1 puff into the lungs daily.    [provider]    Family History Family History  Problem Relation Age of Onset  . Heart disease Mother   . Hypertension Mother   . Heart disease Father   . Hypertension Father   . Heart disease Sister   . Diabetes Sister   . Stroke Sister   . Kidney disease Sister   .  Hypertension Sister   . Heart disease Sister   . Kidney disease Sister   . Hypertension Sister   . Kidney disease Daughter   . Hypertension Daughter     Social History Social History   Tobacco Use  . Smoking status: Former Games developer  . Smokeless tobacco: Never Used  Substance Use Topics  . Alcohol use: No  . Drug use: No     Allergies   Patient has no known allergies.   Review of Systems Review of Systems  Unable to perform ROS: Mental status change     Physical Exam Updated Vital Signs BP (!) 153/70 (BP Location: Right Arm)   Pulse 87   Temp 99.3 F (37.4 C) (Oral)   Resp 16   SpO2 99%   Physical Exam  Constitutional: She appears well-developed and well-nourished. She appears lethargic.  Non-toxic appearance. No distress.  HENT:  Head: Normocephalic and atraumatic.  Eyes: Pupils are equal, round, and reactive to  light. Conjunctivae, EOM and lids are normal.  Neck: Normal range of motion. Neck supple. No tracheal deviation present. No thyroid mass present.  Cardiovascular: Normal rate, regular rhythm and normal heart sounds. Exam reveals no gallop.  No murmur heard. Pulmonary/Chest: Effort normal and breath sounds normal. No stridor. No respiratory distress. She has no decreased breath sounds. She has no wheezes. She has no rhonchi. She has no rales.  Abdominal: Soft. Normal appearance and bowel sounds are normal. She exhibits no distension. There is no tenderness. There is no rebound and no CVA tenderness.  Genitourinary:     Musculoskeletal: Normal range of motion. She exhibits no edema or tenderness.  Neurological: She appears lethargic. She is disoriented. She displays atrophy. GCS eye subscore is 4. GCS verbal subscore is 3. GCS motor subscore is 5.  Skin: Skin is warm and dry. No abrasion and no rash noted.  Psychiatric: She is inattentive.  Nursing note and vitals reviewed.    ED Treatments / Results  Labs (all labs ordered are listed, but only  abnormal results are displayed) Labs Reviewed  CBC WITH DIFFERENTIAL/PLATELET  COMPREHENSIVE METABOLIC PANEL  TYPE AND SCREEN    EKG None  Radiology No results found.  Procedures Procedures (including critical care time)  Medications Ordered in ED Medications  0.9 %  sodium chloride infusion (has no administration in time range)     Initial Impression / Assessment and Plan / ED Course  I have reviewed the triage vital signs and the nursing notes.  Pertinent labs & imaging results that were available during my care of the patient were reviewed by me and considered in my medical decision making (see chart for details).     Patient presents after having hematemesis witnessed by dialysis staff.  Hemoglobin is stable here.  She has no abdominal discomfort.  She is more alert at this time.  Will consult hospitalist for admission for evaluation of upper GI bleed  Final Clinical Impressions(s) / ED Diagnoses   Final diagnoses:  None    ED Discharge Orders    None       Lorre NickAllen, Kyani Simkin, MD 11/02/17 [phone]

## 2017-11-03 DIAGNOSIS — I96 Gangrene, not elsewhere classified: Secondary | ICD-10-CM

## 2017-11-03 DIAGNOSIS — K920 Hematemesis: Secondary | ICD-10-CM | POA: Diagnosis not present

## 2017-11-03 DIAGNOSIS — I739 Peripheral vascular disease, unspecified: Secondary | ICD-10-CM

## 2017-11-03 DIAGNOSIS — I482 Chronic atrial fibrillation: Secondary | ICD-10-CM | POA: Diagnosis not present

## 2017-11-03 DIAGNOSIS — I10 Essential (primary) hypertension: Secondary | ICD-10-CM

## 2017-11-03 DIAGNOSIS — J449 Chronic obstructive pulmonary disease, unspecified: Secondary | ICD-10-CM | POA: Diagnosis not present

## 2017-11-03 DIAGNOSIS — K922 Gastrointestinal hemorrhage, unspecified: Secondary | ICD-10-CM | POA: Diagnosis present

## 2017-11-03 DIAGNOSIS — N186 End stage renal disease: Secondary | ICD-10-CM

## 2017-11-03 DIAGNOSIS — Z992 Dependence on renal dialysis: Secondary | ICD-10-CM

## 2017-11-03 DIAGNOSIS — E1121 Type 2 diabetes mellitus with diabetic nephropathy: Secondary | ICD-10-CM | POA: Diagnosis not present

## 2017-11-03 LAB — PROCALCITONIN: PROCALCITONIN: 0.22 ng/mL

## 2017-11-03 LAB — CBC
HCT: 30.9 % — ABNORMAL LOW (ref 36.0–46.0)
HCT: 31.2 % — ABNORMAL LOW (ref 36.0–46.0)
HCT: 33.6 % — ABNORMAL LOW (ref 36.0–46.0)
HEMOGLOBIN: 8.9 g/dL — AB (ref 12.0–15.0)
Hemoglobin: 8.7 g/dL — ABNORMAL LOW (ref 12.0–15.0)
Hemoglobin: 9.7 g/dL — ABNORMAL LOW (ref 12.0–15.0)
MCH: 26 pg (ref 26.0–34.0)
MCH: 26.1 pg (ref 26.0–34.0)
MCH: 26.4 pg (ref 26.0–34.0)
MCHC: 28.9 g/dL — ABNORMAL LOW (ref 30.0–36.0)
MCHC: 28.5 g/dL — ABNORMAL LOW (ref 30.0–36.0)
MCHC: 28.2 g/dL — AB (ref 30.0–36.0)
MCV: 91.3 fL (ref 78.0–100.0)
MCV: 91.5 fL (ref 78.0–100.0)
MCV: 92.5 fL (ref 78.0–100.0)
PLATELETS: 321 10*3/uL (ref 150–400)
PLATELETS: 334 10*3/uL (ref 150–400)
Platelets: 324 10*3/uL (ref 150–400)
RBC: 3.34 MIL/uL — ABNORMAL LOW (ref 3.87–5.11)
RBC: 3.41 MIL/uL — ABNORMAL LOW (ref 3.87–5.11)
RBC: 3.68 MIL/uL — ABNORMAL LOW (ref 3.87–5.11)
RDW: 20.1 % — ABNORMAL HIGH (ref 11.5–15.5)
RDW: 20.3 % — AB (ref 11.5–15.5)
RDW: 20.3 % — ABNORMAL HIGH (ref 11.5–15.5)
WBC: 11 10*3/uL — AB (ref 4.0–10.5)
WBC: 13.5 10*3/uL — ABNORMAL HIGH (ref 4.0–10.5)
WBC: 9.3 10*3/uL (ref 4.0–10.5)

## 2017-11-03 LAB — PROTIME-INR
INR: 1.05
PROTHROMBIN TIME: 13.6 s (ref 11.4–15.2)

## 2017-11-03 LAB — BASIC METABOLIC PANEL
ANION GAP: 12 (ref 5–15)
BUN: 17 mg/dL (ref 8–23)
CHLORIDE: 101 mmol/L (ref 98–111)
CO2: 25 mmol/L (ref 22–32)
Calcium: 8.7 mg/dL — ABNORMAL LOW (ref 8.9–10.3)
Creatinine, Ser: 4.43 mg/dL — ABNORMAL HIGH (ref 0.44–1.00)
GFR calc non Af Amer: 8 mL/min — ABNORMAL LOW (ref >60)
GFR, EST AFRICAN AMERICAN: 10 mL/min — AB (ref >60)
Glucose, Bld: 77 mg/dL (ref 70–99)
Potassium: 5 mmol/L (ref 3.5–5.1)
Sodium: 138 mmol/L (ref 135–145)

## 2017-11-03 LAB — APTT: aPTT: 22 seconds — ABNORMAL LOW (ref 24–36)

## 2017-11-03 LAB — HEMOGLOBIN A1C
Hgb A1c MFr Bld: 4.6 % — ABNORMAL LOW (ref 4.8–5.6)
Mean Plasma Glucose: 85.32 mg/dL

## 2017-11-03 MED ORDER — UMECLIDINIUM BROMIDE 62.5 MCG/INH IN AEPB
1.0000 | INHALATION_SPRAY | Freq: Every day | RESPIRATORY_TRACT | Status: DC
  Administered 2017-11-05: 1 via RESPIRATORY_TRACT

## 2017-11-03 MED ORDER — SODIUM CHLORIDE 0.9 % IV SOLN
INTRAVENOUS | Status: DC
  Administered 2017-11-03: 22:00:00 via INTRAVENOUS
  Administered 2017-11-04: 500 mL via INTRAVENOUS

## 2017-11-03 MED ORDER — CHLORHEXIDINE GLUCONATE CLOTH 2 % EX PADS
6.0000 | MEDICATED_PAD | Freq: Every day | CUTANEOUS | Status: DC
  Administered 2017-11-04 – 2017-11-05 (×2): 6 via TOPICAL

## 2017-11-03 MED ORDER — FENTANYL 25 MCG/HR TD PT72: 25.0000 ug | MEDICATED_PATCH | TRANSDERMAL | Status: DC

## 2017-11-03 MED ORDER — FLUTICASONE FUROATE-VILANTEROL 100-25 MCG/INH IN AEPB
1.0000 | INHALATION_SPRAY | Freq: Every day | RESPIRATORY_TRACT | Status: DC
  Administered 2017-11-05: 1 via RESPIRATORY_TRACT

## 2017-11-03 NOTE — Clinical Social Work Note (Signed)
Clinical Social Work Assessment  Patient Details  Name: Nicole Cox MRN: [id number] Date of Birth: [birth year]  Date of referral:  11/03/17               Reason for consult:  Discharge Planning                Permission sought to share information with:  Facility Medical sales representativeContact Representative, Family Supports Permission granted to share information::     Name::     Zachery DauerShakoya Cox  Agency::  [facility]  Relationship::  Daughter  Contact Information:  [phone]  Housing/Transportation Living arrangements for the past 2 months:  Skilled Nursing Facility Source of Information:  Medical Team, Adult Children, Facility Patient Interpreter Needed:  None Criminal Activity/Legal Involvement Pertinent to Current Situation/Hospitalization:  No - Comment as needed Significant Relationships:  Adult Children Lives with:  Facility Resident Do you feel safe going back to the place where you live?  Yes Need for family participation in patient care:  Yes (Comment)  Care giving concerns:  Patient is a long-term resident [facility].   Social Worker assessment / plan:  Patient not fully oriented. No supports at bedside. CSW called patient's daughter (Nicole Cox), introduced role, and explained that discharge planning would be discussed. Patient's daughter confirmed she [facility] and the plan is for her to return at discharge. Per admissions coordinator patient is a long-term resident under her pending Medicaid. She stated [employer] has told them they will no longer approve rehab services. No further concerns. CSW encouraged patient's daughter to contact CSW as needed. CSW will continue to follow patient and her daughter for support and facilitate discharge back to SNF once medically stable.  Employment status:  Retired Database administratornsurance information:  Managed Medicare PT Recommendations:  Not assessed at this time Information / Referral to community resources:  Skilled Nursing  Facility  Patient/Family's Response to care:  Patient not fully oriented. Patient's daughter agreeable to return to SNF. Patient's children supportive and involved in patient's care. Patient's daughter appreciated social work intervention.  Patient/Family's Understanding of and Emotional Response to Diagnosis, Current Treatment, and Prognosis:  Patient not fully oriented. Patient's daughter has a good understanding of the reason for admission and plan to return to SNF at discharge. Patient's daughter appears happy with hospital care.  Emotional Assessment Appearance:  Appears stated age Attitude/Demeanor/Rapport:  Unable to Assess Affect (typically observed):  Unable to Assess Orientation:  Oriented to Self Alcohol / Substance use:  Never Used Psych involvement (Current and /or in the community):  No (Comment)  Discharge Needs  Concerns to be addressed:  Care Coordination Readmission within the last 30 days:  Yes Current discharge risk:  None Barriers to Discharge:  Continued Medical Work up   Nicole LinerSarah C Eva Vallee, LCSW 11/03/2017, 12:27 PM

## 2017-11-03 NOTE — Progress Notes (Addendum)
Pt is deeply asleep, she responds to voice but does not fully arouses, she was able to tell me that she was not in pain, just very sleepy. Will continue to monitor. GI PA is aware.

## 2017-11-03 NOTE — Progress Notes (Signed)
Meds no given because patient is deeply asleep and only responds to voice. GI MD aware. Will continue to monitor.

## 2017-11-03 NOTE — Progress Notes (Signed)
[facility]. Alert to self, time, and situation. Disoriented to place. Admitted for hematemesis. VSS. No complaint of pain. Sacrum stage 2 wound(excoriated). Foam dressing applied. SR on telemetry. General plan of care initiated. High fall risk.

## 2017-11-03 NOTE — Progress Notes (Signed)
PROGRESS NOTE  Nicole Cox ZOX:[id number] DOB: April 30, 1931 DOA: 11/02/2017 PCP: [name], J Stewart, MD  HPI/Recap of past 24 hours: Nicole Cox is a 84 y.o. female with medical history significant of hypertension, hyperlipidemia, diet-controlled diabetes, COPD, stroke, CHF, CAD, atrial fibrillation not on anticoagulants, ESRD-HD (TTS), PAD with bilateral gangrene feet, dysphagia, who presents with hematemesis. Per report, pt had nausea and vomited approximately 60 cc of dark material while she was receiving dialysis on 8/20. Of note, patient was recently hospitalized from 8/2-8/14 due to aspiration pneumonia, acute metabolic encephalopathy and PAD with gangrenous changes in both toes. Pt admitted for further management.   Today, pt noted to be very sleepy, although easily arousable, but falls back asleep right away. Unable to ROS  Assessment/Plan: Principal Problem:   Hematemesis Active Problems:   Hypertension   DM (diabetes mellitus), type 2 with renal complications (HCC)   COPD (chronic obstructive pulmonary disease) (HCC)   CAD (coronary artery disease)   ESRD on dialysis (HCC)   Leukocytosis   Atrial fibrillation, chronic (HCC)   PAD (peripheral artery disease) (HCC)   Gangrene of toe of both feet (HCC)  Hematemesis, ?UGIB Hemoglobin dropped to 8.9, from 10/26/17-->10.4 Hemodynamically stable No record of EGD or colonoscopy on file GI consulted: plan for EGD in am, NPO at midnight Continue IV pantoprazole gtt Monitor closely, transfuse if Hgb<7.0  Acute metabolic encephalopathy Noted to be very sleepy/lethargic ?? Opoid induced Vs infection Afebrile, resolved leukocytosis BC X 2 pending Procalcitonin pending CXR no acute abnormality Has a fentanyl patch on, held dilaudid and gabapentin for now Monitor closely   HTN Continue home medications: Metoprolol, nifedipine IV hydralazine prn  Diet controled DM type 2 Last A1c 6.4 on 08/11/12, will repeat CBG  Qam  CAD No chest pain. Continue aspirin, Lipitor, imdur, metoprolol  COPD Stable Continue bronchodilators.  ESRD on dialysis (TTS) Nephrology for HD  Atrial Fibrillation Rate controlled CHA2DS2-VASc Score is 9, not on chronic AC Continue metoprolol  PAD and gangrene of toe of both feet (HCC): per discharge summary from recently previous admission, "she underwent aortogram 8/9-diffuse calcific atherosclerosis, occluded posterior tibials bilaterally, failed attempt at right tibial cannulation. Per vascular surgeons, no endovascular options for revascularization and she is not a candidate for open vascular bypass, only option would be amputation for progressive gangrenous changes". Current plan is to continue narcotics, palliative measures. Continue home fentanyl patch, held Dilaudid, gabapentin for now due to AMS    Code Status: DNR  Family Communication: None at bedside   Disposition Plan: Once work up is complete     Consultants:  GI   Procedures:  None   Antimicrobials:  None  DVT prophylaxis:  SCDs   Objective: Vitals:   11/02/17 2300 11/03/17 0028 11/03/17 0543 11/03/17 1155  BP:  (!) 123/52 (!) 131/51 (!) 123/46  Pulse:  71 62 66  Resp:  16 18 18   Temp:  98.5 F (36.9 C) 98.3 F (36.8 C) 98.2 F (36.8 C)  TempSrc:  Oral Oral   SpO2:  96% 100% 100%  Weight: 56 kg  55 kg   Height: 5\' 2"  (1.575 m)       Intake/Output Summary (Last 24 hours) at 11/03/2017 1554 Last data filed at 11/03/2017 1417 Gross per 24 hour  Intake 549.7 ml  Output 0 ml  Net 549.7 ml   Filed Weights   11/02/17 2300 11/03/17 0543  Weight: 56 kg 55 kg    Exam:   General:  Lethargic, sleepy  Cardiovascular: S1, S2 present  Respiratory: CTAB   Abdomen: Soft, NT, ND, BS present  Musculoskeletal: No pedal edema b/l   Skin: Gangrenous changes in both toes  Psychiatry: Unable to assess   Data Reviewed: CBC: Recent Labs  Lab 11/02/17 1812 11/02/17 2330  11/03/17 0731 11/03/17 1435  WBC 13.8* 13.5* 11.0* 9.3  NEUTROABS 10.6*  --   --   --   HGB 10.4* 8.7* 9.7* 8.9*  HCT 36.1 30.9* 33.6* 31.2*  MCV 90.9 92.5 91.3 91.5  PLT 315 321 334 324   Basic Metabolic Panel: Recent Labs  Lab 11/02/17 1812 11/03/17 0731  NA 138 138  K 5.0 5.0  CL 100 101  CO2 27 25  GLUCOSE 119* 77  BUN 14 17  CREATININE 3.86* 4.43*  CALCIUM 8.9 8.7*   GFR: Estimated Creatinine Clearance: 7.6 mL/min (A) (by C-G formula based on SCr of 4.43 mg/dL (H)). Liver Function Tests: Recent Labs  Lab 11/02/17 1812  AST 51*  ALT 27  ALKPHOS 118  BILITOT 0.4  PROT 7.8  ALBUMIN 2.5*   No results for input(s): LIPASE, AMYLASE in the last 168 hours. No results for input(s): AMMONIA in the last 168 hours. Coagulation Profile: Recent Labs  Lab 11/02/17 2330  INR 1.05   Cardiac Enzymes: No results for input(s): CKTOTAL, CKMB, CKMBINDEX, TROPONINI in the last 168 hours. BNP (last 3 results) No results for input(s): PROBNP in the last 8760 hours. HbA1C: No results for input(s): HGBA1C in the last 72 hours. CBG: No results for input(s): GLUCAP in the last 168 hours. Lipid Profile: No results for input(s): CHOL, HDL, LDLCALC, TRIG, CHOLHDL, LDLDIRECT in the last 72 hours. Thyroid Function Tests: No results for input(s): TSH, T4TOTAL, FREET4, T3FREE, THYROIDAB in the last 72 hours. Anemia Panel: No results for input(s): VITAMINB12, FOLATE, FERRITIN, TIBC, IRON, RETICCTPCT in the last 72 hours. Urine analysis:    Component Value Date/Time   COLORURINE YELLOW 08/11/2012 1917   APPEARANCEUR CLOUDY (A) 08/11/2012 1917   LABSPEC 1.013 08/11/2012 1917   PHURINE 7.5 08/11/2012 1917   GLUCOSEU 100 (A) 08/11/2012 1917   HGBUR TRACE (A) 08/11/2012 1917   BILIRUBINUR NEGATIVE 08/11/2012 1917   KETONESUR NEGATIVE 08/11/2012 1917   PROTEINUR >300 (A) 08/11/2012 1917   UROBILINOGEN 0.2 08/11/2012 1917   NITRITE NEGATIVE 08/11/2012 1917   LEUKOCYTESUR NEGATIVE  08/11/2012 1917   Sepsis Labs: @LABRCNTIP (procalcitonin:4,lacticidven:4)  ) Recent Results (from the past 240 hour(s))  Culture, blood (Routine X 2) w Reflex to ID Panel     Status: None (Preliminary result)   Collection Time: 11/02/17 11:45 PM  Result Value Ref Range Status   Specimen Description BLOOD RIGHT HAND  Final   Special Requests   Final    BOTTLES DRAWN AEROBIC ONLY Blood Culture results may not be optimal due to an inadequate volume of blood received in culture bottles   Culture PENDING  Incomplete   Report Status PENDING  Incomplete      Studies: Dg Chest Port 1 View  Result Date: 11/02/2017 CLINICAL DATA:  Leukocytosis. EXAM: PORTABLE CHEST 1 VIEW COMPARISON:  Single-view of the chest 10/16/2017, 10/15/2017 and 12/15/2015. FINDINGS: Calcified bilateral pleural plaques are again seen. Chronic coarsening of the pulmonary interstitium bilaterally is also identified and not notably changed since the most recent comparison exams. There is cardiomegaly. Aortic atherosclerosis is noted. No pneumothorax or pleural fluid. IMPRESSION: Findings most compatible with chronic interstitial lung disease with calcified pleural plaques identified. Cardiomegaly. Atherosclerosis. Electronically Signed  By: Drusilla Kannerhomas  Dalessio M.D.   On: 11/02/2017 20:44    Scheduled Meds: . atorvastatin  20 mg Oral QPM  . [START ON 11/04/2017] doxercalciferol  2.5 mcg Oral Q T,Th,Sa-HD  . feeding supplement (NEPRO CARB STEADY)  237 mL Oral BID BM  . fluticasone furoate-vilanterol  1 puff Inhalation Daily  . isosorbide mononitrate  30 mg Oral QPM  . lidocaine-prilocaine  1 application Topical Daily  . Melatonin  3 mg Oral QHS  . metoprolol tartrate  12.5 mg Oral BID  . NIFEdipine  60 mg Oral BID  . [START ON 11/06/2017] pantoprazole  40 mg Intravenous Q12H  . umeclidinium bromide  1 puff Inhalation Daily    Continuous Infusions: . sodium chloride 10 mL/hr at 11/02/17 2322  . pantoprozole (PROTONIX)  infusion 8 mg/hr (11/03/17 0935)     LOS: 0 days     Briant CedarNkeiruka J Ezenduka, MD [facility]   If 7PM-7AM, please contact night-coverage [email or web address] Password [email or web address] 11/03/2017, 3:54 PM

## 2017-11-03 NOTE — Progress Notes (Signed)
Called to provide dialysis for pt Nicole Cox- just discharged on 8/14 and this will be 4th hospitalization since 5/14 for chronically ill pt - ESRD, PAD that cannot be revascularized, NHP- is DNR but refusing hospice assist and wanting to continue HD for now.  Admitted with reported hematemesis at op HD yesterday.  Hgb stable- GI to see and possibly to have EGD.  She will be due for HD tomorrow- she is currently obs status.    Dialysis Orders: [facility]/ Dr Louis MeckelStovall TTS 3h 121lbs 2/2.5 Hep 2000 then 250 / hr AVG  - [id number] q 2 weeks-  -hect 1 mcg IV q tx  Will do HD without heparin - 2 K - 3.5 hours  Clemon Devaul A

## 2017-11-03 NOTE — NC FL2 (Signed)
[place] MEDICAID FL2 LEVEL OF CARE SCREENING TOOL     IDENTIFICATION  Patient Name: Nicole Cox Birthdate: Jul 05, 1931 Sex: female Admission Date (Current Location): 11/02/2017  [facility] [place]   Facility and Address:  The . [facility], [address], [place], [place] [address]      Provider Number: [id number]  Attending Physician Name and Address:  Briant CedarEzenduka, Nkeiruka J, MD  Relative Name and Phone Number:       Current Level of Care: Hospital Recommended Level of Care: Skilled Nursing Facility Prior Approval Number:    Date Approved/Denied:   PASRR Number: [id number]  Discharge Plan: SNF    Current Diagnoses: Patient Active Problem List   Diagnosis Date Noted  . Hematemesis 11/02/2017  . Leukocytosis 11/02/2017  . Atrial fibrillation, chronic (HCC) 11/02/2017  . PAD (peripheral artery disease) (HCC) 11/02/2017  . Gangrene of toe of both feet (HCC) 11/02/2017  . Pain of great toe   . DNR (do not resuscitate)   . Oropharyngeal dysphagia   . Altered mental status   . ESRD on dialysis (HCC)   . Persistent atrial fibrillation (HCC)   . Dry gangrene (HCC)   . Sepsis (HCC) 10/15/2017  . Delirium 09/25/2017  . Bacteremia   . SIRS (systemic inflammatory response syndrome) (HCC) 07/27/2017  . Malnutrition of moderate degree 06/10/2017  . Pressure injury of skin 06/08/2017  . Acute pulmonary edema (HCC) 06/07/2017  . Respiratory failure with hypoxia (HCC) 12/12/2015  . Acute on chronic respiratory failure with hypoxia (HCC) 12/12/2015  . Fluid overload 12/12/2015  . Palliative care encounter   . Acute respiratory failure with hypoxia (HCC) 08/13/2015  . ESRD on hemodialysis (HCC) 08/13/2015  . Hypertensive urgency 08/13/2015  . Chronic anemia 08/13/2015  . Acute respiratory failure (HCC) 08/13/2015  . HCAP (healthcare-associated pneumonia) 08/11/2012  . Hypoxia 08/11/2012  . Hypertension 08/11/2012  . DM (diabetes  mellitus), type 2 with renal complications (HCC) 08/11/2012  . C1 cervical fracture (HCC) 08/11/2012  . Abnormal MRI, thoracic spine 08/11/2012  . COPD (chronic obstructive pulmonary disease) (HCC) 08/11/2012  . CAD (coronary artery disease) 08/11/2012  . Fall 08/11/2012    Orientation RESPIRATION BLADDER Height & Weight     Self  O2(Nasal Canula 2 L) Continent Weight: 121 lb 4.1 oz (55 kg) Height:  5\' 2"  (157.5 cm)  BEHAVIORAL SYMPTOMS/MOOD NEUROLOGICAL BOWEL NUTRITION STATUS  (None) (None) Incontinent Diet(Clear liquid)  AMBULATORY STATUS COMMUNICATION OF NEEDS Skin     Verbally Skin abrasions, Other (Comment), PU Stage and Appropriate Care(Cracking, Excoriated, MASD. Deep tissue injury on left heel: Foam every 5 days.)   PU Stage 2 Dressing: (Mid sacrum: Foam every 5 days.)                   Personal Care Assistance Level of Assistance              Functional Limitations Info  Sight, Hearing, Speech Sight Info: Adequate Hearing Info: Adequate Speech Info: Adequate    SPECIAL CARE FACTORS FREQUENCY  Blood pressure                    Contractures Contractures Info: Not present    Additional Factors Info  Code Status, Allergies Code Status Info: DNR Allergies Info: NKDA           Current Medications (11/03/2017):  This is the current hospital active medication list Current Facility-Administered Medications  Medication Dose Route Frequency Provider Last  Rate Last Dose  . 0.9 %  sodium chloride infusion   Intravenous Continuous Lorretta HarpNiu, Xilin, MD 10 mL/hr at 11/02/17 2322    . acetaminophen (TYLENOL) tablet 650 mg  650 mg Oral Q6H PRN Lorretta HarpNiu, Xilin, MD   650 mg at 11/03/17 0015   Or  . acetaminophen (TYLENOL) suppository 650 mg  650 mg Rectal Q6H PRN Lorretta HarpNiu, Xilin, MD      . albuterol (PROVENTIL) (2.5 MG/3ML) 0.083% nebulizer solution 2.5 mg  2.5 mg Nebulization Q4H PRN Lorretta HarpNiu, Xilin, MD      . atorvastatin (LIPITOR) tablet 20 mg  20 mg Oral QPM Lorretta HarpNiu, Xilin, MD   20  mg at 11/03/17 0021  . [START ON 11/04/2017] doxercalciferol (HECTOROL) capsule 2.5 mcg  2.5 mcg Oral Q T,Th,Sa-HD Lorretta HarpNiu, Xilin, MD      . feeding supplement (NEPRO CARB STEADY) liquid 237 mL  237 mL Oral BID BM Lorretta HarpNiu, Xilin, MD      . fluticasone furoate-vilanterol (BREO ELLIPTA) 100-25 MCG/INH 1 puff  1 puff Inhalation Daily Lorretta HarpNiu, Xilin, MD      . gabapentin (NEURONTIN) capsule 300 mg  300 mg Oral QHS Lorretta HarpNiu, Xilin, MD   300 mg at 11/03/17 0017  . hydrALAZINE (APRESOLINE) injection 5 mg  5 mg Intravenous Q2H PRN Lorretta HarpNiu, Xilin, MD      . HYDROmorphone (DILAUDID) tablet 1 mg  1 mg Oral Q4H PRN Lorretta HarpNiu, Xilin, MD      . isosorbide mononitrate (IMDUR) 24 hr tablet 30 mg  30 mg Oral QPM Lorretta HarpNiu, Xilin, MD   30 mg at 11/03/17 0014  . lidocaine-prilocaine (EMLA) cream 1 application  1 application Topical Daily Lorretta HarpNiu, Xilin, MD      . Melatonin TABS 3 mg  3 mg Oral QHS Lorretta HarpNiu, Xilin, MD   3 mg at 11/03/17 0015  . metoprolol tartrate (LOPRESSOR) tablet 12.5 mg  12.5 mg Oral BID Lorretta HarpNiu, Xilin, MD   12.5 mg at 11/03/17 0016  . NIFEdipine (PROCARDIA XL/ADALAT-CC) 24 hr tablet 60 mg  60 mg Oral BID Lorretta HarpNiu, Xilin, MD   60 mg at 11/03/17 0014  . ondansetron (ZOFRAN) tablet 4 mg  4 mg Oral Q6H PRN Lorretta HarpNiu, Xilin, MD       Or  . ondansetron [facility](ZOFRAN) injection 4 mg  4 mg Intravenous Q6H PRN Lorretta HarpNiu, Xilin, MD      . pantoprazole (PROTONIX) 80 mg in sodium chloride 0.9 % 250 mL (0.32 mg/mL) infusion  8 mg/hr Intravenous Continuous Lorretta HarpNiu, Xilin, MD 25 mL/hr at 11/03/17 0935 8 mg/hr at 11/03/17 0935  . [START ON 11/06/2017] pantoprazole (PROTONIX) injection 40 mg  40 mg Intravenous Q12H Lorretta HarpNiu, Xilin, MD      . polyethylene glycol (MIRALAX / GLYCOLAX) packet 17 g  17 g Oral Daily PRN Lorretta HarpNiu, Xilin, MD      . umeclidinium bromide (INCRUSE ELLIPTA) 62.5 MCG/INH 1 puff  1 puff Inhalation Daily Lorretta HarpNiu, Xilin, MD      . zolpidem (AMBIEN) tablet 5 mg  5 mg Oral QHS PRN Lorretta HarpNiu, Xilin, MD   5 mg at 11/03/17 0015     Discharge Medications: Please see discharge summary for  a list of discharge medications.  Relevant Imaging Results:  Relevant Lab Results:   Additional Information SS#: [id number]. HD TTS [facility].  Margarito LinerSarah C Amr Stur[place], LCSW

## 2017-11-03 NOTE — Consult Note (Addendum)
Consultation  Referring Provider: Dr. Sharolyn DouglasEzenduka    Primary Care Physician:  [name], J Stewart, MD Primary Gastroenterologist:  Gentry FitzUnassigned       Reason for Consultation: Hematemesis             HPI:   [name] is a 84 y.o. female with a past medical history of CHF, COPD, CAD, diabetes and ESRD on hemodialysis, who presented to the hospital on 11/02/2017 after an episode of hematemesis while receiving dialysis.    Per previous physician's report, patient had nausea and vomited approximately 60 cc of dark material while she was receiving dialysis yesterday.  Recent hospitalization 8/2-8/14 due to aspiration pneumonia, acute metabolic encephalopathy and PAD with gangrenous changes in both toes.    At the time of my exam today, patient would only wake up enough to open her eyes but then fell back asleep shortly thereafter and was unable to answer any of my questions.  Did discuss with nursing staff who have only been able to get from her that she would like to be left alone and has no abdominal pain or complaints.  She has had no further nausea or vomiting and has not had a bowel movement since being on the floor.  ED course: White blood cell count 13.8, hemoglobin 10.4 menses 9.1 on 10/26/2017), potassium 5, bicarb 27, creatinine 3.86, BUN 14, tachycardia, oxygen saturation 60% on room air which improved to 100% on 2 L nasal cannula oxygen  Past Medical History:  Diagnosis Date  . CHF (congestive heart failure) (HCC)   . COPD (chronic obstructive pulmonary disease) (HCC)   . Coronary artery disease   . Diabetes mellitus without complication (HCC)   . Heart disease   . Hypertension   . Renal disorder   . Stroke [facility](HCC)     Past Surgical History:  Procedure Laterality Date  . ABDOMINAL HYSTERECTOMY    . CHOLECYSTECTOMY    . LOWER EXTREMITY ANGIOGRAPHY Bilateral 10/22/2017   Procedure: LOWER EXTREMITY ANGIOGRAPHY Bilateral Runoff;  Surgeon: Fransisco Hertzhen, Brian L, MD;  [facility];   Service: Cardiovascular;  Laterality: Bilateral;  . PERIPHERAL VASCULAR INTERVENTION Right 10/22/2017   Procedure: PERIPHERAL VASCULAR INTERVENTION;  Surgeon: Fransisco Hertzhen, Brian L, MD;  [facility];  Service: Cardiovascular;  Laterality: Right;  limited study unable to cross lesion  . TEE WITHOUT CARDIOVERSION N/A 08/02/2017   Procedure: TRANSESOPHAGEAL ECHOCARDIOGRAM (TEE);  Surgeon: Lars MassonNelson, Katarina H, MD;  [facility];  Service: Cardiovascular;  Laterality: N/A;  . TONSILLECTOMY      Family History  Problem Relation Age of Onset  . Heart disease Mother   . Hypertension Mother   . Heart disease Father   . Hypertension Father   . Heart disease Sister   . Diabetes Sister   . Stroke Sister   . Kidney disease Sister   . Hypertension Sister   . Heart disease Sister   . Kidney disease Sister   . Hypertension Sister   . Kidney disease Daughter   . Hypertension Daughter     Social History   Tobacco Use  . Smoking status: Former Games developermoker  . Smokeless tobacco: Never Used  Substance Use Topics  . Alcohol use: No  . Drug use: No    Prior to Admission medications   Medication Sig Start Date End Date Taking? Authorizing Provider  acetaminophen (TYLENOL) 325 MG tablet Take 650 mg by mouth every 6 (six) hours as needed for mild pain.  Yes [provider]  albuterol (PROVENTIL HFA;VENTOLIN HFA) 108 (90 Base) MCG/ACT inhaler Inhale 2 puffs into the lungs every 6 (six) hours as needed for wheezing or shortness of breath. 08/17/15  Yes Mikhail, Nita SellsMaryann, DO  aspirin EC 81 MG tablet Take 81 mg by mouth daily.   Yes [provider]  atorvastatin (LIPITOR) 20 MG tablet Take 1 tablet (20 mg total) by mouth daily at 6 PM. Patient taking differently: Take 20 mg by mouth every evening.  10/25/17  Yes Zannie CoveJoseph, Preetha, MD  doxercalciferol (HECTOROL) 2.5 MCG capsule Take 1 capsule (2.5 mcg total) by mouth Every Tuesday,Thursday,and Saturday with dialysis. 08/03/17  Yes  Osvaldo ShipperKrishnan, Gokul, MD  fentaNYL (DURAGESIC - DOSED MCG/HR) 25 MCG/HR patch Place 1 patch (25 mcg total) onto the skin every 3 (three) days. 10/27/17  Yes Zannie CoveJoseph, Preetha, MD  fluticasone furoate-vilanterol (BREO ELLIPTA) 100-25 MCG/INH AEPB Inhale 1 puff into the lungs daily. 11/10/15  Yes [provider]  gabapentin (NEURONTIN) 300 MG capsule Take 1 capsule (300 mg total) by mouth at bedtime. 10/25/17  Yes Zannie CoveJoseph, Preetha, MD  HYDROmorphone (DILAUDID) 2 MG tablet Take 1 tablet (2 mg total) by mouth every 4 (four) hours as needed for moderate pain or severe pain. 10/25/17  Yes Zannie CoveJoseph, Preetha, MD  isosorbide mononitrate (IMDUR) 30 MG 24 hr tablet Take 30 mg by mouth every evening. 03/21/17  Yes [provider]  lidocaine-prilocaine (EMLA) cream Apply 1 application topically daily.  10/21/15  Yes [provider]  Melatonin 3 MG TABS Take 3 mg by mouth at bedtime.   Yes [provider]  metoprolol tartrate (LOPRESSOR) 25 MG tablet Take 0.5 tablets (12.5 mg total) by mouth 2 (two) times daily. 06/10/17  Yes Regalado, Belkys A, MD  NIFEdipine (PROCARDIA XL/ADALAT-CC) 60 MG 24 hr tablet Take 60 mg by mouth 2 (two) times daily. 12/03/15  Yes [provider]  Nutritional Supplements (FEEDING SUPPLEMENT, NEPRO CARB STEADY,) LIQD Take 237 mLs by mouth 2 (two) times daily between meals. 06/09/17  Yes Regalado, Belkys A, MD  polyethylene glycol (MIRALAX / GLYCOLAX) packet Take 17 g by mouth daily. 10/25/17  Yes Zannie CoveJoseph, Preetha, MD  umeclidinium bromide (INCRUSE ELLIPTA) 62.5 MCG/INH AEPB Inhale 1 puff into the lungs daily.   Yes [provider]    Current Facility-Administered Medications  Medication Dose Route Frequency Provider Last Rate Last Dose  . 0.9 %  sodium chloride infusion   Intravenous Continuous Lorretta HarpNiu, Xilin, MD 10 mL/hr at 11/02/17 2322    . acetaminophen (TYLENOL) tablet 650 mg  650 mg Oral Q6H PRN Lorretta HarpNiu, Xilin, MD   650 mg at 11/03/17 0015   Or  .  acetaminophen (TYLENOL) suppository 650 mg  650 mg Rectal Q6H PRN Lorretta HarpNiu, Xilin, MD      . albuterol (PROVENTIL) (2.5 MG/3ML) 0.083% nebulizer solution 2.5 mg  2.5 mg Nebulization Q4H PRN Lorretta HarpNiu, Xilin, MD      . atorvastatin (LIPITOR) tablet 20 mg  20 mg Oral QPM Lorretta HarpNiu, Xilin, MD   20 mg at 11/03/17 0021  . [START ON 11/04/2017] doxercalciferol (HECTOROL) capsule 2.5 mcg  2.5 mcg Oral Q T,Th,Sa-HD Lorretta HarpNiu, Xilin, MD      . feeding supplement (NEPRO CARB STEADY) liquid 237 mL  237 mL Oral BID BM Lorretta HarpNiu, Xilin, MD      . fluticasone furoate-vilanterol (BREO ELLIPTA) 100-25 MCG/INH 1 puff  1 puff Inhalation Daily Lorretta HarpNiu, Xilin, MD      . gabapentin (NEURONTIN) capsule 300 mg  300 mg Oral QHS Lorretta Harp, MD   300 mg at 11/03/17 0017  . hydrALAZINE (APRESOLINE) injection 5 mg  5 mg Intravenous Q2H PRN Lorretta Harp, MD      . HYDROmorphone (DILAUDID) tablet 1 mg  1 mg Oral Q4H PRN Lorretta Harp, MD      . isosorbide mononitrate (IMDUR) 24 hr tablet 30 mg  30 mg Oral QPM Lorretta Harp, MD   30 mg at 11/03/17 0014  . lidocaine-prilocaine (EMLA) cream 1 application  1 application Topical Daily Lorretta Harp, MD      . Melatonin TABS 3 mg  3 mg Oral QHS Lorretta Harp, MD   3 mg at 11/03/17 0015  . metoprolol tartrate (LOPRESSOR) tablet 12.5 mg  12.5 mg Oral BID Lorretta Harp, MD   12.5 mg at 11/03/17 0016  . NIFEdipine (PROCARDIA XL/ADALAT-CC) 24 hr tablet 60 mg  60 mg Oral BID Lorretta Harp, MD   60 mg at 11/03/17 0014  . ondansetron (ZOFRAN) tablet 4 mg  4 mg Oral Q6H PRN Lorretta Harp, MD       Or  . ondansetron [facility]) injection 4 mg  4 mg Intravenous Q6H PRN Lorretta Harp, MD      . pantoprazole (PROTONIX) 80 mg in sodium chloride 0.9 % 250 mL (0.32 mg/mL) infusion  8 mg/hr Intravenous Continuous Lorretta Harp, MD 25 mL/hr at 11/03/17 0935 8 mg/hr at 11/03/17 0935  . [START ON 11/06/2017] pantoprazole (PROTONIX) injection 40 mg  40 mg Intravenous Q12H Lorretta Harp, MD      . polyethylene glycol (MIRALAX / GLYCOLAX) packet 17 g  17 g Oral Daily PRN  Lorretta Harp, MD      . umeclidinium bromide (INCRUSE ELLIPTA) 62.5 MCG/INH 1 puff  1 puff Inhalation Daily Lorretta Harp, MD      . zolpidem (AMBIEN) tablet 5 mg  5 mg Oral QHS PRN Lorretta Harp, MD   5 mg at 11/03/17 0015    Allergies as of 11/02/2017  . (No Known Allergies)     Review of Systems:    Unable to complete as patient was severely lethargic and kept falling asleep.   Physical Exam:  Vital signs in last 24 hours: Temp:  [98.3 F (36.8 C)-99.3 F (37.4 C)] 98.3 F (36.8 C) (08/21 0543) Pulse Rate:  [62-102] 62 (08/21 0543) Resp:  [16-20] 18 (08/21 0543) BP: (121-156)/(47-101) 131/51 (08/21 0543) SpO2:  [94 %-100 %] 100 % (08/21 0543) Weight:  [55 kg-56 kg] 55 kg (08/21 0543) Last BM Date: 11/02/17 General:   AA female appears to be in NAD, Well developed, Well nourished, asleep/lethargic Head:  Normocephalic and atraumatic. Eyes:   PEERL, EOMI. No icterus. Conjunctiva pink. Ears:  Normal auditory acuity. Neck:  Supple Throat: Oral cavity and pharynx without inflammation, swelling or lesion. Poor dentition Lungs: Respirations even and unlabored. Lungs clear to auscultation bilaterally.   No wheezes, crackles, or rhonchi.  Heart: Normal S1, S2. No MRG. Regular rate and rhythm. No peripheral edema, cyanosis or pallor.  Abdomen:  Soft, nondistended, some grimacing with deep palpation of epigastrum, No rebound or guarding. Normal bowel sounds. No appreciable masses or hepatomegaly. Rectal:  Not performed.  Msk:  Symmetrical without gross deformities. Peripheral pulses intact.  Extremities:  Without edema, no deformity or joint abnormality.  Neurologic: lethargic and falling asleep Skin:   Gangrenous changes in both toes Psychiatric: asleep   LAB RESULTS: Recent Labs    11/02/17 1812 11/02/17 2330 11/03/17 0731  WBC 13.8*  13.5* 11.0*  HGB 10.4* 8.7* 9.7*  HCT 36.1 30.9* 33.6*  PLT 315 321 334   BMET Recent Labs    11/02/17 1812 11/03/17 0731  NA 138 138  K 5.0  5.0  CL 100 101  CO2 27 25  GLUCOSE 119* 77  BUN 14 17  CREATININE 3.86* 4.43*  CALCIUM 8.9 8.7*   LFT Recent Labs    11/02/17 1812  PROT 7.8  ALBUMIN 2.5*  AST 51*  ALT 27  ALKPHOS 118  BILITOT 0.4   PT/INR Recent Labs    11/02/17 2330  LABPROT 13.6  INR 1.05    STUDIES: Dg Chest Port 1 View  Result Date: 11/02/2017 CLINICAL DATA:  Leukocytosis. EXAM: PORTABLE CHEST 1 VIEW COMPARISON:  Single-view of the chest 10/16/2017, 10/15/2017 and 12/15/2015. FINDINGS: Calcified bilateral pleural plaques are again seen. Chronic coarsening of the pulmonary interstitium bilaterally is also identified and not notably changed since the most recent comparison exams. There is cardiomegaly. Aortic atherosclerosis is noted. No pneumothorax or pleural fluid. IMPRESSION: Findings most compatible with chronic interstitial lung disease with calcified pleural plaques identified. Cardiomegaly. Atherosclerosis. Electronically Signed   By: Drusilla Kanner M.D.   On: 11/02/2017 20:44    Impression / Plan:   Impression: 1.  Hematemesis: Hemoglobin 10.4-->8.7-->9.7, currently no symptoms, one episode of 60 cc of a dark material during hemodialysis 11/02/2017, none since, no BM since being admitted 2.  CAD 3.  COPD 4.  ESRD on HD 5.  A. fib : not on anticoagulation  Plan: 1.  Continue Protonix drip 2.  Continue Zofran as needed for nausea 3.  Continue to monitor hemoglobin and transfusion as needed less than 7 4.  Spoke with patient's daughter and POA and discussed EGD.  Discussed risk, benefits, limitations and alternatives and she agrees to proceed.  This will be scheduled for tomorrow.  Patient will be on clear liquids today and n.p.o. after midnight. 5. Please await any further recommendations from Dr. Meridee Score later today  Thank you for your kind consultation, we will continue to follow.  Violet Baldy [facility]  11/03/2017, 10:46 AM

## 2017-11-04 ENCOUNTER — Inpatient Hospital Stay (HOSPITAL_COMMUNITY): Payer: Medicare HMO

## 2017-11-04 ENCOUNTER — Encounter (HOSPITAL_COMMUNITY): Payer: Self-pay

## 2017-11-04 ENCOUNTER — Telehealth: Payer: Self-pay

## 2017-11-04 ENCOUNTER — Encounter (HOSPITAL_COMMUNITY)
Admission: EM | Disposition: A | Discharge status: Skilled Nursing Facility | Payer: Self-pay | Source: Home / Self Care | Attending: Emergency Medicine

## 2017-11-04 DIAGNOSIS — K253 Acute gastric ulcer without hemorrhage or perforation: Secondary | ICD-10-CM

## 2017-11-04 DIAGNOSIS — K259 Gastric ulcer, unspecified as acute or chronic, without hemorrhage or perforation: Secondary | ICD-10-CM | POA: Diagnosis not present

## 2017-11-04 DIAGNOSIS — J449 Chronic obstructive pulmonary disease, unspecified: Secondary | ICD-10-CM | POA: Diagnosis not present

## 2017-11-04 DIAGNOSIS — K228 Other specified diseases of esophagus: Secondary | ICD-10-CM | POA: Diagnosis not present

## 2017-11-04 DIAGNOSIS — I482 Chronic atrial fibrillation: Secondary | ICD-10-CM | POA: Diagnosis not present

## 2017-11-04 DIAGNOSIS — K920 Hematemesis: Secondary | ICD-10-CM | POA: Diagnosis not present

## 2017-11-04 DIAGNOSIS — K319 Disease of stomach and duodenum, unspecified: Secondary | ICD-10-CM | POA: Diagnosis not present

## 2017-11-04 DIAGNOSIS — K3189 Other diseases of stomach and duodenum: Secondary | ICD-10-CM | POA: Diagnosis not present

## 2017-11-04 DIAGNOSIS — E1121 Type 2 diabetes mellitus with diabetic nephropathy: Secondary | ICD-10-CM | POA: Diagnosis not present

## 2017-11-04 HISTORY — PX: BIOPSY: SHX5522

## 2017-11-04 HISTORY — PX: BRONCHIAL BRUSHINGS: SHX5108

## 2017-11-04 HISTORY — PX: ESOPHAGOGASTRODUODENOSCOPY (EGD) WITH PROPOFOL: SHX5813

## 2017-11-04 LAB — CBC WITH DIFFERENTIAL/PLATELET
ABS IMMATURE GRANULOCYTES: 0 10*3/uL (ref 0.0–0.1)
BASOS ABS: 0 10*3/uL (ref 0.0–0.1)
Basophils Relative: 0 %
Eosinophils Absolute: 0.5 10*3/uL (ref 0.0–0.7)
Eosinophils Relative: 6 %
HCT: 28.4 % — ABNORMAL LOW (ref 36.0–46.0)
HEMOGLOBIN: 8.2 g/dL — AB (ref 12.0–15.0)
Immature Granulocytes: 0 %
LYMPHS PCT: 22 %
Lymphs Abs: 1.7 10*3/uL (ref 0.7–4.0)
MCH: 25.9 pg — AB (ref 26.0–34.0)
MCHC: 28.9 g/dL — ABNORMAL LOW (ref 30.0–36.0)
MCV: 89.9 fL (ref 78.0–100.0)
MONO ABS: 0.9 10*3/uL (ref 0.1–1.0)
Monocytes Relative: 11 %
NEUTROS ABS: 4.9 10*3/uL (ref 1.7–7.7)
Neutrophils Relative %: 61 %
Platelets: 307 10*3/uL (ref 150–400)
RBC: 3.16 MIL/uL — AB (ref 3.87–5.11)
RDW: 19.7 % — ABNORMAL HIGH (ref 11.5–15.5)
WBC: 8 10*3/uL (ref 4.0–10.5)

## 2017-11-04 LAB — PROCALCITONIN: PROCALCITONIN: 0.21 ng/mL

## 2017-11-04 LAB — BASIC METABOLIC PANEL
Anion gap: 9 (ref 5–15)
BUN: 22 mg/dL (ref 8–23)
CHLORIDE: 103 mmol/L (ref 98–111)
CO2: 23 mmol/L (ref 22–32)
Calcium: 7.9 mg/dL — ABNORMAL LOW (ref 8.9–10.3)
Creatinine, Ser: 5.02 mg/dL — ABNORMAL HIGH (ref 0.44–1.00)
GFR calc non Af Amer: 7 mL/min — ABNORMAL LOW (ref >60)
GFR, EST AFRICAN AMERICAN: 8 mL/min — AB (ref >60)
Glucose, Bld: 58 mg/dL — ABNORMAL LOW (ref 70–99)
POTASSIUM: 4.9 mmol/L (ref 3.5–5.1)
Sodium: 135 mmol/L (ref 135–145)

## 2017-11-04 LAB — GLUCOSE, CAPILLARY: Glucose-Capillary: 89 mg/dL (ref 70–99)

## 2017-11-04 SURGERY — ESOPHAGOGASTRODUODENOSCOPY (EGD) WITH PROPOFOL
Anesthesia: Monitor Anesthesia Care

## 2017-11-04 MED ORDER — LIDOCAINE HCL (CARDIAC) PF 100 MG/5ML IV SOSY
PREFILLED_SYRINGE | INTRAVENOUS | Status: DC | PRN
  Administered 2017-11-04: 40 mg via INTRAVENOUS

## 2017-11-04 MED ORDER — LIDOCAINE-PRILOCAINE 2.5-2.5 % EX CREA: 1.0000 "application " | TOPICAL_CREAM | CUTANEOUS | Status: DC | PRN

## 2017-11-04 MED ORDER — HYDROMORPHONE HCL 1 MG/ML IJ SOLN
0.5000 mg | Freq: Once | INTRAMUSCULAR | Status: AC
  Administered 2017-11-04: 0.5 mg via INTRAVENOUS
  Filled 2017-11-04: qty 0.5

## 2017-11-04 MED ORDER — LIDOCAINE HCL (PF) 1 % IJ SOLN: 5.0000 mL | INTRAMUSCULAR | Status: DC | PRN

## 2017-11-04 MED ORDER — DARBEPOETIN ALFA 150 MCG/0.3ML IJ SOSY
150.0000 ug | PREFILLED_SYRINGE | INTRAMUSCULAR | Status: DC
  Administered 2017-11-04: 150 ug via INTRAVENOUS
  Filled 2017-11-04: qty 0.3

## 2017-11-04 MED ORDER — PROPOFOL 10 MG/ML IV BOLUS
INTRAVENOUS | Status: DC | PRN
  Administered 2017-11-04: 30 mg via INTRAVENOUS

## 2017-11-04 MED ORDER — SODIUM CHLORIDE 0.9 % IV SOLN: 100.0000 mL | INTRAVENOUS | Status: DC | PRN

## 2017-11-04 MED ORDER — HEPARIN SODIUM (PORCINE) 1000 UNIT/ML DIALYSIS: 1000.0000 [IU] | INTRAMUSCULAR | Status: DC | PRN

## 2017-11-04 MED ORDER — ACETAMINOPHEN 325 MG PO TABS
ORAL_TABLET | ORAL | Status: AC
  Filled 2017-11-04: qty 2

## 2017-11-04 MED ORDER — PENTAFLUOROPROP-TETRAFLUOROETH EX AERO: 1.0000 "application " | INHALATION_SPRAY | CUTANEOUS | Status: DC | PRN

## 2017-11-04 MED ORDER — DARBEPOETIN ALFA 150 MCG/0.3ML IJ SOSY
PREFILLED_SYRINGE | INTRAMUSCULAR | Status: AC
  Administered 2017-11-04: 150 ug via INTRAVENOUS
  Filled 2017-11-04: qty 0.3

## 2017-11-04 MED ORDER — PROPOFOL 500 MG/50ML IV EMUL
INTRAVENOUS | Status: DC | PRN
  Administered 2017-11-04: 75 ug/kg/min via INTRAVENOUS

## 2017-11-04 MED ORDER — PANTOPRAZOLE SODIUM 40 MG IV SOLR
40.0000 mg | Freq: Two times a day (BID) | INTRAVENOUS | Status: DC
  Administered 2017-11-04 – 2017-11-05 (×2): 40 mg via INTRAVENOUS
  Filled 2017-11-04 (×4): qty 40

## 2017-11-04 SURGICAL SUPPLY — 14 items

## 2017-11-04 NOTE — Progress Notes (Signed)
Code 44, patient is in Dialysis, will complete paperwork when patient returns to the room. Abelino DerrickB Mical Kicklighter [facility] Surgicare LtdRN,MHA,BSN [phone]

## 2017-11-04 NOTE — Progress Notes (Signed)
PROGRESS NOTE  Nicole Cox QIO:[id number] DOB: [birth year] DOA: 11/02/2017 PCP: Joana Reamer, MD  HPI/Recap of past 24 hours: Nicole Cox is a 84 y.o. female with medical history significant of hypertension, hyperlipidemia, diet-controlled diabetes, COPD, stroke, CHF, CAD, atrial fibrillation not on anticoagulants, ESRD-HD (TTS), PAD with bilateral gangrene feet, dysphagia, who presents with hematemesis. Per report, pt had nausea and vomited approximately 60 cc of dark material while she was receiving dialysis on 8/20. Of note, patient was recently hospitalized from 8/2-8/14 due to aspiration pneumonia, acute metabolic encephalopathy and PAD with gangrenous changes in both toes. Pt admitted for further management.   Today, pt noted to be more awake. Pt was taken to endoscopy suite for EGD when she c/o ??chest pain. Upon evaluation, pt was noted to be TTP around the epigastric area. EKG done showed NSR. Pt c/o chronic toe pain. Denies any other new complaints.  Assessment/Plan: Principal Problem:   Hematemesis Active Problems:   Hypertension   DM (diabetes mellitus), type 2 with renal complications (HCC)   COPD (chronic obstructive pulmonary disease) (HCC)   CAD (coronary artery disease)   ESRD on dialysis (HCC)   Leukocytosis   Atrial fibrillation, chronic (HCC)   PAD (peripheral artery disease) (HCC)   Gangrene of toe of both feet (HCC)   GI bleed   Acute gastric ulcer without hemorrhage or perforation  Hematemesis/Gastric ulcer/erosive gastropathy No further episode noted Hemoglobin dropped to 8.9, from 10/26/17-->10.4 Hemodynamically stable No record of previous EGD or colonoscopy on file GI consulted: EGD done on 11/04/17 showed: - No gross lesions in esophagus. - White nummular lesions in esophageal mucosa. Cells for cytology obtained. - Erythematous mucosa in the gastric body and antrum and associated non-bleeding   erosive gastropathy. - Non-bleeding gastric ulcer  with a clean ulcer base (Forrest Class III). - A single submucosal papule (nodule) found in the stomach. Tunnel biopsied. GI recommend follow up in clinic, repeat endoscopy in 3 months and possible add on EUS. OK for ASA. PPI 40 mg BID for 3 months Continue PPI Monitor closely, transfuse if Hgb<7.0  Acute metabolic encephalopathy Improving ?? Opoid induced Vs infection Afebrile, resolved leukocytosis BC X 2 NGTD Procalcitonin 0.22-->0.21 CXR no acute abnormality Has a fentanyl patch on, held dilaudid and gabapentin for now Monitor closely   HTN Continue home medications: Metoprolol, nifedipine IV hydralazine prn  Diet controled DM type 2 A1c 4.6 CBG Qam  CAD No chest pain. Continue aspirin, Lipitor, imdur, metoprolol  COPD Stable Continue bronchodilators.  ESRD on dialysis (TTS) Nephrology for HD  Atrial Fibrillation Rate controlled CHA2DS2-VASc Score is 9, not on chronic AC Continue metoprolol  PAD and gangrene of toe of both feet (HCC): per discharge summary from recently previous admission, "she underwent aortogram 8/9-diffuse calcific atherosclerosis, occluded posterior tibials bilaterally, failed attempt at right tibial cannulation. Per vascular surgeons, no endovascular options for revascularization and she is not a candidate for open vascular bypass, only option would be amputation for progressive gangrenous changes". Current plan is to continue narcotics, palliative measures. Continue home fentanyl patch, held Dilaudid, gabapentin for now due to AMS    Code Status: DNR  Family Communication: None at bedside   Disposition Plan: Once work up is complete, likely on 11/05/17    Consultants:  GI   Nephrology  Procedures:  EGD on 11/04/17  Antimicrobials:  None  DVT prophylaxis:  SCDs   Objective: Vitals:   11/04/17 1505 11/04/17 1515 11/04/17 1520 11/04/17 1530  BP: (!) 162/69 Marland Kitchen)  161/65 (!) 163/67 (!) 176/73  Pulse: 96 94 94 94    Resp: 17 20 19    Temp: 98.1 F (36.7 C)     TempSrc: Oral     SpO2: 91%     Weight: 58.8 kg     Height:        Intake/Output Summary (Last 24 hours) at 11/04/2017 1540 Last data filed at 11/04/2017 1501 Gross per 24 hour  Intake 1730.62 ml  Output 0 ml  Net 1730.62 ml   Filed Weights   11/03/17 0543 11/04/17 0630 11/04/17 1505  Weight: 55 kg 58.6 kg 58.8 kg    Exam:   General: Awake, alert  Cardiovascular: S1, S2 present  Respiratory: CTAB   Abdomen: Soft, NT, ND, BS present  Musculoskeletal: No pedal edema b/l   Skin: Gangrenous changes in both toes  Psychiatry: Normal mood   Data Reviewed: CBC: Recent Labs  Lab 11/02/17 1812 11/02/17 2330 11/03/17 0731 11/03/17 1435 11/04/17 0435  WBC 13.8* 13.5* 11.0* 9.3 8.0  NEUTROABS 10.6*  --   --   --  4.9  HGB 10.4* 8.7* 9.7* 8.9* 8.2*  HCT 36.1 30.9* 33.6* 31.2* 28.4*  MCV 90.9 92.5 91.3 91.5 89.9  PLT 315 321 334 324 307   Basic Metabolic Panel: Recent Labs  Lab 11/02/17 1812 11/03/17 0731 11/04/17 0435  NA 138 138 135  K 5.0 5.0 4.9  CL 100 101 103  CO2 27 25 23   GLUCOSE 119* 77 58*  BUN 14 17 22   CREATININE 3.86* 4.43* 5.02*  CALCIUM 8.9 8.7* 7.9*   GFR: Estimated Creatinine Clearance: 6.7 mL/min (A) (by C-G formula based on SCr of 5.02 mg/dL (H)). Liver Function Tests: Recent Labs  Lab 11/02/17 1812  AST 51*  ALT 27  ALKPHOS 118  BILITOT 0.4  PROT 7.8  ALBUMIN 2.5*   No results for input(s): LIPASE, AMYLASE in the last 168 hours. No results for input(s): AMMONIA in the last 168 hours. Coagulation Profile: Recent Labs  Lab 11/02/17 2330  INR 1.05   Cardiac Enzymes: No results for input(s): CKTOTAL, CKMB, CKMBINDEX, TROPONINI in the last 168 hours. BNP (last 3 results) No results for input(s): PROBNP in the last 8760 hours. HbA1C: Recent Labs    11/03/17 1832  HGBA1C 4.6*   CBG: No results for input(s): GLUCAP in the last 168 hours. Lipid Profile: No results for  input(s): CHOL, HDL, LDLCALC, TRIG, CHOLHDL, LDLDIRECT in the last 72 hours. Thyroid Function Tests: No results for input(s): TSH, T4TOTAL, FREET4, T3FREE, THYROIDAB in the last 72 hours. Anemia Panel: No results for input(s): VITAMINB12, FOLATE, FERRITIN, TIBC, IRON, RETICCTPCT in the last 72 hours. Urine analysis:    Component Value Date/Time   COLORURINE YELLOW 08/11/2012 1917   APPEARANCEUR CLOUDY (A) 08/11/2012 1917   LABSPEC 1.013 08/11/2012 1917   PHURINE 7.5 08/11/2012 1917   GLUCOSEU 100 (A) 08/11/2012 1917   HGBUR TRACE (A) 08/11/2012 1917   BILIRUBINUR NEGATIVE 08/11/2012 1917   KETONESUR NEGATIVE 08/11/2012 1917   PROTEINUR >300 (A) 08/11/2012 1917   UROBILINOGEN 0.2 08/11/2012 1917   NITRITE NEGATIVE 08/11/2012 1917   LEUKOCYTESUR NEGATIVE 08/11/2012 1917   Sepsis Labs: @LABRCNTIP (procalcitonin:4,lacticidven:4)  ) Recent Results (from the past 240 hour(s))  Culture, blood (Routine X 2) w Reflex to ID Panel     Status: None (Preliminary result)   Collection Time: 11/02/17 11:45 PM  Result Value Ref Range Status   Specimen Description BLOOD RIGHT HAND  Final   Special  Requests   Final    BOTTLES DRAWN AEROBIC ONLY Blood Culture results may not be optimal due to an inadequate volume of blood received in culture bottles   Culture   Final    NO GROWTH 1 DAY Performed at [facility], [address] [place], [place] [address]    Report Status PENDING  Incomplete  Culture, blood (Routine X 2) w Reflex to ID Panel     Status: None (Preliminary result)   Collection Time: 11/02/17 11:50 PM  Result Value Ref Range Status   Specimen Description BLOOD RIGHT ARM  Final   Special Requests   Final    BOTTLES DRAWN AEROBIC ONLY Blood Culture adequate volume   Culture   Final    NO GROWTH 1 DAY Performed at [facility], [address] [place], [place] [address]    Report Status PENDING  Incomplete      Studies: No results found.  Scheduled Meds: .  atorvastatin  20 mg Oral QPM  . Chlorhexidine Gluconate Cloth  6 each Topical Q0600  . darbepoetin (ARANESP) injection - DIALYSIS  150 mcg Intravenous Q Thu-HD  . doxercalciferol  2.5 mcg Oral Q T,Th,Sa-HD  . feeding supplement (NEPRO CARB STEADY)  237 mL Oral BID BM  . fluticasone furoate-vilanterol  1 puff Inhalation Daily  . isosorbide mononitrate  30 mg Oral QPM  . lidocaine-prilocaine  1 application Topical Daily  . Melatonin  3 mg Oral QHS  . metoprolol tartrate  12.5 mg Oral BID  . NIFEdipine  60 mg Oral BID  . [START ON 11/06/2017] pantoprazole  40 mg Intravenous Q12H  . umeclidinium bromide  1 puff Inhalation Daily    Continuous Infusions: . sodium chloride 10 mL/hr at 11/02/17 2322  . sodium chloride    . sodium chloride    . pantoprozole (PROTONIX) infusion 6.4 mg/hr (11/03/17 2200)     LOS: 1 day     Briant Cedar, MD [facility]   If 7PM-7AM, please contact night-coverage [email or web address] Password [facility] 11/04/2017, 3:40 PM

## 2017-11-04 NOTE — Transfer of Care (Signed)
Immediate Anesthesia Transfer of Care Note  Patient: Nicole Cox  Procedure(s) Performed: ESOPHAGOGASTRODUODENOSCOPY (EGD) WITH PROPOFOL (N/A ) BIOPSY  Patient Location: PACU and Endoscopy Unit  Anesthesia Type:MAC  Level of Consciousness: awake, alert  and patient cooperative  Airway & Oxygen Therapy: Patient Spontanous Breathing and Patient connected to nasal cannula oxygen  Post-op Assessment: Report given to RN, Post -op Vital signs reviewed and stable and Patient moving all extremities X 4  Post vital signs: Reviewed and stable  Last Vitals:  Vitals Value Taken Time  BP 129/37 11/04/2017 11:41 AM  Temp    Pulse 78 11/04/2017 11:41 AM  Resp 20 11/04/2017 11:41 AM  SpO2 97 % 11/04/2017 11:41 AM  Vitals shown include unvalidated device data.  Last Pain:  Vitals:   11/04/17 0705  TempSrc: Oral      Patients Stated Pain Goal: 2 (09/13/14 0109)  Complications: No apparent anesthesia complications

## 2017-11-04 NOTE — Progress Notes (Signed)
Patient is complaining of new onset chest discomfort. Anesthesia and GI MD notified. [facility] hospitalist Dr. Sharolyn DouglasEzenduka was notified and she evaluated patient. 12 lead ordered and obtained. EKG shows NSR. Per Dr. Sharolyn DouglasEzenduka, we can proceed with the EGD this morning.

## 2017-11-04 NOTE — Anesthesia Procedure Notes (Signed)
Procedure Name: MAC Date/Time: 11/04/2017 11:09 AM Performed by: Mariea Clonts, CRNA Pre-anesthesia Checklist: Patient identified, Emergency Drugs available, Patient being monitored, Suction available and Timeout performed Patient Re-evaluated:Patient Re-evaluated prior to induction Oxygen Delivery Method: Nasal cannula Induction Type: IV induction

## 2017-11-04 NOTE — Op Note (Signed)
[facility] Patient Name: Nicole Cox Procedure Date : 11/04/2017 MRN: [id number] Attending MD: Justice Britain , MD Date of Birth: July 02, 1931 CSN: [id number] Age: 84 Admit Type: Inpatient Procedure:                Upper GI endoscopy Indications:              Acute post hemorrhagic anemia, Coffee-ground                            emesis, Hematemesis Providers:                Justice Britain, MD, Carolynn Comment RN, RN,                            William Dalton, Technician Referring MD:             Azucena Freed PA, PA, Dr. Sharyon Medicus (PCP), Dr.                            Horris Latino ([facility]) Medicines:                Monitored Anesthesia Care Complications:            No immediate complications. Estimated Blood Loss:     Estimated blood loss was minimal. Procedure:                Pre-Anesthesia Assessment:                           - Prior to the procedure, a History and Physical                            was performed, and patient medications and                            allergies were reviewed. The patient's tolerance of                            previous anesthesia was also reviewed. The risks                            and benefits of the procedure and the sedation                            options and risks were discussed with the patient.                            All questions were answered, and informed consent                            was obtained. Prior Anticoagulants: The patient has                            taken aspirin. ASA Grade Assessment: III - A  patient with severe systemic disease. After                            reviewing the risks and benefits, the patient was                            deemed in satisfactory condition to undergo the                            procedure.                           After obtaining informed consent, the endoscope was                            passed under direct vision.  Throughout the                            procedure, the patient's blood pressure, pulse, and                            oxygen saturations were monitored continuously. The                            [id number]-H190 ([id number]) Olympus Adult EGD was introduced                            through the mouth, and advanced to the second part                            of duodenum. The upper GI endoscopy was                            accomplished without difficulty. The patient                            tolerated the procedure. Scope In: Scope Out: Findings:      No gross lesions were noted in the entire esophagus.      White nummular lesions were noted in the entire esophagus. Cells for       cytology were obtained by brushing.      Patchy moderately erythematous mucosa without bleeding was found in the       gastric body and in the gastric antrum.      A few dispersed, small non-bleeding erosions were found in the gastric       body.      One non-bleeding superficial gastric ulcer with a clean ulcer base       (Forrest Class III) was found on the lesser curvature of the gastric       antrum. The lesion was 8 mm in largest dimension.      A single 8 mm submucosal papule (nodule) with no bleeding and no       stigmata of recent bleeding was found on the greater curvature of the       gastric body. There was not a pillow sign present. Tunneled biopsies  were taken with a cold forceps for histology.      No other gross lesions were noted in the entire examined stomach.       Biopsies were taken with a cold forceps for histology and Helicobacter       pylori testing.      The duodenal bulb, first portion of the duodenum and second portion of       the duodenum were normal. Impression:               - No gross lesions in esophagus.                           - White nummular lesions in esophageal mucosa.                            Cells for cytology obtained.                           -  Erythematous mucosa in the gastric body and                            antrum and associated Non-bleeding erosive                            gastropathy.                           - Non-bleeding gastric ulcer with a clean ulcer                            base (Forrest Class III).                           - A single submucosal papule (nodule) found in the                            stomach. Tunnel biopsied.                           - No other gross lesions in the stomach. Biopsied                            stomach to rule out HP.                           - Normal duodenal bulb, first portion of the                            duodenum and second portion of the duodenum. Recommendation:           - The patient will be observed post-procedure,                            until all discharge criteria are met.                           - Return  patient to hospital ward for ongoing care.                           - Await pathology results.                           - See back in GI clinic for follow up.                           - Repeat upper endoscopy in 3 months for                            surveillance and possible add-on EUS to evaluate                            the region.                           - Minimize NSAIDs as able, OK for aspirin to                            continue.                           - PPI 40 mg twice daily for next 87-month and after                            clinic visit and repeat EGD will be able to discuss                            decreasing dose if able.                           - The findings and recommendations were discussed                            with the patient.                           - The findings and recommendations were discussed                            with the patient's family.                           - The findings and recommendations were discussed                            with the referring physician. Procedure Code(s):         --- Professional ---                           [phone] Esophagogastroduodenoscopy, flexible,                            transoral; with biopsy,  single or multiple Diagnosis Code(s):        --- Professional ---                           K22.8, Other specified diseases of esophagus                           K31.89, Other diseases of stomach and duodenum                           K25.9, Gastric ulcer, unspecified as acute or                            chronic, without hemorrhage or perforation                           D62, Acute posthemorrhagic anemia                           K92.0, Hematemesis CPT copyright 2017 American Medical Association. All rights reserved. The codes documented in this report are preliminary and upon coder review may  be revised to meet current compliance requirements. Justice Britain, MD 11/04/2017 2:37:02 PM Number of Addenda: 0

## 2017-11-04 NOTE — Telephone Encounter (Signed)
-----   Message from Lemar LoftyGabriel Mansouraty Jr., MD sent at 11/04/2017  2:37 PM EDT ----- Regarding: Patient for follow up Dear Alexia FreestonePatty, Please schedule this patient a clinic visit with myself in 2-2.5 months. She should come with one of her daugthers on a non-HD day if possible. Please also place a 1175-month F/U EGD with EUS request. Thanks.  Liz BeachGabe

## 2017-11-04 NOTE — Telephone Encounter (Signed)
Pt has a f/u with Dr Judie PetitM on 01/10/18 1:30 pm Left message on machine to call back

## 2017-11-04 NOTE — Plan of Care (Signed)

## 2017-11-04 NOTE — Progress Notes (Signed)
Subjective:  Seen in endoscopy pre op- c/o toe pain and being hungry- hgb down from 8.9 to 8.2 Objective Vital signs in last 24 hours: Vitals:   11/03/17 1155 11/03/17 2134 11/04/17 0630 11/04/17 0705  BP: (!) 123/46 (!) 135/49 (!) 133/50 (!) 138/37  Pulse: 66 71  65  Resp: 18 18 18 17   Temp: 98.2 F (36.8 C) 98.5 F (36.9 C) 98.4 F (36.9 C) 99 F (37.2 C)  TempSrc:  Oral Oral Oral  SpO2: 100% 98% 98% 93%  Weight:   58.6 kg   Height:       Weight change: 2.6 kg  Intake/Output Summary (Last 24 hours) at 11/04/2017 1110 Last data filed at 11/04/2017 0600 Gross per 24 hour  Intake 1270.62 ml  Output -  Net 1270.62 ml    Dialysis Orders: [facility]/ Dr Louis Meckel TTS 3h 121lbs 2/2.5 Hep 2000 then 250 / hr AVG  - mircera150 q 2 weeks-  -hect 1 mcg IV q tx  Assessment/ Plan: Pt is a 84 y.o. yo female ESRD, PAD without revasc options- frequent hospitalizations who was admitted on 11/02/2017 with hematemesis  Assessment/Plan: 1. hemetamesis- per GI- EGD today - hgb trending down  2. ESRD - normally TTS at [facility] via AVG- will be done later today , no heparin 3. Anemia- GI blood loss and CKD- doubt was back at center long enough to get ESA- will dose here 4. Secondary hyperparathyroidism- cont hectorol- no binder on med list 5. HTN/volume- reasonable at this time- metoprolol/procardia continued 6. Dispo- chronically ill- PAD that is not fixable- Palliative care has been involved- is DNR but wants to continue dialysis for now   Sneha Willig A    Labs: Basic Metabolic Panel: Recent Labs  Lab 11/02/17 1812 11/03/17 0731 11/04/17 0435  NA 138 138 135  K 5.0 5.0 4.9  CL 100 101 103  CO2 27 25 23   GLUCOSE 119* 77 58*  BUN 14 17 22   CREATININE 3.86* 4.43* 5.02*  CALCIUM 8.9 8.7* 7.9*   Liver Function Tests: Recent Labs  Lab 11/02/17 1812  AST 51*  ALT 27  ALKPHOS 118  BILITOT 0.4  PROT 7.8  ALBUMIN 2.5*   No results for input(s): LIPASE, AMYLASE  in the last 168 hours. No results for input(s): AMMONIA in the last 168 hours. CBC: Recent Labs  Lab 11/02/17 1812 11/02/17 2330 11/03/17 0731 11/03/17 1435 11/04/17 0435  WBC 13.8* 13.5* 11.0* 9.3 8.0  NEUTROABS 10.6*  --   --   --  4.9  HGB 10.4* 8.7* 9.7* 8.9* 8.2*  HCT 36.1 30.9* 33.6* 31.2* 28.4*  MCV 90.9 92.5 91.3 91.5 89.9  PLT 315 321 334 324 307   Cardiac Enzymes: No results for input(s): CKTOTAL, CKMB, CKMBINDEX, TROPONINI in the last 168 hours. CBG: No results for input(s): GLUCAP in the last 168 hours.  Iron Studies: No results for input(s): IRON, TIBC, TRANSFERRIN, FERRITIN in the last 72 hours. Studies/Results: Dg Chest Port 1 View  Result Date: 11/02/2017 CLINICAL DATA:  Leukocytosis. EXAM: PORTABLE CHEST 1 VIEW COMPARISON:  Single-view of the chest 10/16/2017, 10/15/2017 and 12/15/2015. FINDINGS: Calcified bilateral pleural plaques are again seen. Chronic coarsening of the pulmonary interstitium bilaterally is also identified and not notably changed since the most recent comparison exams. There is cardiomegaly. Aortic atherosclerosis is noted. No pneumothorax or pleural fluid. IMPRESSION: Findings most compatible with chronic interstitial lung disease with calcified pleural plaques identified. Cardiomegaly. Atherosclerosis. Electronically Signed   By: Drusilla Kanner  M.D.   On: 11/02/2017 20:44   Medications: Infusions: . sodium chloride 10 mL/hr at 11/02/17 2322  . sodium chloride 500 mL (11/04/17 0710)  . pantoprozole (PROTONIX) infusion 6.4 mg/hr (11/03/17 2200)    Scheduled Medications: . [MAR Hold] atorvastatin  20 mg Oral QPM  . [MAR Hold] Chlorhexidine Gluconate Cloth  6 each Topical Q0600  . [MAR Hold] doxercalciferol  2.5 mcg Oral Q T,Th,Sa-HD  . [MAR Hold] feeding supplement (NEPRO CARB STEADY)  237 mL Oral BID BM  . [MAR Hold] fluticasone furoate-vilanterol  1 puff Inhalation Daily  . [MAR Hold] isosorbide mononitrate  30 mg Oral QPM  . [MAR  Hold] lidocaine-prilocaine  1 application Topical Daily  . [MAR Hold] Melatonin  3 mg Oral QHS  . [MAR Hold] metoprolol tartrate  12.5 mg Oral BID  . [MAR Hold] NIFEdipine  60 mg Oral BID  . [MAR Hold] pantoprazole  40 mg Intravenous Q12H  . [MAR Hold] umeclidinium bromide  1 puff Inhalation Daily    have reviewed scheduled and prn medications.  Physical Exam: General: frail- moaning due to toe pain Heart: RRR Lungs: mostly clear Abdomen: soft, non tender Extremities: no edema- toes ischemic Dialysis Access: left upper AVG- patent    11/04/2017,11:10 AM  LOS: 1 day

## 2017-11-04 NOTE — Anesthesia Postprocedure Evaluation (Signed)
Anesthesia Post Note  Patient: Nicole Cox  Procedure(s) Performed: ESOPHAGOGASTRODUODENOSCOPY (EGD) WITH PROPOFOL (N/A ) BIOPSY     Patient location during evaluation: PACU Anesthesia Type: MAC Level of consciousness: awake Pain management: pain level controlled Vital Signs Assessment: post-procedure vital signs reviewed and stable Respiratory status: spontaneous breathing, nonlabored ventilation, respiratory function stable and patient connected to nasal cannula oxygen Cardiovascular status: stable and blood pressure returned to baseline Postop Assessment: no apparent nausea or vomiting Anesthetic complications: no    Last Vitals:  Vitals:   11/04/17 1141 11/04/17 1226  BP: (!) 129/37 (!) 149/46  Pulse: 79 74  Resp: 20 18  Temp:  36.9 C  SpO2: 98% 92%    Last Pain:  Vitals:   11/04/17 1226  TempSrc: Oral  PainSc: 10-Worst pain ever                 Demeka Sutter P Treyvonne Tata

## 2017-11-04 NOTE — Telephone Encounter (Signed)
A reminder to call the pt in 3 months to schedule EGD/EUS Pt currently admitted and will be given appt at discharge

## 2017-11-04 NOTE — Progress Notes (Signed)
Patient back from EGD, HD called to receive report- to to hold BP medications.  Patient stable, complaining of toe pain.

## 2017-11-04 NOTE — Plan of Care (Signed)
  Problem: Education: Goal: Knowledge of General Education information will improve Description Including pain rating scale, medication(s)/side effects and non-pharmacologic comfort measures Outcome: Progressing   Problem: Health Behavior/Discharge Planning: Goal: Ability to manage health-related needs will improve Outcome: Progressing   Problem: Safety: Goal: Ability to remain free from injury will improve Outcome: Progressing   Problem: Pain Managment: Goal: General experience of comfort will improve Outcome: Progressing   

## 2017-11-04 NOTE — Anesthesia Preprocedure Evaluation (Addendum)
Anesthesia Evaluation  Patient identified by MRN, date of birth, ID band Patient confused    Reviewed: Allergy & Precautions, NPO status , Patient's Chart, lab work & pertinent test results  Airway Mallampati: II  TM Distance: >3 FB Neck ROM: Full    Dental  (+) Missing,    Pulmonary COPD,  COPD inhaler, former smoker,    Pulmonary exam normal breath sounds clear to auscultation       Cardiovascular hypertension, Pt. on home beta blockers and Pt. on medications + CAD, + Peripheral Vascular Disease and +CHF  Normal cardiovascular exam+ dysrhythmias Atrial Fibrillation  Rhythm:Regular Rate:Normal  ECG: SR, rate 78  ECHO: LV EF: 65% -   70%   Neuro/Psych PSYCHIATRIC DISORDERS CVA    GI/Hepatic negative GI ROS, Neg liver ROS,   Endo/Other  diabetes  Renal/GU ESRF and DialysisRenal disease     Musculoskeletal negative musculoskeletal ROS (+)   Abdominal   Peds  Hematology  (+) anemia , HLD   Anesthesia Other Findings Hematemesis  Reproductive/Obstetrics                           Anesthesia Physical Anesthesia Plan  ASA: IV  Anesthesia Plan: MAC   Post-op Pain Management:    Induction: Intravenous  PONV Risk Score and Plan: 2 and Propofol infusion and Treatment may vary due to age or medical condition  Airway Management Planned: Nasal Cannula  Additional Equipment:   Intra-op Plan:   Post-operative Plan:   Informed Consent: I have reviewed the patients History and Physical, chart, labs and discussed the procedure including the risks, benefits and alternatives for the proposed anesthesia with the patient or authorized representative who has indicated his/her understanding and acceptance.   Dental advisory given  Plan Discussed with: CRNA and Surgeon  Anesthesia Plan Comments: (Spoke with daughter regarding new onset chest pain and anesthesia plan. )      Anesthesia Quick  Evaluation

## 2017-11-05 DIAGNOSIS — I482 Chronic atrial fibrillation: Secondary | ICD-10-CM | POA: Diagnosis not present

## 2017-11-05 DIAGNOSIS — E1121 Type 2 diabetes mellitus with diabetic nephropathy: Secondary | ICD-10-CM | POA: Diagnosis not present

## 2017-11-05 DIAGNOSIS — K922 Gastrointestinal hemorrhage, unspecified: Secondary | ICD-10-CM

## 2017-11-05 DIAGNOSIS — J449 Chronic obstructive pulmonary disease, unspecified: Secondary | ICD-10-CM | POA: Diagnosis not present

## 2017-11-05 DIAGNOSIS — K253 Acute gastric ulcer without hemorrhage or perforation: Secondary | ICD-10-CM

## 2017-11-05 LAB — CBC WITH DIFFERENTIAL/PLATELET
ABS IMMATURE GRANULOCYTES: 0 10*3/uL (ref 0.0–0.1)
BASOS ABS: 0.1 10*3/uL (ref 0.0–0.1)
Basophils Relative: 1 %
Eosinophils Absolute: 0.4 10*3/uL (ref 0.0–0.7)
Eosinophils Relative: 5 %
HCT: 31 % — ABNORMAL LOW (ref 36.0–46.0)
HEMOGLOBIN: 9.2 g/dL — AB (ref 12.0–15.0)
Immature Granulocytes: 1 %
LYMPHS ABS: 1.3 10*3/uL (ref 0.7–4.0)
LYMPHS PCT: 15 %
MCH: 25.9 pg — ABNORMAL LOW (ref 26.0–34.0)
MCHC: 29.7 g/dL — ABNORMAL LOW (ref 30.0–36.0)
MCV: 87.3 fL (ref 78.0–100.0)
Monocytes Absolute: 0.9 10*3/uL (ref 0.1–1.0)
Monocytes Relative: 10 %
NEUTROS ABS: 5.8 10*3/uL (ref 1.7–7.7)
Neutrophils Relative %: 68 %
Platelets: 268 10*3/uL (ref 150–400)
RBC: 3.55 MIL/uL — AB (ref 3.87–5.11)
RDW: 19.4 % — ABNORMAL HIGH (ref 11.5–15.5)
WBC: 8.4 10*3/uL (ref 4.0–10.5)

## 2017-11-05 LAB — GLUCOSE, CAPILLARY: GLUCOSE-CAPILLARY: 69 mg/dL — AB (ref 70–99)

## 2017-11-05 LAB — BASIC METABOLIC PANEL
Anion gap: 10 (ref 5–15)
BUN: 11 mg/dL (ref 8–23)
CHLORIDE: 99 mmol/L (ref 98–111)
CO2: 27 mmol/L (ref 22–32)
CREATININE: 3.38 mg/dL — AB (ref 0.44–1.00)
Calcium: 8.6 mg/dL — ABNORMAL LOW (ref 8.9–10.3)
GFR calc non Af Amer: 12 mL/min — ABNORMAL LOW (ref >60)
GFR, EST AFRICAN AMERICAN: 13 mL/min — AB (ref >60)
Glucose, Bld: 71 mg/dL (ref 70–99)
POTASSIUM: 3.9 mmol/L (ref 3.5–5.1)
SODIUM: 136 mmol/L (ref 135–145)

## 2017-11-05 LAB — PROCALCITONIN: PROCALCITONIN: 0.28 ng/mL

## 2017-11-05 LAB — HEPATITIS B SURFACE ANTIGEN: Hepatitis B Surface Ag: NEGATIVE

## 2017-11-05 MED ORDER — HYDROMORPHONE HCL 2 MG PO TABS: 1.0000 mg | ORAL_TABLET | Freq: Four times a day (QID) | ORAL | 0 refills | Status: DC | PRN

## 2017-11-05 MED ORDER — HYDROMORPHONE HCL 2 MG PO TABS: 1.0000 mg | ORAL_TABLET | Freq: Four times a day (QID) | ORAL | 0 refills | Status: AC | PRN

## 2017-11-05 MED ORDER — FENTANYL 25 MCG/HR TD PT72: 25.0000 ug | MEDICATED_PATCH | TRANSDERMAL | 0 refills | Status: AC

## 2017-11-05 MED ORDER — PANTOPRAZOLE SODIUM 40 MG PO TBEC: 40.0000 mg | DELAYED_RELEASE_TABLET | Freq: Two times a day (BID) | ORAL | Status: AC

## 2017-11-05 NOTE — Plan of Care (Signed)
  Problem: Pain Managment: Goal: General experience of comfort will improve Outcome: Progressing   Problem: Safety: Goal: Ability to remain free from injury will improve Outcome: Progressing   Problem: Education: Goal: Ability to demonstrate management of disease process will improve Outcome: Progressing Goal: Ability to verbalize understanding of medication therapies will improve Outcome: Progressing   

## 2017-11-05 NOTE — Progress Notes (Signed)
Patient transported by [facility]. All personal belongings with patient. Pt demonstrates no distress at this time. Daughter aware of pt transfer back to facility .

## 2017-11-05 NOTE — Care Management Obs Status (Signed)
MEDICARE OBSERVATION STATUS NOTIFICATION   Patient Details  Name: Al DecantLizzie Coull MRN: [id number] Date of Birth: Jul 15, 1931   Medicare Observation Status Notification Given:  Other (see comment)(refused to sign)    Cherrie Distancehandler, Elizibeth Breau L, RN 11/05/2017, 10:41 AM

## 2017-11-05 NOTE — Progress Notes (Addendum)
14:23: [facility] called for transportation  Patient is set to discharge back to [facility] today. Patient & daughter, [name], aware. Discharge packet placed on chart. DNR and fentanyl/ Dilaidid prescriptions need to be signed before transportation can be called- MD aware. CSW paged MD and will follow up.   Stacy GardnerErin Niasia Lanphear, LCSW Clinical Social Worker  [facility] [employer]  [phone]

## 2017-11-05 NOTE — Discharge Summary (Signed)
Discharge Summary  Nicole Cox ZOX:[id number] DOB: 1932-01-10  PCP: Nicole Reamer, MD  Admit date: 11/02/2017 Discharge date: 11/05/2017  Time spent: 30 mins   Recommendations for Outpatient Follow-up:  1. PCP 2. GI on 01/10/18  Discharge Diagnoses:  Active Hospital Problems   Diagnosis Date Noted  . Hematemesis 11/02/2017  . Acute gastric ulcer without hemorrhage or perforation   . GI bleed 11/03/2017  . Leukocytosis 11/02/2017  . Atrial fibrillation, chronic (HCC) 11/02/2017  . PAD (peripheral artery disease) (HCC) 11/02/2017  . Gangrene of toe of both feet (HCC) 11/02/2017  . ESRD on dialysis (HCC)   . CAD (coronary artery disease) 08/11/2012  . COPD (chronic obstructive pulmonary disease) (HCC) 08/11/2012  . DM (diabetes mellitus), type 2 with renal complications (HCC) 08/11/2012  . Hypertension 08/11/2012    Resolved Hospital Problems  No resolved problems to display.    Discharge Condition: Stable  Diet recommendation: Heart healthy/renal diet  Vitals:   11/05/17 0742 11/05/17 0913  BP:  (!) 158/51  Pulse: 82 78  Resp: 18 18  Temp:    SpO2: 92% 95%    History of present illness:  [name] a 84 y.o.femalewith medical history significant ofhypertension, hyperlipidemia, diet-controlled diabetes, COPD, stroke, CHF, CAD, atrial fibrillation not on anticoagulants, ESRD-HD(TTS),PAD withbilateral gangrene feet,dysphagia, who presents with hematemesis. Per report, pt had nausea and vomitedapproximately 60 cc of dark material while she was receiving dialysis on 8/20. Of note,patient was recently hospitalized from 8/2-8/14 due toaspiration pneumonia, acute metabolic encephalopathyand PAD with gangrenous changes in both toes. Pt admitted for further management.  Today, pt noted to be more awake. Denies any new complaints. Stable for discharge back to SNF.  Hospital Course:  Principal Problem:   Hematemesis Active Problems:   Hypertension   DM  (diabetes mellitus), type 2 with renal complications (HCC)   COPD (chronic obstructive pulmonary disease) (HCC)   CAD (coronary artery disease)   ESRD on dialysis (HCC)   Leukocytosis   Atrial fibrillation, chronic (HCC)   PAD (peripheral artery disease) (HCC)   Gangrene of toe of both feet (HCC)   GI bleed   Acute gastric ulcer without hemorrhage or perforation  Hematemesis/Gastric ulcer/erosive gastropathy No further episode noted Hemoglobin stable at 9.2 Hemodynamically stable No record of previous EGD or colonoscopy on file GI consulted: EGD done on 11/04/17 showed: - No gross lesions in esophagus. - White nummular lesions in esophageal mucosa. Cells for cytology obtained. - Erythematous mucosa in the gastric body and antrum and associated non-bleeding   erosive gastropathy. - Non-bleeding gastric ulcer with a clean ulcer base (Forrest Class III). - A single submucosal papule (nodule) found in the stomach. Tunnel biopsied. GI recommend follow up in clinic, repeat endoscopy in 3 months and possible add on EUS. OK for ASA. PPI 40 mg BID for 3 months Started on PPI Follow up appointment with GI on 01/10/18 @ 1:30 pm  Acute metabolic encephalopathy Improved Likely Opoid induced, no signs of infection Afebrile, resolved leukocytosis BC X 2 NGTD Procalcitonin 0.22-->0.21-->0.28 CXR no acute abnormality May continue fentanyl patch, adjusted dose of dilaudid and should be given only when needed. Watch out for excessive sedation.  HTN Continue home medications:Metoprolol, nifedipine  Diet controlledDM type 2 A1c 4.6  CAD Nochest pain Trop neg, EKG NSR Continue aspirin, Lipitor,imdur,metoprolol  COPD Stable Continue bronchodilators.  ESRD on dialysis (TTS) Continue HD  Atrial Fibrillation Rate controlled CHA2DS2-VASc Scoreis 9, not on chronic AC Continuemetoprolol  PADand gangrene of toe of  both feet [facility]): per discharge summary from  recentlyprevious admission,"she underwent aortogram 8/9-diffuse calcific atherosclerosis, occluded posterior tibials bilaterally, failed attempt at right tibial cannulation. Per vascular surgeons, no endovascular options for revascularization and she is not a candidate for open vascular bypass, only option would be amputation for progressive gangrenous changes". Current plan is tocontinue narcotics, palliative measures. May continue fentanyl patch, adjusted dose of dilaudid and should be given only when needed. Watch out for excessive sedation.    Procedures:  EGD on 11/04/17  Consultations:  GI  Discharge Exam: BP (!) 158/51 (BP Location: Right Arm)   Pulse 78   Temp 98.9 F (37.2 C) (Oral)   Resp 18   Ht 5\' 2"  (1.575 m)   Wt 58.9 kg   SpO2 95%   BMI 23.75 kg/m   General: NAD  Cardiovascular: S1, S2 present Respiratory: CTAB  Discharge Instructions You were cared for by a hospitalist during your hospital stay. If you have any questions about your discharge medications or the care you received while you were in the hospital after you are discharged, you can call the unit and asked to speak with the hospitalist on call if the hospitalist that took care of you is not available. Once you are discharged, your primary care physician will handle any further medical issues. Please note that NO REFILLS for any discharge medications will be authorized once you are discharged, as it is imperative that you return to your primary care physician (or establish a relationship with a primary care physician if you do not have one) for your aftercare needs so that they can reassess your need for medications and monitor your lab values.   Allergies as of 11/05/2017   No Known Allergies     Medication List    TAKE these medications   acetaminophen 325 MG tablet Commonly known as:  TYLENOL Take 650 mg by mouth every 6 (six) hours as needed for mild pain.   albuterol 108 (90 Base) MCG/ACT  inhaler Commonly known as:  PROVENTIL HFA;VENTOLIN HFA Inhale 2 puffs into the lungs every 6 (six) hours as needed for wheezing or shortness of breath.   aspirin EC 81 MG tablet Take 81 mg by mouth daily.   atorvastatin 20 MG tablet Commonly known as:  LIPITOR Take 1 tablet (20 mg total) by mouth daily at 6 PM. What changed:  when to take this   doxercalciferol 2.5 MCG capsule Commonly known as:  HECTOROL Take 1 capsule (2.5 mcg total) by mouth Every Tuesday,Thursday,and Saturday with dialysis.   feeding supplement (NEPRO CARB STEADY) Liqd Take 237 mLs by mouth 2 (two) times daily between meals.   fentaNYL 25 MCG/HR patch Commonly known as:  DURAGESIC - dosed mcg/hr Place 1 patch (25 mcg total) onto the skin every 3 (three) days.   fluticasone furoate-vilanterol 100-25 MCG/INH Aepb Commonly known as:  BREO ELLIPTA Inhale 1 puff into the lungs daily.   gabapentin 300 MG capsule Commonly known as:  NEURONTIN Take 1 capsule (300 mg total) by mouth at bedtime.   HYDROmorphone 2 MG tablet Commonly known as:  DILAUDID Take 0.5 tablets (1 mg total) by mouth every 6 (six) hours as needed for moderate pain or severe pain. What changed:    how much to take  when to take this   INCRUSE ELLIPTA 62.5 MCG/INH Aepb Generic drug:  umeclidinium bromide Inhale 1 puff into the lungs daily.   isosorbide mononitrate 30 MG 24 hr tablet Commonly known as:  IMDUR Take 30 mg by mouth every evening.   lidocaine-prilocaine cream Commonly known as:  EMLA Apply 1 application topically daily.   Melatonin 3 MG Tabs Take 3 mg by mouth at bedtime.   metoprolol tartrate 25 MG tablet Commonly known as:  LOPRESSOR Take 0.5 tablets (12.5 mg total) by mouth 2 (two) times daily.   NIFEdipine 60 MG 24 hr tablet Commonly known as:  PROCARDIA XL/ADALAT-CC Take 60 mg by mouth 2 (two) times daily.   pantoprazole 40 MG tablet Commonly known as:  PROTONIX Take 1 tablet (40 mg total) by mouth 2  (two) times daily before a meal.   polyethylene glycol packet Commonly known as:  MIRALAX / GLYCOLAX Take 17 g by mouth daily.      No Known Allergies  Contact information for follow-up providers    Mansouraty, Netty StarringGabriel Jr., MD. Go on 01/10/2018.   Specialties:  Gastroenterology, Internal Medicine Why:  At 1:30pm on 01/10/2018 for follow up appointment Contact information: [address] [place] [address] [phone]            Contact information for after-discharge care    Destination    [facility] .   Service:  Skilled Nursing Contact information: [address] [id number] [phone]                   The results of significant diagnostics from this hospitalization (including imaging, microbiology, ancillary and laboratory) are listed below for reference.    Significant Diagnostic Studies: Ct Head Wo Contrast  Result Date: 10/15/2017 CLINICAL DATA:  Altered level of consciousness. EXAM: CT HEAD WITHOUT CONTRAST TECHNIQUE: Contiguous axial images were obtained from the base of the skull through the vertex without intravenous contrast. COMPARISON:  CT head 07/27/2017 FINDINGS: Brain: Mild atrophy. Moderate chronic microvascular ischemic change in the white matter. Negative for acute infarct, hemorrhage, mass. No change from the prior study. Empty sella incidentally noted. Vascular: Negative for hyperdense vessel Skull: Negative Sinuses/Orbits: Interval development of right mastoid effusion and middle ear effusion. Left mastoid sinus clear. Paranasal sinuses clear. Bilateral cataract surgery. No orbital mass. Other: None IMPRESSION: Atrophy and chronic microvascular ischemia. No acute intracranial abnormality Interval development of right mastoid and middle ear effusion. Electronically Signed   By: Marlan Palauharles  Clark M.D.   On: 10/15/2017 11:13   Dg Chest Port 1 View  Result Date: 11/02/2017 CLINICAL DATA:  Leukocytosis. EXAM:  PORTABLE CHEST 1 VIEW COMPARISON:  Single-view of the chest 10/16/2017, 10/15/2017 and 12/15/2015. FINDINGS: Calcified bilateral pleural plaques are again seen. Chronic coarsening of the pulmonary interstitium bilaterally is also identified and not notably changed since the most recent comparison exams. There is cardiomegaly. Aortic atherosclerosis is noted. No pneumothorax or pleural fluid. IMPRESSION: Findings most compatible with chronic interstitial lung disease with calcified pleural plaques identified. Cardiomegaly. Atherosclerosis. Electronically Signed   By: Drusilla Kannerhomas  Dalessio M.D.   On: 11/02/2017 20:44   Dg Chest Port 1 View  Result Date: 10/16/2017 CLINICAL DATA:  Pneumonia. EXAM: PORTABLE CHEST 1 VIEW COMPARISON:  10/15/2017 FINDINGS: The cardiac silhouette remains enlarged. Diffusely increased interstitial markings are similar to the prior study. Calcified pleural plaques are again noted in the right lower hemithorax. There is also mild pleural thickening or calcification in the right lung apex. No sizable pleural effusion or pneumothorax is identified. IMPRESSION: Unchanged diffuse interstitial densities which may reflect mild edema superimposed on chronic lung disease. Electronically Signed   By: Sebastian AcheAllen  Grady  M.D.   On: 10/16/2017 08:03   Dg Chest Portable 1 View  Result Date: 10/15/2017 CLINICAL DATA:  Shortness of breath. EXAM: PORTABLE CHEST 1 VIEW COMPARISON:  10/01/2017 FINDINGS: The heart is enlarged. There is tortuosity and calcification of the thoracic aorta. Extensive interstitial changes appear relatively stable and could reflect interstitial disease or interstitial edema. No focal infiltrates. Dense calcifications at the right lung base likely pleural calcifications related to prior hemothorax or empyema. No definite pleural effusions. Chest CT may be helpful for further evaluation. IMPRESSION: Cardiac enlargement and probable interstitial edema superimposed on chronic lung disease. CT  may be helpful for further evaluation. Electronically Signed   By: Rudie Meyer M.D.   On: 10/15/2017 09:42    Microbiology: Recent Results (from the past 240 hour(s))  Culture, blood (Routine X 2) w Reflex to ID Panel     Status: None (Preliminary result)   Collection Time: 11/02/17 11:45 PM  Result Value Ref Range Status   Specimen Description BLOOD RIGHT HAND  Final   Special Requests   Final    BOTTLES DRAWN AEROBIC ONLY Blood Culture results may not be optimal due to an inadequate volume of blood received in culture bottles   Culture   Final    NO GROWTH 1 DAY Performed at [facility], [address] [place], [place] [address]    Report Status PENDING  Incomplete  Culture, blood (Routine X 2) w Reflex to ID Panel     Status: None (Preliminary result)   Collection Time: 11/02/17 11:50 PM  Result Value Ref Range Status   Specimen Description BLOOD RIGHT ARM  Final   Special Requests   Final    BOTTLES DRAWN AEROBIC ONLY Blood Culture adequate volume   Culture   Final    NO GROWTH 1 DAY Performed at [facility], [address], [place], [place] [address]    Report Status PENDING  Incomplete     Labs: Basic Metabolic Panel: Recent Labs  Lab 11/02/17 1812 11/03/17 0731 11/04/17 0435 11/05/17 0612  NA 138 138 135 136  K 5.0 5.0 4.9 3.9  CL 100 101 103 99  CO2 27 25 23 27   GLUCOSE 119* 77 58* 71  BUN 14 17 22 11   CREATININE 3.86* 4.43* 5.02* 3.38*  CALCIUM 8.9 8.7* 7.9* 8.6*   Liver Function Tests: Recent Labs  Lab 11/02/17 1812  AST 51*  ALT 27  ALKPHOS 118  BILITOT 0.4  PROT 7.8  ALBUMIN 2.5*   No results for input(s): LIPASE, AMYLASE in the last 168 hours. No results for input(s): AMMONIA in the last 168 hours. CBC: Recent Labs  Lab 11/02/17 1812 11/02/17 2330 11/03/17 0731 11/03/17 1435 11/04/17 0435 11/05/17 0612  WBC 13.8* 13.5* 11.0* 9.3 8.0 8.4  NEUTROABS 10.6*  --   --   --  4.9 5.8  HGB 10.4* 8.7* 9.7* 8.9* 8.2* 9.2*  HCT  36.1 30.9* 33.6* 31.2* 28.4* 31.0*  MCV 90.9 92.5 91.3 91.5 89.9 87.3  PLT 315 321 334 324 307 268   Cardiac Enzymes: No results for input(s): CKTOTAL, CKMB, CKMBINDEX, TROPONINI in the last 168 hours. BNP: BNP (last 3 results) Recent Labs    06/07/17 1000 09/25/17 2145  BNP 1,685.3* 2,183.6*    ProBNP (last 3 results) No results for input(s): PROBNP in the last 8760 hours.  CBG: Recent Labs  Lab 11/04/17 2220 11/05/17 0732  GLUCAP 89 69*       Signed:  Briant Cedar, MD [facility] 11/05/2017, 10:26 AM

## 2017-11-05 NOTE — Care Management CC44 (Signed)
Condition Code 44 Documentation Completed  Patient Details  Name: Al DecantLizzie Deneault MRN: [id number] Date of Birth: 04/04/1931   Condition Code 44 given:  Yes(refused to sign) Patient signature on Condition Code 44 notice:  (refused) Documentation of 2 MD's agreement:  Yes Code 44 added to claim:  Yes    Cherrie DistanceChandler, Tymarion Everard L, RN 11/05/2017, 10:41 AM

## 2017-11-05 NOTE — Progress Notes (Signed)
Subjective:  S/p HD yest- removed 2000 tolerated well - hgb increased  Objective Vital signs in last 24 hours: Vitals:   11/04/17 2208 11/05/17 0442 11/05/17 0446 11/05/17 0742  BP: (!) 169/53  (!) 158/46   Pulse: 91  81 82  Resp:   18 18  Temp:   98.9 F (37.2 C)   TempSrc:   Oral   SpO2:   (!) 88% 92%  Weight:  58.9 kg    Height:       Weight change: 0.2 kg  Intake/Output Summary (Last 24 hours) at 11/05/2017 4098 Last data filed at 11/04/2017 2206 Gross per 24 hour  Intake 820 ml  Output 2000 ml  Net -1180 ml    Dialysis Orders: [facility]/ Dr Louis Meckel TTS 3h 121lbs 2/2.5 Hep 2000 then 250 / hr AVG  - mircera150 q 2 weeks-  -hect 1 mcg IV q tx  Assessment/ Plan: Pt is a 84 y.o. yo female ESRD, PAD without revasc options- frequent hospitalizations who was admitted on 11/02/2017 with hematemesis  Assessment/Plan: 1. hemetamesis- per GI- EGD negative - hgb trending up 2. ESRD - normally TTS at [facility] via [facility]- next will be Saturday , no heparin 3. Anemia- GI blood loss and CKD- dosed ESA here 4. Secondary hyperparathyroidism- cont hectorol- no binder on med list 5. HTN/volume- reasonable at this time- metoprolol/procardia continued 6. Dispo- chronically ill- PAD that is not fixable- Palliative care has been involved- is DNR but wants to continue dialysis for now.  Pt seems ready to be discharged to SNF.  Will arrange for HD here on Saturday if still here    Matthewjames Petrasek A    Labs: Basic Metabolic Panel: Recent Labs  Lab 11/03/17 0731 11/04/17 0435 11/05/17 0612  NA 138 135 136  K 5.0 4.9 3.9  CL 101 103 99  CO2 25 23 27   GLUCOSE 77 58* 71  BUN 17 22 11   CREATININE 4.43* 5.02* 3.38*  CALCIUM 8.7* 7.9* 8.6*   Liver Function Tests: Recent Labs  Lab 11/02/17 1812  AST 51*  ALT 27  ALKPHOS 118  BILITOT 0.4  PROT 7.8  ALBUMIN 2.5*   No results for input(s): LIPASE, AMYLASE in the last 168 hours. No results for input(s): AMMONIA in the  last 168 hours. CBC: Recent Labs  Lab 11/02/17 1812 11/02/17 2330 11/03/17 0731 11/03/17 1435 11/04/17 0435 11/05/17 0612  WBC 13.8* 13.5* 11.0* 9.3 8.0 8.4  NEUTROABS 10.6*  --   --   --  4.9 5.8  HGB 10.4* 8.7* 9.7* 8.9* 8.2* 9.2*  HCT 36.1 30.9* 33.6* 31.2* 28.4* 31.0*  MCV 90.9 92.5 91.3 91.5 89.9 87.3  PLT 315 321 334 324 307 268   Cardiac Enzymes: No results for input(s): CKTOTAL, CKMB, CKMBINDEX, TROPONINI in the last 168 hours. CBG: Recent Labs  Lab 11/04/17 2220 11/05/17 0732  GLUCAP 89 69*    Iron Studies: No results for input(s): IRON, TIBC, TRANSFERRIN, FERRITIN in the last 72 hours. Studies/Results: No results found. Medications: Infusions: . sodium chloride 10 mL/hr at 11/02/17 2322    Scheduled Medications: . atorvastatin  20 mg Oral QPM  . Chlorhexidine Gluconate Cloth  6 each Topical Q0600  . darbepoetin (ARANESP) injection - DIALYSIS  150 mcg Intravenous Q Thu-HD  . doxercalciferol  2.5 mcg Oral Q T,Th,Sa-HD  . feeding supplement (NEPRO CARB STEADY)  237 mL Oral BID BM  . fluticasone furoate-vilanterol  1 puff Inhalation Daily  . isosorbide mononitrate  30 mg  Oral QPM  . lidocaine-prilocaine  1 application Topical Daily  . Melatonin  3 mg Oral QHS  . metoprolol tartrate  12.5 mg Oral BID  . NIFEdipine  60 mg Oral BID  . pantoprazole  40 mg Intravenous Q12H  . umeclidinium bromide  1 puff Inhalation Daily    have reviewed scheduled and prn medications.  Physical Exam: General: frail, sleepy - NAD Heart: RRR Lungs: mostly clear Abdomen: soft, non tender Extremities: no edema- toes ischemic Dialysis Access: left upper AVG- patent    11/05/2017,8:32 AM  LOS: 1 day

## 2017-11-08 LAB — CULTURE, BLOOD (ROUTINE X 2)
CULTURE: NO GROWTH
Culture: NO GROWTH
Special Requests: ADEQUATE

## 2017-11-11 ENCOUNTER — Encounter: Payer: Self-pay | Admitting: Gastroenterology

## 2018-01-10 ENCOUNTER — Ambulatory Visit: Payer: Medicare HMO | Admitting: Gastroenterology

## 2018-04-16 DEATH — deceased

## 2019-03-28 IMAGING — CT CT HEAD W/O CM
3 of 4 series · 13 of 47 positions shown, 15 images · non-contrast
Comparison: CT HEAD July 30, 2015

CLINICAL DATA: Weakness after leaving dialysis today. History of
stroke, hypertension and diabetes.

EXAM:
CT HEAD WITHOUT CONTRAST
TECHNIQUE: Contiguous axial images were obtained from the base of the skull
through the vertex without intravenous contrast.

[Series 3: head wo · axial · 0.43mm/px · z∈[-74,+41]mm · 7 of 31 slices shown, 9 images]
[im 4/31  brain]
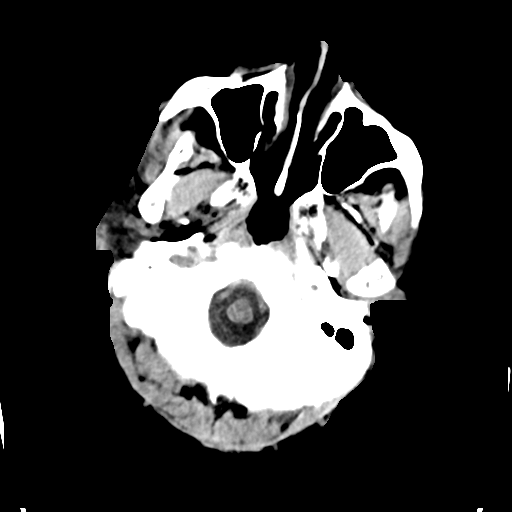
[im 4/31  bone]
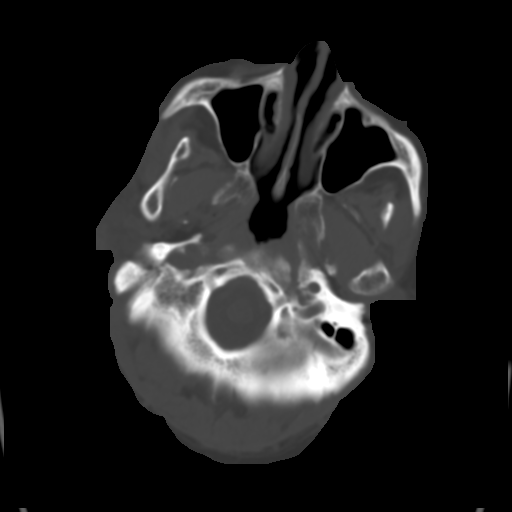
[im 8/31  brain]
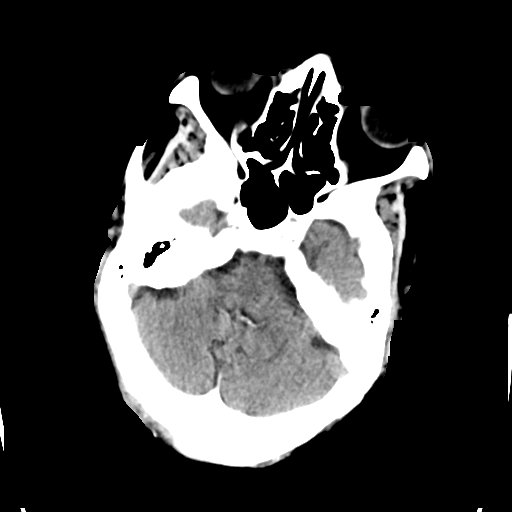
[im 12/31  brain]
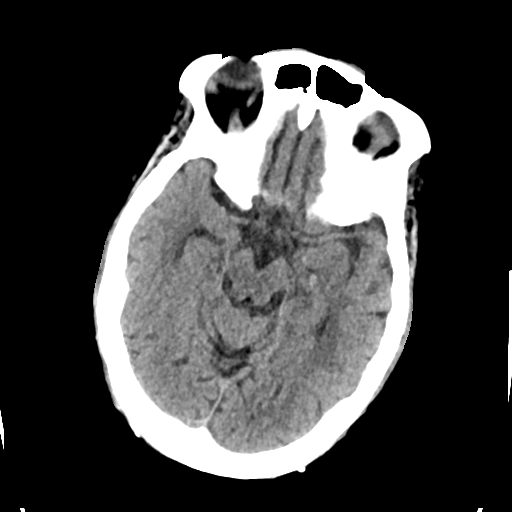
[im 16/31  brain]
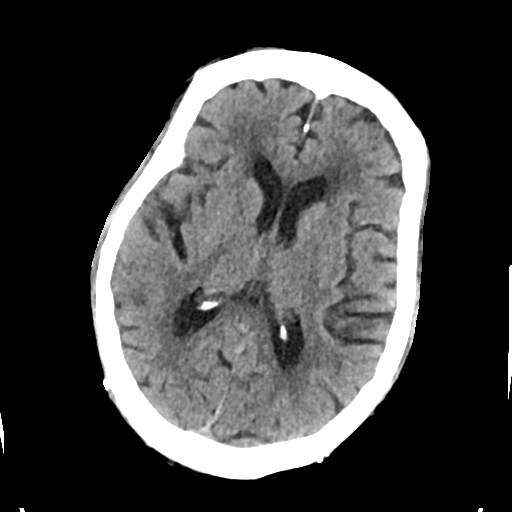
[im 19/31  brain]
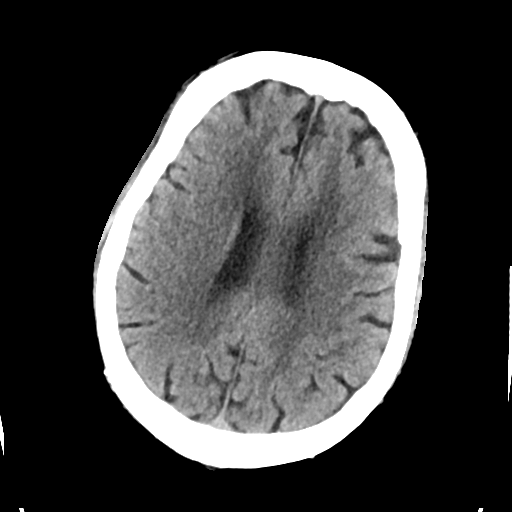
[im 19/31  bone]
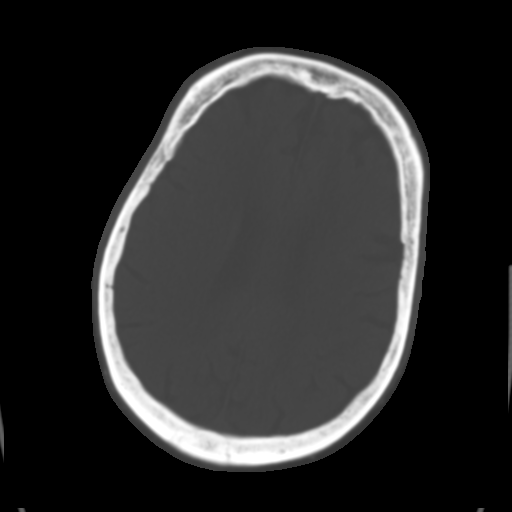
[im 23/31  brain]
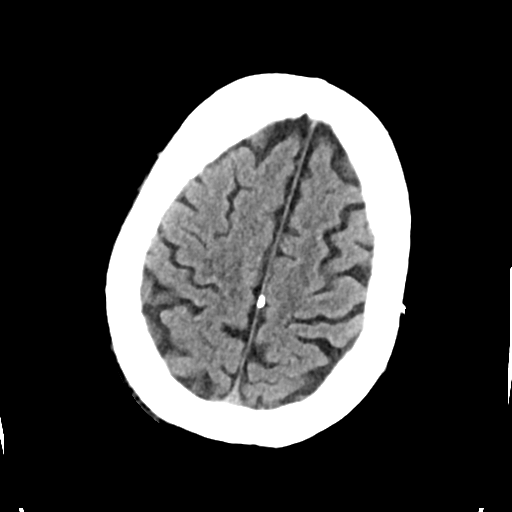
[im 27/31  brain]
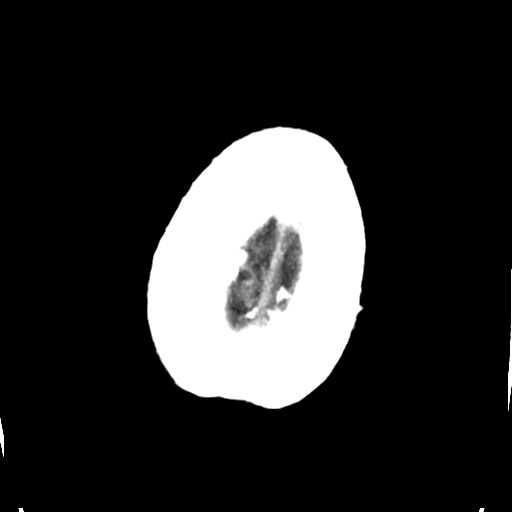

[Series 5: cor soft · coronal · 0.30mm/px · 3 of 66 slices shown]
[im 22/66  brain]
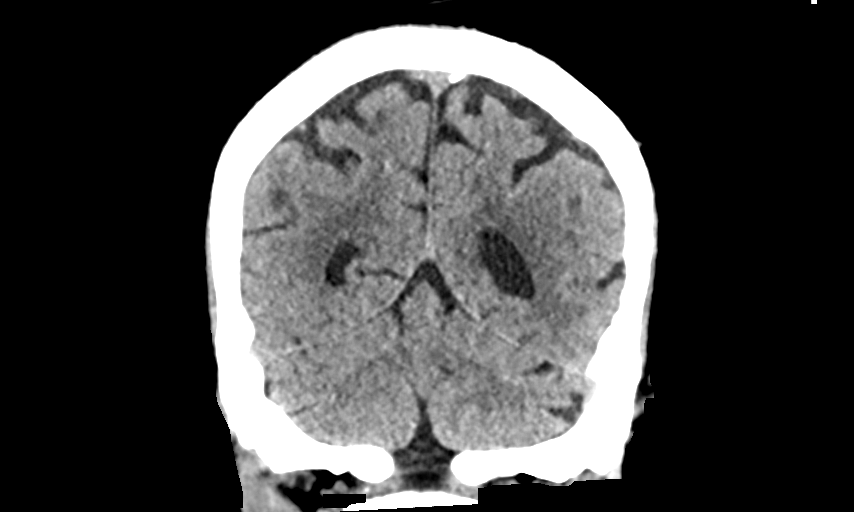
[im 29/66  brain]
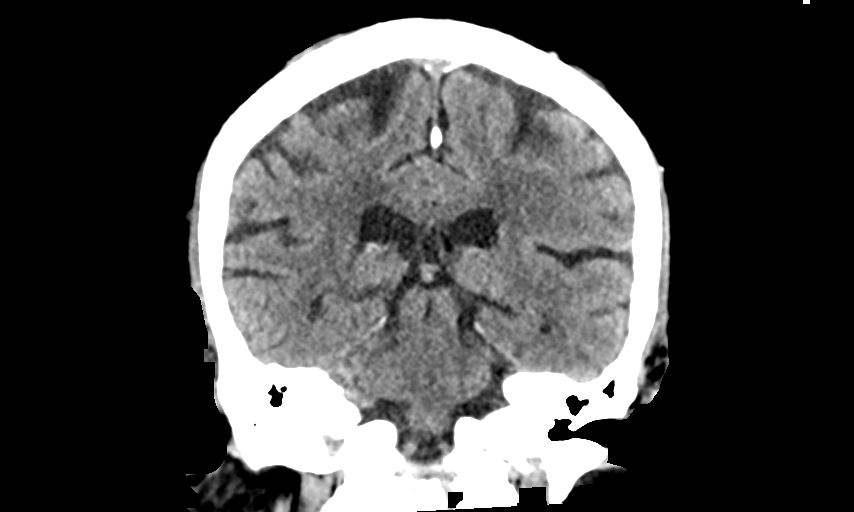
[im 37/66  brain]
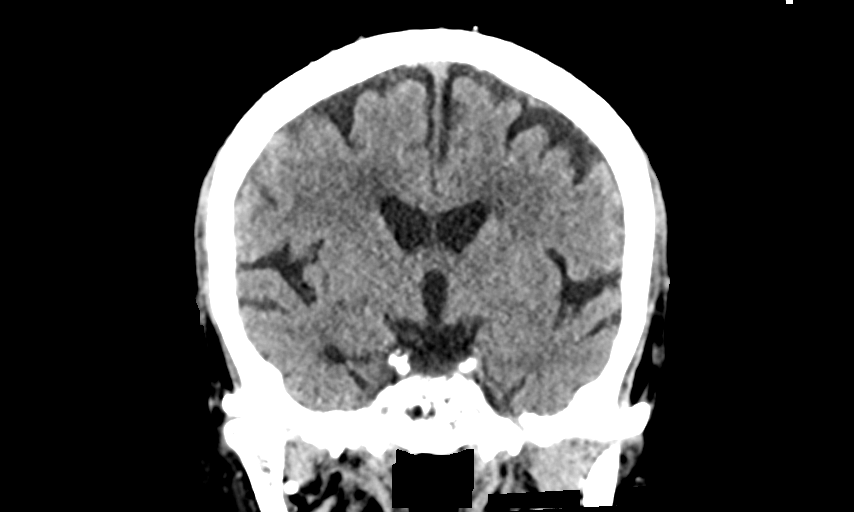

[Series 6: sag soft · sagittal · 0.31mm/px · 3 of 65 slices shown]
[im 22/65  brain]
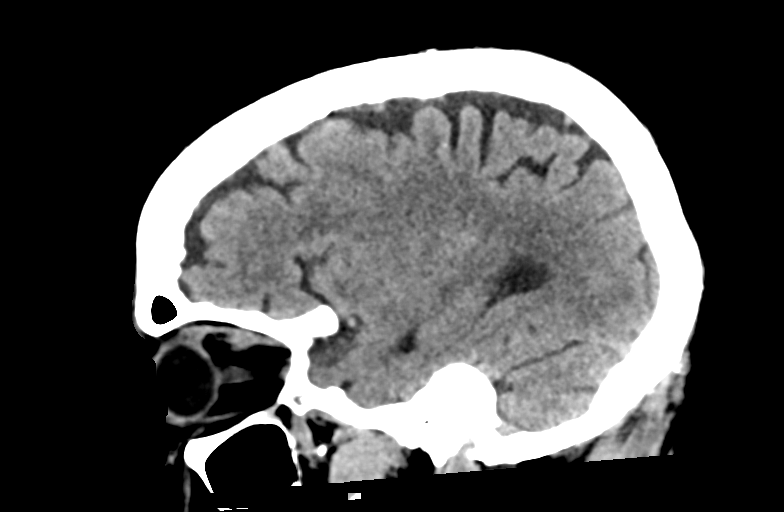
[im 33/65  brain]
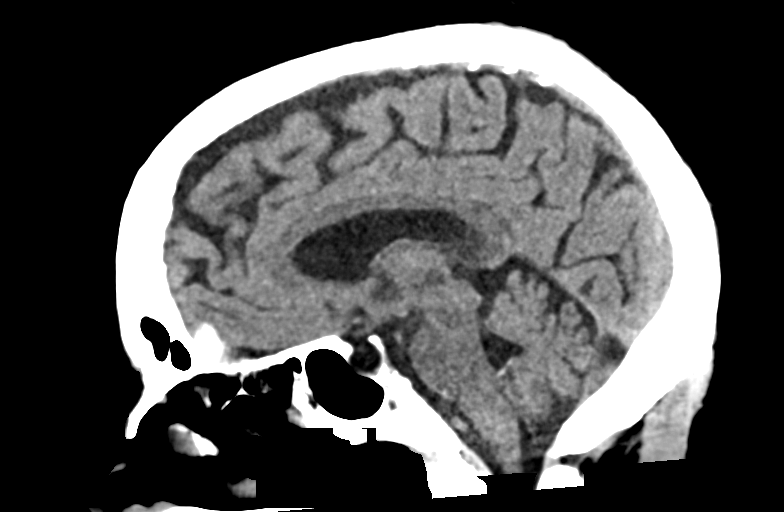
[im 43/65  brain]
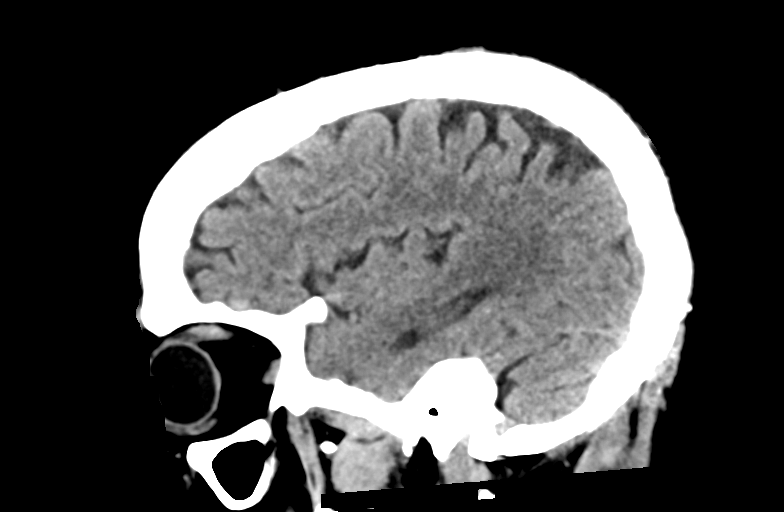

[13 of 47 positions shown; findings below may reference images not displayed]

FINDINGS: BRAIN: No intraparenchymal hemorrhage, mass effect nor midline
shift. The ventricles and sulci are normal for age. Patchy
supratentorial white matter hypodensities less than expected for
patient's age, though non-specific are most compatible with chronic
small vessel ischemic disease. No acute large vascular territory
infarcts. No abnormal extra-axial fluid collections. Basal cisterns
are patent.

VASCULAR: Moderate to severe calcific atherosclerosis of the carotid
siphons.

SKULL: No skull fracture. No significant scalp soft tissue swelling.
Partially empty sella.

SINUSES/ORBITS: The mastoid air-cells and included paranasal sinuses
are well-aerated.The included ocular globes and orbital contents are
non-suspicious. Status post bilateral ocular lens implants.

OTHER: None.
IMPRESSION: Negative noncontrast CT HEAD for age.

## 2019-03-28 IMAGING — CR DG CHEST 2V
2 series · 2 of 2 positions shown · non-contrast
Comparison: Chest radiograph performed 06/07/2017

CLINICAL DATA: Acute onset of generalized weakness and fever.

EXAM:
CHEST - 2 VIEW

[chest lat]
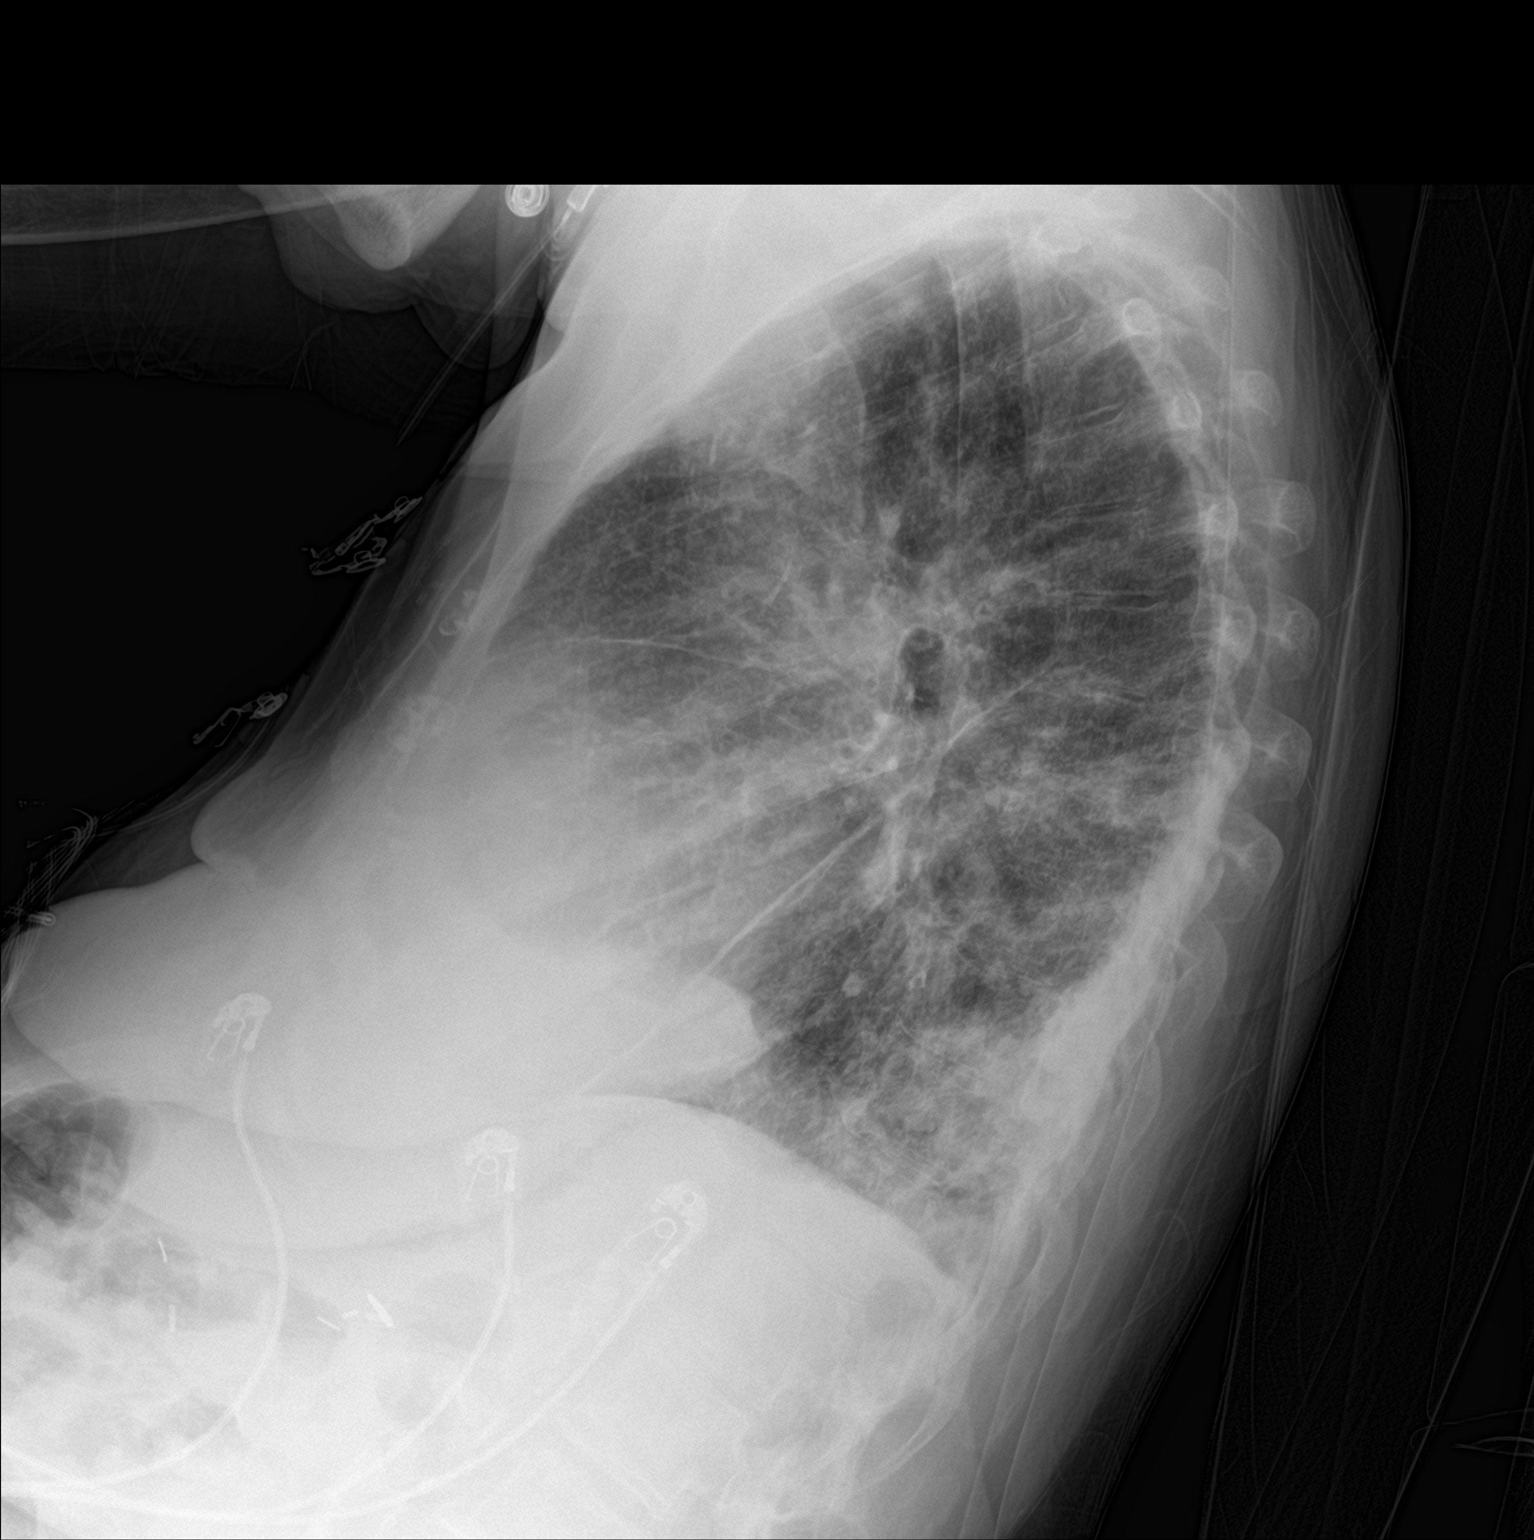

[chest ap]
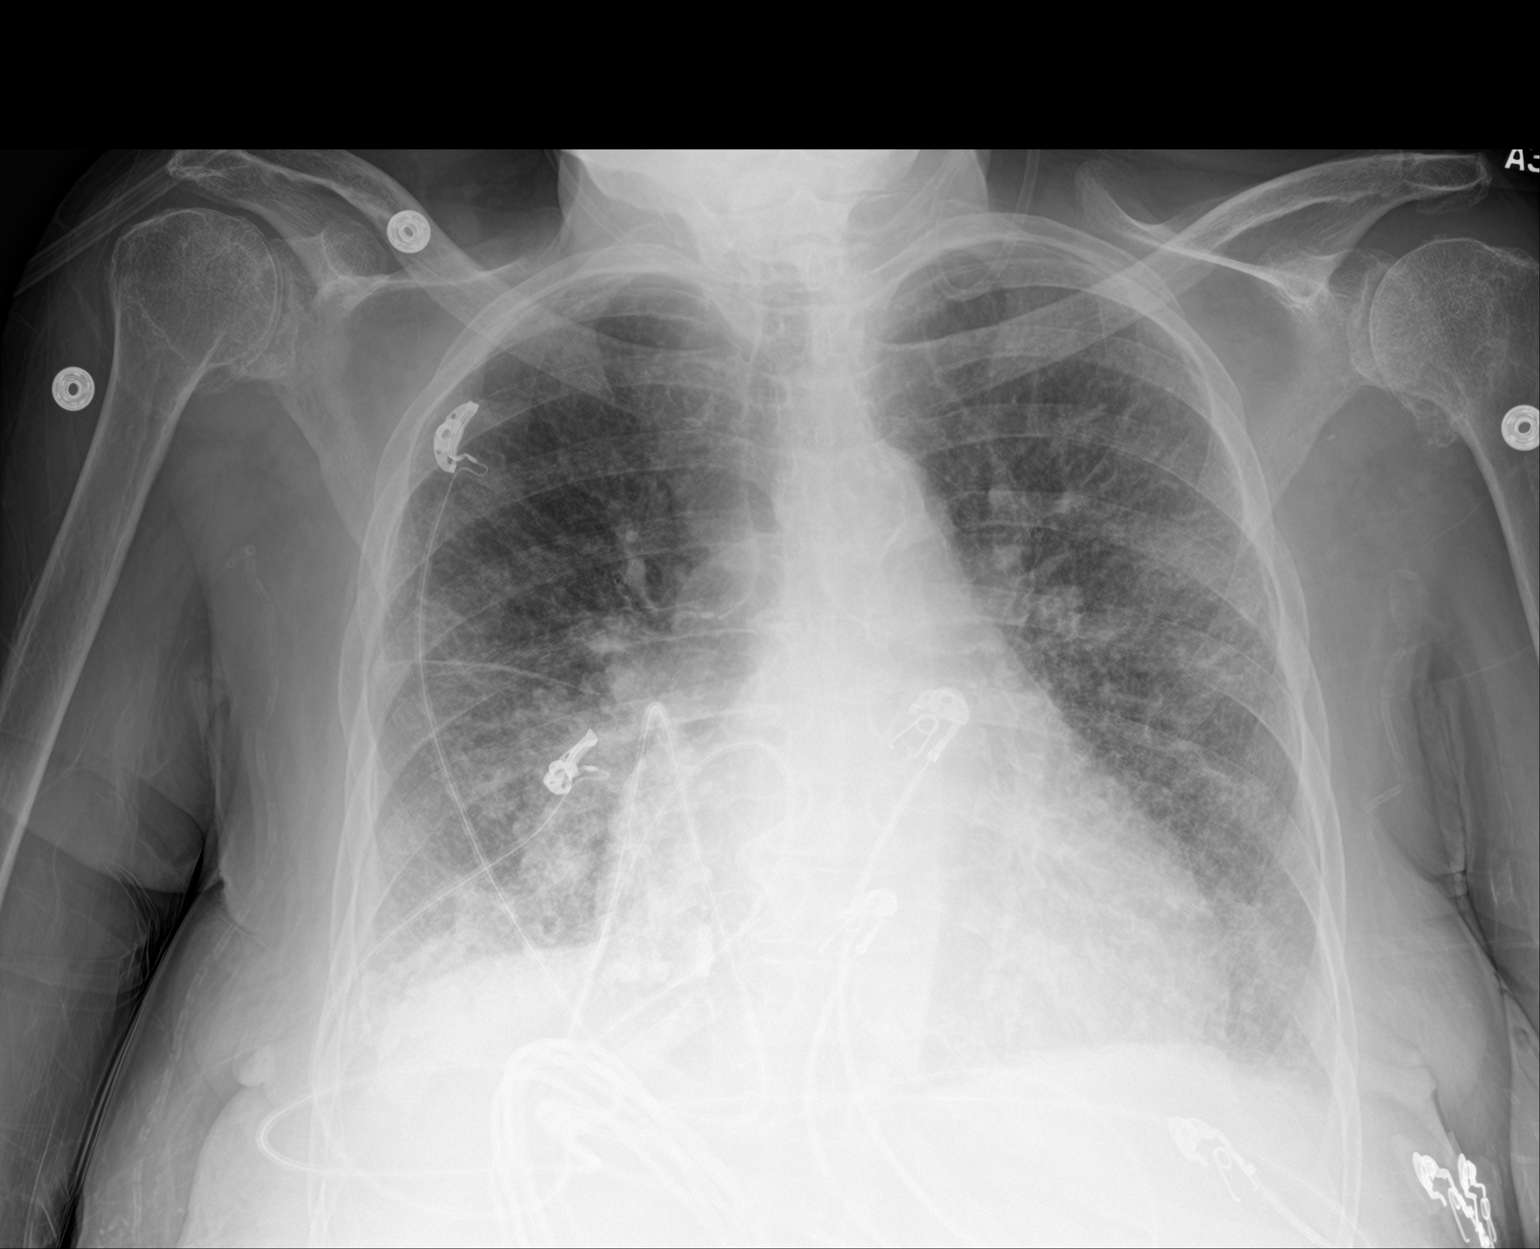

[2 of 2 positions shown; findings below may reference images not displayed]

FINDINGS: The lungs are well-aerated. Vascular congestion is noted. Increased
interstitial markings raise concern for pulmonary edema. Small
bilateral pleural effusions are suspected. Chronic parenchymal
changes are again noted at the right lung base. No pneumothorax is
seen.

The heart is borderline enlarged. No acute osseous abnormalities are
seen.
IMPRESSION: Vascular congestion and borderline cardiomegaly. Increased
interstitial markings raise concern for pulmonary edema. Small
bilateral pleural effusions suspected.

## 2019-07-04 IMAGING — DX DG CHEST 1V PORT
1 series · 1 of 1 positions shown · non-contrast
Comparison: Single-view of the chest 10/16/2017, 10/15/2017 and
12/15/2015.

CLINICAL DATA: Leukocytosis.

EXAM:
PORTABLE CHEST 1 VIEW

[chest]
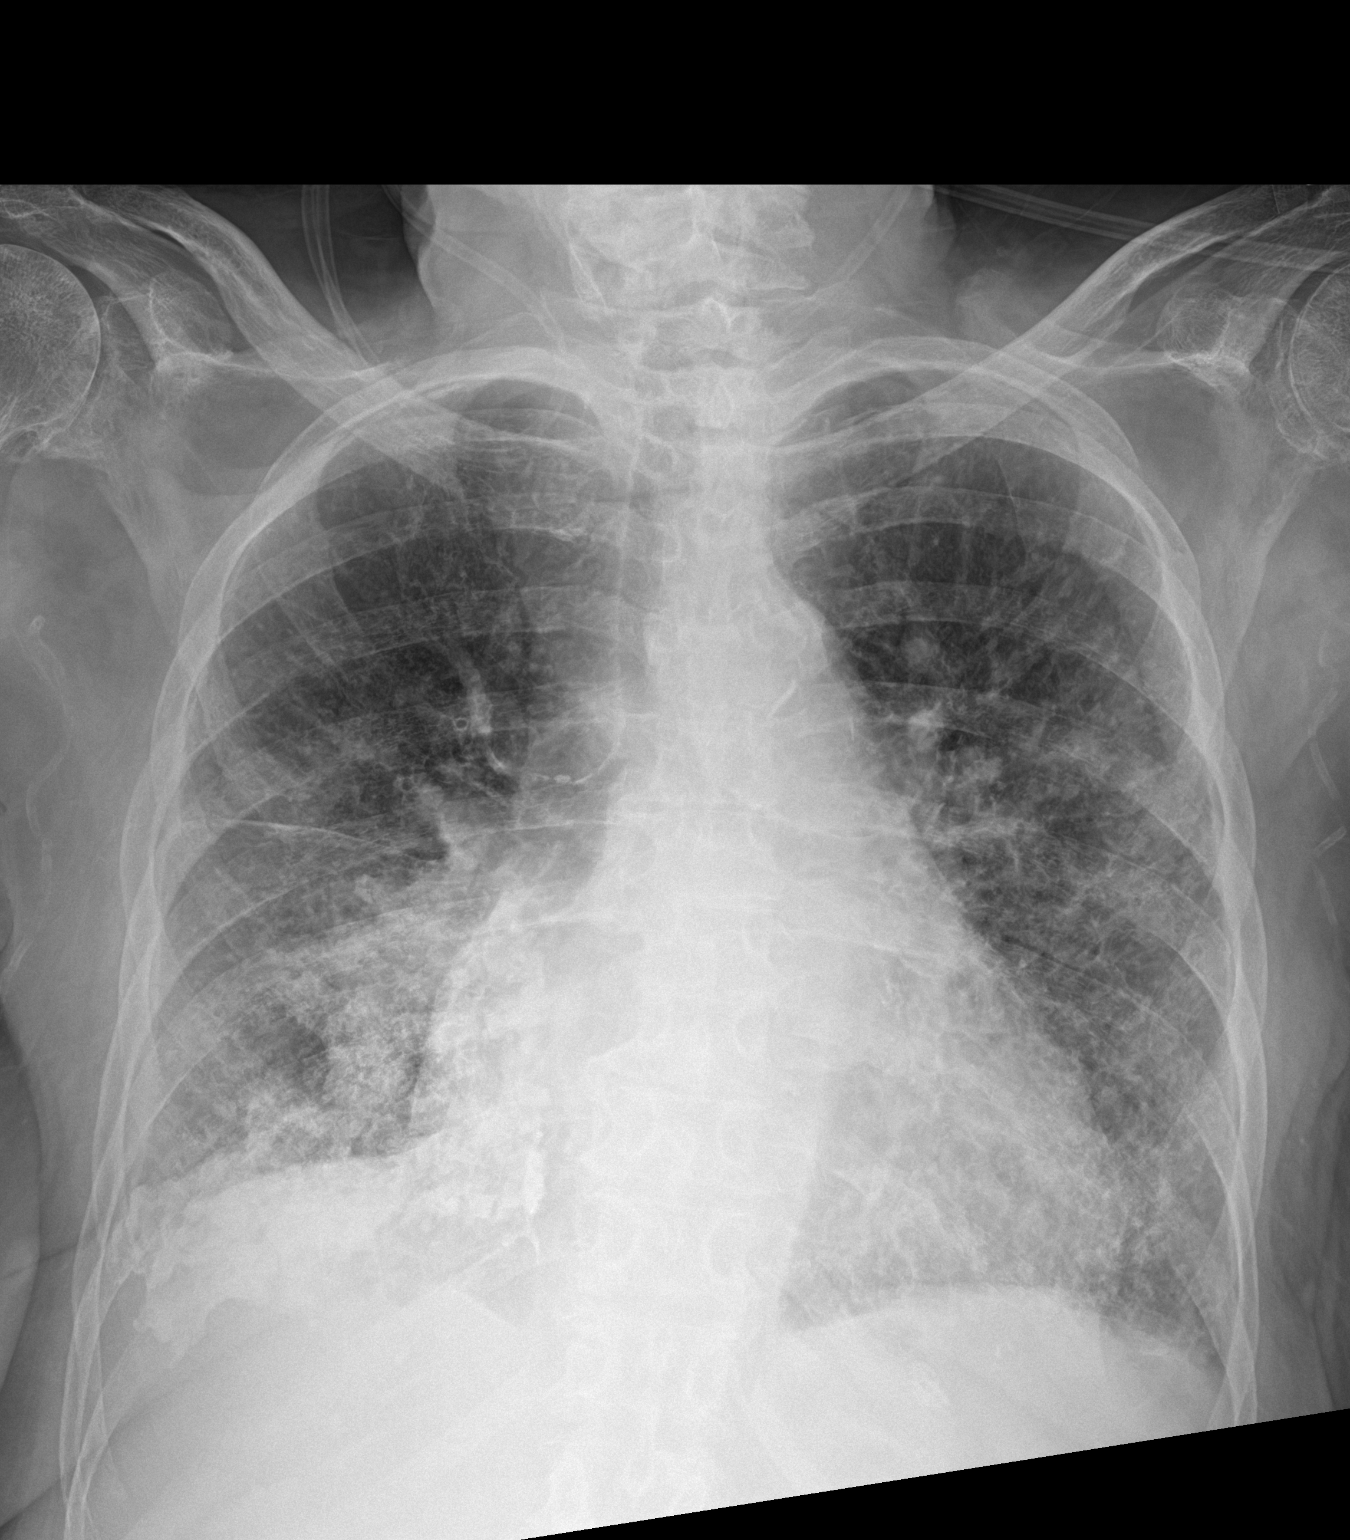

[1 of 1 positions shown; findings below may reference images not displayed]

FINDINGS: Calcified bilateral pleural plaques are again seen. Chronic
coarsening of the pulmonary interstitium bilaterally is also
identified and not notably changed since the most recent comparison
exams. There is cardiomegaly. Aortic atherosclerosis is noted. No
pneumothorax or pleural fluid.
IMPRESSION: Findings most compatible with chronic interstitial lung disease with
calcified pleural plaques identified.

Cardiomegaly.

Atherosclerosis.
